# Patient Record
Sex: Female | Born: 1980
Health system: Southern US, Community
[De-identification: ages and names within clinical notes are randomized; demographics above are authoritative.]

## PROBLEM LIST (undated history)

## (undated) ENCOUNTER — Inpatient Hospital Stay (HOSPITAL_COMMUNITY): Payer: Self-pay

## (undated) DIAGNOSIS — G8929 Other chronic pain: Secondary | ICD-10-CM

## (undated) DIAGNOSIS — I499 Cardiac arrhythmia, unspecified: Secondary | ICD-10-CM

## (undated) DIAGNOSIS — M545 Low back pain, unspecified: Secondary | ICD-10-CM

## (undated) DIAGNOSIS — J45909 Unspecified asthma, uncomplicated: Secondary | ICD-10-CM

## (undated) DIAGNOSIS — F32A Depression, unspecified: Secondary | ICD-10-CM

## (undated) DIAGNOSIS — Z9889 Other specified postprocedural states: Secondary | ICD-10-CM

## (undated) DIAGNOSIS — D759 Disease of blood and blood-forming organs, unspecified: Secondary | ICD-10-CM

## (undated) DIAGNOSIS — Z8489 Family history of other specified conditions: Secondary | ICD-10-CM

## (undated) DIAGNOSIS — D6800 Von Willebrand disease, unspecified: Secondary | ICD-10-CM

## (undated) DIAGNOSIS — J189 Pneumonia, unspecified organism: Secondary | ICD-10-CM

## (undated) DIAGNOSIS — M1990 Unspecified osteoarthritis, unspecified site: Secondary | ICD-10-CM

## (undated) DIAGNOSIS — F329 Major depressive disorder, single episode, unspecified: Secondary | ICD-10-CM

## (undated) DIAGNOSIS — K649 Unspecified hemorrhoids: Secondary | ICD-10-CM

## (undated) DIAGNOSIS — F988 Other specified behavioral and emotional disorders with onset usually occurring in childhood and adolescence: Secondary | ICD-10-CM

## (undated) DIAGNOSIS — R351 Nocturia: Secondary | ICD-10-CM

## (undated) DIAGNOSIS — R112 Nausea with vomiting, unspecified: Secondary | ICD-10-CM

## (undated) DIAGNOSIS — R51 Headache: Secondary | ICD-10-CM

## (undated) DIAGNOSIS — K219 Gastro-esophageal reflux disease without esophagitis: Secondary | ICD-10-CM

## (undated) DIAGNOSIS — R519 Headache, unspecified: Secondary | ICD-10-CM

## (undated) DIAGNOSIS — F319 Bipolar disorder, unspecified: Secondary | ICD-10-CM

## (undated) DIAGNOSIS — D649 Anemia, unspecified: Secondary | ICD-10-CM

## (undated) DIAGNOSIS — D689 Coagulation defect, unspecified: Secondary | ICD-10-CM

## (undated) DIAGNOSIS — I10 Essential (primary) hypertension: Secondary | ICD-10-CM

## (undated) DIAGNOSIS — G35 Multiple sclerosis: Secondary | ICD-10-CM

## (undated) DIAGNOSIS — F419 Anxiety disorder, unspecified: Secondary | ICD-10-CM

## (undated) HISTORY — PX: LAPAROSCOPIC GASTRIC SLEEVE RESECTION: SHX5895

## (undated) HISTORY — PX: ABDOMINAL HYSTERECTOMY: SHX81

## (undated) HISTORY — PX: COLONOSCOPY: SHX174

## (undated) HISTORY — DX: Coagulation defect, unspecified: D68.9

## (undated) HISTORY — PX: MULTIPLE TOOTH EXTRACTIONS: SHX2053

## (undated) HISTORY — DX: Essential (primary) hypertension: I10

## (undated) HISTORY — PX: DILATION AND CURETTAGE OF UTERUS: SHX78

## (undated) HISTORY — PX: LUMBAR DISC SURGERY: SHX700

## (undated) HISTORY — PX: BACK SURGERY: SHX140

## (undated) HISTORY — DX: Unspecified osteoarthritis, unspecified site: M19.90

## (undated) NOTE — *Deleted (*Deleted)
HEMATOLOGY/ONCOLOGY CLINIC NOTE  Date of Service: 02/20/2020  Patient Care Team: Courtney Paris, NP as PCP - General (Nurse Practitioner)  CHIEF COMPLAINTS/PURPOSE OF CONSULTATION:  Von Willebrand's disease  HISTORY OF PRESENTING ILLNESS:   Ann [name] is a wonderful 75 y.o. female who has been referred to [place] by Dr Courtney Paris for evaluation and management of VonWillebrand's disease type 1A. The pt reports that she is doing well overall.   The pt reports that she was recently diagnosed with Multiple Sclerosis. She was experiencing slurred speech and went to the hospital. They then put her on 5 days of steroids and her symptoms subsided. She still experiences speech issues, body aches and fatigue occasionally. Her current Neurologist is Dr. Epimenio Foot at [facility], who plans on putting her on infusion therapy.   Her mother has bleeding issues and bruises frequently and one of her mother's siblings has similar issues. Neither of her parents have been tested for Von Willebrand's disease. She was diagnosed with Von Willebrand's in 2005 due to C-section related excess bleeding. Since she was younger she had excess nose bleeds before her menstruation, which improved after her cycle was over. She began birth control soon after she began mensturating but it did not help the bleeding. She remained on birth control until she was 4. She has been pregnant 3 times and has two children, one pregnancy ended in a planned abortion. Both of her children were delivered by C-section. Both have also been tested for Von Willebrand's disease. Her daughter tested positive and her son tested negative but only blood tests were used to diagnose. Her son does have sickle cell trait. She did not have any excessive bleeding with her second C-section, gallbladder surgery or back surgery. She has not required Stimate outside of surgery nor Amicar. Pt had a D&C/ablation in 2013 and has had controlled  bleeding since. Her daughter had an extraction without Stimate and did not have any excessive bleeding issues. In 2017 pt received a laceration from a pencil sharpener, bled for 6 hours and needed stiches for her injuries. She has no upcoming surgeries.   Most recent lab results (01/01/2019) of CBC is as follows: all values are WNL except for Lymphs Abs at 3.3K, BUN/Creatinine Ratio at 8, Sodium at 150, Chloride at 109, CO2 at 18. 01/01/2019 Urinalysis shows all values are WNL except for pH at 8.5.  01/01/2019 hCG is negative.  01/01/2019 Hepatitis B Core AB is negative.  01/01/2019 IgG, IgA, IgM shows all results are WNL.   On review of systems, pt reports fatigue, body aches, speech issues and denies abdominal pain and any other symptoms.   On PMHx the pt reports two C-sections, back surgery, gallbladder surgery, D&C ablation On Social Hx the pt reports no smoking, low alcohol use  On Family Hx the pt reports no diagnosed Von Willebrand's in mother or father, daughter diagnosed with Von Willebrand's   INTERVAL HISTORY:  Ann [name] is a wonderful 31 y.o. female who is here for evaluation and management of Von Willebrand's disease. The patient's last visit with [place] was on 02/06/2019. The pt reports that she is doing well overall.  The pt reports ***  Of note since the patient's last visit, pt has had *** completed on *** with results revealing ***.  Lab results today (02/20/20) of CBC w/diff and CMP is as follows: all values are WNL except for ***.  On review of systems, pt reports *** and denies ***  and any other symptoms.   A&P: -Discussed pt labwork today, 02/20/20; *** -***  MEDICAL HISTORY:  Past Medical History:  Diagnosis Date  . ADD (attention deficit disorder)    takes Adderall daily  . Anemia   . Anxiety    takes Xanax daily as needed  . Asthma    mild case per pt  . Chronic lower back pain   . Depression    has meds prescribed but doesn't take them  .  Family history of adverse reaction to anesthesia    "Mom gets PONV too"  . Headache    "monthly" (02/27/2015)  . Hemorrhoids   . History of esophagogastroduodenoscopy (EGD)   . Migraine    "none in the last 6 months" (02/27/2015)  . Nocturia   . Pneumonia "several times"  . PONV (postoperative nausea and vomiting)   . Von Willebrand disease (HCC)    Dr. Cyndie Chime    SURGICAL HISTORY: Past Surgical History:  Procedure Laterality Date  . BACK SURGERY    . BREAST CYST EXCISION Left 02/20/2018   Procedure: EXCISION OF LEFT BREAST SEBACEOUS CYST;  Surgeon: Emelia Loron, MD;  Location: [facility];  Service: General;  Laterality: Left;  . BREAST REDUCTION SURGERY Bilateral 02/27/2015   Procedure: BILATERAL BREAST REDUCTION WITH FREE NIPPLE GRAFT TECHNIQUE ON RIGHT BREAST;  Surgeon: Etter Sjogren, MD;  Location: [facility] OR;  Service: Plastics;  Laterality: Bilateral;  . CESAREAN SECTION  2004; 2013  . CHOLECYSTECTOMY  03/02/2012   Procedure: LAPAROSCOPIC CHOLECYSTECTOMY WITH INTRAOPERATIVE CHOLANGIOGRAM;  Surgeon: Currie Paris, MD;  Location: [facility] OR;  Service: General;  Laterality: N/A;  . DILATION AND CURETTAGE OF UTERUS  ~ 1997; ~ 2005  . DILITATION & CURRETTAGE/HYSTROSCOPY WITH THERMACHOICE ABLATION  04/28/2012   Procedure: DILATATION & CURETTAGE/HYSTEROSCOPY WITH THERMACHOICE ABLATION;  Surgeon: Jeani Hawking, MD;  Location: [facility];  Service: Gynecology;  Laterality: N/A;  . ERCP  03/03/2012   Procedure: ENDOSCOPIC RETROGRADE CHOLANGIOPANCREATOGRAPHY (ERCP);  Surgeon: Theda Belfast, MD;  Location: [facility];  Service: Endoscopy;  Laterality: N/A;  dl/hung  . LAPAROSCOPY  2011  . LUMBAR DISC SURGERY  2003; 2008; 2009  . MULTIPLE TOOTH EXTRACTIONS  "scattered dates"  . POSTERIOR LUMBAR FUSION  2010  . REDUCTION MAMMAPLASTY Bilateral 02/27/2015  . TUBAL LIGATION  2013  . WISDOM TOOTH EXTRACTION  2001    SOCIAL HISTORY: Social History   Socioeconomic  History  . Marital status: Married    Spouse name: Not on file  . Number of children: Not on file  . Years of education: Not on file  . Highest education level: Not on file  Occupational History  . Occupation: regulatory affairs  Tobacco Use  . Smoking status: Former Smoker    Packs/day: 0.25    Years: 2.00    Pack years: 0.50    Types: Cigarettes    Quit date: 2000    Years since quitting: 21.7  . Smokeless tobacco: Never Used  Substance and Sexual Activity  . Alcohol use: Yes    Alcohol/week: 0.0 standard drinks    Comment: social  . Drug use: Yes    Types: Marijuana    Comment: occasional use, last use last week  . Sexual activity: Yes    Birth control/protection: None    Comment: BTL  Other Topics Concern  . Not on file  Social History Narrative   Lives with husband and kids   Caffeine use:    16 oz per  day   Right handed   Social Determinants of Health   Financial Resource Strain:   . Difficulty of Paying Living Expenses: Not on file  Food Insecurity:   . Worried About Programme researcher, broadcasting/film/video in the Last Year: Not on file  . Ran Out of Food in the Last Year: Not on file  Transportation Needs:   . Lack of Transportation (Medical): Not on file  . Lack of Transportation (Non-Medical): Not on file  Physical Activity:   . Days of Exercise per Week: Not on file  . Minutes of Exercise per Session: Not on file  Stress:   . Feeling of Stress : Not on file  Social Connections:   . Frequency of Communication with Friends and Family: Not on file  . Frequency of Social Gatherings with Friends and Family: Not on file  . Attends Religious Services: Not on file  . Active Member of Clubs or Organizations: Not on file  . Attends Banker Meetings: Not on file  . Marital Status: Not on file  Intimate Partner Violence:   . Fear of Current or Ex-Partner: Not on file  . Emotionally Abused: Not on file  . Physically Abused: Not on file  . Sexually Abused: Not on  file    FAMILY HISTORY: Family History  Problem Relation Age of Onset  . Stroke Mother   . Arthritis Mother   . Depression Mother   . COPD Father   . Kidney disease Paternal Uncle   . Stroke Maternal Grandfather   . Heart disease Paternal Grandfather   . Multiple sclerosis Neg Hx     ALLERGIES:  is allergic to compazine [prochlorperazine edisylate], darvocet [propoxyphene n-acetaminophen], codeine, and sulfa antibiotics.  MEDICATIONS:  Current Outpatient Medications  Medication Sig Dispense Refill  . ALPRAZolam (XANAX) 1 MG tablet Take 0.5 mg by mouth 2 (two) times daily as needed for anxiety.     . Armodafinil 200 MG TABS One po qAM 30 tablet 3  . baclofen (LIORESAL) 10 MG tablet 1/2-1 tablet by mouth three times daily 90 tablet 5  . BIOTIN PO Take 1 Dose by mouth daily.    . Cyanocobalamin (B-12 PO) Take 1 Dose by mouth daily.    Marland Kitchen FLUoxetine (PROZAC) 40 MG capsule Take 40 mg by mouth at bedtime.    . hydrOXYzine (ATARAX/VISTARIL) 10 MG tablet Take 1 tablet (10 mg total) by mouth 3 (three) times daily as needed. 90 tablet 5  . meloxicam (MOBIC) 7.5 MG tablet Take 1 tablet (7.5 mg total) by mouth daily. 30 tablet 5  . phentermine (ADIPEX-P) 37.5 MG tablet Take 1 tablet (37.5 mg total) by mouth daily before breakfast. 30 tablet 5  . Vitamin D, Ergocalciferol, (DRISDOL) 1.25 MG (50000 UT) CAPS capsule Take 50,000 Units by mouth every 7 (seven) days.     No current facility-administered medications for this visit.    REVIEW OF SYSTEMS:   A 10+ POINT REVIEW OF SYSTEMS WAS OBTAINED including neurology, dermatology, psychiatry, cardiac, respiratory, lymph, extremities, GI, GU, Musculoskeletal, constitutional, breasts, reproductive, HEENT.  All pertinent positives are noted in the HPI.  All others are negative.   PHYSICAL EXAMINATION: ECOG PERFORMANCE STATUS: 1 - Symptomatic but completely ambulatory  . There were no vitals filed for this visit. There were no vitals filed for  this visit. .There is no height or weight on file to calculate BMI.  *** GENERAL:alert, in no acute distress and comfortable SKIN: no acute rashes, no significant  lesions EYES: conjunctiva are pink and non-injected, sclera anicteric OROPHARYNX: MMM, no exudates, no oropharyngeal erythema or ulceration NECK: supple, no JVD LYMPH:  no palpable lymphadenopathy in the cervical, axillary or inguinal regions LUNGS: clear to auscultation b/l with normal respiratory effort HEART: regular rate & rhythm ABDOMEN:  normoactive bowel sounds , non tender, not distended. No palpable hepatosplenomegaly.  Extremity: no pedal edema PSYCH: alert & oriented x 3 with fluent speech NEURO: no focal motor/sensory deficits  LABORATORY DATA:  I have reviewed the data as listed  . CBC Latest Ref Rng & Units 01/16/2019 01/01/2019 12/12/2018  WBC 4.0 - 10.5 K/uL 7.9 9.0 -  Hemoglobin 12.0 - 15.0 g/dL 16.1 09.6 04.5  Hematocrit 36 - 46 % 38.5 37.0 42.0  Platelets 150 - 400 K/uL 319 250 -    . CMP Latest Ref Rng & Units 01/16/2019 01/01/2019 12/12/2018  Glucose 70 - 99 mg/dL 92 88 90  BUN 6 - 20 mg/dL 8 7 7   Creatinine 0.44 - 1.00 mg/dL 4.09 8.11 9.14  Sodium 135 - 145 mmol/L 140 150(H) 141  Potassium 3.5 - 5.1 mmol/L 4.0 4.5 3.7  Chloride 98 - 111 mmol/L 106 109(H) 106  CO2 22 - 32 mmol/L 26 18(L) -  Calcium 8.9 - 10.3 mg/dL 9.5 78.2 -  Total Protein 6.5 - 8.1 g/dL 7.6 7.6 -  Total Bilirubin 0.3 - 1.2 mg/dL 0.4 <9.5 -  Alkaline Phos 38 - 126 U/L 59 75 -  AST 15 - 41 U/L 15 17 -  ALT 0 - 44 U/L 14 19 -     RADIOGRAPHIC STUDIES: I have personally reviewed the radiological images as listed and agreed with the findings in the report. MR BRAIN W WO CONTRAST  Result Date: 02/06/2020  [facility] [address], [address] [place], [place] [address] [phone] NEUROIMAGING REPORT STUDY DATE: 02/05/2020 PATIENT NAME: Ann [name] DOB: 1980/06/18 MRN: [id number] EXAM: MRI Brain with and without  contrast  ORDERING CLINICIAN: Richard A. Epimenio Foot, MD. PhD CLINICAL HISTORY:84 year old woman with multiple sclerosis in the CHIMES study COMPARISON FILMS: MRI 08/16/2019  TECHNIQUE:MRI of the brain with and without contrast was obtained utilizing the CHIMES MS protocol CONTRAST: 20 mL ProHance IMAGING SITE: [employer], [address] [place].  FINDINGS: The contents of the posterior fossa are of normal size and position. The pituitary gland and optic chiasm appear normal. Visible spinal cord appears normal.Brain volume appears normal. The ventricles are normal in size and without distortion. There are no abnormal extra-axial collections of fluid.   In the hemispheres, there are multiple T2/FLAIR hyperintense foci in the periventricular and deep white matter.  Wallerian degeneration is seen along the left corticospinal tract.  The cerebellum and brainstem appears normal. The deep gray matter appears normal. None of the foci appears to be acute and they do not enhance. There are no new lesions compared to the 08/16/2019 MRI.   The orbits appear normal. The VIIth/VIIIth nerve complex appears normal. The mastoid air cells appear normal. The paranasal sinuses appear normal. Flow voids are identified within the major intracerebral arteries.   After the infusion of contrast material, a normal enhancement pattern is noted.  Additional images cover the spinal cord on sagittal views.  No foci are noted.  No enhancing lesions.  Disc protrusion at T1-T2.  There are no changes compared to the 08/16/2019 MRI of the cervical spine.  IMPRESSION: This MRI of the brain with and without contrast performed with the CHIMES MS  study protocol shows the following: 1.     There are multiple T2/FLAIR hyperintense foci in the hemispheres in a pattern and configuration consistent with chronic demyelinating plaque associated with multiple sclerosis.    Additionally, wallerian degeneration is seen along the left  corticospinal tract.  Compared to the MRI from 08/16/2019, there are no new lesions.  The foci do not enhance.  2.   There is a normal enhancement pattern and no acute findings. INTERPRETING PHYSICIAN: Richard A. Epimenio Foot, MD, PhD, FAAN Certified in  Neuroimaging by [employer]    ASSESSMENT & PLAN:    69 yo with h/o VWD type 1 and recently diagnosed MS  1) Von Willebrand disease  -Most likely Type 2 (M or N) Von Willebrand disease based on labs ***  PLAN: *** -Recommended avoidance of OTC pain medications, NSAIDS, alcohol use due to increased bleeding risks. -Discussed that certain MS medications could decrease platelet counts. -Has been a stimate responder in the past and has needed this with previous C-section and gallbladder surgery. -Amicar with dental procedures   FOLLOW UP: ***   The total time spent in the appt was *** minutes and more than 50% was on counseling and direct patient cares.  All of the patient's questions were answered with apparent satisfaction. The patient knows to call the clinic with any problems, questions or concerns.    Wyvonnia Lora MD MS AAHIVMS [facility] Hematology/Oncology Physician [facility]  (Office):       [phone] (Work cell):  [phone] (Fax):           [phone]  02/20/2020 8:04 AM  I, Carollee Herter, am acting as a scribe for Dr. Wyvonnia Lora.   {Add Production assistant, radio Statement}

---

## 1998-01-16 ENCOUNTER — Emergency Department (HOSPITAL_COMMUNITY): Admission: EM | Admit: 1998-01-16 | Discharge: 1998-01-16 | Payer: Self-pay | Admitting: Emergency Medicine

## 1998-03-29 ENCOUNTER — Emergency Department (HOSPITAL_COMMUNITY): Admission: EM | Admit: 1998-03-29 | Discharge: 1998-03-29 | Payer: Self-pay | Admitting: Emergency Medicine

## 1999-03-02 ENCOUNTER — Emergency Department (HOSPITAL_COMMUNITY): Admission: EM | Admit: 1999-03-02 | Discharge: 1999-03-02 | Payer: Self-pay | Admitting: Family Medicine

## 1999-05-11 HISTORY — PX: WISDOM TOOTH EXTRACTION: SHX21

## 2000-11-06 ENCOUNTER — Emergency Department (HOSPITAL_COMMUNITY): Admission: EM | Admit: 2000-11-06 | Discharge: 2000-11-06 | Payer: Self-pay | Admitting: Emergency Medicine

## 2001-02-15 ENCOUNTER — Encounter: Payer: Self-pay | Admitting: Emergency Medicine

## 2001-02-15 ENCOUNTER — Emergency Department (HOSPITAL_COMMUNITY): Admission: EM | Admit: 2001-02-15 | Discharge: 2001-02-16 | Payer: Self-pay | Admitting: Emergency Medicine

## 2001-06-07 ENCOUNTER — Emergency Department (HOSPITAL_COMMUNITY): Admission: EM | Admit: 2001-06-07 | Discharge: 2001-06-07 | Payer: Self-pay | Admitting: Emergency Medicine

## 2001-06-14 ENCOUNTER — Emergency Department (HOSPITAL_COMMUNITY): Admission: EM | Admit: 2001-06-14 | Discharge: 2001-06-14 | Payer: Self-pay | Admitting: *Deleted

## 2001-06-21 ENCOUNTER — Ambulatory Visit (HOSPITAL_COMMUNITY): Admission: RE | Admit: 2001-06-21 | Discharge: 2001-06-21 | Payer: Self-pay

## 2001-06-21 ENCOUNTER — Encounter: Payer: Self-pay | Admitting: Family Medicine

## 2001-07-18 ENCOUNTER — Encounter: Admission: RE | Admit: 2001-07-18 | Discharge: 2001-07-18 | Payer: Self-pay | Admitting: Neurosurgery

## 2001-07-18 ENCOUNTER — Encounter: Payer: Self-pay | Admitting: Neurosurgery

## 2001-08-01 ENCOUNTER — Encounter: Payer: Self-pay | Admitting: Neurosurgery

## 2001-08-01 ENCOUNTER — Encounter: Admission: RE | Admit: 2001-08-01 | Discharge: 2001-08-01 | Payer: Self-pay | Admitting: Neurosurgery

## 2001-09-12 ENCOUNTER — Inpatient Hospital Stay (HOSPITAL_COMMUNITY): Admission: RE | Admit: 2001-09-12 | Discharge: 2001-09-16 | Payer: Self-pay | Admitting: Neurosurgery

## 2001-09-12 ENCOUNTER — Encounter: Payer: Self-pay | Admitting: Neurosurgery

## 2001-12-15 ENCOUNTER — Encounter: Admission: RE | Admit: 2001-12-15 | Discharge: 2002-01-25 | Payer: Self-pay | Admitting: Neurosurgery

## 2002-05-22 ENCOUNTER — Encounter: Admission: RE | Admit: 2002-05-22 | Discharge: 2002-08-20 | Payer: Self-pay

## 2002-07-06 ENCOUNTER — Other Ambulatory Visit: Admission: RE | Admit: 2002-07-06 | Discharge: 2002-07-06 | Payer: Self-pay | Admitting: Obstetrics and Gynecology

## 2002-11-07 ENCOUNTER — Encounter
Admission: RE | Admit: 2002-11-07 | Discharge: 2003-02-05 | Payer: Self-pay | Admitting: Physical Medicine & Rehabilitation

## 2002-12-25 ENCOUNTER — Inpatient Hospital Stay (HOSPITAL_COMMUNITY): Admission: AD | Admit: 2002-12-25 | Discharge: 2002-12-25 | Payer: Self-pay | Admitting: Obstetrics and Gynecology

## 2002-12-25 ENCOUNTER — Encounter: Payer: Self-pay | Admitting: Obstetrics and Gynecology

## 2003-01-08 ENCOUNTER — Inpatient Hospital Stay (HOSPITAL_COMMUNITY): Admission: AD | Admit: 2003-01-08 | Discharge: 2003-01-08 | Payer: Self-pay | Admitting: Obstetrics and Gynecology

## 2003-01-29 ENCOUNTER — Inpatient Hospital Stay (HOSPITAL_COMMUNITY): Admission: AD | Admit: 2003-01-29 | Discharge: 2003-01-29 | Payer: Self-pay | Admitting: Obstetrics and Gynecology

## 2003-03-11 ENCOUNTER — Inpatient Hospital Stay (HOSPITAL_COMMUNITY): Admission: AD | Admit: 2003-03-11 | Discharge: 2003-03-12 | Payer: Self-pay | Admitting: Obstetrics and Gynecology

## 2003-03-19 ENCOUNTER — Inpatient Hospital Stay (HOSPITAL_COMMUNITY): Admission: AD | Admit: 2003-03-19 | Discharge: 2003-03-19 | Payer: Self-pay | Admitting: Obstetrics and Gynecology

## 2003-03-20 ENCOUNTER — Inpatient Hospital Stay (HOSPITAL_COMMUNITY): Admission: AD | Admit: 2003-03-20 | Discharge: 2003-03-20 | Payer: Self-pay | Admitting: Obstetrics and Gynecology

## 2003-03-21 ENCOUNTER — Inpatient Hospital Stay (HOSPITAL_COMMUNITY): Admission: AD | Admit: 2003-03-21 | Discharge: 2003-03-26 | Payer: Self-pay | Admitting: Obstetrics and Gynecology

## 2003-03-28 ENCOUNTER — Inpatient Hospital Stay (HOSPITAL_COMMUNITY): Admission: AD | Admit: 2003-03-28 | Discharge: 2003-03-28 | Payer: Self-pay | Admitting: Obstetrics and Gynecology

## 2003-04-03 ENCOUNTER — Inpatient Hospital Stay (HOSPITAL_COMMUNITY): Admission: AD | Admit: 2003-04-03 | Discharge: 2003-04-03 | Payer: Self-pay | Admitting: Obstetrics and Gynecology

## 2003-05-11 ENCOUNTER — Inpatient Hospital Stay (HOSPITAL_COMMUNITY): Admission: AD | Admit: 2003-05-11 | Discharge: 2003-05-11 | Payer: Self-pay | Admitting: Obstetrics and Gynecology

## 2003-05-15 ENCOUNTER — Encounter
Admission: RE | Admit: 2003-05-15 | Discharge: 2003-08-13 | Payer: Self-pay | Admitting: Physical Medicine & Rehabilitation

## 2003-06-26 ENCOUNTER — Emergency Department (HOSPITAL_COMMUNITY): Admission: EM | Admit: 2003-06-26 | Discharge: 2003-06-26 | Payer: Self-pay | Admitting: Emergency Medicine

## 2003-07-26 ENCOUNTER — Encounter
Admission: RE | Admit: 2003-07-26 | Discharge: 2003-10-24 | Payer: Self-pay | Admitting: Physical Medicine & Rehabilitation

## 2003-08-22 ENCOUNTER — Encounter
Admission: RE | Admit: 2003-08-22 | Discharge: 2003-11-20 | Payer: Self-pay | Admitting: Physical Medicine & Rehabilitation

## 2003-11-18 ENCOUNTER — Emergency Department (HOSPITAL_COMMUNITY): Admission: EM | Admit: 2003-11-18 | Discharge: 2003-11-18 | Payer: Self-pay | Admitting: Family Medicine

## 2003-11-26 ENCOUNTER — Emergency Department (HOSPITAL_COMMUNITY): Admission: EM | Admit: 2003-11-26 | Discharge: 2003-11-26 | Payer: Self-pay | Admitting: Family Medicine

## 2003-12-12 ENCOUNTER — Encounter
Admission: RE | Admit: 2003-12-12 | Discharge: 2004-02-03 | Payer: Self-pay | Admitting: Physical Medicine & Rehabilitation

## 2004-01-29 ENCOUNTER — Emergency Department (HOSPITAL_COMMUNITY): Admission: EM | Admit: 2004-01-29 | Discharge: 2004-01-29 | Payer: Self-pay | Admitting: Emergency Medicine

## 2004-01-30 ENCOUNTER — Emergency Department (HOSPITAL_COMMUNITY): Admission: EM | Admit: 2004-01-30 | Discharge: 2004-01-30 | Payer: Self-pay | Admitting: *Deleted

## 2004-02-01 ENCOUNTER — Emergency Department (HOSPITAL_COMMUNITY): Admission: EM | Admit: 2004-02-01 | Discharge: 2004-02-02 | Payer: Self-pay | Admitting: Emergency Medicine

## 2004-02-03 ENCOUNTER — Encounter
Admission: RE | Admit: 2004-02-03 | Discharge: 2004-05-03 | Payer: Self-pay | Admitting: Physical Medicine & Rehabilitation

## 2004-02-21 ENCOUNTER — Encounter: Admission: RE | Admit: 2004-02-21 | Discharge: 2004-03-13 | Payer: Self-pay | Admitting: Obstetrics and Gynecology

## 2004-02-25 ENCOUNTER — Ambulatory Visit: Payer: Self-pay | Admitting: Physical Medicine & Rehabilitation

## 2004-05-12 ENCOUNTER — Encounter
Admission: RE | Admit: 2004-05-12 | Discharge: 2004-08-10 | Payer: Self-pay | Admitting: Physical Medicine & Rehabilitation

## 2004-05-14 ENCOUNTER — Ambulatory Visit: Payer: Self-pay | Admitting: Physical Medicine & Rehabilitation

## 2004-05-25 ENCOUNTER — Emergency Department (HOSPITAL_COMMUNITY): Admission: EM | Admit: 2004-05-25 | Discharge: 2004-05-25 | Payer: Self-pay | Admitting: Family Medicine

## 2004-07-06 ENCOUNTER — Inpatient Hospital Stay (HOSPITAL_COMMUNITY): Admission: AD | Admit: 2004-07-06 | Discharge: 2004-07-06 | Payer: Self-pay | Admitting: Obstetrics and Gynecology

## 2004-07-06 ENCOUNTER — Encounter: Admission: RE | Admit: 2004-07-06 | Discharge: 2004-10-04 | Payer: Self-pay | Admitting: Obstetrics and Gynecology

## 2004-07-08 ENCOUNTER — Inpatient Hospital Stay (HOSPITAL_COMMUNITY): Admission: AD | Admit: 2004-07-08 | Discharge: 2004-07-08 | Payer: Self-pay | Admitting: Obstetrics and Gynecology

## 2004-09-29 ENCOUNTER — Other Ambulatory Visit: Admission: RE | Admit: 2004-09-29 | Discharge: 2004-09-29 | Payer: Self-pay | Admitting: Obstetrics and Gynecology

## 2004-10-10 ENCOUNTER — Emergency Department (HOSPITAL_COMMUNITY): Admission: EM | Admit: 2004-10-10 | Discharge: 2004-10-10 | Payer: Self-pay | Admitting: Emergency Medicine

## 2004-10-15 ENCOUNTER — Ambulatory Visit (HOSPITAL_COMMUNITY): Admission: RE | Admit: 2004-10-15 | Discharge: 2004-10-15 | Payer: Self-pay | Admitting: Family Medicine

## 2004-10-15 ENCOUNTER — Encounter (INDEPENDENT_AMBULATORY_CARE_PROVIDER_SITE_OTHER): Payer: Self-pay | Admitting: *Deleted

## 2004-10-15 ENCOUNTER — Ambulatory Visit (HOSPITAL_COMMUNITY): Admission: RE | Admit: 2004-10-15 | Discharge: 2004-10-15 | Payer: Self-pay | Admitting: Vascular Surgery

## 2004-10-18 ENCOUNTER — Inpatient Hospital Stay (HOSPITAL_COMMUNITY): Admission: EM | Admit: 2004-10-18 | Discharge: 2004-10-26 | Payer: Self-pay | Admitting: Emergency Medicine

## 2004-10-23 ENCOUNTER — Encounter: Payer: Self-pay | Admitting: Internal Medicine

## 2004-12-22 ENCOUNTER — Emergency Department (HOSPITAL_COMMUNITY): Admission: EM | Admit: 2004-12-22 | Discharge: 2004-12-22 | Payer: Self-pay | Admitting: Emergency Medicine

## 2004-12-23 ENCOUNTER — Inpatient Hospital Stay (HOSPITAL_COMMUNITY): Admission: AD | Admit: 2004-12-23 | Discharge: 2004-12-23 | Payer: Self-pay | Admitting: Obstetrics and Gynecology

## 2005-01-26 ENCOUNTER — Emergency Department (HOSPITAL_COMMUNITY): Admission: EM | Admit: 2005-01-26 | Discharge: 2005-01-27 | Payer: Self-pay | Admitting: Emergency Medicine

## 2005-01-27 ENCOUNTER — Emergency Department (HOSPITAL_COMMUNITY): Admission: EM | Admit: 2005-01-27 | Discharge: 2005-01-27 | Payer: Self-pay | Admitting: Family Medicine

## 2005-05-19 ENCOUNTER — Emergency Department (HOSPITAL_COMMUNITY): Admission: EM | Admit: 2005-05-19 | Discharge: 2005-05-19 | Payer: Self-pay | Admitting: Family Medicine

## 2005-06-24 ENCOUNTER — Other Ambulatory Visit: Admission: RE | Admit: 2005-06-24 | Discharge: 2005-06-24 | Payer: Self-pay | Admitting: Obstetrics and Gynecology

## 2005-07-03 ENCOUNTER — Emergency Department (HOSPITAL_COMMUNITY): Admission: EM | Admit: 2005-07-03 | Discharge: 2005-07-03 | Payer: Self-pay | Admitting: Family Medicine

## 2005-11-11 ENCOUNTER — Emergency Department (HOSPITAL_COMMUNITY): Admission: EM | Admit: 2005-11-11 | Discharge: 2005-11-11 | Payer: Self-pay | Admitting: Emergency Medicine

## 2005-12-22 ENCOUNTER — Ambulatory Visit (HOSPITAL_COMMUNITY): Admission: RE | Admit: 2005-12-22 | Discharge: 2005-12-22 | Payer: Self-pay | Admitting: Emergency Medicine

## 2006-04-27 ENCOUNTER — Other Ambulatory Visit: Admission: RE | Admit: 2006-04-27 | Discharge: 2006-04-27 | Payer: Self-pay | Admitting: Obstetrics and Gynecology

## 2006-08-18 ENCOUNTER — Ambulatory Visit (HOSPITAL_COMMUNITY): Admission: RE | Admit: 2006-08-18 | Discharge: 2006-08-18 | Payer: Self-pay | Admitting: Gastroenterology

## 2006-09-06 ENCOUNTER — Emergency Department (HOSPITAL_COMMUNITY): Admission: EM | Admit: 2006-09-06 | Discharge: 2006-09-06 | Payer: Self-pay | Admitting: Emergency Medicine

## 2006-11-02 ENCOUNTER — Other Ambulatory Visit: Admission: RE | Admit: 2006-11-02 | Discharge: 2006-11-02 | Payer: Self-pay | Admitting: Obstetrics and Gynecology

## 2006-11-22 ENCOUNTER — Emergency Department (HOSPITAL_COMMUNITY): Admission: EM | Admit: 2006-11-22 | Discharge: 2006-11-22 | Payer: Self-pay | Admitting: Emergency Medicine

## 2006-11-24 ENCOUNTER — Emergency Department (HOSPITAL_COMMUNITY): Admission: EM | Admit: 2006-11-24 | Discharge: 2006-11-25 | Payer: Self-pay | Admitting: Emergency Medicine

## 2006-11-29 ENCOUNTER — Ambulatory Visit (HOSPITAL_COMMUNITY): Admission: RE | Admit: 2006-11-29 | Discharge: 2006-11-29 | Payer: Self-pay | Admitting: Family Medicine

## 2006-12-07 ENCOUNTER — Encounter: Admission: RE | Admit: 2006-12-07 | Discharge: 2006-12-07 | Payer: Self-pay | Admitting: Family Medicine

## 2007-01-17 ENCOUNTER — Emergency Department (HOSPITAL_COMMUNITY): Admission: EM | Admit: 2007-01-17 | Discharge: 2007-01-17 | Payer: Self-pay | Admitting: Emergency Medicine

## 2007-02-17 ENCOUNTER — Ambulatory Visit (HOSPITAL_COMMUNITY): Payer: Self-pay | Admitting: Psychiatry

## 2007-02-27 ENCOUNTER — Emergency Department (HOSPITAL_COMMUNITY): Admission: EM | Admit: 2007-02-27 | Discharge: 2007-02-27 | Payer: Self-pay | Admitting: Emergency Medicine

## 2007-03-08 ENCOUNTER — Ambulatory Visit (HOSPITAL_COMMUNITY): Admission: RE | Admit: 2007-03-08 | Discharge: 2007-03-08 | Payer: Self-pay | Admitting: Family Medicine

## 2007-03-21 ENCOUNTER — Emergency Department (HOSPITAL_COMMUNITY): Admission: EM | Admit: 2007-03-21 | Discharge: 2007-03-21 | Payer: Self-pay | Admitting: Podiatry

## 2007-03-23 ENCOUNTER — Ambulatory Visit (HOSPITAL_COMMUNITY): Payer: Self-pay | Admitting: Psychiatry

## 2007-04-18 ENCOUNTER — Emergency Department (HOSPITAL_COMMUNITY): Admission: EM | Admit: 2007-04-18 | Discharge: 2007-04-18 | Payer: Self-pay | Admitting: Emergency Medicine

## 2007-04-20 ENCOUNTER — Inpatient Hospital Stay (HOSPITAL_COMMUNITY): Admission: RE | Admit: 2007-04-20 | Discharge: 2007-04-29 | Payer: Self-pay | Admitting: Neurosurgery

## 2007-05-07 ENCOUNTER — Inpatient Hospital Stay (HOSPITAL_COMMUNITY): Admission: EM | Admit: 2007-05-07 | Discharge: 2007-05-09 | Payer: Self-pay | Admitting: Emergency Medicine

## 2007-05-13 ENCOUNTER — Ambulatory Visit: Payer: Self-pay | Admitting: Oncology

## 2007-05-13 ENCOUNTER — Inpatient Hospital Stay (HOSPITAL_COMMUNITY): Admission: EM | Admit: 2007-05-13 | Discharge: 2007-05-20 | Payer: Self-pay | Admitting: Emergency Medicine

## 2007-05-28 ENCOUNTER — Emergency Department (HOSPITAL_COMMUNITY): Admission: EM | Admit: 2007-05-28 | Discharge: 2007-05-28 | Payer: Self-pay | Admitting: Emergency Medicine

## 2007-05-29 ENCOUNTER — Inpatient Hospital Stay (HOSPITAL_COMMUNITY): Admission: EM | Admit: 2007-05-29 | Discharge: 2007-06-10 | Payer: Self-pay | Admitting: *Deleted

## 2007-06-11 ENCOUNTER — Emergency Department (HOSPITAL_COMMUNITY): Admission: EM | Admit: 2007-06-11 | Discharge: 2007-06-11 | Payer: Self-pay | Admitting: Emergency Medicine

## 2007-06-12 ENCOUNTER — Emergency Department (HOSPITAL_COMMUNITY): Admission: EM | Admit: 2007-06-12 | Discharge: 2007-06-13 | Payer: Self-pay | Admitting: Emergency Medicine

## 2007-06-26 ENCOUNTER — Other Ambulatory Visit: Admission: RE | Admit: 2007-06-26 | Discharge: 2007-06-26 | Payer: Self-pay | Admitting: Gynecology

## 2007-07-22 ENCOUNTER — Emergency Department (HOSPITAL_COMMUNITY): Admission: EM | Admit: 2007-07-22 | Discharge: 2007-07-22 | Payer: Self-pay | Admitting: Emergency Medicine

## 2007-07-23 ENCOUNTER — Other Ambulatory Visit: Payer: Self-pay | Admitting: Gynecology

## 2007-07-24 ENCOUNTER — Emergency Department (HOSPITAL_COMMUNITY): Admission: EM | Admit: 2007-07-24 | Discharge: 2007-07-24 | Payer: Self-pay | Admitting: Emergency Medicine

## 2007-07-28 ENCOUNTER — Inpatient Hospital Stay (HOSPITAL_COMMUNITY): Admission: EM | Admit: 2007-07-28 | Discharge: 2007-07-31 | Payer: Self-pay | Admitting: Emergency Medicine

## 2007-08-31 ENCOUNTER — Emergency Department (HOSPITAL_COMMUNITY): Admission: EM | Admit: 2007-08-31 | Discharge: 2007-09-01 | Payer: Self-pay | Admitting: Emergency Medicine

## 2007-09-01 ENCOUNTER — Encounter: Payer: Self-pay | Admitting: Emergency Medicine

## 2007-09-01 ENCOUNTER — Inpatient Hospital Stay (HOSPITAL_COMMUNITY): Admission: EM | Admit: 2007-09-01 | Discharge: 2007-09-10 | Payer: Self-pay | Admitting: Neurosurgery

## 2007-09-09 ENCOUNTER — Ambulatory Visit: Payer: Self-pay | Admitting: Internal Medicine

## 2007-09-27 ENCOUNTER — Encounter: Admission: RE | Admit: 2007-09-27 | Discharge: 2007-09-27 | Payer: Self-pay | Admitting: Neurosurgery

## 2007-11-11 ENCOUNTER — Emergency Department (HOSPITAL_COMMUNITY): Admission: EM | Admit: 2007-11-11 | Discharge: 2007-11-11 | Payer: Self-pay | Admitting: Emergency Medicine

## 2007-11-23 ENCOUNTER — Ambulatory Visit (HOSPITAL_COMMUNITY): Admission: RE | Admit: 2007-11-23 | Discharge: 2007-11-23 | Payer: Self-pay | Admitting: Anesthesiology

## 2007-12-25 ENCOUNTER — Other Ambulatory Visit: Admission: RE | Admit: 2007-12-25 | Discharge: 2007-12-25 | Payer: Self-pay | Admitting: Gynecology

## 2008-03-02 ENCOUNTER — Inpatient Hospital Stay (HOSPITAL_COMMUNITY): Admission: AD | Admit: 2008-03-02 | Discharge: 2008-03-02 | Payer: Self-pay | Admitting: Obstetrics & Gynecology

## 2008-03-14 ENCOUNTER — Ambulatory Visit: Payer: Self-pay | Admitting: Gynecology

## 2008-04-16 ENCOUNTER — Ambulatory Visit: Payer: Self-pay | Admitting: Gynecology

## 2008-04-24 ENCOUNTER — Ambulatory Visit: Payer: Self-pay | Admitting: Gynecology

## 2008-05-06 ENCOUNTER — Ambulatory Visit: Payer: Self-pay | Admitting: Gynecology

## 2008-05-08 ENCOUNTER — Ambulatory Visit: Payer: Self-pay | Admitting: Gynecology

## 2008-05-09 ENCOUNTER — Encounter: Payer: Self-pay | Admitting: Gynecology

## 2008-05-09 ENCOUNTER — Ambulatory Visit: Payer: Self-pay | Admitting: Gynecology

## 2008-05-09 ENCOUNTER — Ambulatory Visit (HOSPITAL_COMMUNITY): Admission: RE | Admit: 2008-05-09 | Discharge: 2008-05-09 | Payer: Self-pay | Admitting: Gynecology

## 2008-05-10 HISTORY — PX: POSTERIOR LUMBAR FUSION: SHX6036

## 2008-05-22 ENCOUNTER — Ambulatory Visit: Payer: Self-pay | Admitting: Gynecology

## 2008-05-30 ENCOUNTER — Ambulatory Visit: Payer: Self-pay | Admitting: Oncology

## 2008-06-10 ENCOUNTER — Ambulatory Visit: Payer: Self-pay | Admitting: Obstetrics and Gynecology

## 2008-06-10 ENCOUNTER — Encounter: Payer: Self-pay | Admitting: Obstetrics and Gynecology

## 2008-06-10 ENCOUNTER — Other Ambulatory Visit: Admission: RE | Admit: 2008-06-10 | Discharge: 2008-06-10 | Payer: Self-pay | Admitting: Obstetrics and Gynecology

## 2008-08-13 ENCOUNTER — Ambulatory Visit: Payer: Self-pay | Admitting: Obstetrics and Gynecology

## 2008-09-24 ENCOUNTER — Ambulatory Visit: Payer: Self-pay | Admitting: Obstetrics and Gynecology

## 2008-11-13 ENCOUNTER — Ambulatory Visit: Payer: Self-pay | Admitting: Obstetrics and Gynecology

## 2008-11-15 ENCOUNTER — Ambulatory Visit (HOSPITAL_BASED_OUTPATIENT_CLINIC_OR_DEPARTMENT_OTHER): Admission: RE | Admit: 2008-11-15 | Discharge: 2008-11-15 | Payer: Self-pay | Admitting: Obstetrics and Gynecology

## 2008-11-15 ENCOUNTER — Encounter: Payer: Self-pay | Admitting: Obstetrics and Gynecology

## 2008-11-15 ENCOUNTER — Ambulatory Visit: Payer: Self-pay | Admitting: Obstetrics and Gynecology

## 2008-11-21 ENCOUNTER — Ambulatory Visit: Payer: Self-pay | Admitting: Obstetrics and Gynecology

## 2008-11-28 ENCOUNTER — Ambulatory Visit: Payer: Self-pay | Admitting: Gynecology

## 2008-12-05 ENCOUNTER — Ambulatory Visit: Payer: Self-pay | Admitting: Obstetrics and Gynecology

## 2009-01-09 ENCOUNTER — Emergency Department (HOSPITAL_COMMUNITY): Admission: EM | Admit: 2009-01-09 | Discharge: 2009-01-09 | Payer: Self-pay | Admitting: Emergency Medicine

## 2009-01-23 ENCOUNTER — Encounter: Admission: RE | Admit: 2009-01-23 | Discharge: 2009-01-23 | Payer: Self-pay | Admitting: Neurosurgery

## 2009-02-18 ENCOUNTER — Inpatient Hospital Stay (HOSPITAL_COMMUNITY): Admission: RE | Admit: 2009-02-18 | Discharge: 2009-02-24 | Payer: Self-pay | Admitting: Neurosurgery

## 2009-02-26 ENCOUNTER — Emergency Department (HOSPITAL_COMMUNITY): Admission: EM | Admit: 2009-02-26 | Discharge: 2009-02-26 | Payer: Self-pay | Admitting: Emergency Medicine

## 2009-03-04 ENCOUNTER — Emergency Department (HOSPITAL_COMMUNITY): Admission: EM | Admit: 2009-03-04 | Discharge: 2009-03-04 | Payer: Self-pay | Admitting: Emergency Medicine

## 2009-03-20 ENCOUNTER — Encounter: Admission: RE | Admit: 2009-03-20 | Discharge: 2009-03-20 | Payer: Self-pay | Admitting: Neurosurgery

## 2009-04-09 ENCOUNTER — Ambulatory Visit: Payer: Self-pay | Admitting: Obstetrics and Gynecology

## 2009-04-16 ENCOUNTER — Encounter: Admission: RE | Admit: 2009-04-16 | Discharge: 2009-04-16 | Payer: Self-pay | Admitting: Neurosurgery

## 2009-05-10 HISTORY — PX: LAPAROSCOPY: SHX197

## 2009-06-11 ENCOUNTER — Other Ambulatory Visit: Admission: RE | Admit: 2009-06-11 | Discharge: 2009-06-11 | Payer: Self-pay | Admitting: Obstetrics and Gynecology

## 2009-06-11 ENCOUNTER — Ambulatory Visit: Payer: Self-pay | Admitting: Obstetrics and Gynecology

## 2009-10-08 ENCOUNTER — Ambulatory Visit: Payer: Self-pay | Admitting: Obstetrics and Gynecology

## 2010-03-31 ENCOUNTER — Ambulatory Visit: Payer: Self-pay | Admitting: Obstetrics and Gynecology

## 2010-05-30 ENCOUNTER — Encounter: Payer: Self-pay | Admitting: Obstetrics and Gynecology

## 2010-05-31 ENCOUNTER — Encounter: Payer: Self-pay | Admitting: Family Medicine

## 2010-05-31 ENCOUNTER — Encounter: Payer: Self-pay | Admitting: Neurosurgery

## 2010-08-13 LAB — BASIC METABOLIC PANEL
BUN: 3 mg/dL — ABNORMAL LOW (ref 6–23)
CO2: 29 mEq/L (ref 19–32)
Calcium: 8.5 mg/dL (ref 8.4–10.5)
Chloride: 107 mEq/L (ref 96–112)
GFR calc Af Amer: >60 mL/min (ref >60)
GFR calc non Af Amer: >60 mL/min (ref >60)
Glucose, Bld: 117 mg/dL — ABNORMAL HIGH (ref 70–99)
Potassium: 3.8 mEq/L (ref 3.5–5.1)
Sodium: 135 mEq/L (ref 135–145)
Sodium: 141 mEq/L (ref 135–145)

## 2010-08-13 LAB — TYPE AND SCREEN: Antibody Screen: NEGATIVE

## 2010-08-13 LAB — CBC
HCT: 26 % — ABNORMAL LOW (ref 36.0–46.0)
Hemoglobin: 8.2 g/dL — ABNORMAL LOW (ref 12.0–15.0)
Hemoglobin: 11.1 g/dL — ABNORMAL LOW (ref 12.0–15.0)
Hemoglobin: 8.8 g/dL — ABNORMAL LOW (ref 12.0–15.0)
MCHC: 34.2 g/dL (ref 30.0–36.0)
MCV: 93.7 fL (ref 78.0–100.0)
MCV: 96 fL (ref 78.0–100.0)
Platelets: 240 10*3/uL (ref 150–400)
RBC: 2.53 MIL/uL — ABNORMAL LOW (ref 3.87–5.11)
RBC: 3.47 MIL/uL — ABNORMAL LOW (ref 3.87–5.11)
RDW: 14.6 % (ref 11.5–15.5)
WBC: 11.4 10*3/uL — ABNORMAL HIGH (ref 4.0–10.5)
WBC: 9.7 10*3/uL (ref 4.0–10.5)

## 2010-08-13 LAB — CULTURE, BLOOD (ROUTINE X 2)
Culture: NO GROWTH
Culture: NO GROWTH

## 2010-08-13 LAB — POCT I-STAT 4, (NA,K, GLUC, HGB,HCT)
Hemoglobin: 5.1 g/dL — CL (ref 12.0–15.0)
Potassium: 6.2 mEq/L — ABNORMAL HIGH (ref 3.5–5.1)
Sodium: 135 mEq/L (ref 135–145)

## 2010-08-13 LAB — DIFFERENTIAL
Basophils Relative: 0 % (ref 0–1)
Lymphocytes Relative: 30 % (ref 12–46)
Lymphs Abs: 3.1 10*3/uL (ref 0.7–4.0)
Monocytes Absolute: 0.8 10*3/uL (ref 0.1–1.0)
Monocytes Absolute: 0.6 10*3/uL (ref 0.1–1.0)
Monocytes Relative: 7 % (ref 3–12)
Monocytes Relative: 6 % (ref 3–12)
Neutro Abs: 6.8 10*3/uL (ref 1.7–7.7)
Neutro Abs: 5.9 10*3/uL (ref 1.7–7.7)

## 2010-08-13 LAB — APTT
aPTT: 30 seconds (ref 24–37)
aPTT: 30 seconds (ref 24–37)

## 2010-08-14 LAB — DIFFERENTIAL
Basophils Absolute: 0 10*3/uL (ref 0.0–0.1)
Basophils Relative: 0 % (ref 0–1)
Eosinophils Relative: 1 % (ref 0–5)
Lymphocytes Relative: 34 % (ref 12–46)
Monocytes Absolute: 0.6 10*3/uL (ref 0.1–1.0)
Neutro Abs: 6 10*3/uL (ref 1.7–7.7)

## 2010-08-14 LAB — PROTIME-INR: Prothrombin Time: 12.9 seconds (ref 11.6–15.2)

## 2010-08-14 LAB — CBC
MCHC: 33.4 g/dL (ref 30.0–36.0)
Platelets: 310 10*3/uL (ref 150–400)
RDW: 12.6 % (ref 11.5–15.5)

## 2010-09-07 ENCOUNTER — Other Ambulatory Visit: Payer: Self-pay | Admitting: Obstetrics and Gynecology

## 2010-09-07 ENCOUNTER — Encounter (INDEPENDENT_AMBULATORY_CARE_PROVIDER_SITE_OTHER): Payer: 59 | Admitting: Obstetrics and Gynecology

## 2010-09-07 ENCOUNTER — Other Ambulatory Visit (HOSPITAL_COMMUNITY)
Admission: RE | Admit: 2010-09-07 | Discharge: 2010-09-07 | Disposition: A | Discharge status: Home / Self Care | Payer: 59 | Source: Ambulatory Visit | Attending: Obstetrics and Gynecology | Admitting: Obstetrics and Gynecology

## 2010-09-07 DIAGNOSIS — Z01419 Encounter for gynecological examination (general) (routine) without abnormal findings: Secondary | ICD-10-CM

## 2010-09-07 DIAGNOSIS — R823 Hemoglobinuria: Secondary | ICD-10-CM

## 2010-09-07 DIAGNOSIS — Z124 Encounter for screening for malignant neoplasm of cervix: Secondary | ICD-10-CM | POA: Insufficient documentation

## 2010-09-22 NOTE — H&P (Signed)
NAMEKRYSTYNE, Ann NO.:  [id number]   MEDICAL RECORD NO.:  [id number]           PATIENT TYPE:   LOCATION:                                 FACILITY:   PHYSICIAN:  Coletta Memos, M.D.     DATE OF BIRTH:  12-Oct-1980   DATE OF ADMISSION:  DATE OF DISCHARGE:                              HISTORY & PHYSICAL   ADMISSION DIAGNOSIS:  Right lower extremity pain.   INDICATIONS:  Ann Ann is a 30 year old woman who has had 3 lumbar  procedures, they were performed  on Sep 12, 2001, at which time she had a  left L4-L5 and left L5-S1 diskectomy.  She had another procedure on  April 19, 2007 for left L4-L5 diskectomy and a right L5-S1 diskectomy  on May 16, 2007, where she underwent a right L4-L5 foraminotomy and  right L5-S1 diskectomy.  She had an MRI done today secondary to a visit  to the emergency room earlier this morning.  She went to the emergency  room, she says, due to excruciating pain in the right lower extremity.  After her operation most recently, on May 16, 2007, she also received  epidural steroid injections to the L4-L5 and L5-S1 regions.  She is  being seen at a pain clinic and currently is taking OxyContin 20 mg p.o.  t.i.d. for the pain.  She says that this is an adequately treating her  pain at this time.  When explained that an operation would not be done  tonight nor over the weekend secondary to the fact that doctors are not  on call and she did not have a surgical emergency, she felt that her  pain would not be adequately controlled as an outpatient and after being  offered admission, she accepted.   Ann Ann MRI shows some minor changes at L4-L5 and L5-S1.  There is  certainly no clear cut etiology for the pain.  Dr. Electa Sniff, radiologist,  mentions possibly increase in size and a small disk protrusion at L5-S1.  I would not agree with that assessment.  Sagittal films shows  significant amount of scar there.  The axial films suggest  this, but  that is not confirmed by the sagittal imaging.  There also appears to be  more interference from the thecal sac, and it does not appear to be  obvious compression at the right S1 nerve root.  The L4-L5 region shows  nor suggest any type of neural compression.   Ann Ann states that she has been slightly constipated recently.  This may be due to this pain medication.  She in March, has had a number  of the emergency room visits secondary to nausea, vomiting, and  abdominal pain, and was actually admitted, I believe, on one occasion  during the month of March for that reason.   She does have von Willebrand disease, asthma, and some seasonal  allergies.  She does not use tobacco.  She does use alcohol  occasionally.   FAMILY HISTORY:  Coronary artery disease, diabetes, and hypertension.   PAST MEDICAL HISTORY:  Ann Ann also  has a medical history  significant headaches.   PAST SURGICAL HISTORY:  She underwent a temporal artery biopsy on the  left side secondary to these headaches in 2006.  She has undergone a  cesarean section and is also had a dilatation and curettage in addition  2-3 lumbar procedures.  She also has had a diagnostic lumbar puncture  performed at the time of her severe headaches.   ALLERGIES:  SULFA.   MEDICATIONS:  Darvocet and codeine.   PHYSICAL EXAMINATION:  GENERAL: She is alert.  Oriented x4 and answering  all questions appropriately.  She is in no apparent distress.  NEUROMUSCULAR: She has a normal gait.  Muscle tone, bulk, coordination  are normal.  She has some giveaway weakness in the right hamstring and  quadriceps.  She has some slight weakness in dorsiflexion in the right  foot and possibly an extensor hallucis longus, but the strength is not  less than 5-/5.  She has normal plantar flexion.  HEENT: Pupils are equal, round, and reactive to light.  She has full  extraocular movements.  Full visual fields.  Tongue and uvula in   midline.  Shoulder shrug is normal.  She has symmetric facial sensation  and movements.  Hearing intact to finger rub bilaterally.  ABDOMEN:  She is obese.  Well-healed scar in the lumbar region.  Well-  healed abdominal scar also.   MRI performed today was reviewed and described in the history portion of  this exam.   Ann Ann stated that she was unable to receive adequate pain control  with her current regimen of OxyContin 20 mg p.o. t.i.d.  I will admit  her to the hospital for pain control and place on a Morphine PCA pump.  Dr. Allena Katz will be available on Monday to review the films and at that  time, he and she can derive a satisfactory plan.  Neurologically, Ms.  Ann Ann is in good shape.           ______________________________  Coletta Memos, M.D.     KC/MEDQ  D:  09/01/2007  T:  09/02/2007  Job:  [id number]

## 2010-09-22 NOTE — H&P (Signed)
NAMEBRENDI, Ann [name] NO.:  [id number]   MEDICAL RECORD NO.:  [id number]          PATIENT TYPE:  INP   LOCATION:  5018                         FACILITY:  [facility]   PHYSICIAN:  Della Goo, M.D. DATE OF BIRTH:  1980/12/29   DATE OF ADMISSION:  05/06/2007  DATE OF DISCHARGE:                              HISTORY & PHYSICAL   This is an unassigned patient.   CHIEF COMPLAINT:  Nausea, vomiting, diarrhea.   HISTORY OF PRESENT ILLNESS:  This is a 30 year old female presenting to  the emergency department with complaints of severe nausea, vomiting, and  diarrhea for 3 days.  She reports being unable to hold down any foods,  liquids, or her pain medications.  The patient had a recent back  surgery, an L4-L5 diskectomy, foraminectomy, and lysis of adhesions,  along with a right L5-S1 diskectomy and foraminotomy, performed by Dr.  Hilda Lias, neurosurgery.   PAST MEDICAL HISTORY:  1. Degenerative disk disease.  2. Von Willebrand's disease.  3. Morbid obesity.  4. Bipolar disorder.   PAST SURGICAL HISTORY:  1. As mentioned above.  2. A previous L4-L5 diskectomy in 2003.  3. History of a C-section.  4. Previous history of a D&C.  5. The patient has a history of HSV II.   MEDICATIONS AT THIS TIME:  Dilaudid, Percocet, and Valium p.r.n.   ALLERGIES:  1. DARVOCET.  2. SULFA.  3. CODEINE.   SOCIAL HISTORY:  The patient lives at home with her daughter.  She is a  nonsmoker, nondrinker.   FAMILY HISTORY:  Paternal grandfather with coronary artery disease.  Positive hypertension in her maternal grandparents and maternal aunt.  Both grandfathers with type 2 diabetes mellitus.  No history of cancer  in the family that she knows of.   REVIEW OF SYSTEMS:  Pertinents are mentioned above.  The patient denies  having any fevers, chills, chest pain, shortness of breath.  She does  have back pain.  She has had no problems with bowel or bladder function.   PHYSICAL  EXAMINATION FINDINGS:  GENERAL:  This is an morbidly obese 80-  year-old female, currently in no discomfort or acute distress.  VITAL SIGNS:  Temperature 99.3, blood pressure 128/86, heart rate 104,  respirations 20, O2 saturations 97%.  HEENT: Normocephalic, atraumatic.  Pupils equally round, reactive to  light.  Extraocular muscles are intact.  Funduscopic benign.  Oropharynx  is clear.  NECK:  Supple full range of motion.  No thyromegaly, adenopathy, or  jugular venous distention.  CARDIOVASCULAR:  Regular rate and rhythm, with mild tachycardia.  LUNGS:  Clear to auscultation bilaterally.  ABDOMEN:  Positive bowel sounds.  Soft, nontender, nondistended.  EXTREMITIES:  Without cyanosis, clubbing, or edema.  NEUROLOGIC:  Alert and oriented.  Nonfocal.   LABORATORY STUDIES:  Chemistry with a sodium of 140, potassium 4.1,  chloride 109, bicarbonate 27, BUN 10, creatinine 1, and glucose 93.  Urinalysis negative..   ASSESSMENT:  A 30 year old female being admitted with:  1. Acute gastroenteritis.  2. Back pain.  3. Morbid obesity.   PLAN:  The patient will  be admitted, placed on IV fluids for rehydration  therapy, and antiemetic therapy has been ordered.  Pain control therapy  has also been ordered as needed..  Stool cultures will be sent for  studies, culture and sensitivity, and C.  difficile.  GI and DVT  prophylaxis have also been ordered.      Della Goo, M.D.  Electronically Signed     HJ/MEDQ  D:  05/07/2007  T:  05/08/2007  Job:  [id number]

## 2010-09-22 NOTE — Discharge Summary (Signed)
Ann [name], VIELE NO.:  [id number]   MEDICAL RECORD NO.:  [id number]          PATIENT TYPE:  INP   LOCATION:  3012                         FACILITY:  [facility]   PHYSICIAN:  Hilda Lias, M.D.   DATE OF BIRTH:  09/22/1980   DATE OF ADMISSION:  05/29/2007  DATE OF DISCHARGE:  06/10/2007                               DISCHARGE SUMMARY   ADMISSION DIAGNOSES:  1. Chronic back pain status, post L4-5, L5-S1 diskectomy.  2. Rule out infection.   FINAL DIAGNOSIS:  Right L4-5, L5-S1 neuropathy, foraminal stenosis.   [name] is a 30 year old female who had had three back surgeries.  She is only 30 years old.  She is good quite obese.  The last surgery  was on January 6 where we did a L4-5 and L5-S1 diskectomy and  foraminotomy.  Nevertheless, she came to the emergency room complaining  of some drainage from the lumbar wound.  She was admitted by Dr.  Newell Coral.  A workup for infection was made.   LABORATORY:  Essentially negative.  The only findings in the laboratory  was the sedimentation rate was slightly elevated.  The white cells were  normal.  Cultures of the wound were essentially negative.   The patient had just one episode of small drainage from the wound but  since the admission she has not drained for the past 10 days.  She is  doing much better.  We did several MRI, which showed no evidence of any  recurrence of herniated disk.  No evidence of any infection.  Nevertheless, there was a possibility of her having a CSF leak but the  patient has no symptoms whatsoever or a spinal headache.  Because of  that, surgery was not advised.  The patient has a history of von  Willebrand disease and she had one episode of bleeding after she  vomited.  The upper endoscopy done by [facility] was negative.  This morning  the patient had an L3-4 and L4-5 transforaminal injection and she felt  that the one at the level of L4-5 really helped her with the pain.  She  is ready to  go home early in the morning.   CONDITION ON DISCHARGE:  Improvement.   MEDICATION:  Dilaudid for pain, diazepam, Lyrica,  and Vistaril for  nausea.   DIET:  Again, she was encouraged to lose weight.  [name] is over  150 pounds overweight.  She is going to call the insurance company to  see if they allow her to be seen by one of the surgeons who do gastric  bypass.   ACTIVITY:  Not to drive.   FOLLOW-UP:  She will be calling me to be seen in my office in 2 weeks.          ______________________________  Hilda Lias, M.D.    EB/MEDQ  D:  06/09/2007  T:  06/10/2007  Job:  [id number]

## 2010-09-22 NOTE — Consult Note (Signed)
Ann [name], JUREK NO.:  [id number]   MEDICAL RECORD NO.:  [id number]          PATIENT TYPE:  INP   LOCATION:  3012                         FACILITY:  [facility]   PHYSICIAN:  Jordan Hawks. Elnoria Howard, MD    DATE OF BIRTH:  July 12, 1980   DATE OF CONSULTATION:  06/06/2007  DATE OF DISCHARGE:                                 CONSULTATION   HISTORY OF PRESENT ILLNESS:  This is a 30 year old female who is well-  known to me from her office visits who was admitted to the hospital for  what appears to be an infected wound site, secondary to her back  surgery.  During her hospitalization, she was being treated with  antibiotics for the infections, and acutely she started to complain of  having nausea and vomiting with subsequent hematemesis.  The patient did  complain having hematemesis in the past, and it was felt that it was  secondary to a Mallory-Weiss tear.  Additionally, she also had  complained of hematochezia and underwent a flexible sigmoidoscopy which  was essentially nonrevealing for any etiology.  At the time, the patient  does report some mild epigastric tenderness but no overt symptoms of  gastroesophageal reflux disease.  Her hemoglobin is stable at this time,  and the patient reports only one episode of hematemesis.   PAST MEDICAL AND SURGICAL HISTORY:  Significant for von Willebrand's  disease, asthma, and three lumbar surgeries.   ALLERGIES:  To codeine, sulfa, and Darvocet.   FAMILY HISTORY:  Noncontributory.   SOCIAL HISTORY:  Negative for alcohol, tobacco, or illicit drug use.   REVIEW OF SYSTEMS:  Negative for the 12-point review of systems.   MEDICATIONS:  1. Keflex.  2. Dilaudid.  3. Lyrica.  4. Valium.  5. Benadryl.  6. Zofran.   ALLERGIES:  Sulfa, codeine and propoxyphene.   PHYSICAL EXAMINATION:  VITAL SIGNS:  Blood pressure is 115/68, heart  rate is 96, respirations 18, temperature is 98.4.  GENERAL:  The patient is in no acute distress,  alert and oriented.  HEENT:  Normocephalic, atraumatic.  Extraocular muscles are intact.  NECK:  Supple.  No lymphadenopathy.  LUNGS:  Clear to auscultation bilaterally.  CARDIOVASCULAR:  Regular rate and rhythm.  ABDOMEN:  Flat, soft, nontender, nondistended.  EXTREMITIES:  No clubbing, cyanosis or edema.   LABORATORY VALUES:  White blood cell count 12.6, hemoglobin 10.7,  platelets at 465, ESR 70.  PT is 14.1, INR 1.1, PTT 38, sodium is 137,  potassium is 3.3, chloride 105, CO2 26, glucose 111, BUN 9, creatinine  0.6.   IMPRESSION:  1. Hematemesis.  2. Wound infection.  The patient has a clinical history that is      consistent with a Mallory-Weiss tear, previously.  Apparently, she      had the same type of event in the past.  However, upon      presentation, it was felt be too late to have any significant      findings with an EGD, given her current acute hematemesis, and      evaluation is warranted.  Plan  at this time is perform an EGD, and      further recommendation is pending the findings of dictation.      Jordan Hawks Elnoria Howard, MD  Electronically Signed     PDH/MEDQ  D:  06/09/2007  T:  06/10/2007  Job:  [id number]   cc:   Hilda Lias, M.D.

## 2010-09-22 NOTE — Op Note (Signed)
NAMESUDIE, BANDEL NO.:  [id number]   MEDICAL RECORD NO.:  [id number]          PATIENT TYPE:  OIB   LOCATION:  3012                         FACILITY:  [facility]   PHYSICIAN:  Hilda Lias, M.D.   DATE OF BIRTH:  [birth year]   DATE OF PROCEDURE:  04/19/2007  DATE OF DISCHARGE:                               OPERATIVE REPORT   PREOPERATIVE DIAGNOSES:  1. Left L4-5 herniated disk, recurrent.  2. Right L5-S1 herniated disk.  3. Morbid obesity.   POSTOPERATIVE DIAGNOSES:  1. Left L4-5 herniated disk, recurrent.  2. Right L5-S1 herniated disk.  3. Morbid obesity.   PROCEDURE:  1. Left L4-5 diskectomy, foraminotomy and lysis of adhesions.  2. Right L5-S1 diskectomy and foraminotomy.  3. Microscopy.   SURGEON:  Hilda Lias, M.D.   ASSISTANT:  Payton Doughty, M.D.   CLINICAL HISTORY:  [name] is a 30 year old female who many years  ago underwent surgery at L4-5; this happened around 6 years ago.  Now,  for the past few days, she had been complaining of pain in the right leg  and suddenly the pain got worse in the left leg.  She has failed with  conservative treatment.  Since the last admission, she was diagnosed  with von Willebrand's disease disease; the patient told us right before  surgery.  Because of that, we went ahead and gave the DDAVP and we gave  her IV cryoprecipitate.  The surgery was fully explained to her.  The  problem here is that this lady has since the surgery had become quite  obese and she had developed quite a bit of degenerative disk disease at  the level of 4-5 and 5-1.  She complained of minimal back pain.  Additionally, she is fully aware that she need to do something to lose  weight because otherwise, she might end up having more lumbar disease  and as a consequence, she might end up having to a lumbar fusion.  I  fully explained this to her and her mother, who was present.   PROCEDURE:  Ms Ann [name] was taken to the OR and she was  positioned in a  prone manner.  The skin was cleaned with DuraPrep.  A midline incision  following the previous one was made, extending distally.  The incision  was carried down through a thick adipose tissue.  We found the fascia  and we opened the left side and then the right side.  X-rays showed that  indeed we were at the level of 4-5 and 5-1.  We brought the microscope  into the area and we started working at the level of 4-5 first.  The  patient has quite a bit of adhesion and it took [place] about 30 minutes to  do lysis of adhesions to decompress and retract the L5 nerve root.  There was a large piece of disk adherent to the L4 nerve root.  Dissection was done, although we were able to decompress the L4 and L5  nerve root.  Nevertheless, we had a small opening in arachnoid.  There  was some  CSF leak.  Then a piece of fat was left in the area, which  stopped the leakage.  Retraction on the thecal sac was made and we  entered the disk space with total gross diskectomy achieved.  Then our  attention was at the level of 5-1 on the right side.  The patient had  quite a bit of lordosis and it was difficult to get into the L5-S1  space.  Nevertheless, at the end, we had a laminotomy of L5-S1 with  removal of a calcified yellow ligament.  Retraction was made and there  was a large herniated disk mostly centrally and laterally.  Incision was  made and total diskectomy was achieved with good decompression of the L5  and S1 nerve roots.  Then we went back to the L4-5 space.  Valsalva  maneuver was negative.  Nevertheless, we used Tisseel to stop all  leakage.  Then the area was irrigated and the wound was closed with  Vicryl and staples.           ______________________________  Hilda Lias, M.D.     EB/MEDQ  D:  04/19/2007  T:  04/20/2007  Job:  [id number]

## 2010-09-22 NOTE — Discharge Summary (Signed)
NAMEKELCE, BOUTON NO.:  [id number]   MEDICAL RECORD NO.:  [id number]          PATIENT TYPE:  INP   LOCATION:  5156                         FACILITY:  [facility]   PHYSICIAN:  Danae Orleans. Venetia Maxon, M.D.  DATE OF BIRTH:  [birth year]   DATE OF ADMISSION:  09/01/2007  DATE OF DISCHARGE:  09/10/2007                               DISCHARGE SUMMARY   ADMISSION DIAGNOSIS:  Right leg pain.   FINAL DIAGNOSIS:  Right leg pain.   HISTORY OF PRESENT ILLNESS:  This is a 30 year old woman who has had 3  prior lumbar injections performed on Sep 12, 2001 with essentially the  left L4-L5 and left L5-S1 diskectomy.  She had another procedure on  April 19, 2007, for left L4-L5 diskectomy and right L5-S1 diskectomy  in May 16, 2007.  At that point, she underwent right L4-L5 and L5-S1  foraminotomy and diskectomy.  She has had excruciating right leg pain.  She had epidural steroid injections.  She was on OxyContin 20 mg t.i.d.  She was admitted back to the hospital with persistent right leg pain.  She underwent disk space aspiration to make sure there is no evidence of  infection.  This was evaluated and was negative.  Dr. Orvan Falconer from  infectious disease saw her and felt that she did not have an infectious  etiology for her pain.  She was stable as of Sep 10, 2007, and was  discharged home with instructions to follow up with Dr. Jeral Fruit in the  office next week.   DISCHARGE MEDICATIONS:  Include her home medications of;  1. Neurontin 800 mg every 8 hours.  2. OxyContin 20 mg 3 times daily.  3. Robaxin daily.   She has all of these medications, so no new prescriptions were written.  The patient at this point is discharged to home in stable and  satisfactory condition with chronic right leg pain syndrome with no  evidence of intraspinal infection.      Danae Orleans. Venetia Maxon, M.D.  Electronically Signed     JDS/MEDQ  D:  09/10/2007  T:  09/11/2007  Job:  [id number]

## 2010-09-22 NOTE — Discharge Summary (Signed)
Ann [name], KAFFENBERGER NO.:  [id number]   MEDICAL RECORD NO.:  [id number]          PATIENT TYPE:  INP   LOCATION:  3009                         FACILITY:  [facility]   PHYSICIAN:  Hewitt Shorts, M.D.DATE OF BIRTH:  11/18/1980   DATE OF ADMISSION:  05/12/2007  DATE OF DISCHARGE:  05/20/2007                               DISCHARGE SUMMARY   ADMISSION HISTORY AND PHYSICAL EXAMINATION:  The patient is a 30-year-  old woman patient of Dr. Jeral Fruit, who has had number of back surgeries.  She was admitted because of increasing pain to the right lower extremity  and apparently MRI showed recurrent disc herniation.  The patient has a  history of von Willebrand's disease and was seen in consultation by Dr.  Cyndie Chime who has followed her throughout the hospitalization.  The  patient was admitted by Dr. Venetia Maxon, but the care was transferred to her  treating physician Dr. Jeral Fruit, who continue to follow her and treat her  through the hospitalization.  Details of her admission history and  physical included in Dr. Fredrich Birks note as well as in the consulting  notes.   HOSPITAL COURSE:  The patient was seen in preoperative evaluation by Dr.  Cyndie Chime.  The patient was taken to surgery by Dr. Jeral Fruit for a right  L4-L5 foraminotomy and right L5-S1 discectomy and removal of free  fragment disc and lysis of adhesions.  She has made a good progress  postoperatively.  Her wound has healed nicely.  She is afebrile.  She  explained that she wanted to go home yesterday, but Dr. Jeral Fruit said it  is best for her to wait till today and at this time, she is up and  ambulating actively asking again to be discharged.  She has been given a  prescription for Percocet 1 or 2 tablets p.o. q.4-6 h. p.r.n. pain, 40  tablets, no refills.  She is to follow up with Dr. Jeral Fruit in 2 weeks.  She has been given instructions regarding wound care and activities.  Dr. Cyndie Chime does not feel that followup with  him is needed except on  an as needed basis.   DISCHARGE DIAGNOSES:  Recurrent lumbar disc herniation and von  Willebrand's disease.      Hewitt Shorts, M.D.  Electronically Signed     RWN/MEDQ  D:  05/20/2007  T:  05/20/2007  Job:  [id number]

## 2010-09-22 NOTE — Op Note (Signed)
Ann [name], Ann NO.:  [id number]   MEDICAL RECORD NO.:  [id number]          PATIENT TYPE:  INP   LOCATION:  3009                         FACILITY:  [facility]   PHYSICIAN:  Hilda Lias, M.D.   DATE OF BIRTH:  08/09/1980   DATE OF PROCEDURE:  05/16/2007  DATE OF DISCHARGE:                               OPERATIVE REPORT   PREOPERATIVE DIAGNOSIS:  Right L4-5 herniated disk, right L5-S1  herniated disk with a prominent fragment status post 4-5 diskectomy.  Von Willebrand disease.   POSTOPERATIVE DIAGNOSIS:  Right L4-5 herniated disk, right L5-S1  herniated disk with a prominent fragment status post 4-5 diskectomy.  Von Willebrand disease.   PROCEDURE:  Right L4-L5 foraminotomy, right L5-S1 diskectomy, removal of  free fragment for decompression.  Lysis of adhesions.  Microscope.   SURGEON:  Hilda Lias, M.D.   ASSISTANT:  Dr. Phoebe Perch.   CLINICAL HISTORY:  [name] is a 30 year old female who in the past  underwent surgery at the level of 4-5 and 5-1.  After she went home, she  did really well but now she was admitted by my partner Dr. Venetia Maxon because  of worsening pain down to the right leg.  X-rays showed that she has a  herniated disk at the L4-5, 5-1.  Surgery was advised.  The risks were  explained to her and her mother including the possibility of bleeding  but because her blood disease also this lady is only 26 years, she is  over 100 pounds overweight and all the risks associated with __________  surgery including fusion.  She was advised to seek a surgical consult of  the possibility of gastric bypass.   PROCEDURE:  The patient was taken to the OR and she was positioned in  prone manner.  Then the skin was prepped with DuraPrep.  Midline  incision resecting the previously one was made and incision was carried  out all the way down to the right side.  We found some subcutaneous  seroma.  Dissection was carried out to the right side and we were  able  to visualize the area of 4-5, 5-1.  With the microscope which was the  best way see the area because of the __________ .  With a drill we  drilled the lower lamina of L4, the upper of L5.  A thick yellow  ligament also was excised.  We identified the L4 and L5 nerve root.  Although there was a bulging disk, nevertheless, there was no frank  compression or displacement of either L4 or L5 nerve roots.  Foraminotomy to decompress the nerve root was achieved.  Then we went  down to the 5-1 area.  We did a lysis of adhesion.  Part of the yellow  ligament also was excised.  We found that the S1 nerve root was almost  completely anchored to the floor.  Lysis was accomplished we entered the  disk space with the several piece of fragments were removed.  Then at  the end, there was a piece of fragment right at the takeoff of the S1  nerve root which also was removed.  Lysis of adhesion was achieved.  X-  rays  showed that indeed we were in both areas.  After that we went back and  looked at the level of 4-5 and indeed there was no need do any  diskectomy at this level.  Area was irrigated.  Fentanyl was left in the  epidural space and the wound was closed with Vicryl and Steri-Strips.           ______________________________  Hilda Lias, M.D.     EB/MEDQ  D:  05/16/2007  T:  05/17/2007  Job:  [id number]

## 2010-09-22 NOTE — Discharge Summary (Signed)
Ann [name], Ann [name]               ACCOUNT NO.:  [id number]   MEDICAL RECORD NO.:  [id number]          PATIENT TYPE:  INP   LOCATION:  5118                         FACILITY:  [facility]   PHYSICIAN:  Wilson Singer, M.D.DATE OF BIRTH:  March 27, 1981   DATE OF ADMISSION:  07/28/2007  DATE OF DISCHARGE:  07/31/2007                               DISCHARGE SUMMARY   FINAL DISCHARGE DIAGNOSIS:  Gastroenteritis.   CONDITION ON DISCHARGE:  Stable.   MEDICATIONS ON DISCHARGE:  1. Neurontin 600 mg t.i.d.  2. Lorazepam 10  mg p.r.n. daily.   HISTORY:  This 30 year old lady was admitted with nausea and vomiting  intractably.  Please see initial history and physical examination by Dr.  Ebony Cargo.   HOSPITAL PROGRESS:  The patient was treated conservatively with  antiemetics and intravenous fluids.  She did well overall in the next 24-  48 hours and lipase was 11.  On the day of discharge, she was tolerating  a regular diet and there were no complaints of abdominal pain, nausea,  or vomiting.   PHYSICAL EXAMINATION:  VITAL SIGNS:  Temperature was 98.7, blood  pressure 119/72, pulse 86, and saturation 99% room air.  ABDOMEN:  Soft and nontender.   DISPOSITION:  The patient will follow up with her primary care  physician.      Wilson Singer, M.D.  Electronically Signed     NCG/MEDQ  D:  09/18/2007  T:  09/19/2007  Job:  [id number]

## 2010-09-22 NOTE — H&P (Signed)
NAMEREASE, SWINSON NO.:  [id number]   MEDICAL RECORD NO.:  [id number]         PATIENT TYPE:  WAMB   LOCATION:                                FACILITY:  [facility]   PHYSICIAN:  Juan H. Lily Peer, M.D.DATE OF BIRTH:  1981/01/11   DATE OF ADMISSION:  05/09/2008  DATE OF DISCHARGE:                              HISTORY & PHYSICAL   The patient is scheduled for surgery on Thursday, December 31, at 1  p.m., please have history and physical available.   CHIEF COMPLAINT:  1. Dysfunctional bleeding.  2. Von Willebrand disease.  3. Endometrial polyp.   HISTORY:  The patient is a 30 year old gravida 2, para 1, AB 1, who had  been in the past with a Marine IUD for 5 years and had done well.  Her  Marine IUD was then changed.  She had it a for short time and wanted to  have removed because she was spotting.  She was placed on the oral  contraceptive pill and that continued to bleed.  She had an evaluation  consisting with sonohysterogram, which had demonstrated that she has an  endometrial polyps measuring 33 x 90 x 16 mm and her ovaries appeared to  be normal.  The patient had been followed in the past by Dr.  Cyndie Chime.  The patient takes DDAVP each nostril for 5 days during her  menses to help with the bleeding.  The patient's most recent CBC had  demonstrated a hemoglobin at 12.4, hematocrit 38.3, and platelet count  306,000.  She had been taking iron supplementation as well help with her  bleeding.  She had been placed on Megace 40 mg b.i.d.  Her lab work also  have included a serum pregnancy test on May 06, 2008, which was  negative and her urinalysis was negative as well.  The patient is  scheduled to undergo resectoscopic polypectomy.   PAST MEDICAL HISTORY:  The patient is allergic to CODEINE, SULFA and  DARVOCET.   She has had a history of CIN 1 in 2006.  Her most recent Pap smear in  August 2009, was benign, reactive changes.  Otherwise, negative Pap.  She is currently only on the Megace 40 mg b.i.d.   MEDICAL CONDITION:  She has had von Willebrand disease had been followed  by Dr. Cyndie Chime.  She has had various diskectomy in her back in 2008  and 2009 by Dr. Jeral Fruit.  She has had D and C in the past and she has  also had diskectomy in 2003 as well as a cesarean section.   FAMILY HISTORY:  Grandfather with diabetes, grandparents and mother with  hypertension.   PHYSICAL EXAMINATION:  VITAL SIGNS:  The patient weighs 258 pounds, she  is 5 feet 2-1/4 inch tall, and blood pressure 136/88.  HEENT:  Unremarkable.  NECK:  Supple.  Trachea in the midline.  No carotid bruits.  No  thyromegaly.  LUNGS:  Clear to auscultation without rhonchi or wheezing.  HEART:  Regular rate and rhythm.  No murmurs or gallop.  BREASTS:  Exam not done.  ABDOMEN:  Soft and nontender.  No rebound or guarding.  PELVIC:  Bartholin, urethra, Skene within normal limits.  Vagina and  cervix, no gross lesions on inspection.  Uterus slightly retroverted.  No palpable adnexal mass or tenderness.   ASSESSMENT:  A 30 year old gravida 2, para 1, AB 1, with dysfunctional  bleeding despite having had a Marine IUD and on the oral contraceptive  pill, workup consist of sonohysterogram demonstrated an endometrial  polyp.  The patient scheduled to undergo resectoscopic polypectomy at  [facility] on Thursday, December 31, at 1 p.m.  The risks,  benefits, and pros and cons of the operation were discussed.  She had a  laminaria placed intracervically today.  The patient with history of  mild von Willebrand disease which she has been taking DDAVP intra-  nostril for 5 days during her period to cut down on her bleeding.  The  patient is hemodynamically stable.  The risks, benefits, and pros and  cons the operation were discussed.  All questions were answered and will  follow accordingly.   PLAN:  The patient scheduled for resectoscopic polypectomy on Thursday,  December  31, 1 p.m. at [facility].  Please have the history of  physical available.      Juan H. Lily Peer, M.D.  Electronically Signed     JHF/MEDQ  D:  05/08/2008  T:  05/09/2008  Job:  [id number]

## 2010-09-22 NOTE — H&P (Signed)
NAMEAMNAH, BREUER NO.:  [id number]   MEDICAL RECORD NO.:  [id number]          PATIENT TYPE:  OIB   LOCATION:  3012                         FACILITY:  [facility]   PHYSICIAN:  Hilda Lias, M.D.   DATE OF BIRTH:  1980-06-25   DATE OF ADMISSION:  04/19/2007  DATE OF DISCHARGE:                              HISTORY & PHYSICAL   [name] is a lady who underwent diskectomy in the L4-5 about 4 years  ago.  The patient did well, I have not seen her in a long while and then  she showed up in my office several weeks ago with complaints mostly of  pain going mostly to the right leg.  At that time we did an MRI which  shows a small herniated disk at L5-1 but a larger one at L4-5 to the  left. Nevertheless she has no complaint of the left leg despite the MRI.  She had epidural injection.  She did not get any better and yesterday  she showed up. She was supposed to call  me right after Thanksgiving but  she never called me. Nevertheless yesterday she showed up in the  emergency room with worsening pain.  X-ray showed the same finding. This  time the pain was not only going to the right leg but it was worse in  the left leg.  She was seen in my office and being admitted today for  surgery.   PAST MEDICAL HISTORY:  L4-5 diskectomy 5 years ago. She is allergic to  SULFA and DARVOCET. The patient has a diagnosis since the lumbar surgery  5 years ago.  We were not aware until right now when told us that she  has Von-Willebrand disease.   FAMILY HISTORY:  Negative.   REVIEW OF SYSTEMS:  Positive for back pain and leg pain.   PHYSICAL EXAMINATION:  The patient came to my office limping from the  left leg.  She was quite miserable.  HEENT:  Normal.  NECK:  She has a good flexibility.  LUNGS:  Clear.  HEART:  Normal.  EXTREMITIES:  Normal pulses.  NEURO:  She has  weak dorsiflexion of the left foot.  She has a weak  plantar flexion.  She complained of numbness which  involved mostly the  L5 nerve root on the left side.  With straight leg raising, SLR is  positive bilateral at 30 degrees.  The MRI is quite remarkable.  She has  a large recurrent herniated disk at the L4-5 to the left and a small one  L5-S1 to the right.   CLINICAL IMPRESSION:  Left L4-5 herniated disk.  Right L5-S1.  Beside  that, the patient has a severe case of degenerative disk disease at both  levels.   RECOMMENDATIONS:  The patient is being admitted for surgery.  The  procedure will be L4-5 diskectomy in the left and L5-S1 diskectomy in  the right.  She knows about the risks of infection, CSF leak, bleeding  because her  blood disease as well as the need in the future she might require  fusion.  The patient is quite sure and she is morbidly obese.  She knows  that she has to start a regimen to lose weight. I am going to set up an  appointment to be seen by one of the surgeon's who does gastric bypass.           ______________________________  Hilda Lias, M.D.     EB/MEDQ  D:  04/19/2007  T:  04/20/2007  Job:  [id number]

## 2010-09-22 NOTE — Consult Note (Signed)
Ann Ann [name], Ann NO.:  [id number]   MEDICAL RECORD NO.:  [id number]          PATIENT TYPE:  INP   LOCATION:  3009                         FACILITY:  [facility]   PHYSICIAN:  Ann Ann [name], Ann MannDATE OF BIRTH:  December 16, 1980   DATE OF CONSULTATION:  05/16/2007  DATE OF DISCHARGE:                                 CONSULTATION   This Ann [name] a hematology consultation for perioperative advice on management  of this lady who needs repeat spine surgery.  She was diagnosed with von  Willebrand's disease back in [name] 2005.  Please see my office  consultation for full details.  She has a history of menorrhagia.  She  had initial back surgery in May 2003 after a work-related injury and  required an L4-L5 diskectomy.  Of note, she had no excessive bleeding  after that procedure.  Due to ongoing problems with menorrhagia and  postpartum bleeding after her first child in November 2004, additional  special hematology studies were done, and diagnosis of von Willebrand's  disease was established.  [name] was low at 44% (46-146) with a  normal factor VIII level of 71% (57-124).  A repeat VW profile done in  [name] 2005 showed [name] low-normal at 51%, ristocetin cofactor  activity decreased at 28% (43-156) with a low-normal factor VIII level  of 57% (56-191).  Hemoglobin was 12, platelet count 339,000 at that  time.   [name] had no bleeding problems ,however, her sister was  diagnosed with aplastic anemia.   I advised the use of DDAVP to cover minor to moderate surgical  procedures including abdominal surgery.  I advised using  concentrated  DDAVP nasal spray for excessive menstrual bleeding.  The patient has not  returned for a followup visit since her initial consultation with me in  [name] 2005.   She was recently readmitted here on April 18, 2007, with recurrent  back pain.  Findings were a left 4-5 herniated disk and a right L5-S1  herniated  disk.  [name], foraminotomy and  lysis of adhesions and a right L5-S1 disk at diskectomy and foraminotomy  by Dr. Jeral Fruit on April 19, 2007.  She was pretreated with DDAVP and  cryoprecipitate.   She initially had improvement of her pain but then presented with  persistent primarily right-sided back pain.  MRI done on May 13, 2007, showed stable recurrent disk protrusion at L4-5 with some mass  effect on the left L5 nerve root and recurrent disk protrusion at L5-S1  central and to the right with mass effect on the right S1 nerve root,  increased compared with a April 25, 2007, study.  A small amount of  enhancing fluid was seen but not felt to be significant.  Repeat surgery  was planned.  I was contacted by the anesthesiologist for further  recommendations on perioperative clotting factor management.   PHYSICAL EXAMINATION:  GENERAL:  Examination reveals a morbidly obese  African-American woman.  VITAL SIGNS:  Blood pressure 124/78, pulse 95 and regular, respirations  18, temperature 99.3.  Weight  118 kg.  SKIN:  There Ann [name] a large tattoo on the right thigh.  LUNGS:  Clear.  HEART:  Regular cardiac rhythm.  No murmur.  ABDOMEN:  Soft, obese, nontender.  EXTREMITIES:  No edema.  No calf tenderness.  NEUROLOGIC:  There Ann [name] 4/5 weakness in flexion of her right foot.  Reflexes are 2+ and symmetric.   LABORATORY DATA:  Hemoglobin 12, hematocrit 35, white count 13,000, 71%  neutrophils, platelets 412,000. Sodium 139, potassium 5.3, BUN 9,  creatinine 0.6. Liver chemistries normal except for mild elevation of  bilirubin at 1.6 and SGOT 39. I suspect there was some low-grade  hemolysis in the test tube to explain these findings.   IMPRESSION:  Mild von Willebrand's disorder.   RECOMMENDATIONS:  In general, most surgical procedures can be adequately  covered with a single agent DDAVP.  I would administer 0.3 mcg/kg in 100  mL normal saline IV over 20  minutes beginning 1 hour preop.  Repeat the  same dose 12 hours postop and then q.24 h for 5 days.  Once the patient  Ann [name] ambulatory and if she gets discharged from the hospital, she can use  the concentrated nasal spray instead of repetitive IV dosing.  This  comes as a brand name, Stimate, 1.5 mg per mL.  One uses 1 spray in each  nostril daily to complete 5-7 days postop.   If surgery Ann [name] expected to be more extensive or close to the spinal cord,  I would take the precaution of pretreating and post treating with a  factor VIII  von Willebrand clotting factor concentrate.  The best  product available in this country Ann [name] Humate-P.  Dosing Ann [name] 50 units/kg IV  over 20 minutes to begin q.8 h and then titrate based on factor VIII  blood levels.  I discussed my recommendations with the anesthesiologist and with the  neurosurgeon.  Since surgery Ann [name] expected to be involved, I would  recommend using this product for the first 24 hours and then use the  DDAVP as maintenance.   Thank you for this consultation.      Ann Churn. Cyndie Chime, M.D.  Electronically Signed     Ann Ann [name]  D:  05/17/2007  T:  05/17/2007  Job:  [id number]   cc:   Ann Ann [name], M.D.  Ann Limbo. Ann Ann [name], M.D.

## 2010-09-22 NOTE — Op Note (Signed)
Ann [name], Ann [name]               ACCOUNT NO.:  [id number]   MEDICAL RECORD NO.:  [id number]          PATIENT TYPE:  AMB   LOCATION:  NESC                         FACILITY:  [facility]   PHYSICIAN:  Daniel L. Gottsegen, M.D.DATE OF BIRTH:  07-Jun-1980   DATE OF PROCEDURE:  11/15/2008  DATE OF DISCHARGE:                               OPERATIVE REPORT   PREOPERATIVE DIAGNOSIS:  Menometrorrhagia with endometriosis suspected.   POSTOPERATIVE DIAGNOSIS:  Menometrorrhagia, pelvic adhesive disease,  possible endometriosis, possible small endometrial polyp.   OPERATIONS:  Diagnostic laparoscopy with lysis of pelvic adhesions and  excision followed by hysteroscopy with endometrial curettage and  excision of what might be a small endometrial polyp.   SURGEON:  Daniel L. Eda Paschal, M.D.   ANESTHESIA:  General.   INDICATIONS:  The patient is a 30 year old gravida 2, para 1, AB 1 who  has had a long history of menometrorrhagia.  She has been treated with a  Mirena IUD.  She has been treated with different dosages of oral  contraceptives including a 50 mcg estrogen.  She has used Megace and in  spite of all the above she continues to have menometrorrhagia.  She does  have von Willebrand's disease.  She has been using her DDAVP during her  period but the problem process.  She now enters the hospital for  laparoscopy with endometriosis suspected and hysteroscopy to be sure she  does not have a recurrent endometrial polyp as she had had one in 2009  that had been excised.   FINDINGS:  At the time of laparoscopy, the patient's uterus was slightly  boggy consistent with adenomyosis.  We could not see any endometriosis  on the vesicouterine fold of peritoneum, the uterus or either adnexa.  There were adhesions involving the right ovary and tube.  They certainly  could have active endometriosis in them.  The ileocecal junction was  identified.  Her appendix looked normal although she did have some  periappendiceal adhesions.  At the time of hysteroscopy there was a  small area on the anterior fundal wall that was either clot with  endometrial tissue or possibly small endometrial polyp.  Other than this  hysteroscopy was normal.   PROCEDURE IN DETAIL:  The patient was given IV DDAVP before the  procedure.  She was then brought to the operating room.  After general  endotracheal anesthesia she was placed in the dorsal supine position,  prepped and draped in the usual sterile manner.  A Foley catheter was  inserted into her bladder and a Hulka catheter was inserted into the  uterus.  A small incision was made subumbilically and through that the  operating laparoscope was placed using the OptiView for direct puncture  without any trauma.  A 5 mm port was placed suprapubically in the  midline.  The pelvis was visualized and was as noted above.  The pelvic  adhesive disease was lysed and excised.  It will be sent to pathology to  be sure she does not have endometriosis.  At the termination of the  procedure there was no bleeding.  The  trocars were removed.  The  pneumoperitoneum was evacuated.  The subumbilical fascial incision was  closed with zero Vicryl and the skin incisions were closed with 3-0  Monocryl.  Attention was next turned to the hysteroscopy.  The patient  was dilated to a #31 Pratt dilator and then a hysteroscopic resectoscope  was placed, 3% sorbitol was used to expand intrauterine cavity and the  camera was used for magnification.  There was one area on the anterior  wall of the fundus that could be a small polyp and it was excised with  the resectoscope.  Endometrial curettings were also obtained and  everything was sent to pathology for tissue diagnosis.  The procedure  was  terminated.  Blood loss for the entire procedure was minimal.  Fluid  deficit was 30 mL.  The patient tolerated the procedure well and left  the operating room in satisfactory condition.  She was  draining clear  urine from her Foley catheter which was then removed.      Daniel L. Eda Paschal, M.D.  Electronically Signed     DLG/MEDQ  D:  11/15/2008  T:  11/15/2008  Job:  [id number]

## 2010-09-22 NOTE — H&P (Signed)
Ann [name], Ann [name]               ACCOUNT NO.:  [id number]   MEDICAL RECORD NO.:  [id number]          PATIENT TYPE:  INP   LOCATION:  1826                         FACILITY:  [facility]   PHYSICIAN:  Hind I Elsaid, MD      DATE OF BIRTH:  Yaritzi 16, 1982   DATE OF ADMISSION:  07/28/2007  DATE OF DISCHARGE:                              HISTORY & PHYSICAL   CHIEF COMPLAINT:  Intractable nausea and vomiting.   This is a 30 year old African-American female with a history of obesity,  admitted today with a chief complaint of intractable nausea and  vomiting.  Symptoms started since last Wednesday with nausea, vomiting,  epigastric abdominal pain, and diarrhea, for which she came to the  hospital and evaluated by IV fluids and received CT abdomen which showed  the possibility of colitis.  The patient was discharged with Macrobid  and Flagyl.  Patient was asked to follow with her primary care if  symptoms were not improving.  Today, the patient went to her primary  care, as symptoms of vomiting continued.  The patient admitted she  vomited about 7-10 times per day, mainly yellow in color.  Denies any  vomiting.  Condition associated with nausea but according to the  patient, she received under-the-counter antidiarrheal, and the diarrhea  completely stopped.  The condition associated with periumbilical  abdominal pain, which radiated to both sides.  The patient was advised  by her primary care to come to the emergency room for possible  evaluation, as the patient seemed dehydrated and complained of  generalized body weakness.   PAST MEDICAL/SURGICAL HISTORY:  1. Significant for von Willebrand's disease.  2. Asthma.  3. Chronic back pain, status post back surgery.   ALLERGIES:  CODEINE, SULFA, DARVOCET.   SOCIAL HISTORY:  Negative for alcohol or tobacco abuse or illicit drug  use.  She works at [employer].   REVIEW OF SYSTEMS:  Denies any dizziness or blurring or vision.   Denies  any chest pain or shortness of breath.  Denies any burning micturition.   MEDICATIONS:  1. Neurontin 600 mg 3 times a day.  2. Lomotil 20 mg p.o. b.i.d. over the counter.  3. Macrobid.  4. Flagyl.   FAMILY HISTORY:  Mother has hypertension.  Father is healthy.   PHYSICAL EXAMINATION:  Temperature 99.1, blood pressure 134/80, pulse  rate 71, respiratory rate 16, saturating 96% on room air.  GENERAL:  An obese black female, not in acute distress or shortness of  breath.  LUNGS:  Clear to auscultation.  Has symmetrical respiration.  HEART:  S1 and S2.  No added sound.  No murmur.  ABDOMEN:  Soft, nondistended.  Mild tenderness at umbilical area and the  left lower quadrant.  Bowel sounds are present.  EXTREMITIES:  No clubbing, cyanosis or edema.   BLOOD WORKUP:  White blood cells 7.5, hemoglobin 12, hematocrit 35.5,  platelets 372.  Sodium 141, potassium 3.6, chloride 105, BUN 3, glucose  85, bicarb 29.1.  Hemoglobin 41, hematocrit 13.9.  Creatinine 0.8.   ASSESSMENT/PLAN:  1. Intractable nausea and vomiting:  Will admit the patient for      symptom management, mainly with IV fluids and antiemetics.  Mid      periumbilical abdominal pain.  The patient has CT of the abdomen on      March 16th, which showed the possibility of early colitis or      inflammatory bowel disease would be a consideration.  As the      symptoms continue to progress without resolution, I will go ahead      and repeat the CT abdomen and pelvis for evaluation of possible      complicated colitis, although this is less likely on my      differential, as the patient does not seem septic, and abdominal      examination without any evidence of acute event.  Will place the      patient on Cipro and Flagyl and a clear-liquid diet to be upgraded      as the patient's clinical symptoms improve.  2. Deep venous thrombosis and gastrointestinal prophylaxis:  Further      recommendations to be addressed as  symptoms per patient during      hospitalization.      Hind Bosie Helper, MD  Electronically Signed     HIE/MEDQ  D:  07/28/2007  T:  07/28/2007  Job:  [id number]

## 2010-09-22 NOTE — H&P (Signed)
NAMEINETA, SINNING NO.:  [id number]   MEDICAL RECORD NO.:  [id number]          PATIENT TYPE:  INP   LOCATION:  3009                         FACILITY:  [facility]   PHYSICIAN:  Danae Orleans. Venetia Maxon, M.D.  DATE OF BIRTH:  1981/02/11   DATE OF ADMISSION:  05/13/2007  DATE OF DISCHARGE:                              HISTORY & PHYSICAL   REASON FOR ADMISSION:  Right leg pain.   HISTORY OF ILLNESS:  Ann [name] is a 30 year old woman with a history  of Von Willebrand's disease who had surgery by Dr. Jeral Fruit on April 19, 2007 which consisted of a lumbar diskectomy from my review of her  studies at the L4-L5 and L5-S1 levels.  The patient had improvement in  her pain after surgery.  Says her pain was better on the left, but still  painful on the right.  She had an epidural steroid injection about 3  days after surgery which gave her some relief of her pain.  She was  admitted on May 08, 2007 per Dr. Allena Katz for pain control after the  pain gradually returned.  This became much more severe on May 11, 2007.  She discussed this with Dr. Lovell Sheehan and went to the emergency  room on May 12, 2007.  Efforts to relieve the pain in the emergency  room were without great relief.  She was admitted for pain control and  MRI to rule out infection.  She has had a prior laminectomy by Dr.  Jeral Fruit in 2003.   LABORATORY DATA:  Erythrocyte sedimentation rate 57, K 5.3, white blood  count is elevated at 13.1.   PAST MEDICAL HISTORY:  1. Significant for Von Willebrand's disease.  2. Asthma.  3. Seasonal allergies.   ALLERGIES:  1. CODEINE.  2. SULFA.  3. DARVOCET.   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  She has  a 2-year-old child.  She is a nonsmoker.  Occasional drinker of  alcoholic beverages.   PHYSICAL EXAMINATION:  VITAL SIGNS:  Temperature 98.3, pulse 96,  respiratory rate 18, blood pressure 130/86.  GENERAL:  She is awake, alert,  conversant, obese female, uncomfortable  standing or sitting.  EXTREMITIES:  She complains of pain radiating to her right buttock, down  into her right leg and into her foot.  She has full strength on  confrontational testing with mild give away weakness in right EHL with  symmetric reflexes.  No complaint of numbness.  Positive straight leg  raise on the right at 30 degrees.   IMPRESSION:  Ann [name] is a 30 year old female with recent back  surgery and right leg pain.  She is to be admitted for pain control to  rule out infection and for repeat MRI.  Subsequently, her MRI was  obtained which demonstrates prior laminectomy bilaterally at L5-S1 with  left L4-L5 diskectomy.  There is some disk material on the right lateral  recess at L4-L5 with questionable right L5 nerve root compression.  There is a small amount of fluid  posteriorly and with fluid in  the facet joints bilaterally at L4-L5, and  to a lesser degree at L5-S1.  There does not appear to be overt signs of  infection based on gadolinium enhanced study.  Dr. Jeral Fruit is to review  these studies.  I will observe her for fever and keep her comfortable  with pain management.      Danae Orleans. Venetia Maxon, M.D.  Electronically Signed     JDS/MEDQ  D:  05/13/2007  T:  05/13/2007  Job:  [id number]

## 2010-09-22 NOTE — Op Note (Signed)
NAMEGAYLIN, OSORIA               ACCOUNT NO.:  [id number]   MEDICAL RECORD NO.:  [id number]          PATIENT TYPE:  AMB   LOCATION:  SDC                           FACILITY:  [facility]   PHYSICIAN:  Juan H. Lily Peer, M.D.DATE OF BIRTH:  01/02/1981   DATE OF PROCEDURE:  DATE OF DISCHARGE:                               OPERATIVE REPORT   SURGEON:  Juan H. Lily Peer, MD   INDICATIONS FOR OPERATION:  A 30 year old gravida 2, para 1 AB1 with  history of dysfunctional uterine bleeding.  The patient with history of  von Willebrand disease.  Preoperative evaluation consisted of a  sonohysterogram which had demonstrated an endometrial polypoid-like  structure measuring 33 x 90 x 16 mm and ovaries appeared to be normal.  Her hemoglobin and hematocrit had been reported 12.4 and 38.3  respectively.   PREOPERATIVE DIAGNOSES:  1. Dysfunctional uterine bleeding.  2. Zenaida Niece Willebrand disease.  3. Endometrial polyp.   POSTOPERATIVE DIAGNOSES:  1. Dysfunctional uterine bleeding.  2. Zenaida Niece Willebrand disease.  3. Endometrial polyp.   ANESTHESIA:  General endotracheal anesthesia.   PROCEDURES PERFORMED:  1. Diagnostic hysteroscopy.  2. Resectoscopic polypectomy.  3. Dilation and curettage.   DESCRIPTION OF OPERATION:  After the patient was adequately counseled,  she was taken to the operating room where she underwent successful  general endotracheal anesthesia.  She had received a gram of cefoxitin  for prophylaxis.  The vagina and perineum were prepped and draped in  usual sterile fashion.  The patient was in low lithotomy position.  Bimanual examination demonstrated upper limits of normal uterus,  anteverted.  No palpable masses or tenderness.  The anterior cervical  lip was grasped with a single-toothed tenaculum.  The previously placed  laminaria that had been placed the day before intracervically was  removed requiring minimal cervical dilatation.  The Karl/Storz operative  resectoscope with  a 90 degree wire loop was introduced into the  intrauterine cavity, 3% sorbitol with a distending media.  In a  systematic fashion, both tubal ostia were identified.  A polypoid/polyp  like structure was found anteriorly located in lower uterine segment as  well was seen in the ultrasound.  This was resected as well as some  additional small appearing polypoid-like lesions.  This was submitted  for histological evaluation and a vigorous curettage was then performed  and this was submitted separately.  Reinspection of endometrial cavity  now demonstrated any other abnormality, and no bleeding.  Cervical canal  was smooth.  The hysteroscope was removed.  The patient tolerated the  procedure well.  She had a single-tooth tenaculum removed.  She was transferred to the recovery room with stable vital signs.  Of  note, the Valleylab electrosurgical generator had been set at 120 watts  on the cutting and 70 on the coagulation mode.  A 3% sorbitol with 65 mL  and IV fluids consisted of 800 mL of lactated Ringer's.      Juan H. Lily Peer, M.D.  Electronically Signed     JHF/MEDQ  D:  05/09/2008  T:  05/10/2008  Job:  [id number]

## 2010-09-22 NOTE — Discharge Summary (Signed)
NAMEMIEKO, KNEEBONE NO.:  [id number]   MEDICAL RECORD NO.:  [id number]          PATIENT TYPE:  INP   LOCATION:  3012                         FACILITY:  [facility]   PHYSICIAN:  Hilda Lias, M.D.   DATE OF BIRTH:  Dec 31, 1980   DATE OF ADMISSION:  04/19/2007  DATE OF DISCHARGE:  04/29/2007                               DISCHARGE SUMMARY   ADMISSION DIAGNOSIS:  Herniated disk 4-5 left, 5-1 right.   FINAL DIAGNOSES:  1. Herniated disk 4-5 left, 5-1 right.  2. Avascular necrosis of both hips.   CLINICAL HISTORY:  The patient was admitted because of back pain with  radiation to both legs, left worse than right one.  X-rays showed  herniated disk at the level of 4-5 to the left and 5-1 to the right.  The patient previously had surgery on the right 4-5.  Laboratory normal.   COURSE IN THE HOSPITAL:  The patient was taken to surgery, and left 4-5  diskectomy and 5-1 foraminotomy were accomplished.  The patient did  well.  The pain in the left leg, which was excruciating, went away, but  she continued to have pain in the right leg.  MRI showed possibility of  AVN of both hips.  She was seen by orthopedic surgeon who feels that  there was no need for surgery.  Nevertheless, we repeated the MRI of the  lumbar spine which was really quite unremarkable with no findings to the  right side whatsoever.  The patient had epidural injection. Today she is  feeling a bit better.  She has no headache.  She has been ambulating.  She is going to be discharged today to be followed by me in the office.   CONDITION ON DISCHARGE:  Stable.   DISCHARGE MEDICATIONS:  Percocet, diazepam, Lyrica.   She was encouraged to lose weight.  She has an appointment to be seen by  a gastric bypass surgeon.   ACTIVITY:  Not to drive.   FOLLOWUP:  To be seen by me in 4 weeks.           ______________________________  Hilda Lias, M.D.     EB/MEDQ  D:  04/29/2007  T:  04/30/2007  Job:   [id number]

## 2010-09-22 NOTE — H&P (Signed)
NAMESHERRICE, CREEKMORE NO.:  [id number]   MEDICAL RECORD NO.:  [id number]          PATIENT TYPE:  INP   LOCATION:  1823                         FACILITY:  [facility]   PHYSICIAN:  Hewitt Shorts, M.D.DATE OF BIRTH:  25-Feb-1981   DATE OF ADMISSION:  05/29/2007  DATE OF DISCHARGE:                              HISTORY & PHYSICAL   HISTORY OF PRESENT ILLNESS:  The patient is a 30 year old right-handed  black female.  The patient is under the care of Dr. Jeral Fruit, she is  status post 3 lumbar surgeries by Dr. Jeral Fruit including 2003, April 19, 2007, and May 16, 2007.  His operative report from April 19, 2007, describes a left L4-5 discectomy, foraminotomy, and lysis of  adhesion; a right L5-S1 discectomy and foraminotomy.  His operative note  from May 16, 2007, describes right L4-5 foraminotomy and a right L5-  S1 discectomy with removal of free fragment for decompression and lysis  of adhesions.   The patient explains that she had a small amount of wound drainage at  the time of discharge, it stopped, but restarted about 4 or 5 days ago.  She presented to the emergency room last night and was evaluated by the  emergency room staff who consulted with Dr. Franky Macho, who was on-call.  A  wound culture was performed.  Gram stain showed no organisms.  The  patient was started on Keflex 500 mg b.i.d. and instructed to contact  Dr. Jeral Fruit today at the office.  Unfortunately after numerous calls and  messages, she did not receive a call back and therefore, she contacted  me this evening because of increasing pain and discomfort and concerns  about the wound drainage, and we had her come to the emergency room for  evaluation.   She complains of pain from the right side of her lower back and down to  the right buttock into the right thigh into the proximal right leg.  She  does not describe any fevers.  She does describe some mild nausea, as  well as some slight  vomiting, and she is concerned about her p.o.  intake.  She does not describe specific weakness, but does say that it  is difficult to exert full effort with the right lower extremity due to  the pain.   The patient has been using a number of medications at home including  Percocet p.r.n., Valium p.r.n., and Dilaudid p.r.n.  She was started on  Keflex 500 mg q.i.d. last night in the emergency room and has been  continuing with that.   PAST MEDICAL HISTORY:  Notable for von Willebrand's disease, asthma, and  seasonal allergies.   PREVIOUS SURGERIES:  Includes 3 previous lumbar surgeries as described  above.   ALLERGIES:  She reports allergies to CODEINE, SULFA, and DARVOCET.   CURRENT MEDICATIONS:  Includes Percocet, Valium, Dilaudid, and Keflex.   FAMILY HISTORY:  Noncontributory.   SOCIAL HISTORY:  The patient works in [employer].  She has a 81-  year-old child.  She is a nonsmoker.  She is an occasional drinker of  alcoholic beverages.   REVIEW OF SYSTEMS:  Notable for that as described in the history of  present illness and past medical history, but is otherwise unremarkable.   PHYSICAL EXAMINATION:  GENERAL:  The patient is an obese black female in  no acute distress.  VITAL SIGNS:  Temperature is 98.8, pulse 93, and blood pressure 112/66.  LUNGS:  Clear to auscultation.  She has symmetrical respiratory  excursion.  HEART:  Regular rate and rhythm.  Normal S1 and S2.  There is no murmur.  ABDOMEN:  Soft, nondistended, and nontender.  Bowel sounds are present.  EXTREMITIES:  There is no clubbing, cyanosis, or edema.  Wound examination shows serosanguineous drainage beneath a Tegaderm  dressing.  There is no erythema or purulence.  NEUROLOGIC:  Motor examination shows 5/5 strength to the lower  extremities including the iliopsoas, quadriceps, dorsiflexors, extensor  hallucis longus, and plantar flexor bilaterally, although she does say  they give way on  testing for  the right lower extremity due to the pain.  Sensation is intact and symmetric in the distal lower extremities.  Reflexes showed the quadriceps are 2 bilaterally; gastrocnemius absent  bilaterally; toes are downgoing bilaterally.  Gait and stance does favor  the right lower extremity.   IMPRESSION:  Right lumbar radicular pain status post 2 recent surgeries  by Dr. Jeral Fruit with wound drainage from the upper aspect of the incision.  The patient is afebrile, and the drainage appears serosanguineous and  without purulence.   PLAN:  The patient will be admitted to the neurosurgical unit to Dr.  Cassandria Santee office.  She will be started on intravenous fluids due to the  nausea and vomiting.  Admission laboratories will be obtained including  a CBC with differential, PT, PTT, BMET, sed rate, and blood cultures.  I  will request an MRI of the lumbar spine to be done without and with  gadolinium, and she will be continued on analgesics and muscle  relaxants.   Dr. Jeral Fruit will follow up with the patient in the morning.  I have  mentioned to the patient that one consideration if the MRI does not show  nerve compression would be that of medication for neuropathic pain such  as Lyrica and certainly, she can discuss that with Dr. Jeral Fruit once they  have the results of her MRI.      Hewitt Shorts, M.D.  Electronically Signed     RWN/MEDQ  D:  05/29/2007  T:  05/29/2007  Job:  [id number]

## 2010-09-22 NOTE — Discharge Summary (Signed)
NAMECHARLEIGH, Ann [name] NO.:  [id number]   MEDICAL RECORD NO.:  [id number]          PATIENT TYPE:  INP   LOCATION:  5018                         FACILITY:  [facility]   PHYSICIAN:  Isidor Holts, M.D.  DATE OF BIRTH:  03/30/1981   DATE OF ADMISSION:  05/06/2007  DATE OF DISCHARGE:  05/10/2007                               DISCHARGE SUMMARY   PRIMARY MEDICAL DOCTOR:  Melony Overly, PA, [facility],  [place], [place].   PRIMARY NEUROSURGEON:  Dr. Hilda Lias   DISCHARGE DIAGNOSES:  1. Acute gastroenteritis.  2. Exacerbation of back pain.  3. Morbid obesity.   DISCHARGE MEDICATIONS:  1. Dilaudid 4 mg p.o. p.r.n. q.4h.  2. Percocet (5/325) one p.o. p.r.n. q.4h.  3. Valium 10 mg p.o. p.r.n. q.8h.  4. Lamictal 100 mg p.o. daily.   PROCEDURES:  None.   CONSULTATIONS:  None.   ADMISSION HISTORY:  As per H&P notes of May 06, 2007, dictated by  Dr. Della Goo.  However, in brief, this is a 30 year old female,  with known history of degenerative disk disease status post left L4-5  diskectomy, foraminotomy, and lysis of adhesions, as well as L5-S1  diskectomy and foraminotomy on April 19, 2007; morbid obesity;  bipolar disorder; and von Willebrand's disease who presents with a 3-day  history of severe nausea, vomiting and diarrhea.  She was admitted for  further evaluation, investigation and management.   CLINICAL COURSE:  #1 - ACUTE GASTROENTERITIS.  For details of  presentation, refer to admission history above.  The patient was managed  with bowel rest, intravenous fluid hydration, correction of  electrolytes, antiemetics, proton pump inhibitors, with satisfactory  clinical response.  As of May 07, 2007, a.m. diarrhea had resolved,  although the patient continued to feel nauseated.  However, by May 08, 2007, she was able to tolerate diet, had no further symptomatology.  Stool sample sent for C. difficile toxin were negative.   Conclusion is  that the patient likely had an acute viral gastroenteritis which was  self limited in nature.  The patient's hydration status remained  adequate during the course of her hospitalization.  By May 09, 2007, the patient was completely asymptomatic and keen to be discharged.   #2 - CHRONIC BACK PAIN.  The patient has a history of chronic back pain  secondary to lumbosacral DJD and is status post surgery on April 19, 2007, by Dr. Hilda Lias.  She had subsequently been on p.r.n.  analgesics which she was unable to utilize because of #1 above.  She was  managed with parenteral analgesics with good clinical response and by  May 08, 2007, we were able to transition her to her pre-admission  oral analgesics with good clinical effect.  Back pain has remained under  control, thereafter.   DISPOSITION:  The patient was on May 09, 2007, considered  sufficiently clinically recovered and stable to be discharged.  She has  therefore been discharged accordingly.   DIET:  No restrictions.   ACTIVITY:  As tolerated.   FOLLOWUP INSTRUCTIONS:  The patient is to follow  up with her primary  care Kamika Goodloe, Melony Overly, [facility], [place], [place], per  prior scheduled appointment, and also to follow up with her  neurosurgeon, Dr. Hilda Lias, per prior scheduled appointment.      Isidor Holts, M.D.  Electronically Signed     CO/MEDQ  D:  05/09/2007  T:  05/09/2007  Job:  [id number]   cc:   Melony Overly, PA  Hilda Lias, M.D.

## 2010-09-25 NOTE — Op Note (Signed)
Ann [name], Ann [name] NO.:  [id number]   MEDICAL RECORD NO.:  [id number]          PATIENT TYPE:  OIB   LOCATION:  2899                         FACILITY:  [facility]   PHYSICIAN:  Di Kindle. Edilia Bo, M.D.DATE OF BIRTH:  [birth year]   DATE OF PROCEDURE:  10/15/2004  DATE OF DISCHARGE:                                 OPERATIVE REPORT   PREOPERATIVE DIAGNOSIS:  Headaches, rule out temporal arteritis.   POSTOPERATIVE DIAGNOSIS:  Headaches, rule out temporal arteritis.   PROCEDURE:  Left temporal artery biopsy.   SURGEON:  Di Kindle. Edilia Bo, M.D.   ASSISTANT:  Rowe Clack, P.A.-C.   ANESTHESIA:  Local with sedation.   TECHNIQUE:  The patient was taken to the operating room and sedated by  Anesthesia.  Using a Doppler, the temporal artery was identified and a small  area of hair was shaved; this area was then prepped and draped in usual  sterile fashion.  The skin was infiltrated with 1% lidocaine plain and then  an incision was made over the artery and the artery was carefully dissected  out and ligated proximally and distally.  I took about a 2.5- to 3-cm  segment.  Hemostasis was obtained.  The wound was irrigated and then the  would was closed with deep layer of 3-0 Vicryl and the skin closed 4-0  Vicryl.  Specimen was sent to Pathology.  The patient tolerated the  procedure well and was transferred to the recovery room in satisfactory  condition.  All needle and sponge counts were correct.       CSD/MEDQ  D:  10/15/2004  T:  10/15/2004  Job:  [id number]

## 2010-09-25 NOTE — Op Note (Signed)
[place]  Patient:    Ann [name], Ann [name] Visit Number: [id number] MRN: [id number]          Service Type: SUR Location: 3000 [id number] Attending Physician:  Danella Penton Dictated by:   Tanya Nones. Jeral Fruit, M.D. Proc. Date: 09/12/01 Admit Date:  09/12/2001 Discharge Date: 09/16/2001                             Operative Report  PREOPERATIVE DIAGNOSES:  Left L4-5 and left L5-S1 anterior disk with chronic L5-S1 radiculopathy.  POSTOPERATIVE DIAGNOSES:  Left L4-5 and left L5-S1 anterior disk with chronic L5-S1 radiculopathy.  OPERATION/PROCEDURE:  Left L5 hemilaminectomy, diskectomy of L4-5 and L5-S1 and foraminotomy - microscopic.  SURGEON:  Tanya Nones. Jeral Fruit, M.D.  ASSISTANT:  Danae Orleans. Venetia Maxon, M.D.  INDICATIONS:  The patient is a 30 year old female who had an accident at work back in February. The patient has failed with conservative treatment including epidural selective nerve root injections. The patient wants to go ahead with surgery during the summertime, but she is getting worse and she wants to proceed. The risks were explained in the history and physical.  DESCRIPTION OF PROCEDURE:  The patient was taken to the OR and she was positioned in a prone manner. This lady is 5 feet, 2 inches and 180 pounds. We did the x-ray which showed the needle was close to L5-S1. From thereon, we made a midline incision and we went through the skin and through a thick fat tissue layer. We identified the L5 spinous process and we retracted the fascia as well as the muscle laterally. We identified L5-S1 and L4-5. Then we brought the microscope into the area. We started our dissection, but we did the hemilaminectomy of L5. The patients spine was vertical and we did have to remove a little bit more lamina medially, and then we found the yellow ligament and this was also excised. We identified L5-S1 and indeed, right at the level of the disk, there was  a large herniated disk with some scar tissue. Lysis was achieved and we entered the disk space. A large amount of degenerative disk was removed from this area. Foraminotomy was accomplished. Then we investigated the L4-5 which also we found a large herniated disk. Retraction was made. Upon retraction of the L5 nerve root, we found that part of the epineurium was a little bit opened, probably secondary to the nerve root injection. There was no CSF leak. Retraction was achieved and again we opened the disk space with a total gross diskectomy. At the end, we did a Valsalva maneuver. Foraminotomy was accomplished and there was a plain split of the L4, L5 and S1 nerve roots. From there the area was irrigated. A piece of fat was left in the epidural space and the wound was closed with Vicryl and Steri-Strips. The patient did well. Dictated by:   Tanya Nones. Jeral Fruit, M.D. Attending Physician:  Danella Penton DD:  09/12/01 TD:  09/13/01 Job: [id number]

## 2010-09-25 NOTE — Consult Note (Signed)
Ann [name], Ann [name] NO.:  [id number]   MEDICAL RECORD NO.:  [id number]                   PATIENT TYPE:  REC   LOCATION:                                       FACILITY:  [facility]   PHYSICIAN:  Zachary George, DO                      DATE OF BIRTH:  [birth year]   DATE OF CONSULTATION:  08/07/2002  DATE OF DISCHARGE:                                   CONSULTATION   PAIN AND REHABILITATIVE MEDICINE CLINIC NOTE:  The patient returns to clinic  today for reevaluation.  She was initially seen May 23, 2002.  She  continues to have some lower back pain but states that she is six weeks  pregnant and has stopped all of her medications.  She does note with her  pregnancy that the pain does not come on as frequently.  Her pain today is a  4/10 with subjective scale.  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  She states there is no light duty available  in her current job and she states she cannot lift the required 15-45 pounds.  She does not have any paperwork or anything like that for me to fill out  today and does not feel like she needs a note from me at this point.  She  denies any new neurologic complaints and I reviewed health and history form  and 14 point review of systems.   PHYSICAL EXAMINATION:  GENERAL:  Obese female in no acute distress.  VITAL SIGNS:  Blood pressure 124/56, pulse 88, respirations 16.  NEUROMUSCULAR:  No new neurologic findings in the lower extremities  including motor, sensory and reflexes.   IMPRESSION:  1. Chronic low back pain with mild left lower extremity radicular symptoms     intermittently, improved.  2. Degenerative disk disease of the lumbar spine, status post left L5     hemilaminectomy with L4-5 and L5-S1 diskectomies for disk herniations.  3. Pregnancy.   PLAN:  1. Will start physical therapy for proper body mechanics and stabilization     exercises leading to a home exercise program 2-3 times a week x4 weeks.     Caution pregnancy.  2. The patient to return to clinic in three months for reevaluation.   The patient was educated on the above findings and recommendations and  understands.  There were no barriers to communication.  Zachary George, DO    JW/MEDQ  D:  08/07/2002  T:  08/07/2002  Job:  [id number]   cc:   Hilda Lias, M.D.  [address]  [place], [place] [address]  Fax: [phone]

## 2010-09-25 NOTE — Consult Note (Signed)
NAMEKEYANNI, WHITTINGHILL NO.:  [id number]   MEDICAL RECORD NO.:  [id number]                   PATIENT TYPE:  REC   LOCATION:  TPC                                  FACILITY:  [facility]   PHYSICIAN:  Ann George, DO                      DATE OF BIRTH:  Mar 03, 1981   DATE OF CONSULTATION:  05/23/2002  DATE OF DISCHARGE:                                   CONSULTATION   NEW PATIENT EVALUATION:  Dear Dr. Jeral Fruit,   Thank you very much for kindly referring [name] to [facility].  [name] was seen in our clinic today.  Please refer to the following for details regarding the history, physical  examination and treatment plan.   Once again, thank you for allowing [place] to participate in the care of Ms.  Ann [name].   CHIEF COMPLAINT:  Low back pain.   HISTORY OF PRESENT ILLNESS:  The patient is a pleasant 30 year old right-  hand-dominant female who was evaluated in a chaperoned environment.  She is  a kind referral from Dr. Hilda Lias to evaluate for further pain  management.  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  She had low back pain with pain  radiating into her left lower extremity, down the lateral thigh and leg.  She was referred to Dr. Jeral Fruit.  MRI of the lumbar spine revealed a large  disk herniation at L4-5 with an L5-S1 disk protrusion.  He continued her on  Percocet, continued her out of work and sent her to have epidural injections  at [facility], which, according to the patient, did not really give her any relief.  She had tried multiple medications including nonsteroidal anti-inflammatory  medications including Bextra, Vioxx, Celebrex, Ultracet before her surgery  without any relief.  Dr. Jeral Fruit performed left L5 hemilaminectomy with L4-5  and L5-S1 diskectomies with  foraminotomies in May of 2003.  She states that  overall her pain is better now than it was before surgery but she continues  to have pain in her low back greater than her lower extremity.  The pain is  intermittent and worse with standing, lying down, prolonged walking and  bending.  There are no particular alleviating factors, although the patient  takes Demerol very sparingly for her pain.  She states that she is a full-  time student and the Demerol makes her very sleepy so she tends not to take  it.  She also takes Diazepam very sparingly, typically twice a week as  needed.  Her pain today is a 7/10 on a subjective scale and described as  achy, throbbing, sharp with associated numbness in the left lateral leg that  has not resolved  at this point.  Her function and quality-of-life indices  have declined to some degree.  Her sleep is fair.  She continues to be out  of work and states that she has not been releases back to work by Dr.  Jeral Fruit.  I asked her about light duty position at her previous employment  and she states that she was on light duty when she injured her lower back.  She denies bowel and bladder dysfunction, denies fevers, chills, night  sweats or weight loss.  I reviewed health and history form and 14-point  review of systems.   PAST MEDICAL HISTORY:  Denies.   PAST SURGICAL HISTORY:  Lumbar surgery as noted.   FAMILY HISTORY:  Family history of heart disease, cancer, diabetes,  disability, hypertension.   SOCIAL HISTORY:  The patient denies smoking or illicit drug use.  She admits  to occasional alcohol use.  She is single and is not currently working.  She  is a full-time Archivist at [employer], studying psychology.   ALLERGIES:  CODEINE and SULFA.   MEDICATIONS:  Diazepam and Demerol as needed.   PHYSICAL EXAMINATION:  GENERAL:  Physical examination reveals an obese  female in no acute distress.  VITAL SIGNS:  Blood pressure is 135/51, pulse  82, respirations 20, O2  saturations 98% on room air.  BACK:  Examination of the back reveals a level pelvis without scoliosis.  There is a vertical midline incisional scar which is well-healed.  There is  increased lumbar lordosis.  Palpatory examination reveals tenderness to  palpation in the left lumbar paraspinous region over the L4-5 and L5-S1  facet joint region.  Range of motion of the lumbar spine is full in all  planes with pain on left facet loading.  EXTREMITIES:  Manual muscle testing is 5/5, bilateral lower extremities.  Sensory examination reveals decrease to light touch in the left lateral leg,  which has been chronic.  Muscle stretch reflexes are 2+/4, bilateral  patellar, medial hamstrings and Achilles.  Straight leg raise is negative  bilaterally.  FABER is negative bilaterally.  Patient does have mildly tight  hamstrings and hip flexors.  No ankle clonus noted bilaterally.  No abnormal  tone in the lower extremities bilaterally.  No heat, erythema or edema in  the lower extremities.   IMPRESSION:  1. Chronic low back pain with mild left lower extremity radicular symptoms     intermittently.  2. Degenerative disk disease of lumbar spine, status post left L5     hemilaminectomy with L4-5 and L5-S1 diskectomies for disk herniations.   PLAN:  1. I discussed treatment options with the patient.  The patient has had     physical therapy in the past and has been using a TENS unit     intermittently.  She is not currently performing any exercise routine for     lumbar stabilization and we discussed this.  I would like her to     reinitiate her lumbar stabilization exercise program and discussed the     important of maintaining a program indefinitely to try to improve her     trunk support and endurance and decrease her pain level.  2. In terms of medications, I do not think that long-term opiate medications    are warranted in her case.  I would like to resume a nonsteroidal  anti-     inflammatory and will begin Vioxx 25 mg daily as needed.  3. I will give  her Vioxx 25 mg one p.o. daily as needed, #30 with 1 refill.  4. Will give her Ultracet samples to take one to two up to four times daily     as needed for pain not controlled with Vioxx, #30 samples given.  5. Consider Lidoderm patches.  6. Consider Neurontin.  7. Consider repeat selective nerve root blocks versus a trial of facet joint     injections diagnostically and therapeutically.  8. Consider a functional capacity evaluation for return-to-work strategy.  9.     Maintain contact with Dr. Jeral Fruit.  10.      Patient to return to clinic in two months for reevaluation.   Patient was educated in the above findings and recommendations and  understands.  No barriers to communication.                                               Ann George, DO    JW/MEDQ  D:  05/23/2002  T:  05/23/2002  Job:  [id number]   cc:   Hilda Lias, M.D.  [address]  [place], [place] [address]  Fax: [phone]

## 2010-09-25 NOTE — Assessment & Plan Note (Signed)
[name] returns today.  A 30 year old female 5-1/2 months postpartum,  last seen by me July 15, 2003.  She has had left buttock pain responsive to  sacroiliac joint injection but unable to confirm initial positive because of  subsequent diagnosis of von Willebrand's disease.  She is starting with  desmopressin treatment both nasal spray as well as injection.   She does have some chronic left lower extremity numbness that dates back to  lumbar laminectomy, foraminotomy, L4-5 and L5-S1.  This is not the main  cause of her discomfort.   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  She has dropped out of school because of  this.   ALLERGIES:  1. PEACHES.  2. SULFA.  3. CODEINE.   Pain levels 7 to 9/10.  She has stopped taking the Skelaxin and hydrocodone  because of drowsiness. She is caring for her 69-1/2 month-old child who  weighs 13 pounds.  She has been started by primary care on Alprazolam 0.25  q.h.s.  She does not like the way it makes her feel.  She started with the  therapist, Carollee Massed.  She no longer can take the ibuprofen per Dr.  Cyndie Chime, Mobic has not been particularly helpful.   Other medications include:  1. Flonase.  2. Desmopressin nasal spray.   REVIEW OF SYMPTOMS:  Positive for depression.  Sees a counselor now.  Poor  sleep.  No suicidal thoughts.  Some agitation.  Numbness in the left calf  laterally.  This is chronic.  Spasms in the back mostly at night.   Pain is made worse with walking, bending, sitting, therapy, lifting; tried  going through some PT which was not particularly helpful for.   PHYSICAL EXAMINATION:  VITAL SIGNS:  Blood pressure 130/81, pulse 90,  respiratory rate 14, O2 saturation 98% on room air.   Gait is without toe drag or knee instability.  Affect is alert.  Appearance  is normal.   She is slow to get up but otherwise as above.  She is obese with  valgus deformity bilateral knees.  She has no tenderness to palpation lumbar  spine.  She has full lower extremity strength.  Reduced sensation left L5  dermatome.  Internal and external rotation at the hips are normal and knee  and ankle range of motion is full.   IMPRESSION:  1. Chronic left buttock pain, probable left sacroiliac joint arthropathy.     Has had initial sacroiliac joint injection positive, however, there is a     20% false positive response to a one-time injection.  Therefore,     confirmatory injection needed and this has been delayed by her diagnosis     of von Willebrand's disease.  2. Muscle spasms mainly at night.  Will ask her to just take Flexeril at     night for this.  3. Chronic left lower extremity radiculopathy with decreased sensation L5     dermatome.  This, otherwise is not causing her any disabling symptoms.  4. Discussed alternative treatments including chiropractic.  Have asked her     to use the lidocaine patches on a consistent basis and use them at night.  5. Recommending repeat sacroiliac joint injection and if this confirms     diagnosis, then she may benefit from sacroiliac denervation with  radiofrequency.   In terms of return to work, I think this is really symptomatic in nature.  There is no contraindication to sedentary position and it is just a matter  of her getting through the day with this in terms of pain.  She does need  the flexibility of being able to get up and down on ad lib basis as this is  helpful for her.   I will see her back in one month.  Will assess her response to Ultram 50 mg  t.i.d. as well as the Flexeril q.h.s. and Lidoderm patch on q.h.s. and off  q.a.m.      Erick Colace, M.D.   AEK/MedQ  D:  08/26/2003 11:23:38  T:  08/26/2003 14:35:41  Job #:  [id number]   cc:   Genene Churn. Cyndie Chime, M.D.  [address] [facility]  [place]  [place]  [address]  Fax: [phone]   Carollee Massed, PT   Dr. Jinny Sanders

## 2010-09-25 NOTE — H&P (Signed)
NAMEMARASIA, NEWHALL               ACCOUNT NO.:  [id number]   MEDICAL RECORD NO.:  [id number]          PATIENT TYPE:  INP   LOCATION:  A326                          FACILITY:  [facility]   PHYSICIAN:  Madelin Rear. Sherwood Gambler, MD  DATE OF BIRTH:  1981-04-03   DATE OF ADMISSION:  10/18/2004  DATE OF DISCHARGE:  LH                                HISTORY & PHYSICAL   CHIEF COMPLAINT:  Headache.   HISTORY OF PRESENT ILLNESS:  The patient has had intractable headache x2  weeks.  She has had emergency room as well as office visits for same.  She  describes the pain located in the left frontal area and retro-orbital area.  She also has some radiation of the pain to the left temple.  On outpatient  assessment, she had an elevated sed rate in the 50's, raising the clinical  specter of possible temporal  arteritis.  She underwent a temporal artery  biopsy Thursday last week in Dr. Darletta Moll office last in [place].  Pathology is unavailable at present, will be reviewed as soon as it is  available, hopefully today.  What prompted another emergency room visitation  and subsequent admission was severe unrelenting pain unresponsive to Tylox  and multiple other analgesics orally.   PAST MEDICAL HISTORY:  Sacral ileitis status post steroid injections under  fluoroscopy 2005.  This also was repeated at Dr. Wynn Banker' in January 2006.   SOCIAL HISTORY:  Nonsmoker, nondrinker.  No alcohol or other drug use.   FAMILY HISTORY:  Noncontributory.   REVIEW OF SYSTEMS:  As under HPI, otherwise negative.   PHYSICAL EXAMINATION:  VITAL SIGNS:  She is afebrile with normal vital  signs.  HEENT:  No JVD or adenopathy.  NECK:  Supple.  CHEST:  Clear.  CARDIAC:  Regular rhythm.  No murmurs, gallops, rubs.  ABDOMEN:  Benign, soft.  No organomegaly or mass.  EXTREMITIES:  Without cyanosis, clubbing or edema.  NEUROLOGICAL:  Nonfocal.   LABORATORY DATA:  As and outpatient, MRI of the brain and CT of the brain  which was  unrevealing.  Temporal artery biopsy as annotated above.   IMPRESSION:  Intractable headache.  This could be cluster, could be severe  migraine.  Most infectious etiologies have been excluded based on the time  course and lack of fever or adenopathy, skin rash or tick bite.  She is  admitted mostly for analgesia.  Will consult Neurology as well, and I will  hunt down the pathology report on the temporal artery biopsy.       LJF/MEDQ  D:  10/19/2004  T:  10/19/2004  Job:  [id number]

## 2010-09-25 NOTE — Procedures (Signed)
NAME:  Ann [name], Ann [name]                         ACCOUNT NO.:  [id number]   MEDICAL RECORD NO.:  [id number]                   PATIENT TYPE:  REC   LOCATION:  TPC                                  FACILITY:  [facility]   PHYSICIAN:  Erick Colace, M.D.           DATE OF BIRTH:  1980-12-09   DATE OF PROCEDURE:  01/02/2004  DATE OF DISCHARGE:                                 OPERATIVE REPORT   MEDICAL RECORD NUMBER:  [id number].   [name] is a 30 year old female last seen by me December 16, 2003. She is  here for repeat left sacroiliac joint injection. Had a positive response to  the last one which was done in February.   Known von Willebrand disease. Administrated herself DDAVP approximately one  hour prior to injection.   Informed consent was obtained after explaining risks and benefits of the  procedure to the patient. These included bleeding, bruising, infection,  nerve damage to the left sciatic nerve causing temporary or permanent  paresis, or increased pain. She elects to proceed and has given written  consent.   The patient was placed prone on the fluoroscopy table. Betadine prep,  sterile drape. An 1-1/4-inch 25-gauge needle was used in the SI skin and  subcu tissues with 4 cc of 1% lidocaine. Then a solution containing 0.75 cc  of Kenalog 40 mg/cc and 0.75 cc methylparaben-free lidocaine 2% lidocaine  was injected after proper needle placement under multiple fluoroscopic  views. The placement was confirmed by contrast injection of Omnipaque 180  x0.5 cc. The patient tolerated the procedure well. Post injection  instructions given. She is to return to see me in one month. Will reassess  pain levels and decided whether sacroiliac RF is indicated.                                                Erick Colace, M.D.    AEK/MEDQ  D:  01/02/2004 15:28:04  T:  01/03/2004 15:45:54  Job:  [id number]

## 2010-09-25 NOTE — Assessment & Plan Note (Signed)
[name] follows up after I last saw her May 17, 2003.  She is here  for a left sacroiliac joint injection for chronic left buttock pain.  This  has persisted despite medications including oxycodone at night.  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   Informed consent was obtained after describing risks and benefits of the  procedure to the patient.  Made clear that this was a diagnostic injection.  Risks included bleeding, bruising, infection, paralysis, loss of sensation  in the lower extremity.  The patient wished to proceed.   The patient placed prone on the fluoroscopy table.  Sterile prep and drape.  Skin and subcutaneous tissues anesthetized with 25-gauge needle, one quarter  inch with 2.5 mL of 1% lidocaine.  The 22-gauge spinal needle was  manipulated under fluoroscopic guidance into the left SI joint.  There was  no intravascular uptake with live injection of Omnipaque 180 into the joint.  Then a 2% solution of methylparaben-free lidocaine were injected into the  joint.  The patient tolerated the procedure well.  Post injection  instructions given.  No complications.  Will see her back in two weeks.  Possible reinjection, may add corticosteroid if this was a positive test  block.      Erick Colace, M.D.   AEK/MedQ  D:  06/04/2003 10:19:24  T:  06/04/2003 10:54:38  Job #:  [id number]

## 2010-09-25 NOTE — Group Therapy Note (Signed)
[name], Ann [name]               ACCOUNT NO.:  [id number]   MEDICAL RECORD NO.:  [id number]          PATIENT TYPE:  INP   LOCATION:  A326                          FACILITY:  [facility]   PHYSICIAN:  Kofi A. Gerilyn Pilgrim, M.D. DATE OF BIRTH:  [birth year]   DATE OF PROCEDURE:  DATE OF DISCHARGE:                                   PROGRESS NOTE   She continues to report headache although she appears to have had some  improvement today with the severely being 7-8; however, she continues to  have left-sided headaches that seem to return after medications were off.  The patient did not get the dramatic response after the spinal tap, which is  very typical of headache due to pseudotumor cerebri.  A magnetic resonance  angiography was obtained, which showed some possible beading that may be  seen in arteritis.  This is concerning, given the elevated sed rate of 60.  Her spinal fluid analysis was also not completely normal, showing a WBC of  11 and an RBC of 35.   ASSESSMENT/PLAN:  Recalcitrant unilateral headache with elevated spinal  fluid pressure.  The elevated spinal fluid pressure can be seen with any  intracranial abnormality.  Given the lack of response to spinal fluid  removal, which is typical of pseudotumor cerebri and the suspicious MRA  findings, believe she ought to undergo further definitive testing, including  angiography and possibly meninges and brain biopsy.  We will probably start  her off first with the angiography.  The benefits and procedure were  discussed at length with the patient and mother, including risks of serious  complications.  The dose of Solu-Medrol will be increased 1 gm per day, and  we will also check for ANA, RPR, and rheumatoid factor.       KAD/MEDQ  D:  10/22/2004  T:  10/22/2004  Job:  [id number]

## 2010-09-25 NOTE — H&P (Signed)
NAME:  Ann [name], Ann [name]                         ACCOUNT NO.:  [id number]   MEDICAL RECORD NO.:  [id number]                   PATIENT TYPE:  OBV   LOCATION:  9156                                 FACILITY:  [facility]   PHYSICIAN:  Hal Morales, M.D.             DATE OF BIRTH:  02/07/1981   DATE OF ADMISSION:  03/21/2003  DATE OF DISCHARGE:                                HISTORY & PHYSICAL   [name] is a 30 year old single black female, gravida 2, para 0-0-1-0,  at 40-2/7 weeks by ultrasound, who presented initially around 6 p.m. today  from the office after being evaluated there for complaints of decreased  fetal movement.  She has had reports of decreased fetal movement over the  last several days and is quite concerned about the status of her baby.  She  has had NSTs in the past that were reactive, but today her NST at the office  was nonreactive and she was sent over to maternity admissions for further  monitoring and a biophysical profile.  She denies any nausea, vomiting,  headaches, or visual disturbances.  She also denies any leaking or vaginal  bleeding.  Her pregnancy has been followed at [facility] by the  M.D. service and has been at risk for:   1. A history of obesity with this pregnancy.  2. History of asthma though never officially diagnosed.  3. History of abnormal LMP.  4. 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  5. Her group B strep is negative.    PAST OBSTETRICAL/GYNECOLOGIC HISTORY:  She is a gravida 2, para 0-0-1-0, had  one elective AB in 1997 with no complications.  Other GYN history is a  history of heavy bleeding on Depo-Provera.   GENERAL MEDICAL HISTORY:  She is allergic to CODEINE, it gives her nausea  and a rash.  SULFA also gives her nausea and a rash, and DARVOCET gives her  a rash.   She reports having had the usual childhood diseases.  She reports a history  of anemia in the past and  a possible diagnosis of asthma though has never  been officially diagnosed.  She also reports occasional urinary tract  infections and she reports her only hospitalization was for back surgery in  2003.  Her only other surgery was wisdom teeth removed in 2001.   FAMILY HISTORY:  Significant for paternal grandfather with MI and heart  disease in mother.  Maternal grandmother, aunt, and maternal grandfather  with chronic hypertension.  Maternal grandfather and paternal grandfather  with insulin-dependent diabetes.  Mother with thyroid disease.  Maternal  grandmother, maternal aunt, and mother with migraine headache.   GENETIC HISTORY:  Negative.   SOCIAL HISTORY:  She is single.  The father of the baby is Ann [name].  He is involved and supportive.  He is employed full-time.  She is currently  unemployed.  Her family is very supportive.  She is of the [employer].  She denies any illicit drug use, alcohol, or smoking with this pregnancy.   PRENATAL LABORATORY DATA:  Her blood type is O positive, her antibody screen  is negative.  Sickle cell trait is negative.  Syphilis is nonreactive.  Rubella is immune.  Hepatitis B surface antigen is negative.  GC and  Chlamydia are both negative.  Pap had benign reactive changes in [place] 2004.  Her varicella was nonimmune, and she declined maternal serum alpha-  fetoprotein, and her 36-weeks beta strep was negative.   PHYSICAL EXAMINATION:  VITAL SIGNS:  Stable.  She is afebrile.  HEENT:  Grossly within normal limits.  CARDIAC:  Her heart is regular rhythm and rate.  CHEST:  Clear.  BREASTS:  Soft and nontender.  ABDOMEN:  Gravid with uterine contractions every four to six minutes that  are mild.  Her fetal heart rate has required several hours of monitoring  prior to showing reactivity, though around 10:30 p.m. she did have  reactivity and no decelerations have been noted throughout her stay.  Her  uterine contractions are every four to  six minutes and mild.  PELVIC:  Her speculum exam shows no lesions.  Her cervix is closed, 50%, and  vertex -3 and posterior.  EXTREMITIES:  Within normal limits.   Biophysical profile done today showed a score of 6 out of 8 with no fetal  breathing movements noted that lasted 20 seconds, and her AFI was 17.6.   ASSESSMENT:  1. Intrauterine pregnancy at 40-2/7 weeks.  2. Unfavorable cervix.  3. Prolonged nonstress test prior to eliciting reactivity.  4. Maternal anxiety regarding her fetal status.  5. 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  6. Negative group B Streptococcus.   PLAN:  Her plan per consultation with Dr. Dierdre Forth is to admit for  observation overnight.  The risks and benefits of induction were reviewed  again with the patient, particularly in regard secondary to her unfavorable  cervix, causing increased risk of cesarean section.  The patient states that  she would rather proceed with a cesarean section than risk anything  happening to her baby by going home at this time.  The decision at this  point, then, was made to just admit her for further observation and to re-  evaluate things in the morning.     Concha Pyo. Duplantis, C.N.M.              Hal Morales, M.D.    SJD/MEDQ  D:  03/21/2003  T:  03/22/2003  Job:  [id number]

## 2010-09-25 NOTE — Op Note (Signed)
NAME:  Ann [name], Ann [name]                         ACCOUNT NO.:  [id number]   MEDICAL RECORD NO.:  [id number]                   PATIENT TYPE:  INP   LOCATION:  9145                                 FACILITY:  [facility]   PHYSICIAN:  Janine Limbo, M.D.            DATE OF BIRTH:  1980/08/03   DATE OF PROCEDURE:  03/23/2003  DATE OF DISCHARGE:                                 OPERATIVE REPORT   PREOPERATIVE DIAGNOSES:  1. Term intrauterine pregnancy.  2. Gestational hypertension.  3. Obesity (weight 252 pounds).  4. Failure to progress in labor.   POSTOPERATIVE DIAGNOSES:  1. Term intrauterine pregnancy.  2. Gestational hypertension.  3. Obesity (weight 252 pounds).  4. Failure to progress in labor.   PROCEDURES:  Primary low transverse cesarean section.   SURGEON:  Janine Limbo, M.D.   FIRST ASSISTANT:  Cam Hai, C.N.M.   ANESTHETIC:  Spinal.   DISPOSITION:  [name] is a 30 year old female, gravida 2, para 0-0-1-0,  who presents at [redacted] weeks gestation.  She received Cervidil for induction for  two days and did not progress.  On March 23, 2003, the patient declined  further attempts at induction and requested primary cesarean delivery.  She  understood the indications for her procedure and she accepted the risks of,  but not limited to anesthetic complications, bleeding, infections, and  possible damage to the surrounding organs.   FINDINGS:  A 7 pound 3 ounce female infant (Autumn) was delivered from the  cephalic presentation.  The Apgars were 8 at one minute and 9 at five  minutes.  The uterus, fallopian tubes, and the ovaries were normal for the  gravid state.   DESCRIPTION OF PROCEDURE:  The patient was taken to the operating room where  a spinal anesthetic was given.  The patient's abdomen and perineum were  prepped with multiple layers of Betadine.  A Foley catheter was placed in  the bladder.  The patient was sterilely draped.  The lower abdomen was  injected with 20 mL of 0.25% Marcaine with epinephrine.  A low transverse  incision was made in the abdomen and carried sharply through the  subcutaneous tissue, the fascia, and the anterior peritoneum.  An incision  was made on the lower uterine segment and extended transversely.  The fetal  head was delivered with the assistance of a Mityvac vacuum extractor.  The  mouth and nose were suctioned.  A DeLee trap was also used to suction the  infant.  The infant was delivered and handed to the awaiting pediatric team.  Routine cord blood studies were obtained.  The placenta was removed.  The  uterine cavity was thoroughly cleaned.  The uterine incision was closed  using a running locking suture of 2-0 Vicryl followed by an imbricating  suture of 2-0 Vicryl.  Hemostasis was noted to be adequate.  The peritoneal  cavity was vigorously irrigated.  Hemostasis  was adequate.  The anterior  peritoneum and the abdominal musculature were reapproximated in the midline  using 2-0 Vicryl.  The fascia was closed using a running suture of 0 Vicryl  followed by three interrupted sutures of 0 Vicryl.  The subcutaneous area  was irrigated.  Hemostasis was confirmed.  A Jackson-Pratt drain was placed  in the subcutaneous layer and brought out through the left lower quadrant.  It was sutured into place using 3-0 silk.  The subcutaneous layer was closed  using a running suture of 0 Vicryl.  The skin was reapproximated using skin  staples.  Sponge, needle, and instrument counts were correct on two  occasions.  The estimated blood loss was 800 mL.  The patient tolerated her  procedure well.  The patient was transported to the recovery room in stable  condition.  The infant was taken to the full-term nursery in stable  condition.                                               Janine Limbo, M.D.    AVS/MEDQ  D:  03/23/2003  T:  03/23/2003  Job:  [id number]

## 2010-09-25 NOTE — Procedures (Signed)
NAMEMAJESTA, LEICHTER NO.:  [id number]   MEDICAL RECORD NO.:  [id number]          PATIENT TYPE:  REC   LOCATION:  TPC                          FACILITY:  [facility]   PHYSICIAN:  Erick Colace, M.D.DATE OF BIRTH:  1980/09/20   DATE OF PROCEDURE:  05/14/2004  DATE OF DISCHARGE:                                 OPERATIVE REPORT   PROCEDURE:  Left sacroiliac joint injection under fluoroscopic guidance.   INDICATION:  Previous relief, temporary, with March 03, 2004 injection,  and at least a two-month relief with the December 16, 2003 injection.  Informed  consent was obtained.  Written consent was provided.   DESCRIPTION OF PROCEDURE:  Patient placed prone on fluoroscopy table.  Betadine drape, sterile drape.  A 25 gauge 1-1/4-inch  needle was used to  anesthetize the skin and subcutaneous tissue, and a 22 gauge 3-1/2 inch  spinal needle was inserted under fluoroscopic guidance in the left SI joint.  Both AP and lateral imaging was obtained, then live fluoroscopic injection  of Omnipaque 180.  Then a solution containing 0.5 mL of 40 mg/mL Kenalog  plus 0.5 mL of 2% lidocaine was injected.  The patient tolerated the  procedure well.  Post injection instructions given.  Patient to return in  one month for followup.      Andr   AEK/MEDQ  D:  05/14/2004 12:58:42  T:  05/14/2004 13:13:47  Job:  [id number]

## 2010-09-25 NOTE — Discharge Summary (Signed)
[place]. [facility]  Patient:    Ann [name], BOLD Visit Number: [id number] MRN: [id number]          Service Type: SUR Location: [id number] Attending Physician:  Danella Penton Dictated by:   Danae Orleans. Venetia Maxon, M.D. Admit Date:  09/12/2001 Discharge Date: 09/16/2001                             Discharge Summary  HISTORY OF PRESENT ILLNESS: This is a patient of Dr. Marlowe Sax.  She is a 30 year old with back and left leg pain.  She has injured herself while at work and developed the sudden onset of back pain and pain down the left hip and posterolateral leg.  She is 52" and 180 pounds.  She is ALLERGIC to SULFA and CODEINE.  She had an otherwise normal examination with the exception of a positive straight leg raise on the left greater than right and weakness in dorsiflexion and plantar flexion on the left foot.  HOSPITAL COURSE: The patient was admitted on a same days procedure basis and underwent L4-5 and L5-S1 diskectomy by Dr. Jeral Fruit.  This was performed on the left side.  She did well with this procedure. She was slow to mobilize and was still complaining of back pain.  She was doing better on May 10 and discharged home at that point.  FOLLOW-UP:  She was given instructions to follow-up with Dr. Jeral Fruit in three weeks. Dictated by:   Danae Orleans Venetia Maxon, M.D. Attending Physician:  Danella Penton DD:  11/07/01 TD:  11/08/01 Job: [id number]

## 2010-09-25 NOTE — Op Note (Signed)
NAMEJERSEY, ESPINOZA               ACCOUNT NO.:  [id number]   MEDICAL RECORD NO.:  [id number]          PATIENT TYPE:  INP   LOCATION:  A326                          FACILITY:  [facility]   PHYSICIAN:  Kofi A. Gerilyn Pilgrim, M.D. DATE OF BIRTH:  Dec 24, 1980   DATE OF PROCEDURE:  10/21/2004  DATE OF DISCHARGE:                                  PROCEDURE NOTE   This is a 30 year old lady who has persistent throbbing headache on the left  that is severe.  She still complains of a headache today.  It continues to  remain unrelenting.   PROCEDURE:  Informed consent was obtained in the usual fashion for a lumbar  spinal tap.  The patient was placed in lying position and the area prepped  in the usual fashion.  The L4-5 interspace was entered on the third attempt  using a 20-guage needle, 3.5 cm in length.  The fluid was clear but came up  rapidly.  Opening pressure was obtained and measured 32.5 cm water.  Approximately 33 mL of fluid was removed.  The closing pressure was 6.5 cm  water.  The patient tolerated the procedure well.  No immediate  complications.  She still complained of a headache, probably slightly  better, 7/10 on the left side.       KAD/MEDQ  D:  10/21/2004  T:  10/21/2004  Job:  [id number]

## 2010-09-25 NOTE — Assessment & Plan Note (Signed)
HISTORY:  [name] returns after I last saw her December 24, 2002, in the  intervening time she gave birth to her daughter in November.  She had a C-  section but had no postoperative complications.  She has noted increased  back pain since that time especially with lifting her 10-pound baby, her  mother has to assist her frequently.  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  She complains that parking is  difficult in that area and that she at times needs to park up to 3 miles  away.  She has been using a temporary handicapped parking sticker.  She was  restarted on narcotic analgesics by her OB/GYN but states that they just  make her tired and do not help that much more than the ibuprofen for her  pain, she takes ibuprofen 800 t.i.d. and Percocet she takes one to two a day  and the hydrocodone she takes about one a day.   She has not seen Dr. Leonides Cave for neuropsychology in a return visit yet but is  interested in doing so.   REVIEW OF SYSTEMS:  She is not breast-feeding.  She has back pain made worse  with prolonged standing but also with prolonged sitting.   SOCIAL:  Relates that this is a contested work-related injury and that she  has a form for me to complete today.   EXAMINATION:  GENERAL:  No acute distress.  Mood and affect appropriate.  Blood pressure 139/61, pulse 79, O2 saturation 98% room air.   BACK:  Mild tenderness to palpation PSIS bilaterally.  She has pain over the  left gluteus medius area.  She has normal spine range of motion.  Normal  lower extremity strength.  Negative straight leg raise.  Normal deep tendon  reflexes bilateral lower extremities.   IMPRESSION:  History of lumbar disk status post left L5 hemilaminectomy, L4-  5 and L5-S1 diskectomies for lumbar disk herniation, original injury January  2003.  Her main pain is over the posterior superior iliac spine area right  now and I question whether she may have some  sacroiliac involvement.  As I  discussed with patient the only way to accurately diagnose would be to  anesthetize the left sacroiliac joint given that she has primary left-sided  symptoms, if this is positive and is reproducible may benefit from  sacroiliac radiofrequency.  She is complaining of pain that is severe in the  range of 7-8/10.   In terms of medications, start oxycodone 5 mg two tablets at night.  Do not  take it during the day given her other responsibilities.   States that she has had good results from Vioxx in the past.  Given this is  off the market, we will start her on Mobic 7.5 once daily and substitute for  ibuprofen.   I will see her back for the sacroiliac injection.   In terms of work, I think she can do sedentary type work without aggravating  her condition.   I will see her back for the injection.      Erick Colace, M.D.   AEK/MedQ  D:  05/17/2003 17:02:50  T:  05/17/2003 38:25:05  Job #:  [id number]

## 2010-09-25 NOTE — Discharge Summary (Signed)
NAMEDURU, REIGER               ACCOUNT NO.:  [id number]   MEDICAL RECORD NO.:  [id number]          PATIENT TYPE:  INP   LOCATION:  A326                          FACILITY:  [facility]   PHYSICIAN:  Madelin Rear. Sherwood Gambler, MD  DATE OF BIRTH:  [birth year]   DATE OF ADMISSION:  10/18/2004  DATE OF DISCHARGE:  06/19/2006LH                                 DISCHARGE SUMMARY   DISCHARGE DIAGNOSES:  1.  Pseudotumor cerebri with intractable headache.  2.  Cerebral arteritis.   DISCHARGE MEDICATIONS:  1.  Topamax.  2.  Dilaudid 2-4 mg q.4h. p.r.n. pain.   HOSPITAL COURSE:  The patient was admitted with intractable headache.  She  had multiple emergency room and office visitations for same.  It failed to  respond to opiates or steroids.  She was subsequently admitted for  intractable pain and analgesia.  She was seen in consultation by neurology.  She underwent magnetic resonance of the brain to evaluate for a cerebral  venous thrombosis which was negative.  Subsequently, she underwent a lumbar  puncture which revealed a markedly elevated opening pressure consistent with  pseudotumor cerebri.  There was no cellular pleocytosis.  She was  subsequently discharged for followup in office and neurology with improved  headache.       LJF/MEDQ  D:  11/13/2004  T:  11/13/2004  Job:  [id number]

## 2010-09-25 NOTE — Consult Note (Signed)
NAMEKARSTEN, VAUGHN               ACCOUNT NO.:  [id number]   MEDICAL RECORD NO.:  [id number]          PATIENT TYPE:  INP   LOCATION:  A326                          FACILITY:  [facility]   PHYSICIAN:  Kofi A. Gerilyn Pilgrim, M.D. DATE OF BIRTH:  [birth year]   DATE OF CONSULTATION:  DATE OF DISCHARGE:                                   CONSULTATION   IMPRESSION:  The patient's clinical picture is concerning for cerebral  venous thrombosis, given the unilateral features, gender, and history of  birth control use.  Additionally, her depographics also point to the  possibility of pseudotumor cerebri.  Her age makes temporal arteritis very  unlikely.  In any case, it has been ruled out by negative temporal artery  biopsy.   RECOMMENDATIONS:  1.  Magnetic resonance venogram of the brain to evaluate for cerebral venous      thrombosis.  If this is unremarkable, we will suggest doing a spinal tap      to measure the intracranial pressure.  2.  I wanted to go ahead and treat her empirically with Lovenox 6 mg subcu      q.12h.  3.  She responded well to Dilaudid before.  We will restart this.  She did      have some itching, and we will give her Benadryl to be taken for the      itching.   HISTORY:  A 30 year old African-American female who does not have a history  of episodic headaches over the past two weeks; however, she has had severe  unilateral throbbing headache involving the temporal and periorbital regions  radiating into the occipital region.  She had a workup, which included a sed  rate of 16 and was sent for temporal artery biopsy.  The patient does not  know the results of it, although we went back on the computer, apparently  came back negative.  The patient reports having monocular oscillopsia on the  left with her symptoms.  She also reports having lightheadedness and had a  bout of epithesis on the left side.  The patient does not report smoking,  but she does use birth control.  The  patient has been given multiple  medications; however, her headaches have been recalcitrant to therapy.  She  indicates that nothing has helped so far except Dilaudid, which has helped  modestly.   PAST MEDICAL HISTORY:  Significant for sacroiliitis, status post steroid  injection.   SOCIAL HISTORY:  Essentially unremarkable.  She does not smoke, drink, or  use illicit drugs.   FAMILY HISTORY:  Unremarkable.   PHYSICAL EXAMINATION:  VITAL SIGNS:  Temperature shows a T max of 99.2,  pulse 97, respiratory rate 20, blood pressure 133/76.  GENERAL:  A morbid obesity, pleasant lady in no acute distress.  HEENT:  Head is normocephalic and atraumatic.  She does have a large tongue  base and carotid posterior air space.  NECK:  Supple.  MENTATION:  She is awake and alert.  Speech, language, cognition are intact.  Cranial nerve evaluation shows that pupils are 4 mm and briskly reactive.  Extraocular movements are intact.  Visual fields are full.  Funduscopic  examination shows clear disk margins; however, I do not see evidence of  spontaneous venous pulsations.  Facial muscle strength is symmetric and  normal.  Tongue is midline.  Uvula is midline.  Shoulder shrug is normal.  Motor examination shows normal tone, bulk, and strength.  There is no  pronator drift.  Coordination is intact.  Reflexes are 2+ and symmetric.  Plantar reflexes are both downgoing.  Coordination is intact.   Head CT scan of the brain is negative and showed no acute process.   Temporal artery biopsy done a few days ago is negative.   Sed rate 43.  WBC 15, hemoglobin 11, platelet count 438.  Normal  differential.  INR 1.  Sodium 136, potassium 4, chloride 108, BUN 10,  creatinine 0.6.  Calcium 8.  Urinalysis negative.   Thank you for this consultation.       KAD/MEDQ  D:  10/19/2004  T:  10/19/2004  Job:  [id number]

## 2010-09-25 NOTE — Discharge Summary (Signed)
NAME:  MERARY, GARGUILO                         ACCOUNT NO.:  [id number]   MEDICAL RECORD NO.:  [id number]                   PATIENT TYPE:  INP   LOCATION:  9145                                 FACILITY:  [facility]   PHYSICIAN:  Hal Morales, M.D.             DATE OF BIRTH:  [birth year]   DATE OF ADMISSION:  03/21/2003  DATE OF DISCHARGE:                                 DISCHARGE SUMMARY   ADMISSION DIAGNOSES:  1. Intrauterine pregnancy at 40-2/7 weeks.  2. Unfavorable cervix.  3. Prolonged NST before eliciting reactivity.  4. Maternal anxiety regarding fetal status.  5. 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  6. Group B Strep negative.   DISCHARGE DIAGNOSES:  1. Intrauterine pregnancy at 40-2/7 weeks.  2. Unfavorable cervix.  3. Prolonged NST before eliciting reactivity.  4. Maternal anxiety regarding fetal status.  5. 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  6. Group B Strep negative.  7. Gestational hypertension without evidence of preeclampsia.  8. Failure to progress.  9. Obesity.   PROCEDURE:  1. Spinal anesthesia.  2. Primary low transverse cesarean section.  3. Removal of JP drain.   HOSPITAL COURSE:  The patient was admitted for fetal evaluation secondary to  a nonreactive NST at the office and decreased fetal movement.  She had a  prolonged NST which became reactive and did not show any decelerations.  Uterine contractions were every four minutes and mild.  Cervix was closed,  long, -3 station, and posterior.  Discussion was had with the patient  regarding her options and due to patient's anxiety about fetal status and  fear of labor, the patient decided to proceed with a primary low transverse  cesarean section which was performed by Janine Limbo, M.D. under  spinal anesthesia with no complications for a viable female infant named  Autumn Rain weighing 7 pounds 3 ounces, Apgars 8 and 9.  The patient did  well over the  next three days in recovery.  Hemoglobin went from 11 to 10.1.  The patient was without any hemodynamic compromise.  Heart rate was regular  rate and rhythm.  Lungs were clear.  Abdomen remained soft and appropriately  tender.  Incision was clean, dry, and intact.  JP drain drained only small  amounts of serosanguineous liquid and was removed on postoperative day #3.  On postoperative day #1 and 2 patient's blood pressures remained generally  within normal limits and on postoperative day #3 they were elevated slightly  at 134-146/83-98.  There was trace to 1+ edema.  PIH laboratories done on  March 22, 2003 were within normal limits and diuresis was progressing.  The patient was deemed to have received the full benefit of her hospital  stay and was discharged home.   DISCHARGE MEDICATIONS:  1. Motrin 600 mg p.o. q.6h. p.r.n.  2. Tylox one to two p.o. q.4h. p.r.n.  3. Micronor one p.o. daily.   FOLLOWUP:  Discharge follow-up to be on November 23 at 2:30 p.m. for a blood  pressure check in the office.  Discharge follow-up in one week and then in  six weeks at [facility] or p.r.n.   DISCHARGE INSTRUCTIONS:  Per [facility] handout with the additional instructions  for [facility] precautions.     Marie L. Williams, C.N.M.                 Hal Morales, M.D.    MLW/MEDQ  D:  03/26/2003  T:  03/26/2003  Job:  [id number]

## 2010-09-25 NOTE — Assessment & Plan Note (Signed)
HISTORY:  Ann [name] is a 30 year old female 4 months postpartum who was  last seen by me June 04, 2003 for a sacroiliac injection on the left side  for her chronic left buttock pain.  She had good results, at least for the  several hours after the injection in terms of her pain, and felt like the  medication worked.  She is here for reinjection to confirm the initial  positive.  However, in the intervening time she has been diagnosed with Von  Willebrandt's disease.  She has not followed up yet with her hematologist.  She has had some abnormal postpartum bleeding that prompted this  investigation.  She did not have any bleeding problems with the last  injection.   MEDICATIONS:  She was prescribed hydrocodone 5/500 by her OB.  She takes one  tablet three times a day.  She was told not to take her ibuprofen and Mobic  together.  She had some Percocet left over from her postpartum period but  has not used these.  She continues to use Lidoderm patch over her left SI  area.   She has no pain going down into the foot.   Pain level going from 6-10/10 and averaging an 8.   ALLERGIES:  1. PEACHES.  2. SULFA.  3. CODEINE.   REVIEW OF SYSTEMS:  Positive for depression, poor sleep, agitation.  Denies  any suicidal ideation.  She has numbness and some back spasms and some  dizziness.   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  She lives with her boyfriend.   EXAMINATION:  Blood pressure 104/54, pulse 94, respirations 14, O2  saturation 97% on room air.  Her back has mild tenderness to palpation L4-  L5, paraspinals, and left PSIS area.  She has pain with forward flexion at  around half range.  She also has half range in terms of extension and  lateral bending.  She has normal deep tendon reflexes, normal strength  bilateral lower extremities, and no pain to palpable bilateral lower  extremities.  She has normal range of motion bilateral lower  extremities.   IMPRESSION:  1. Chronic left buttock pain/low back pain, probable left sacroiliac joint     arthropathy.  Given her new diagnosis of Von Willebrandt's disease would     hold off on confirmatory injection until okayed by Dr. Cyndie Chime.  2. Given muscle spasm component of her pain, will add Flexeril 5 p.o. t.i.d.  3. Have asked that she only gets pain medicine through our clinic and for     the previously-prescribed Vicodin will put her on one p.o. t.i.d.  4. Will send her to physical therapy twice a week x2 weeks now that we have     a more solid diagnosis in terms of etiology of her low back pain.   Discussed alterative treatments including chiropractic.  Continue Lidoderm  patches.  I will see her back in 1 month.  Will see the results of  hematology evaluation and treatment and decide whether reinjection will be  necessary at that time or whether we have to hold off indefinitely.   As discussed with the patient I feel that the patient will have some long-  term problems related to this buttock pain; however, I do not think is  should prohibit her from doing any sedentary or light-duty-type work.      Erick Colace, M.D.   AEK/MedQ  D:  07/15/2003  11:01:53  T:  07/15/2003 11:56:25  Job #:  [id number]   cc:   Genene Churn. Cyndie Chime, M.D.  [address] [facility]  [place]  [place] [address]  Fax: [phone]

## 2010-09-25 NOTE — H&P (Signed)
[place]. [facility]  Patient:    Ann [name], Ann [name] Visit Number: [id number] MRN: [id number]          Service Type: SUR Location: [id number] Attending Physician:  Danella Penton Dictated by:   Tanya Nones. Jeral Fruit, M.D. Admit Date:  09/12/2001                           History and Physical  HISTORY OF PRESENT ILLNESS: [name] is a 30 year old lady, who had been seen since February 2003 because of back and left leg pain.  According to the patient, in January 2003 she was at work and she was lifting and suddenly developed sudden onset of back pain that went down to the hip and then posterolaterally to the left leg.  She had been unable to work.  She has had conservative treatment and she is not any better.  The patient had an MRI and because of no improvement she is being admitted for surgery.  PAST MEDICAL HISTORY: Negative except for injury to the shoulder back in October 2002.  ALLERGIES:  1. SULFA.  2. CODEINE.  SOCIAL HISTORY: Drinks socially.  Does not smoke.  FAMILY HISTORY: Negative.  REVIEW OF SYSTEMS: Back and left leg pain.  PHYSICAL EXAMINATION:  GENERAL: The patient came to my office and limped from the left leg.  VITAL SIGNS: Height 5 feet 2 inches.  Weight 180 pounds.  HEENT: Normal.  NECK: Normal.  LUNGS: Clear.  CARDIAC: Heart sounds normal.  ABDOMEN: Normal.  EXTREMITIES: Normal pulses.  NEUROLOGIC: Mental status normal.  Cranial nerves normal.  Strength 5/5 except she has weakness of dorsiflexion and plantar flexion of the left foot. Reflexes normal.  Sensation normal.  Straight-leg-raise on the right side is positive at 80 degrees, left side positive at 45 degrees.  LABORATORY DATA: MRI showed she has a large herniated disk at the level of 4-5 with displacement of the thecal sac and also at the level of 4-1 she has a left paracentral disk which compromises the S1 nerve root.  CLINICAL IMPRESSION: Left  4-5, L5-S1 herniated disk.  PLAN: With this lady we have to be conservative because of her age. Nevertheless, she is not any better.  She is going to be admitted for a left 4-5 and 5-1 diskectomy.  She knows about the risks such as infection, CSF leak, worsening of pain, paralysis, damage to the vessels of the abdomen and need for further surgery. Dictated by:   Tanya Nones. Jeral Fruit, M.D. Attending Physician:  Danella Penton DD:  09/12/01 TD:  09/12/01 Job: [id number]

## 2010-09-25 NOTE — Assessment & Plan Note (Signed)
A 30 year old female approximately nine months postpartum.  Last seen by me  on Ann [name] Ann [name], Ann [name].  She has had a history of left buttock pain responsive to  sacroiliac joint injection, but was unable to confirm initially positive  because of subsequent diagnosis of von Willebrand's disease.  I received a  letter from Dr. Cyndie [name] indicating that she can have surgery after usage  of DDAVP/desmopressin.  She has had oral surgery since that time and has  teeth pulled without incident after using this medication prior to  procedure.   Her main complaint at this time continues to be the left low back/buttock  pain.  She states that this pain comes and goes, but gets severe at times.  A couple of weeks ago, she had problems going up steps because of this.  In  fact, she states that she did not go up her steps because of pain.  She  states her pain averages 7-9/10.  This was made worse with walking, bending,  sitting, and therapy.  She denies any pain going down the legs.  She has no  bowel or bladder complaints.   She last worked in 2003.  She had onset of back pain and had a history of L4-  5 laminotomy performed by Dr. Jeral Fruit for a large disk herniation at L4-5 and  L5-S1 and foraminotomies at L4-5 and L5-S1 and diskectomies at those levels  in May of 2003.   CURRENT ACTIVITY LEVEL:  Taking care of her 70 month old.  Her mother states  that she sometimes needs assistance due to her pain level.  She is driving.  She shops.  She is planning to resume her last semester of college.  She is  pursuing a degree in psychology.   The patient's mother indicates that the patient could have had a job with  her husband's company, [employer], but they did not think she could pass  the company physical.   INTERVAL HISTORY:  Nothing new.  She has been off narcotics for the last  month or two.  The last medication I called in was Skelaxin.  She states  Skelaxin is not very helpful.  The Lidoderm is  marginally helpful.  She has  taken Ultram in the past, which has been somewhat helpful.   REVIEW OF SYSTEMS:  Positive for shortness of breath, reflux, heartburn,  excessive sweating, weakness, spasms, anxiety, depression, poor sleep,  agitation, and headaches.  The patient does note that she uses a counselor  for anxiety and depression.   PHYSICAL EXAMINATION:  Blood pressure 142/65, pulse 71, respirations 20, O2  saturation 99% on room air.   Mood and affect appropriate.  Her back has not tenderness to palpation along  the cervical, thoracic, or lumbar spine.  No pain along the PSIS.  No pain  around the hip or gluteus medius area.  She is able to bend forward, bend  backward, and to the side without much difficulty.  She has full range of  motion in bilateral hips, knees, and ankles.  She has normal sensation in  bilateral lower extremities and normal deep tendon reflexes in bilateral  lower extremities.  She is able to toe walk and heel walk.   Her Faber's test is positive in the left SI joint area when done on the  right or on the left side.   IMPRESSION:  Left sacroiliac joint arthropathy.  I think this is the cause  of her back pain.  In  fact, she does not have any radicular symptoms or any  pain referable to her problem with lumbar disk disease.   RECOMMENDATIONS:  1. She needs confirmatory SI joint block and in fact will try adding some     corticosteroid to see if we can get longer effect.  She will need to use     her DDAVP prior to.  If this is positive for long term, this can be     repeated every three to four months.  If it is only helpful short term,     would then recommend SI radiofrequency.  2. I have reiterated my recommendations for her to finish college and did     not feel that the disability application was going to serve her best in     the long run.   MEDICATIONS PRESCRIBED TODAY:  1. Ultram 50 mg, #90, one p.o. t.i.d.  2. Flexeril 5 mg one p.o.  t.i.d., #90.      Ann [name], M.D.   AEK/MedQ  D:  08/08/Ann [name] 16:15:16  T:  08/08/Ann [name] 21:53:51  Job #:  [id number]   cc:   Genene Churn. Ann [name], M.D.  [address] [facility]  [place]  [place] [address]  Fax: [phone]   Ann [name]

## 2010-09-25 NOTE — Procedures (Signed)
NAMEKIRSTEN, [name] NO.:  [id number]   MEDICAL RECORD NO.:  [id number]          PATIENT TYPE:  REC   LOCATION:  [facility]                          FACILITY:  [facility]   PHYSICIAN:  Erick Colace, M.D.DATE OF BIRTH:  [birth year]   DATE OF PROCEDURE:  03/02/2004  DATE OF DISCHARGE:                                 OPERATIVE REPORT   DATE OF BIRTH:  1980-06-30.   MEDICAL RECORD NUMBER:  [id number].   A 30 year old female approximately one year post partum. Returns for  sacroiliac joint injection. Has good response to prior injection, i.e.  almost two months' relief. Current pain level 8/10.   Informed consent was obtained after describing risks and benefits of the  procedure with the patient. The patient took her DDAVP at 9:30 this morning.   The patient placed prone on the fluoroscopy table, Betadine prep, sterile  drape. A 25-gauge 1-1/4-inch needle was used to anesthetize skin and subcu  tissues with 4 cc of 1% lidocaine. Then a 22-gauge 3-1/2-inch spinal needle  was used under fluoroscopic guidance to advance into the sacroiliac joint  with an ipsilateral oblique 10 degree angle. Then, lateral view demonstrated  intra-articular placement. Contrast injection showed no intravascular  uptake. Then a solution containing 0.75 cc of Kenalog 40 mg/cc plus 0.75 cc  of 2% methylparaben-free lidocaine were injected. The patient tolerated the  procedure well. Preinjection pain level 8/10; postinjection pain level 0/10.  She should return in 2 months with possible reinjection at that time.       AEK/MEDQ  D:  03/02/2004 13:11:01  T:  03/02/2004 15:32:17  Job:  [id number]

## 2010-10-02 ENCOUNTER — Other Ambulatory Visit (INDEPENDENT_AMBULATORY_CARE_PROVIDER_SITE_OTHER): Payer: 59

## 2010-10-02 DIAGNOSIS — N970 Female infertility associated with anovulation: Secondary | ICD-10-CM

## 2010-10-02 DIAGNOSIS — N979 Female infertility, unspecified: Secondary | ICD-10-CM

## 2010-10-08 ENCOUNTER — Other Ambulatory Visit: Payer: Self-pay | Admitting: Obstetrics and Gynecology

## 2010-10-08 DIAGNOSIS — N979 Female infertility, unspecified: Secondary | ICD-10-CM

## 2010-10-12 ENCOUNTER — Ambulatory Visit (HOSPITAL_COMMUNITY)
Admission: RE | Admit: 2010-10-12 | Discharge: 2010-10-12 | Disposition: A | Discharge status: Home / Self Care | Payer: 59 | Source: Ambulatory Visit | Attending: Obstetrics and Gynecology | Admitting: Obstetrics and Gynecology

## 2010-10-12 DIAGNOSIS — N979 Female infertility, unspecified: Secondary | ICD-10-CM

## 2010-10-26 ENCOUNTER — Other Ambulatory Visit (INDEPENDENT_AMBULATORY_CARE_PROVIDER_SITE_OTHER): Payer: 59 | Admitting: Obstetrics and Gynecology

## 2010-10-26 DIAGNOSIS — N979 Female infertility, unspecified: Secondary | ICD-10-CM

## 2010-11-23 ENCOUNTER — Other Ambulatory Visit (INDEPENDENT_AMBULATORY_CARE_PROVIDER_SITE_OTHER): Payer: 59

## 2010-11-23 DIAGNOSIS — N979 Female infertility, unspecified: Secondary | ICD-10-CM

## 2010-12-07 ENCOUNTER — Telehealth: Payer: Self-pay | Admitting: *Deleted

## 2010-12-07 DIAGNOSIS — N979 Female infertility, unspecified: Secondary | ICD-10-CM

## 2010-12-07 MED ORDER — LETROZOLE 2.5 MG PO TABS: 2.5000 mg | ORAL_TABLET | Freq: Every day | ORAL | Status: AC

## 2010-12-07 NOTE — Telephone Encounter (Signed)
Patient called started her period today after taking Femara 5mg  D5-9. Prog showed that she ovulated so therefore she will repeat Femara 5mg  D5-9 and follow up with or without a period. Rx sent to pharmacy.

## 2011-01-12 LAB — OB RESULTS CONSOLE HEPATITIS B SURFACE ANTIGEN: Hepatitis B Surface Ag: NEGATIVE

## 2011-01-13 ENCOUNTER — Telehealth: Payer: Self-pay | Admitting: *Deleted

## 2011-01-13 DIAGNOSIS — N912 Amenorrhea, unspecified: Secondary | ICD-10-CM

## 2011-01-13 NOTE — Telephone Encounter (Signed)
PT CALLED NO PERIOD STARTED DID A HOME PREG TEST, POSITIVE. ADVISED TO COME HERE FOR A QUANT. KW

## 2011-01-14 ENCOUNTER — Other Ambulatory Visit (INDEPENDENT_AMBULATORY_CARE_PROVIDER_SITE_OTHER): Payer: 59 | Admitting: *Deleted

## 2011-01-14 DIAGNOSIS — N912 Amenorrhea, unspecified: Secondary | ICD-10-CM

## 2011-01-15 ENCOUNTER — Ambulatory Visit: Payer: 59 | Admitting: Obstetrics and Gynecology

## 2011-01-21 ENCOUNTER — Other Ambulatory Visit: Payer: Self-pay | Admitting: Obstetrics and Gynecology

## 2011-01-21 ENCOUNTER — Ambulatory Visit
Admission: RE | Admit: 2011-01-21 | Discharge: 2011-01-21 | Disposition: A | Discharge status: Home / Self Care | Payer: 59 | Source: Ambulatory Visit | Attending: Obstetrics and Gynecology | Admitting: Obstetrics and Gynecology

## 2011-01-21 ENCOUNTER — Inpatient Hospital Stay: Admission: RE | Admit: 2011-01-21 | Payer: 59 | Source: Ambulatory Visit

## 2011-01-21 ENCOUNTER — Ambulatory Visit (INDEPENDENT_AMBULATORY_CARE_PROVIDER_SITE_OTHER): Payer: 59 | Admitting: Obstetrics and Gynecology

## 2011-01-21 ENCOUNTER — Ambulatory Visit: Payer: 59 | Admitting: Obstetrics and Gynecology

## 2011-01-21 DIAGNOSIS — N912 Amenorrhea, unspecified: Secondary | ICD-10-CM

## 2011-01-21 DIAGNOSIS — O9989 Other specified diseases and conditions complicating pregnancy, childbirth and the puerperium: Secondary | ICD-10-CM

## 2011-01-21 LAB — US OB TRANSVAGINAL

## 2011-01-21 NOTE — Progress Notes (Signed)
The patient came to see me today. She conceived  on the second cycle of Femara. On ultrasound today she has a 6 week 4 day pregnancy which is consistent with her dates. Fetal heart rate activity was normal. It is a singleton pregnancy with a gestational sac seen with fetal pole. A normal yolk sac was seen. The patient has a corpus luteal cyst on her right ovary. Her left her ovary is normal. Her cul-de-sac is free of fluid. She was reassured. She will continue her prenatal vitamins. We discussed physcians she could see  for obstetrical care. She was given a copy of her ultrasound report. We discussed noninvasive genetic testing.

## 2011-01-25 ENCOUNTER — Inpatient Hospital Stay (HOSPITAL_COMMUNITY)
Admission: AD | Admit: 2011-01-25 | Discharge: 2011-01-25 | Disposition: A | Discharge status: Home / Self Care | Payer: 59 | Source: Ambulatory Visit | Attending: Obstetrics & Gynecology | Admitting: Obstetrics & Gynecology

## 2011-01-25 ENCOUNTER — Encounter (HOSPITAL_COMMUNITY): Payer: Self-pay | Admitting: *Deleted

## 2011-01-25 DIAGNOSIS — R109 Unspecified abdominal pain: Secondary | ICD-10-CM | POA: Insufficient documentation

## 2011-01-25 DIAGNOSIS — O98819 Other maternal infectious and parasitic diseases complicating pregnancy, unspecified trimester: Secondary | ICD-10-CM | POA: Insufficient documentation

## 2011-01-25 DIAGNOSIS — A599 Trichomoniasis, unspecified: Secondary | ICD-10-CM

## 2011-01-25 DIAGNOSIS — A5901 Trichomonal vulvovaginitis: Secondary | ICD-10-CM

## 2011-01-25 LAB — URINALYSIS, ROUTINE W REFLEX MICROSCOPIC
Bilirubin Urine: NEGATIVE
Glucose, UA: NEGATIVE mg/dL
Ketones, ur: NEGATIVE mg/dL
Protein, ur: NEGATIVE mg/dL

## 2011-01-25 LAB — URINE MICROSCOPIC-ADD ON

## 2011-01-25 MED ORDER — METRONIDAZOLE 500 MG PO TABS: 2000.0000 mg | ORAL_TABLET | Freq: Once | ORAL | Status: AC

## 2011-01-25 NOTE — Progress Notes (Signed)
Pt reports she is 7 weeks preg and has had spotting for 3 days, lower abd pain, and burning with urination. Also reports a yellow discharge today. LMP 12/07/2010

## 2011-01-25 NOTE — ED Provider Notes (Signed)
Chief Complaint:  Abdominal Pain spotting  Ann [name] is  30 y.o. G1P0 at [redacted]w[redacted]d presents complaining of Abdominal Pain and spotting .  Abd pain is mild and crampy in nature  Obstetrical/Gynecological History: Menstrual History: OB History    Grav Para Term Preterm Abortions TAB SAB Ect Mult Living   1                Past Medical History: No past medical history on file.  Past Surgical History: No past surgical history on file.  Family History: No family history on file.  Social History: History  Substance Use Topics  . Smoking status: Not on file  . Smokeless tobacco: Not on file  . Alcohol Use: Not on file    Allergies:  Allergies  Allergen Reactions  . Codeine   . Darvocet (Propoxyphene N-Acetaminophen)   . Sulfa Antibiotics     Meds:  Prescriptions prior to admission  Medication Sig Dispense Refill  . PRENATAL VITAMINS PO Take by mouth.          Review of Systems - Please refer to the aforementioned patients' reports.   Negative except as stated above  Physical Exam  Blood pressure 138/84, pulse 78, temperature 99.2 F (37.3 C), temperature source Oral, resp. rate 18, height 5\' 3"  (1.6 m), weight 264 lb (119.75 kg), last menstrual period 12/07/2010. GENERAL: Well-developed, well-nourished female in no acute distress.  LUNGS: Clear to auscultation bilaterally.  HEART: Regular rate and rhythm. ABDOMEN: Soft, nontender, nondistended, gravid.  EXTREMITIES: Nontender, no edema, 2+ distal pulses. Cervical Exam: Dilatation 0cm   Effacement 0%   Station n/a   Yellow frothy discharge noted. With a friable cx   Bedside u/s shows ~ 7 week IUP with + FCA  Labs: Recent Results (from the past 24 hour(s))  URINALYSIS, ROUTINE W REFLEX MICROSCOPIC   Collection Time   01/25/11  7:20 PM      Component Value Range   Color, Urine YELLOW  YELLOW    Appearance CLEAR  CLEAR    Specific Gravity, Urine 1.010  1.005 - 1.030    pH 6.0  5.0 - 8.0    Glucose, UA  NEGATIVE  NEGATIVE (mg/dL)   Hgb urine dipstick TRACE (*) NEGATIVE    Bilirubin Urine NEGATIVE  NEGATIVE    Ketones, ur NEGATIVE  NEGATIVE (mg/dL)   Protein, ur NEGATIVE  NEGATIVE (mg/dL)   Urobilinogen, UA 0.2  0.0 - 1.0 (mg/dL)   Nitrite NEGATIVE  NEGATIVE    Leukocytes, UA MODERATE (*) NEGATIVE   URINE MICROSCOPIC-ADD ON   Collection Time   01/25/11  7:20 PM      Component Value Range   Squamous Epithelial / LPF RARE  RARE    WBC, UA 3-6  <3 (WBC/hpf)   RBC / HPF 0-2  <3 (RBC/hpf)   Urine-Other MUCOUS PRESENT    wet prep shows + WBC's, + trich   Imaging Studies:  No results found.  Assessment: Ann [name] is  30 y.o. G1P0 @ 7.1 weeks, spotting 2/2 trichamonas.  Plan: Treat pt, instruct partner to get treated  CRESENZO-DISHMAN,Adrian Specht 9/17/20127:55 PM

## 2011-01-26 LAB — GC/CHLAMYDIA PROBE AMP, GENITAL
Chlamydia, DNA Probe: NEGATIVE
GC Probe Amp, Genital: NEGATIVE

## 2011-01-27 LAB — CBC
HCT: 26.2 — ABNORMAL LOW
HCT: 34.6 — ABNORMAL LOW
Hemoglobin: 9 — ABNORMAL LOW
MCHC: 34.3
MCV: 91.5
MCV: 91.8
MCV: 91.9
MCV: 93.6
Platelets: 255
Platelets: 263
Platelets: 412 — ABNORMAL HIGH
RBC: 2.85 — ABNORMAL LOW
RBC: 2.99 — ABNORMAL LOW
RDW: 14.1
WBC: 11.4 — ABNORMAL HIGH
WBC: 12.1 — ABNORMAL HIGH
WBC: 13.1 — ABNORMAL HIGH
WBC: 15.7 — ABNORMAL HIGH

## 2011-01-27 LAB — DIFFERENTIAL
Basophils Absolute: 0.1
Lymphocytes Relative: 26
Monocytes Absolute: 0.3
Neutro Abs: 9.2 — ABNORMAL HIGH
Neutrophils Relative %: 71

## 2011-01-27 LAB — COMPREHENSIVE METABOLIC PANEL
Albumin: 4.1
BUN: 9
Chloride: 106
Creatinine, Ser: 0.65
GFR calc non Af Amer: >60
Glucose, Bld: 84
Total Bilirubin: 1.6 — ABNORMAL HIGH

## 2011-01-27 LAB — ELECTROLYTE PANEL
CO2: 27
Chloride: 105
Potassium: 4.2

## 2011-01-27 LAB — TYPE AND SCREEN
ABO/RH(D): O POS
Antibody Screen: NEGATIVE

## 2011-01-27 LAB — BASIC METABOLIC PANEL
BUN: 4 — ABNORMAL LOW
BUN: 6
CO2: 27
Chloride: 101
Chloride: 97
Creatinine, Ser: 0.62
Creatinine, Ser: 0.64
Creatinine, Ser: 0.68
GFR calc Af Amer: >60
GFR calc Af Amer: >60
GFR calc non Af Amer: >60
GFR calc non Af Amer: >60
Glucose, Bld: 135 — ABNORMAL HIGH
Potassium: 3.7
Potassium: 4

## 2011-01-27 LAB — FACTOR 8 ASSAY: Coagulation Factor VIII: 164 — ABNORMAL HIGH

## 2011-01-27 LAB — SEDIMENTATION RATE: Sed Rate: 57 — ABNORMAL HIGH

## 2011-01-28 LAB — PROTIME-INR
INR: 1.1
Prothrombin Time: 14

## 2011-01-28 LAB — CBC
HCT: 32.4 — ABNORMAL LOW
Hemoglobin: 10.7 — ABNORMAL LOW
MCHC: 33
MCV: 94.6
Platelets: 465 — ABNORMAL HIGH
RBC: 3.43 — ABNORMAL LOW
RDW: 14.6
WBC: 12.6 — ABNORMAL HIGH

## 2011-01-28 LAB — CULTURE, BLOOD (ROUTINE X 2)
Culture: NO GROWTH
Culture: NO GROWTH

## 2011-01-28 LAB — BASIC METABOLIC PANEL
BUN: 9
CO2: 26
Calcium: 9.4
Chloride: 105
Creatinine, Ser: 0.66
GFR calc Af Amer: >60
GFR calc non Af Amer: >60
Glucose, Bld: 111 — ABNORMAL HIGH
Potassium: 3.3 — ABNORMAL LOW
Sodium: 137

## 2011-01-28 LAB — DIFFERENTIAL
Basophils Absolute: 0.1
Basophils Relative: 1
Eosinophils Absolute: 0
Eosinophils Relative: 0
Lymphocytes Relative: 35
Lymphs Abs: 4.4 — ABNORMAL HIGH
Monocytes Absolute: 0.4
Monocytes Relative: 4
Neutro Abs: 7.7
Neutrophils Relative %: 61

## 2011-01-28 LAB — TYPE AND SCREEN
ABO/RH(D): O POS
Antibody Screen: NEGATIVE

## 2011-01-28 LAB — ANAEROBIC CULTURE

## 2011-01-28 LAB — WOUND CULTURE: Culture: NO GROWTH

## 2011-01-28 LAB — SEDIMENTATION RATE: Sed Rate: 70 — ABNORMAL HIGH

## 2011-01-28 LAB — APTT: aPTT: 38 — ABNORMAL HIGH

## 2011-02-01 LAB — CBC
HCT: 38
HCT: 35.5 — ABNORMAL LOW
HCT: 37
Hemoglobin: 10.4 — ABNORMAL LOW
Hemoglobin: 12
MCHC: 32.7
MCHC: 33.5
MCV: 91.1
MCV: 91.1
MCV: 91.6
Platelets: 423 — ABNORMAL HIGH
Platelets: 293
RBC: 3.4 — ABNORMAL LOW
RBC: 4.04
RDW: 13.9
WBC: 11.4 — ABNORMAL HIGH
WBC: 10.2
WBC: 7.3

## 2011-02-01 LAB — DIFFERENTIAL
Basophils Absolute: 0
Basophils Relative: 0
Basophils Relative: 1
Eosinophils Absolute: 0
Eosinophils Absolute: 0.1
Eosinophils Relative: 0
Eosinophils Relative: 1
Eosinophils Relative: 1
Lymphocytes Relative: 36
Lymphs Abs: 1.9
Monocytes Absolute: 0.5
Monocytes Absolute: 0.5
Monocytes Relative: 6
Neutrophils Relative %: 79 — ABNORMAL HIGH

## 2011-02-01 LAB — COMPREHENSIVE METABOLIC PANEL
AST: 18
AST: 18
Albumin: 3.2 — ABNORMAL LOW
Alkaline Phosphatase: 64
Alkaline Phosphatase: 69
BUN: 5 — ABNORMAL LOW
CO2: 28
Chloride: 105
Chloride: 107
Creatinine, Ser: 0.78
GFR calc Af Amer: >60
GFR calc Af Amer: >60
GFR calc non Af Amer: >60
Potassium: 3.5
Potassium: 3.9
Sodium: 140
Total Bilirubin: 0.5
Total Bilirubin: 0.8

## 2011-02-01 LAB — URINALYSIS, ROUTINE W REFLEX MICROSCOPIC
Glucose, UA: NEGATIVE
Ketones, ur: >80 — AB
Ketones, ur: NEGATIVE
Nitrite: NEGATIVE
Nitrite: NEGATIVE
Protein, ur: 100 — AB
Specific Gravity, Urine: 1.019
Specific Gravity, Urine: >1.03 — ABNORMAL HIGH
Urobilinogen, UA: 0.2
pH: 6

## 2011-02-01 LAB — I-STAT 8, (EC8 V) (CONVERTED LAB)
Acid-Base Excess: 3 — ABNORMAL HIGH
BUN: 6
BUN: <3 — ABNORMAL LOW
Bicarbonate: 26.7 — ABNORMAL HIGH
Bicarbonate: 26.3 — ABNORMAL HIGH
Chloride: 105
Glucose, Bld: 102 — ABNORMAL HIGH
Glucose, Bld: 82
HCT: 43
Operator id: 151321
TCO2: 28
TCO2: 28
pCO2, Ven: 44 — ABNORMAL LOW
pCO2, Ven: 51.4 — ABNORMAL HIGH
pCO2, Ven: 56.3 — ABNORMAL HIGH
pH, Ven: 7.277
pH, Ven: 7.362 — ABNORMAL HIGH

## 2011-02-01 LAB — URINE MICROSCOPIC-ADD ON

## 2011-02-01 LAB — POCT I-STAT CREATININE
Creatinine, Ser: 0.8
Creatinine, Ser: 0.8
Operator id: 294511

## 2011-02-01 LAB — HEPATIC FUNCTION PANEL
ALT: 18
AST: 18
Alkaline Phosphatase: 70
Total Protein: 8.2

## 2011-02-01 LAB — LIPASE, BLOOD: Lipase: 11

## 2011-02-02 LAB — DIFFERENTIAL
Basophils Absolute: 0
Basophils Relative: 0
Eosinophils Absolute: 0.2
Eosinophils Relative: 2
Lymphocytes Relative: 37

## 2011-02-02 LAB — SEDIMENTATION RATE: Sed Rate: 25 — ABNORMAL HIGH

## 2011-02-02 LAB — CBC
HCT: 35.3 — ABNORMAL LOW
MCV: 90.8
Platelets: 275
RDW: 14.5

## 2011-02-02 LAB — ANAEROBIC CULTURE

## 2011-02-02 LAB — WOUND CULTURE: Gram Stain: NONE SEEN

## 2011-02-04 NOTE — ED Provider Notes (Signed)
IUP noted on [place] 01/21/11 at Dr. Chase Caller office. Agree with above note.  Ann Parlin H. 02/04/2011 10:14 AM

## 2011-02-08 LAB — CBC
MCV: 95.8
Platelets: 321
RBC: 4.13
WBC: 8.2

## 2011-02-08 LAB — WET PREP, GENITAL
Trich, Wet Prep: NONE SEEN
WBC, Wet Prep HPF POC: NONE SEEN

## 2011-02-08 LAB — GC/CHLAMYDIA PROBE AMP, GENITAL
Chlamydia, DNA Probe: NEGATIVE
GC Probe Amp, Genital: NEGATIVE

## 2011-02-12 LAB — CBC
HCT: 31 — ABNORMAL LOW
MCHC: 33.5
MCV: 92.7
RBC: 3.35 — ABNORMAL LOW
WBC: 10.2

## 2011-02-12 LAB — CLOSTRIDIUM DIFFICILE EIA

## 2011-02-12 LAB — POCT I-STAT CREATININE: Operator id: 261381

## 2011-02-12 LAB — URINALYSIS, ROUTINE W REFLEX MICROSCOPIC
Glucose, UA: NEGATIVE
Nitrite: NEGATIVE
Specific Gravity, Urine: 1.029
pH: 6

## 2011-02-12 LAB — STOOL CULTURE

## 2011-02-12 LAB — BASIC METABOLIC PANEL
CO2: 28
Chloride: 106
Creatinine, Ser: 0.9
GFR calc Af Amer: >60
Potassium: 4.1

## 2011-02-12 LAB — FECAL LACTOFERRIN, QUANT

## 2011-02-12 LAB — I-STAT 8, (EC8 V) (CONVERTED LAB)
Acid-Base Excess: 3 — ABNORMAL HIGH
Chloride: 109
HCT: 42
Operator id: 261381
Potassium: 4.1
TCO2: 28

## 2011-02-15 LAB — PREPARE CRYOPRECIPITATE

## 2011-02-15 LAB — CBC
HCT: 35 — ABNORMAL LOW
MCHC: 34.5
MCV: 91.9
Platelets: 359
RDW: 13.9

## 2011-02-15 LAB — BASIC METABOLIC PANEL
BUN: 9
CO2: 26
Chloride: 107
Creatinine, Ser: 0.8
Glucose, Bld: 95
Potassium: 4.3

## 2011-02-15 LAB — CROSSMATCH: ABO/RH(D): O POS

## 2011-02-15 LAB — APTT: aPTT: 29

## 2011-02-15 LAB — ABO/RH: ABO/RH(D): O POS

## 2011-02-19 LAB — CBC
HCT: 33.5 — ABNORMAL LOW
Hemoglobin: 11.4 — ABNORMAL LOW
MCHC: 34
MCV: 91.5
Platelets: 353
RBC: 3.67 — ABNORMAL LOW
RDW: 13.9
WBC: 10.9 — ABNORMAL HIGH

## 2011-02-19 LAB — DIFFERENTIAL
Basophils Absolute: 0
Basophils Relative: 0
Eosinophils Absolute: 0.2
Eosinophils Relative: 2
Lymphocytes Relative: 31
Lymphs Abs: 3.4 — ABNORMAL HIGH
Monocytes Absolute: 0.5
Monocytes Relative: 5
Neutro Abs: 6.7
Neutrophils Relative %: 62

## 2011-02-22 LAB — URINALYSIS, ROUTINE W REFLEX MICROSCOPIC
Bilirubin Urine: NEGATIVE
Glucose, UA: NEGATIVE
Hgb urine dipstick: NEGATIVE
Protein, ur: NEGATIVE

## 2011-02-22 LAB — POCT PREGNANCY, URINE: Operator id: 189501

## 2011-04-14 ENCOUNTER — Other Ambulatory Visit: Payer: Self-pay | Admitting: Obstetrics and Gynecology

## 2011-04-14 ENCOUNTER — Encounter (HOSPITAL_COMMUNITY): Payer: Self-pay | Admitting: *Deleted

## 2011-04-14 ENCOUNTER — Inpatient Hospital Stay (HOSPITAL_COMMUNITY)
Admission: AD | Admit: 2011-04-14 | Discharge: 2011-04-14 | Disposition: A | Discharge status: Home / Self Care | Payer: 59 | Source: Ambulatory Visit | Attending: Obstetrics and Gynecology | Admitting: Obstetrics and Gynecology

## 2011-04-14 DIAGNOSIS — Z87448 Personal history of other diseases of urinary system: Secondary | ICD-10-CM

## 2011-04-14 DIAGNOSIS — N979 Female infertility, unspecified: Secondary | ICD-10-CM | POA: Diagnosis not present

## 2011-04-14 DIAGNOSIS — A499 Bacterial infection, unspecified: Secondary | ICD-10-CM | POA: Insufficient documentation

## 2011-04-14 DIAGNOSIS — D649 Anemia, unspecified: Secondary | ICD-10-CM

## 2011-04-14 DIAGNOSIS — D680 Von Willebrand's disease: Secondary | ICD-10-CM | POA: Diagnosis present

## 2011-04-14 DIAGNOSIS — R109 Unspecified abdominal pain: Secondary | ICD-10-CM | POA: Insufficient documentation

## 2011-04-14 DIAGNOSIS — Z8659 Personal history of other mental and behavioral disorders: Secondary | ICD-10-CM

## 2011-04-14 DIAGNOSIS — B9689 Other specified bacterial agents as the cause of diseases classified elsewhere: Secondary | ICD-10-CM | POA: Insufficient documentation

## 2011-04-14 DIAGNOSIS — G43909 Migraine, unspecified, not intractable, without status migrainosus: Secondary | ICD-10-CM

## 2011-04-14 DIAGNOSIS — N760 Acute vaginitis: Secondary | ICD-10-CM | POA: Insufficient documentation

## 2011-04-14 DIAGNOSIS — O2390 Unspecified genitourinary tract infection in pregnancy, unspecified trimester: Secondary | ICD-10-CM | POA: Insufficient documentation

## 2011-04-14 DIAGNOSIS — Z87898 Personal history of other specified conditions: Secondary | ICD-10-CM

## 2011-04-14 DIAGNOSIS — Z98891 History of uterine scar from previous surgery: Secondary | ICD-10-CM

## 2011-04-14 DIAGNOSIS — B009 Herpesviral infection, unspecified: Secondary | ICD-10-CM | POA: Diagnosis not present

## 2011-04-14 DIAGNOSIS — Z3490 Encounter for supervision of normal pregnancy, unspecified, unspecified trimester: Secondary | ICD-10-CM

## 2011-04-14 DIAGNOSIS — Z8619 Personal history of other infectious and parasitic diseases: Secondary | ICD-10-CM

## 2011-04-14 DIAGNOSIS — J45909 Unspecified asthma, uncomplicated: Secondary | ICD-10-CM

## 2011-04-14 DIAGNOSIS — Z9889 Other specified postprocedural states: Secondary | ICD-10-CM

## 2011-04-14 DIAGNOSIS — Z87891 Personal history of nicotine dependence: Secondary | ICD-10-CM

## 2011-04-14 HISTORY — DX: Anemia, unspecified: D64.9

## 2011-04-14 HISTORY — DX: Depression, unspecified: F32.A

## 2011-04-14 HISTORY — DX: Von Willebrand disease, unspecified: D68.00

## 2011-04-14 HISTORY — DX: Von Willebrand's disease: D68.0

## 2011-04-14 HISTORY — DX: Major depressive disorder, single episode, unspecified: F32.9

## 2011-04-14 LAB — URINALYSIS, ROUTINE W REFLEX MICROSCOPIC
Glucose, UA: NEGATIVE mg/dL
Hgb urine dipstick: NEGATIVE
Leukocytes, UA: NEGATIVE
pH: 6.5 (ref 5.0–8.0)

## 2011-04-14 LAB — WET PREP, GENITAL: Yeast Wet Prep HPF POC: NONE SEEN

## 2011-04-14 MED ORDER — IBUPROFEN 800 MG PO TABS
800.0000 mg | ORAL_TABLET | Freq: Once | ORAL | Status: AC
  Administered 2011-04-14: 800 mg via ORAL
  Filled 2011-04-14: qty 1

## 2011-04-14 MED ORDER — METRONIDAZOLE 500 MG PO TABS: 500.0000 mg | ORAL_TABLET | Freq: Two times a day (BID) | ORAL | Status: AC

## 2011-04-14 NOTE — ED Provider Notes (Signed)
History     Chief Complaint  Patient presents with  . Abdominal Pain   HPI Comments: Pt is a G3P1011 at [redacted]w[redacted]d that arrives with CC increased discharge leaking several times today. Also c/o intermittent cramping. Reports drinking at least 10 glasses of water. Denies any VB, occ feels FM. Was dx with URI last week, just finished tx today. Denies dysuria.  Pregnancy significant for: 1. Hx c/s desire repeat 2. Von willebrand 3. Hx trich in 1st trimester   Abdominal Pain Pertinent negatives include no dysuria or frequency.      Past Medical History  Diagnosis Date  . Trichomonas     Past Surgical History  Procedure Date  . No past surgeries     No family history on file.  History  Substance Use Topics  . Smoking status: Never Smoker   . Smokeless tobacco: Not on file  . Alcohol Use: No    Allergies:  Allergies  Allergen Reactions  . Codeine Nausea And Vomiting  . Darvocet (Propoxyphene N-Acetaminophen) Nausea And Vomiting  . Sulfa Antibiotics Rash    Prescriptions prior to admission  Medication Sig Dispense Refill  . acetaminophen (TYLENOL) 325 MG tablet Take 650 mg by mouth every 6 (six) hours as needed. For headache.       . albuterol-ipratropium (COMBIVENT) 18-103 MCG/ACT inhaler Inhale 2 puffs into the lungs every 6 (six) hours as needed. Shortness of breathe       . cephALEXin (KEFLEX) 125 MG/5ML suspension Take by mouth 4 (four) times daily. infection       . PRENATAL VITAMINS PO Take by mouth.        Marland Kitchen amoxicillin (AMOXIL) 500 MG tablet Take 500 mg by mouth 4 (four) times daily.          Review of Systems  Gastrointestinal: Positive for abdominal pain.       Cramping since sunday  Genitourinary: Negative for dysuria, urgency and frequency.       Increased clear discharge without odor  All other systems reviewed and are negative.   Physical Exam   Blood pressure 136/82, pulse 82, temperature 99.4 F (37.4 C), resp. rate 18, height 5\' 3"  (1.6 m),  weight 121.11 kg (267 lb), last menstrual period 12/07/2010, SpO2 99.00%.  Physical Exam  Nursing note and vitals reviewed. Constitutional: She is oriented to person, place, and time. She appears well-developed and well-nourished.  Neck: Normal range of motion.  Cardiovascular: Normal rate.   Respiratory: Effort normal.  GI: Soft. She exhibits no distension. There is no tenderness. There is no rebound.  Genitourinary: Vagina normal.       Vault mostly clear, some white discharge, no pooling Wet prep GC/CT cx cx - cl/long/firm, neg cmt  Musculoskeletal: Normal range of motion.  Neurological: She is alert and oriented to person, place, and time.  Skin: Skin is warm and dry.  Psychiatric: She has a normal mood and affect. Her behavior is normal. Judgment and thought content normal.    +FHT's Results for orders placed during the hospital encounter of 04/14/11 (from the past 24 hour(s))  URINALYSIS, ROUTINE W REFLEX MICROSCOPIC     Status: Normal   Collection Time   04/14/11  8:40 PM      Component Value Range   Color, Urine YELLOW  YELLOW    APPearance CLEAR  CLEAR    Specific Gravity, Urine 1.015  1.005 - 1.030    pH 6.5  5.0 - 8.0    Glucose, UA  NEGATIVE  NEGATIVE (mg/dL)   Hgb urine dipstick NEGATIVE  NEGATIVE    Bilirubin Urine NEGATIVE  NEGATIVE    Ketones, ur NEGATIVE  NEGATIVE (mg/dL)   Protein, ur NEGATIVE  NEGATIVE (mg/dL)   Urobilinogen, UA 0.2  0.0 - 1.0 (mg/dL)   Nitrite NEGATIVE  NEGATIVE    Leukocytes, UA NEGATIVE  NEGATIVE   WET PREP, GENITAL     Status: Abnormal   Collection Time   04/14/11  9:30 PM      Component Value Range   Yeast, Wet Prep NONE SEEN  NONE SEEN    Trich, Wet Prep NONE SEEN  NONE SEEN    Clue Cells, Wet Prep MODERATE (*) NONE SEEN    WBC, Wet Prep HPF POC RARE (*) NONE SEEN      MAU Course  Procedures    Assessment and Plan  18wk IUP +clue cells BV  D/c home with flagyl 500mg  bid x7days called to [facility] on [address] Motrin 600mg   PO q6h prn F/u as scheduled in office, has appoint nxt week, Tuesday   Declan Adamson M 04/14/2011, 9:57 PM

## 2011-04-14 NOTE — Progress Notes (Signed)
Pt reports when she goes to the restroom her underwear is wet, is not wearing a pad. G3 P1. abd pain off/on x 3 days. Denies dysuria. Denies bleeding.

## 2011-04-15 LAB — GC/CHLAMYDIA PROBE AMP, GENITAL: GC Probe Amp, Genital: NEGATIVE

## 2011-04-19 ENCOUNTER — Inpatient Hospital Stay (HOSPITAL_COMMUNITY)
Admission: AD | Admit: 2011-04-19 | Discharge: 2011-04-20 | Disposition: A | Discharge status: Home / Self Care | Payer: 59 | Source: Ambulatory Visit | Attending: Obstetrics and Gynecology | Admitting: Obstetrics and Gynecology

## 2011-04-19 DIAGNOSIS — N760 Acute vaginitis: Secondary | ICD-10-CM | POA: Insufficient documentation

## 2011-04-19 DIAGNOSIS — R509 Fever, unspecified: Secondary | ICD-10-CM

## 2011-04-19 DIAGNOSIS — O2390 Unspecified genitourinary tract infection in pregnancy, unspecified trimester: Secondary | ICD-10-CM | POA: Insufficient documentation

## 2011-04-19 DIAGNOSIS — B9689 Other specified bacterial agents as the cause of diseases classified elsewhere: Secondary | ICD-10-CM | POA: Insufficient documentation

## 2011-04-19 DIAGNOSIS — O99891 Other specified diseases and conditions complicating pregnancy: Secondary | ICD-10-CM | POA: Insufficient documentation

## 2011-04-19 DIAGNOSIS — J069 Acute upper respiratory infection, unspecified: Secondary | ICD-10-CM | POA: Insufficient documentation

## 2011-04-19 DIAGNOSIS — B349 Viral infection, unspecified: Secondary | ICD-10-CM

## 2011-04-19 DIAGNOSIS — A499 Bacterial infection, unspecified: Secondary | ICD-10-CM | POA: Insufficient documentation

## 2011-04-19 LAB — CBC
MCH: 30.1 pg (ref 26.0–34.0)
MCHC: 32.8 g/dL (ref 30.0–36.0)
MCV: 91.8 fL (ref 78.0–100.0)
Platelets: 247 10*3/uL (ref 150–400)
RDW: 13.3 % (ref 11.5–15.5)

## 2011-04-19 LAB — DIFFERENTIAL
Basophils Absolute: 0 10*3/uL (ref 0.0–0.1)
Basophils Relative: 0 % (ref 0–1)
Eosinophils Absolute: 0 10*3/uL (ref 0.0–0.7)
Eosinophils Relative: 0 % (ref 0–5)

## 2011-04-19 MED ORDER — LACTATED RINGERS IV BOLUS (SEPSIS)
1000.0000 mL | Freq: Once | INTRAVENOUS | Status: AC
  Administered 2011-04-20: 1000 mL via INTRAVENOUS

## 2011-04-20 ENCOUNTER — Other Ambulatory Visit: Payer: Self-pay | Admitting: Obstetrics and Gynecology

## 2011-04-20 ENCOUNTER — Encounter (HOSPITAL_COMMUNITY): Payer: Self-pay | Admitting: *Deleted

## 2011-04-20 LAB — URINALYSIS, ROUTINE W REFLEX MICROSCOPIC
Glucose, UA: NEGATIVE mg/dL
Ketones, ur: 40 mg/dL — AB
Leukocytes, UA: NEGATIVE
Nitrite: NEGATIVE
Protein, ur: NEGATIVE mg/dL
Urobilinogen, UA: 0.2 mg/dL (ref 0.0–1.0)

## 2011-04-20 MED ORDER — OSELTAMIVIR PHOSPHATE 75 MG PO CAPS: 75.0000 mg | ORAL_CAPSULE | Freq: Two times a day (BID) | ORAL | Status: AC

## 2011-04-20 NOTE — Progress Notes (Signed)
H. Steelman, CNM at bedside.  Assessment done and poc discussed with pt.  

## 2011-04-20 NOTE — Progress Notes (Signed)
Pt presents to mau with c/o fever, body aches, congestion. Took temp at home and 101.5 after motrin.

## 2011-04-20 NOTE — ED Provider Notes (Signed)
History   Ann [name] is an obese black female who presents at 19.1 weeks for eval of fever.  Called to report fever of 102.6 around 2000, and had taken 2 regular strength Tylenol around 1930.  Pt also reported body aches, cough, and congestion for about 24 hrs, and daughter sick with same symptoms as well (32y.o.).  Pt did not receive flu shot this year.  Reporting feeling fine on Saturday (2 days ago).  Seen in MAU 04/14/11 for d/c, and diagnosed w/ BV and on Flagyl.  When pt called originally, instructed her to take Ibuprofen 800mg  at that time, and call back around 2200 to report temperature.  Also called in Tamiflu 75mg  po bid x5 d to [facility] on [address] and pt did have that picked up and took 1st dose before coming to MAU.  Pt called back at 2200, and temp still 101.2, and instructed her to come in for further eval.  Denies any UTI or PIH s/s.  Does report n/v and decreased appetite, but declined antinausea Rx. No diarrhea.  No VB, LOF or abdominal pain/ ctxs.  Pt has anatomy scan at office tomorrow.  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  Pt is planning repeat c/s for delivery.  Chief Complaint  Patient presents with  . Fever   HPI  OB History    Grav Para Term Preterm Abortions TAB SAB Ect Mult Living   3 1 1  1 1    1       Past Medical History  Diagnosis Date  . Trichomonas   . Von Willebrand disease   . HSV-2 infection     NO RECENT OUTBREAKS  . Asthma   . Anemia   . Migraine   . Depression   . Pyelonephritis     Past Surgical History  Procedure Date  . Cesarean section   . Dilation and curettage of uterus     History reviewed. No pertinent family history.  History  Substance Use Topics  . Smoking status: Never Smoker   . Smokeless tobacco: Not on file  . Alcohol Use: No    Allergies:  Allergies  Allergen Reactions  . Codeine Nausea And Vomiting  . Darvocet (Propoxyphene N-Acetaminophen) Nausea And Vomiting  .  Sulfa Antibiotics Rash    Prescriptions prior to admission  Medication Sig Dispense Refill  . acetaminophen (TYLENOL) 325 MG tablet Take 650 mg by mouth every 6 (six) hours as needed. For headache.       . albuterol-ipratropium (COMBIVENT) 18-103 MCG/ACT inhaler Inhale 2 puffs into the lungs every 6 (six) hours as needed. Shortness of breathe       . ibuprofen (ADVIL,MOTRIN) 800 MG tablet Take 800 mg by mouth every 8 (eight) hours as needed. forpain       . metroNIDAZOLE (FLAGYL) 500 MG tablet Take 1 tablet (500 mg total) by mouth 2 (two) times daily.  14 tablet  0  . PRENATAL VITAMINS PO Take by mouth.          ROS-see HPI Physical Exam   Blood pressure 124/77, pulse 109, temperature 99 F (37.2 C), temperature source Oral, resp. rate 22, last menstrual period 12/07/2010.  Results for orders placed during the hospital encounter of 04/19/11 (from the past 24 hour(s))  CBC     Status: Abnormal   Collection Time   04/19/11 10:59 PM      Component Value Range  WBC 8.2  4.0 - 10.5 (K/uL)   RBC 3.79 (*) 3.87 - 5.11 (MIL/uL)   Hemoglobin 11.4 (*) 12.0 - 15.0 (g/dL)   HCT 45.4 (*) 09.8 - 46.0 (%)   MCV 91.8  78.0 - 100.0 (fL)   MCH 30.1  26.0 - 34.0 (pg)   MCHC 32.8  30.0 - 36.0 (g/dL)   RDW 11.9  14.7 - 82.9 (%)   Platelets 247  150 - 400 (K/uL)  DIFFERENTIAL     Status: Abnormal   Collection Time   04/19/11 10:59 PM      Component Value Range   Neutrophils Relative 85 (*) 43 - 77 (%)   Neutro Abs 6.9  1.7 - 7.7 (K/uL)   Lymphocytes Relative 7 (*) 12 - 46 (%)   Lymphs Abs 0.6 (*) 0.7 - 4.0 (K/uL)   Monocytes Relative 8  3 - 12 (%)   Monocytes Absolute 0.7  0.1 - 1.0 (K/uL)   Eosinophils Relative 0  0 - 5 (%)   Eosinophils Absolute 0.0  0.0 - 0.7 (K/uL)   Basophils Relative 0  0 - 1 (%)   Basophils Absolute 0.0  0.0 - 0.1 (K/uL)  URINALYSIS, ROUTINE W REFLEX MICROSCOPIC     Status: Abnormal   Collection Time   04/20/11 12:40 AM      Component Value Range   Color, Urine  YELLOW  YELLOW    APPearance CLEAR  CLEAR    Specific Gravity, Urine 1.010  1.005 - 1.030    pH 6.0  5.0 - 8.0    Glucose, UA NEGATIVE  NEGATIVE (mg/dL)   Hgb urine dipstick NEGATIVE  NEGATIVE    Bilirubin Urine NEGATIVE  NEGATIVE    Ketones, ur 40 (*) NEGATIVE (mg/dL)   Protein, ur NEGATIVE  NEGATIVE (mg/dL)   Urobilinogen, UA 0.2  0.0 - 1.0 (mg/dL)   Nitrite NEGATIVE  NEGATIVE    Leukocytes, UA NEGATIVE  NEGATIVE    Physical Exam Gen:  NAD, A&Ox3, malaise, but pleasant Lungs:  CTA bilaterally CV:   RRR Uterus:  FH u/-1, FHT=136 suprapubic area Pelvic deferred Skin:  Warm/dry No CVAT or cervical lympaedema  MAU Course  Procedures 1.  CBC w/ diff 2.  U/a 3.  One liter LR 4.  Physical exam  Assessment and Plan  1.  IUP at 19.1 weeks 2.  Fever--resolved by arrival to MAU after Tylenol and Ibuprofen at home 3.  URI vs influenza 4.  Current treatment for BV 5.  Nml CBC 6.  Previous c/s  1.  Per c/w dr. Estanislado Pandy, d/c home w/ infection precautions 2.  Note OOW this week given and given masks to wear to office for appt at 1330 for anatomy scan 3.  Hand hygiene disc'd and symptom treatment w/ other OTC measures such as Vicks, Mucinex, antihistamine disc'd 4.  F/u 04/20/11 at [facility] as schedule or prn  Eraina Winnie H 04/20/2011, 2:24 AM

## 2011-04-20 NOTE — Progress Notes (Signed)
Ann [name], CNM notified of pt.  Presenting with flu like symptoms.  Orders have been placed for pt. CNM is pushing with pt and will be down to see pt. Since after.

## 2011-05-11 HISTORY — PX: TUBAL LIGATION: SHX77

## 2011-06-18 ENCOUNTER — Other Ambulatory Visit: Payer: Self-pay | Admitting: Obstetrics and Gynecology

## 2011-07-06 ENCOUNTER — Encounter: Payer: Self-pay | Admitting: Obstetrics and Gynecology

## 2011-07-07 ENCOUNTER — Encounter (INDEPENDENT_AMBULATORY_CARE_PROVIDER_SITE_OTHER): Payer: 59 | Admitting: Obstetrics and Gynecology

## 2011-07-07 DIAGNOSIS — Z331 Pregnant state, incidental: Secondary | ICD-10-CM

## 2011-07-12 ENCOUNTER — Encounter (INDEPENDENT_AMBULATORY_CARE_PROVIDER_SITE_OTHER): Payer: 59 | Admitting: Registered Nurse

## 2011-07-12 DIAGNOSIS — O368390 Maternal care for abnormalities of the fetal heart rate or rhythm, unspecified trimester, not applicable or unspecified: Secondary | ICD-10-CM

## 2011-07-12 DIAGNOSIS — Z331 Pregnant state, incidental: Secondary | ICD-10-CM

## 2011-07-22 ENCOUNTER — Encounter (INDEPENDENT_AMBULATORY_CARE_PROVIDER_SITE_OTHER): Payer: 59 | Admitting: Obstetrics and Gynecology

## 2011-07-22 DIAGNOSIS — Z331 Pregnant state, incidental: Secondary | ICD-10-CM

## 2011-07-23 ENCOUNTER — Encounter (INDEPENDENT_AMBULATORY_CARE_PROVIDER_SITE_OTHER): Payer: 59 | Admitting: Obstetrics and Gynecology

## 2011-07-23 ENCOUNTER — Other Ambulatory Visit: Payer: 59

## 2011-07-23 DIAGNOSIS — O26849 Uterine size-date discrepancy, unspecified trimester: Secondary | ICD-10-CM

## 2011-07-23 DIAGNOSIS — E669 Obesity, unspecified: Secondary | ICD-10-CM

## 2011-07-29 ENCOUNTER — Other Ambulatory Visit: Payer: 59

## 2011-07-29 ENCOUNTER — Encounter (INDEPENDENT_AMBULATORY_CARE_PROVIDER_SITE_OTHER): Payer: 59 | Admitting: Obstetrics and Gynecology

## 2011-07-29 DIAGNOSIS — E669 Obesity, unspecified: Secondary | ICD-10-CM

## 2011-07-29 DIAGNOSIS — O34219 Maternal care for unspecified type scar from previous cesarean delivery: Secondary | ICD-10-CM

## 2011-07-30 ENCOUNTER — Other Ambulatory Visit: Payer: Self-pay | Admitting: Obstetrics and Gynecology

## 2011-08-05 ENCOUNTER — Inpatient Hospital Stay (HOSPITAL_COMMUNITY)
Admission: AD | Admit: 2011-08-05 | Discharge: 2011-08-05 | Disposition: A | Discharge status: Home / Self Care | Payer: 59 | Source: Ambulatory Visit | Attending: Obstetrics and Gynecology | Admitting: Obstetrics and Gynecology

## 2011-08-05 ENCOUNTER — Encounter (HOSPITAL_COMMUNITY): Payer: Self-pay | Admitting: *Deleted

## 2011-08-05 ENCOUNTER — Inpatient Hospital Stay (HOSPITAL_COMMUNITY): Payer: 59

## 2011-08-05 ENCOUNTER — Encounter (INDEPENDENT_AMBULATORY_CARE_PROVIDER_SITE_OTHER): Payer: 59 | Admitting: Registered Nurse

## 2011-08-05 DIAGNOSIS — Z3480 Encounter for supervision of other normal pregnancy, unspecified trimester: Secondary | ICD-10-CM

## 2011-08-05 DIAGNOSIS — O368390 Maternal care for abnormalities of the fetal heart rate or rhythm, unspecified trimester, not applicable or unspecified: Secondary | ICD-10-CM

## 2011-08-05 DIAGNOSIS — Z87898 Personal history of other specified conditions: Secondary | ICD-10-CM

## 2011-08-05 DIAGNOSIS — O409XX0 Polyhydramnios, unspecified trimester, not applicable or unspecified: Secondary | ICD-10-CM

## 2011-08-05 DIAGNOSIS — O99210 Obesity complicating pregnancy, unspecified trimester: Secondary | ICD-10-CM

## 2011-08-05 DIAGNOSIS — E669 Obesity, unspecified: Secondary | ICD-10-CM

## 2011-08-05 DIAGNOSIS — O368190 Decreased fetal movements, unspecified trimester, not applicable or unspecified: Secondary | ICD-10-CM

## 2011-08-05 NOTE — MAU Note (Signed)
Non-reactive tracing in office, sent over for further eval  Denies elevated BP or diabetes.

## 2011-08-05 NOTE — Discharge Instructions (Signed)
Fetal Movement Counts Patient Name: __________________________________________________ Patient Due Date: ____________________ Kick counts is highly recommended in high risk pregnancies, but it is a good idea for every pregnant woman to do. Start counting fetal movements at 28 weeks of the pregnancy. Fetal movements increase after eating a full meal or eating or drinking something sweet (the blood sugar is higher). It is also important to drink plenty of fluids (well hydrated) before doing the count. Lie on your left side because it helps with the circulation or you can sit in a comfortable chair with your arms over your belly (abdomen) with no distractions around you. DOING THE COUNT  Try to do the count the same time of day each time you do it.   Mark the day and time, then see how long it takes for you to feel 10 movements (kicks, flutters, swishes, rolls). You should have at least 10 movements within 2 hours. You will most likely feel 10 movements in much less than 2 hours. If you do not, wait an hour and count again. After a couple of days you will see a pattern.   What you are looking for is a change in the pattern or not enough counts in 2 hours. Is it taking longer in time to reach 10 movements?  SEEK MEDICAL CARE IF:  You feel less than 10 counts in 2 hours. Tried twice.   No movement in one hour.   The pattern is changing or taking longer each day to reach 10 counts in 2 hours.   You feel the baby is not moving as it usually does.  Date: ____________ Movements: ____________ Start time: ____________ Finish time: ____________  Date: ____________ Movements: ____________ Start time: ____________ Finish time: ____________ Date: ____________ Movements: ____________ Start time: ____________ Finish time: ____________ Date: ____________ Movements: ____________ Start time: ____________ Finish time: ____________ Date: ____________ Movements: ____________ Start time: ____________ Finish time:  ____________ Date: ____________ Movements: ____________ Start time: ____________ Finish time: ____________ Date: ____________ Movements: ____________ Start time: ____________ Finish time: ____________ Date: ____________ Movements: ____________ Start time: ____________ Finish time: ____________  Date: ____________ Movements: ____________ Start time: ____________ Finish time: ____________ Date: ____________ Movements: ____________ Start time: ____________ Finish time: ____________ Date: ____________ Movements: ____________ Start time: ____________ Finish time: ____________ Date: ____________ Movements: ____________ Start time: ____________ Finish time: ____________ Date: ____________ Movements: ____________ Start time: ____________ Finish time: ____________ Date: ____________ Movements: ____________ Start time: ____________ Finish time: ____________ Date: ____________ Movements: ____________ Start time: ____________ Finish time: ____________  Date: ____________ Movements: ____________ Start time: ____________ Finish time: ____________ Date: ____________ Movements: ____________ Start time: ____________ Finish time: ____________ Date: ____________ Movements: ____________ Start time: ____________ Finish time: ____________ Date: ____________ Movements: ____________ Start time: ____________ Finish time: ____________ Date: ____________ Movements: ____________ Start time: ____________ Finish time: ____________ Date: ____________ Movements: ____________ Start time: ____________ Finish time: ____________ Date: ____________ Movements: ____________ Start time: ____________ Finish time: ____________  Date: ____________ Movements: ____________ Start time: ____________ Finish time: ____________ Date: ____________ Movements: ____________ Start time: ____________ Finish time: ____________ Date: ____________ Movements: ____________ Start time: ____________ Finish time: ____________ Date: ____________ Movements:  ____________ Start time: ____________ Finish time: ____________ Date: ____________ Movements: ____________ Start time: ____________ Finish time: ____________ Date: ____________ Movements: ____________ Start time: ____________ Finish time: ____________ Date: ____________ Movements: ____________ Start time: ____________ Finish time: ____________  Date: ____________ Movements: ____________ Start time: ____________ Finish time: ____________ Date: ____________ Movements: ____________ Start time: ____________ Finish time: ____________ Date: ____________ Movements: ____________ Start time:   ____________ Finish time: ____________ Date: ____________ Movements: ____________ Start time: ____________ Finish time: ____________ Date: ____________ Movements: ____________ Start time: ____________ Finish time: ____________ Date: ____________ Movements: ____________ Start time: ____________ Finish time: ____________ Date: ____________ Movements: ____________ Start time: ____________ Finish time: ____________  Date: ____________ Movements: ____________ Start time: ____________ Finish time: ____________ Date: ____________ Movements: ____________ Start time: ____________ Finish time: ____________ Date: ____________ Movements: ____________ Start time: ____________ Finish time: ____________ Date: ____________ Movements: ____________ Start time: ____________ Finish time: ____________ Date: ____________ Movements: ____________ Start time: ____________ Finish time: ____________ Date: ____________ Movements: ____________ Start time: ____________ Finish time: ____________ Date: ____________ Movements: ____________ Start time: ____________ Finish time: ____________  Date: ____________ Movements: ____________ Start time: ____________ Finish time: ____________ Date: ____________ Movements: ____________ Start time: ____________ Finish time: ____________ Date: ____________ Movements: ____________ Start time: ____________ Finish  time: ____________ Date: ____________ Movements: ____________ Start time: ____________ Finish time: ____________ Date: ____________ Movements: ____________ Start time: ____________ Finish time: ____________ Date: ____________ Movements: ____________ Start time: ____________ Finish time: ____________ Date: ____________ Movements: ____________ Start time: ____________ Finish time: ____________  Date: ____________ Movements: ____________ Start time: ____________ Finish time: ____________ Date: ____________ Movements: ____________ Start time: ____________ Finish time: ____________ Date: ____________ Movements: ____________ Start time: ____________ Finish time: ____________ Date: ____________ Movements: ____________ Start time: ____________ Finish time: ____________ Date: ____________ Movements: ____________ Start time: ____________ Finish time: ____________ Date: ____________ Movements: ____________ Start time: ____________ Finish time: ____________ Document Released: 05/26/2006 Document Revised: 04/15/2011 Document Reviewed: 11/26/2008 [employer] Patient Information 2012 [employer]. 

## 2011-08-05 NOTE — MAU Provider Note (Signed)
History   Ann [name] is a 31y.o. Obese BF who presents at 34.3 weeks from office for further external monitoring and BPP.  Reports decreased fetal movement yesterday and only recalls feeling fetus move "one" time on Tuesday (today is Thursday).  Nonreactive NST at office.  Denies VB, LOF, ctxs, PIH or UTI s/s.  Reports u/s about 2 weeks ago for EFW (69%) and Polyhydramnios noted.  Pt has been taken OOW recently secondary to increasing back pain and has been referred to PT:  Pt has history of several back surgeries for Discectomies and fusion.  Dr. Estanislado [name] has been in consultation w/ Dr. Cyndie [name] r/e pt's upcoming repeat c/s and it's impact r/t pt's Von Willebrand. Pregnancy r/f:   1.  Previous c/s w/ desire for repeat  (also planning BTL and 30-day papers on chart) 2.  Von Willebrand dz 3.  [name]/o PP HTN after delivery in 2009 4. Obese 5.  Infertility and this is [facility] pregnancy 6.  [name]/o HSV II 7.  [name]/o Trich (last incident in 1st trimester this pregnancy) 8.  Asthma 9.  Polyhydramnios on most recent u/s at office 10.  Elevated 1hr gtt but normal 3hr gtt 11.  Codeine/Darvocet allergies 12.  Significant back pain this pregnancy w/ [name]/o multiple back surgeries  CSN: [id number]  Arrival date and time: 08/05/11 1245   None     Chief Complaint  Patient presents with  . Decreased Fetal Movement   HPI  OB History    Grav Para Term Preterm Abortions TAB SAB Ect Mult Living   3 1 1  1 1    1       Past Medical History  Diagnosis Date  . Trichomonas   . Von Willebrand disease   . HSV-2 infection     NO RECENT OUTBREAKS  . Asthma   . Anemia   . Migraine   . Depression   . Pyelonephritis     Past Surgical History  Procedure Date  . Cesarean section   . Dilation and curettage of uterus   . Back surgery     4 back surgeries  . Laparoscopy     History reviewed. No pertinent family history.  History  Substance Use Topics  . Smoking status: Never Smoker   . Smokeless  tobacco: Not on file  . Alcohol Use: No    Allergies:  Allergies  Allergen Reactions  . Codeine Nausea And Vomiting  . Darvocet (Propoxacet-N) Nausea And Vomiting  . Sulfa Antibiotics Rash    Prescriptions prior to admission  Medication Sig Dispense Refill  . acetaminophen (TYLENOL) 325 MG tablet Take 650 mg by mouth every 6 (six) hours as needed. For headache.       . cyclobenzaprine (FLEXERIL) 10 MG tablet Take 10 mg by mouth 3 (three) times daily as needed. For back pain      . ondansetron (ZOFRAN-ODT) 8 MG disintegrating tablet Take 8 mg by mouth every 8 (eight) hours as needed. nausea      . PRENATAL VITAMINS PO Take by mouth.          ROS--see history above Physical Exam  EFM:  140, reactive, no decels, moderate variability TOCO; no discernable ctxs or UI  Blood pressure 121/62, pulse 93, temperature 98.2 F (36.8 C), temperature source Oral, resp. rate 20, height 5' 2.25" (1.581 m), weight 122.471 kg (270 lb), last menstrual period 12/07/2010.  Physical Exam  Constitutional: She is oriented to person, place, and time. She appears well-developed and  well-nourished. No distress.  HENT:  Head: Atraumatic.  Cardiovascular: Normal rate.   Respiratory: Effort normal.  GI: Soft.       gravid  Genitourinary:       Pelvic exam deferred  Neurological: She is alert and oriented to person, place, and time.  Skin: Skin is warm and dry.  Psychiatric: She has a normal mood and affect. Her behavior is normal. Thought content normal.    MAU Course  Procedures 1.  External monitoring 2.  Limited OB u/s:  BPP 8/8; AFI subjectively WNL  Assessment and Plan  1.  IUP at 34.3 2.  [name]/o decreased fetal movement this week 3.  Reactive nst here and 8/8 BPP 4.  Obese 5.  Previous c/s w/ repeat scheduled and BTL planned 6.  No s/s of PTL 7. Von Willebrand dz 8.  [name]/o Polyhydramnios on last u/s at office  1. Per c/w Dr. Pennie [name], pt d/c'd home w/ [facility] and PTL precautions 2.  F/u as  scheduled at [facility] or prn any questions or concerns  Ann [name] 08/05/2011, 3:00 PM

## 2011-08-10 ENCOUNTER — Encounter (INDEPENDENT_AMBULATORY_CARE_PROVIDER_SITE_OTHER): Payer: 59 | Admitting: Obstetrics and Gynecology

## 2011-08-10 DIAGNOSIS — Z331 Pregnant state, incidental: Secondary | ICD-10-CM

## 2011-08-17 ENCOUNTER — Encounter (HOSPITAL_COMMUNITY): Payer: Self-pay | Admitting: Pharmacist

## 2011-08-17 ENCOUNTER — Encounter: Payer: Self-pay | Admitting: Obstetrics and Gynecology

## 2011-08-17 ENCOUNTER — Ambulatory Visit (INDEPENDENT_AMBULATORY_CARE_PROVIDER_SITE_OTHER): Payer: 59 | Admitting: Obstetrics and Gynecology

## 2011-08-17 VITALS — BP 112/70 | Ht 63.0 in | Wt 274.0 lb

## 2011-08-17 DIAGNOSIS — Z331 Pregnant state, incidental: Secondary | ICD-10-CM

## 2011-08-17 MED ORDER — VALACYCLOVIR HCL 500 MG PO TABS: 500.0000 mg | ORAL_TABLET | Freq: Every day | ORAL | Status: DC

## 2011-08-17 NOTE — Progress Notes (Signed)
GBS Negative PG CMA

## 2011-08-17 NOTE — Progress Notes (Addendum)
No complaints. Rx to start Valtrex now Reviewed GBS neg FKCs C/S scheduled for 09/10/11

## 2011-08-24 ENCOUNTER — Encounter: Payer: 59 | Admitting: Obstetrics and Gynecology

## 2011-08-25 ENCOUNTER — Ambulatory Visit (INDEPENDENT_AMBULATORY_CARE_PROVIDER_SITE_OTHER): Payer: 59 | Admitting: Obstetrics and Gynecology

## 2011-08-25 ENCOUNTER — Encounter: Payer: Self-pay | Admitting: Obstetrics and Gynecology

## 2011-08-25 VITALS — BP 120/68 | Wt 272.0 lb

## 2011-08-25 DIAGNOSIS — O368190 Decreased fetal movements, unspecified trimester, not applicable or unspecified: Secondary | ICD-10-CM

## 2011-08-25 NOTE — Progress Notes (Signed)
NST TODAY

## 2011-08-25 NOTE — Progress Notes (Signed)
C/o decreased fetal movemenmt even with kick counts.NST REACTIVE Plan NST 2X/ WK now till delivery on 09/10/11

## 2011-08-27 ENCOUNTER — Ambulatory Visit: Payer: 59 | Admitting: Obstetrics and Gynecology

## 2011-08-27 VITALS — BP 118/70

## 2011-08-27 DIAGNOSIS — O368190 Decreased fetal movements, unspecified trimester, not applicable or unspecified: Secondary | ICD-10-CM

## 2011-08-27 NOTE — Progress Notes (Signed)
NST Only today/ read by VL/ Reactive. Alvino Chapel

## 2011-08-31 ENCOUNTER — Ambulatory Visit (INDEPENDENT_AMBULATORY_CARE_PROVIDER_SITE_OTHER): Payer: 59 | Admitting: Obstetrics and Gynecology

## 2011-08-31 ENCOUNTER — Encounter: Payer: Self-pay | Admitting: Obstetrics and Gynecology

## 2011-08-31 VITALS — BP 132/82 | Wt 274.0 lb

## 2011-08-31 DIAGNOSIS — O368190 Decreased fetal movements, unspecified trimester, not applicable or unspecified: Secondary | ICD-10-CM

## 2011-08-31 NOTE — Progress Notes (Signed)
NST reactive.  Repeat 09/03/11, 09/07/11.  For repeat cs on 09/10/11.

## 2011-09-03 ENCOUNTER — Ambulatory Visit (INDEPENDENT_AMBULATORY_CARE_PROVIDER_SITE_OTHER): Payer: 59

## 2011-09-03 ENCOUNTER — Other Ambulatory Visit: Payer: Self-pay | Admitting: Obstetrics and Gynecology

## 2011-09-03 ENCOUNTER — Ambulatory Visit (INDEPENDENT_AMBULATORY_CARE_PROVIDER_SITE_OTHER): Payer: 59 | Admitting: Obstetrics and Gynecology

## 2011-09-03 VITALS — BP 112/64

## 2011-09-03 DIAGNOSIS — O289 Unspecified abnormal findings on antenatal screening of mother: Secondary | ICD-10-CM

## 2011-09-03 DIAGNOSIS — O288 Other abnormal findings on antenatal screening of mother: Secondary | ICD-10-CM

## 2011-09-03 DIAGNOSIS — O09899 Supervision of other high risk pregnancies, unspecified trimester: Secondary | ICD-10-CM

## 2011-09-03 NOTE — H&P (Signed)
Ann [name] is a 31 y.o. female g3 p1 EDC 5/6 per LMP of 7/30 10 week US agrees with EDC, presenting for repeat cesarean section and bilateral tubal ligation. Denies contractions, SROM, vag bleeding,  with +Fetal movement, No visual spots, epigastric pain, with swelling to ankles only. No HSV outbreak symptoms.  Problem list: HX cesarean section Von Willebrand disease Obesity hemorrhage Hx depression 1st trimester trichamonas Polyhydramnios Asthma HSV 2 Migraines infertity Hx pyelonephritis  Medications: PNV, valtrex 500 mg po daily, iron daily                      .................  @IPILAPH@ OB History    Grav Para Term Preterm Abortions TAB SAB Ect Mult Living   3 1 1  1 1    1    1997 EAB 03/2003 39 weeks 3 days labor C/S   Past Medical History  Diagnosis Date  . Trichomonas   . Von Willebrand disease   . HSV-2 infection     NO RECENT OUTBREAKS  . Asthma   . Anemia   . Migraine   . Depression   . Pyelonephritis   . Spinal headache   . Abnormal Pap smear 2010    colpo  . H/O varicella   . H/O cystitis   . H/O pyelonephritis     frequently during childhood  . Alcoholic     daily   Past Surgical History  Procedure Date  . Dilation and curettage of uterus   . Back surgery     4 back surgeries  . Laparoscopy 2011  . Cesarean section 2004  . Wisdom tooth extraction 2001  D&C 1998 Hysteroscopy 2011 Family History: family history includes Arthritis in her mother; COPD in her father; Depression in her mother; Heart disease in her paternal grandfather; Kidney disease in her paternal uncle; and Stroke in her maternal grandfather and mother. Social History:  reports that she has never smoked. She has never used smokeless tobacco. She reports that she does not drink alcohol or use illicit drugs. 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 Physical exam: Calm, no distress, lungs clear bilaterally, AP RRR, abd soft, gravid, nt, bowel sounds active trace edema to lower extremities, fhts per US bedside 150  Prenatal labs: ABO, Rh:  O postive       Antibody:  neg Rubella:  Immune RPR: Nonreactive (09/04 0000)  HBsAg: Negative (09/04 0000)  HIV: Non-reactive (09/04 0000)  GBS:   neg 1 gtt 135, 3 gtt WNL Assessment/Plan: 39 4/7 week IUP Repeat cesarean section and BTL, preop DDAVP 0.3 mg/kg in NS 100 ml over 30 minutes pre op, post op x 5d ays Stimate 1.5 mg/ml  Nasal spray each nostril pt has brought to hospital  Collaboration with Dr. Rivard Ann [name] 09/03/2011, 9:47 PM Ann [name], CNM  

## 2011-09-03 NOTE — Progress Notes (Signed)
NST only

## 2011-09-03 NOTE — Progress Notes (Signed)
NST non-reactive     BPP 8/8

## 2011-09-05 ENCOUNTER — Telehealth: Payer: Self-pay | Admitting: Advanced Practice Midwife

## 2011-09-05 NOTE — Telephone Encounter (Signed)
38.6 weeks. Hx C/S and Von Willebrand's. Called about seeing small amount of red blood and mucus w/ wiping. BH UC's last night. Pos FM. Denies LOF or abd pain. Likely bloody show. Bleeding and labor precautions. FKC's.

## 2011-09-07 ENCOUNTER — Ambulatory Visit (INDEPENDENT_AMBULATORY_CARE_PROVIDER_SITE_OTHER): Payer: 59 | Admitting: Obstetrics and Gynecology

## 2011-09-07 ENCOUNTER — Encounter (HOSPITAL_COMMUNITY): Payer: Self-pay

## 2011-09-07 ENCOUNTER — Encounter (HOSPITAL_COMMUNITY)
Admission: RE | Admit: 2011-09-07 | Discharge: 2011-09-07 | Disposition: A | Discharge status: Home / Self Care | Payer: 59 | Source: Ambulatory Visit | Attending: Obstetrics and Gynecology | Admitting: Obstetrics and Gynecology

## 2011-09-07 VITALS — BP 130/74 | Wt 277.0 lb

## 2011-09-07 DIAGNOSIS — O139 Gestational [pregnancy-induced] hypertension without significant proteinuria, unspecified trimester: Secondary | ICD-10-CM

## 2011-09-07 DIAGNOSIS — IMO0002 Reserved for concepts with insufficient information to code with codable children: Secondary | ICD-10-CM

## 2011-09-07 DIAGNOSIS — O0990 Supervision of high risk pregnancy, unspecified, unspecified trimester: Secondary | ICD-10-CM

## 2011-09-07 HISTORY — DX: Disease of blood and blood-forming organs, unspecified: D75.9

## 2011-09-07 LAB — SURGICAL PCR SCREEN
MRSA, PCR: NEGATIVE
Staphylococcus aureus: NEGATIVE

## 2011-09-07 LAB — CBC
HCT: 32.6 % — ABNORMAL LOW (ref 36.0–46.0)
HCT: 32.4 % — ABNORMAL LOW (ref 36.0–46.0)
Hemoglobin: 10.4 g/dL — ABNORMAL LOW (ref 12.0–15.0)
MCH: 30.1 pg (ref 26.0–34.0)
MCHC: 32.7 g/dL (ref 30.0–36.0)
MCV: 92 fL (ref 78.0–100.0)
RDW: 14.8 % (ref 11.5–15.5)
RDW: 14.7 % (ref 11.5–15.5)
WBC: 11.4 10*3/uL — ABNORMAL HIGH (ref 4.0–10.5)

## 2011-09-07 LAB — COMPREHENSIVE METABOLIC PANEL
ALT: 10 U/L (ref 0–35)
AST: 14 U/L (ref 0–37)
Calcium: 9.6 mg/dL (ref 8.4–10.5)
Chloride: 104 mEq/L (ref 96–112)
Creat: 0.59 mg/dL (ref 0.50–1.10)

## 2011-09-07 NOTE — Patient Instructions (Addendum)
[name]  09/07/2011   Your procedure is scheduled on:  09/10/11  Enter through the Main Entrance of [facility] at 8 AM.  Pick up the phone at the desk and dial [phone].   Call this number if you have problems the morning of surgery: [phone]   Remember:   Do not eat food:After Midnight.  Do not drink clear liquids: After Midnight.  Take these medicines the morning of surgery with A SIP OF WATER: NA   Do not wear jewelry, make-up or nail polish.  Do not wear lotions, powders, or perfumes. You may wear deodorant.  Do not shave 48 hours prior to surgery.  Do not bring valuables to the hospital.  Contacts, dentures or bridgework may not be worn into surgery.  Leave suitcase in the car. After surgery it may be brought to your room.  For patients admitted to the hospital, checkout time is 11:00 AM the day of discharge.   Patients discharged the day of surgery will not be allowed to drive home.  Name and phone number of your driver: NA  Special Instructions: CHG Shower Use Special Wash: 1/2 bottle night before surgery and 1/2 bottle morning of surgery.   Please read over the following fact sheets that you were given: MRSA Information

## 2011-09-07 NOTE — Progress Notes (Signed)
The patient has had a mild headache.  She denies blurry vision and right upper quadrant tenderness.  Her abdomen is nontender.  Her reflexes are normal.  She has 1+ edema.  PIH labs were sent today and are normal.  Her hemoglobin is 10.6.  Preeclampsia discussed.  The patient will call if symptoms worsen.  Preparation for surgery was discussed.  Dr. Stefano Gaul

## 2011-09-07 NOTE — Progress Notes (Signed)
Pt states her BP was high today when she went to [facility] for her Pre Op BP read 154/75 the first time, and 175/89 the second time.

## 2011-09-09 MED ORDER — DEXTROSE 5 % IV SOLN
2.0000 g | INTRAVENOUS | Status: AC
  Administered 2011-09-10: 2 g via INTRAVENOUS
  Filled 2011-09-09: qty 2

## 2011-09-10 ENCOUNTER — Encounter (HOSPITAL_COMMUNITY): Payer: Self-pay | Admitting: Anesthesiology

## 2011-09-10 ENCOUNTER — Inpatient Hospital Stay (HOSPITAL_COMMUNITY)
Admission: RE | Admit: 2011-09-10 | Discharge: 2011-09-13 | DRG: 765 | Disposition: A | Discharge status: Home / Self Care | Payer: 59 | Source: Ambulatory Visit | Attending: Obstetrics and Gynecology | Admitting: Obstetrics and Gynecology

## 2011-09-10 ENCOUNTER — Inpatient Hospital Stay (HOSPITAL_COMMUNITY): Payer: 59 | Admitting: Anesthesiology

## 2011-09-10 ENCOUNTER — Encounter (HOSPITAL_COMMUNITY): Payer: Self-pay | Admitting: *Deleted

## 2011-09-10 ENCOUNTER — Encounter (HOSPITAL_COMMUNITY)
Admission: RE | Disposition: A | Discharge status: Home / Self Care | Payer: Self-pay | Source: Ambulatory Visit | Attending: Obstetrics and Gynecology

## 2011-09-10 DIAGNOSIS — Z01812 Encounter for preprocedural laboratory examination: Secondary | ICD-10-CM

## 2011-09-10 DIAGNOSIS — Z302 Encounter for sterilization: Secondary | ICD-10-CM

## 2011-09-10 DIAGNOSIS — Z01818 Encounter for other preprocedural examination: Secondary | ICD-10-CM

## 2011-09-10 DIAGNOSIS — O9912 Other diseases of the blood and blood-forming organs and certain disorders involving the immune mechanism complicating childbirth: Secondary | ICD-10-CM | POA: Diagnosis present

## 2011-09-10 DIAGNOSIS — D649 Anemia, unspecified: Secondary | ICD-10-CM | POA: Diagnosis not present

## 2011-09-10 DIAGNOSIS — D6800 Von Willebrand disease, unspecified: Secondary | ICD-10-CM | POA: Diagnosis present

## 2011-09-10 DIAGNOSIS — O34219 Maternal care for unspecified type scar from previous cesarean delivery: Secondary | ICD-10-CM

## 2011-09-10 DIAGNOSIS — O409XX0 Polyhydramnios, unspecified trimester, not applicable or unspecified: Secondary | ICD-10-CM | POA: Diagnosis present

## 2011-09-10 DIAGNOSIS — O9903 Anemia complicating the puerperium: Secondary | ICD-10-CM | POA: Diagnosis not present

## 2011-09-10 DIAGNOSIS — E669 Obesity, unspecified: Secondary | ICD-10-CM | POA: Diagnosis present

## 2011-09-10 DIAGNOSIS — D689 Coagulation defect, unspecified: Secondary | ICD-10-CM | POA: Diagnosis present

## 2011-09-10 SURGERY — Surgical Case
Anesthesia: Spinal | Site: Abdomen | Laterality: Bilateral | Wound class: Clean Contaminated

## 2011-09-10 MED ORDER — LACTATED RINGERS IV SOLN
INTRAVENOUS | Status: DC
  Administered 2011-09-10 (×4): via INTRAVENOUS

## 2011-09-10 MED ORDER — DIPHENHYDRAMINE HCL 50 MG/ML IJ SOLN: 12.5000 mg | Freq: Four times a day (QID) | INTRAMUSCULAR | Status: DC | PRN

## 2011-09-10 MED ORDER — OXYTOCIN 20 UNITS IN LACTATED RINGERS INFUSION - SIMPLE: 125.0000 mL/h | INTRAVENOUS | Status: AC

## 2011-09-10 MED ORDER — BUPIVACAINE HCL (PF) 0.25 % IJ SOLN
INTRAMUSCULAR | Status: AC
  Filled 2011-09-10: qty 30

## 2011-09-10 MED ORDER — PRENATAL MULTIVITAMIN CH
1.0000 | ORAL_TABLET | Freq: Every day | ORAL | Status: DC
  Administered 2011-09-11 – 2011-09-13 (×3): 1 via ORAL
  Filled 2011-09-10 (×3): qty 1

## 2011-09-10 MED ORDER — DIPHENHYDRAMINE HCL 50 MG/ML IJ SOLN: 25.0000 mg | INTRAMUSCULAR | Status: DC | PRN

## 2011-09-10 MED ORDER — NALBUPHINE HCL 10 MG/ML IJ SOLN
5.0000 mg | INTRAMUSCULAR | Status: DC | PRN
  Administered 2011-09-10: 5 mg via SUBCUTANEOUS
  Administered 2011-09-10 – 2011-09-11 (×2): 10 mg via SUBCUTANEOUS
  Filled 2011-09-10 (×2): qty 1

## 2011-09-10 MED ORDER — METHYLERGONOVINE MALEATE 0.2 MG/ML IJ SOLN: 0.2000 mg | INTRAMUSCULAR | Status: DC | PRN

## 2011-09-10 MED ORDER — SCOPOLAMINE 1 MG/3DAYS TD PT72
1.0000 | MEDICATED_PATCH | Freq: Once | TRANSDERMAL | Status: DC
  Administered 2011-09-10: 1.5 mg via TRANSDERMAL

## 2011-09-10 MED ORDER — IBUPROFEN 600 MG PO TABS: 600.0000 mg | ORAL_TABLET | Freq: Four times a day (QID) | ORAL | Status: DC | PRN

## 2011-09-10 MED ORDER — ONDANSETRON HCL 4 MG PO TABS: 4.0000 mg | ORAL_TABLET | ORAL | Status: DC | PRN

## 2011-09-10 MED ORDER — OXYTOCIN 10 UNIT/ML IJ SOLN
INTRAMUSCULAR | Status: DC | PRN
  Administered 2011-09-10 (×2): 20 [IU] via INTRAMUSCULAR

## 2011-09-10 MED ORDER — SODIUM CHLORIDE 0.9 % IV SOLN
0.3000 ug/kg | INTRAVENOUS | Status: AC
  Administered 2011-09-10: 36.4 ug via INTRAVENOUS
  Filled 2011-09-10: qty 9.1

## 2011-09-10 MED ORDER — TETANUS-DIPHTH-ACELL PERTUSSIS 5-2.5-18.5 LF-MCG/0.5 IM SUSP: 0.5000 mL | Freq: Once | INTRAMUSCULAR | Status: DC

## 2011-09-10 MED ORDER — SODIUM CHLORIDE 0.9 % IJ SOLN: 9.0000 mL | INTRAMUSCULAR | Status: DC | PRN

## 2011-09-10 MED ORDER — DIBUCAINE 1 % RE OINT: 1.0000 "application " | TOPICAL_OINTMENT | RECTAL | Status: DC | PRN

## 2011-09-10 MED ORDER — METOCLOPRAMIDE HCL 5 MG/ML IJ SOLN: 10.0000 mg | Freq: Three times a day (TID) | INTRAMUSCULAR | Status: DC | PRN

## 2011-09-10 MED ORDER — HYDROMORPHONE HCL PF 1 MG/ML IJ SOLN
0.5000 mg | Freq: Once | INTRAMUSCULAR | Status: AC
  Administered 2011-09-10: 0.5 mg via INTRAVENOUS
  Filled 2011-09-10: qty 1

## 2011-09-10 MED ORDER — MENTHOL 3 MG MT LOZG
1.0000 | LOZENGE | OROMUCOSAL | Status: DC | PRN
  Administered 2011-09-11: 3 mg via ORAL
  Filled 2011-09-10: qty 9

## 2011-09-10 MED ORDER — KETOROLAC TROMETHAMINE 30 MG/ML IJ SOLN: 30.0000 mg | Freq: Four times a day (QID) | INTRAMUSCULAR | Status: AC | PRN

## 2011-09-10 MED ORDER — NALBUPHINE SYRINGE 5 MG/0.5 ML
INJECTION | INTRAMUSCULAR | Status: AC
  Administered 2011-09-10: 5 mg via SUBCUTANEOUS
  Filled 2011-09-10: qty 0.5

## 2011-09-10 MED ORDER — BUPIVACAINE HCL (PF) 0.25 % IJ SOLN
INTRAMUSCULAR | Status: DC | PRN
  Administered 2011-09-10: 10 mL

## 2011-09-10 MED ORDER — HYDROMORPHONE 0.3 MG/ML IV SOLN
INTRAVENOUS | Status: DC
  Administered 2011-09-10: 17:00:00 via INTRAVENOUS
  Administered 2011-09-10: 4.59 mg via INTRAVENOUS
  Administered 2011-09-11: 5.19 mg via INTRAVENOUS
  Administered 2011-09-11: 0.79 mg via INTRAVENOUS
  Administered 2011-09-11: 10:00:00 via INTRAVENOUS
  Administered 2011-09-11: 0.3 mL via INTRAVENOUS
  Administered 2011-09-11: 2.79 mg via INTRAVENOUS
  Filled 2011-09-10 (×3): qty 25

## 2011-09-10 MED ORDER — SODIUM CHLORIDE 0.9 % IV SOLN
1.0000 ug/kg/h | INTRAVENOUS | Status: DC | PRN
  Filled 2011-09-10: qty 2.5

## 2011-09-10 MED ORDER — DIPHENHYDRAMINE HCL 12.5 MG/5ML PO ELIX
12.5000 mg | ORAL_SOLUTION | Freq: Four times a day (QID) | ORAL | Status: DC | PRN
  Filled 2011-09-10: qty 5

## 2011-09-10 MED ORDER — ONDANSETRON HCL 4 MG/2ML IJ SOLN: 4.0000 mg | Freq: Three times a day (TID) | INTRAMUSCULAR | Status: DC | PRN

## 2011-09-10 MED ORDER — FENTANYL CITRATE 0.05 MG/ML IJ SOLN
25.0000 ug | INTRAMUSCULAR | Status: DC | PRN
  Administered 2011-09-10: 50 ug via INTRAVENOUS

## 2011-09-10 MED ORDER — ONDANSETRON HCL 4 MG/2ML IJ SOLN: 4.0000 mg | Freq: Four times a day (QID) | INTRAMUSCULAR | Status: DC | PRN

## 2011-09-10 MED ORDER — KETOROLAC TROMETHAMINE 30 MG/ML IJ SOLN: 30.0000 mg | Freq: Four times a day (QID) | INTRAMUSCULAR | Status: DC | PRN

## 2011-09-10 MED ORDER — PHENYLEPHRINE HCL 10 MG/ML IJ SOLN
INTRAMUSCULAR | Status: DC | PRN
  Administered 2011-09-10: 40 ug via INTRAVENOUS
  Administered 2011-09-10 (×4): 80 ug via INTRAVENOUS
  Administered 2011-09-10: 40 ug via INTRAVENOUS

## 2011-09-10 MED ORDER — EPHEDRINE SULFATE 50 MG/ML IJ SOLN
INTRAMUSCULAR | Status: DC | PRN
  Administered 2011-09-10 (×5): 10 mg via INTRAVENOUS

## 2011-09-10 MED ORDER — SCOPOLAMINE 1 MG/3DAYS TD PT72: 1.0000 | MEDICATED_PATCH | Freq: Once | TRANSDERMAL | Status: DC

## 2011-09-10 MED ORDER — DIPHENHYDRAMINE HCL 25 MG PO CAPS: 25.0000 mg | ORAL_CAPSULE | Freq: Four times a day (QID) | ORAL | Status: DC | PRN

## 2011-09-10 MED ORDER — ONDANSETRON HCL 4 MG/2ML IJ SOLN
INTRAMUSCULAR | Status: DC | PRN
  Administered 2011-09-10: 4 mg via INTRAVENOUS

## 2011-09-10 MED ORDER — NALOXONE HCL 0.4 MG/ML IJ SOLN: 0.4000 mg | INTRAMUSCULAR | Status: DC | PRN

## 2011-09-10 MED ORDER — SIMETHICONE 80 MG PO CHEW
80.0000 mg | CHEWABLE_TABLET | Freq: Three times a day (TID) | ORAL | Status: DC
  Administered 2011-09-10 – 2011-09-12 (×8): 80 mg via ORAL

## 2011-09-10 MED ORDER — DIPHENHYDRAMINE HCL 25 MG PO CAPS
25.0000 mg | ORAL_CAPSULE | ORAL | Status: DC | PRN
  Administered 2011-09-11 – 2011-09-12 (×4): 25 mg via ORAL
  Filled 2011-09-10 (×5): qty 1

## 2011-09-10 MED ORDER — LANOLIN HYDROUS EX OINT: 1.0000 "application " | TOPICAL_OINTMENT | CUTANEOUS | Status: DC | PRN

## 2011-09-10 MED ORDER — WITCH HAZEL-GLYCERIN EX PADS: 1.0000 "application " | MEDICATED_PAD | CUTANEOUS | Status: DC | PRN

## 2011-09-10 MED ORDER — MEASLES, MUMPS & RUBELLA VAC ~~LOC~~ INJ
0.5000 mL | INJECTION | Freq: Once | SUBCUTANEOUS | Status: DC
  Filled 2011-09-10: qty 0.5

## 2011-09-10 MED ORDER — SENNOSIDES-DOCUSATE SODIUM 8.6-50 MG PO TABS
2.0000 | ORAL_TABLET | Freq: Every day | ORAL | Status: DC
  Administered 2011-09-10 – 2011-09-12 (×3): 2 via ORAL

## 2011-09-10 MED ORDER — FERROUS SULFATE 325 (65 FE) MG PO TABS
325.0000 mg | ORAL_TABLET | Freq: Two times a day (BID) | ORAL | Status: DC
  Administered 2011-09-11 – 2011-09-13 (×4): 325 mg via ORAL
  Filled 2011-09-10 (×5): qty 1

## 2011-09-10 MED ORDER — SODIUM CHLORIDE 0.9 % IJ SOLN: 3.0000 mL | INTRAMUSCULAR | Status: DC | PRN

## 2011-09-10 MED ORDER — IBUPROFEN 600 MG PO TABS
600.0000 mg | ORAL_TABLET | Freq: Four times a day (QID) | ORAL | Status: DC
  Administered 2011-09-11: 600 mg via ORAL
  Filled 2011-09-10: qty 1

## 2011-09-10 MED ORDER — OXYCODONE-ACETAMINOPHEN 5-325 MG PO TABS
1.0000 | ORAL_TABLET | ORAL | Status: DC | PRN
  Administered 2011-09-12: 2 via ORAL
  Filled 2011-09-10: qty 2

## 2011-09-10 MED ORDER — ZOLPIDEM TARTRATE 5 MG PO TABS: 5.0000 mg | ORAL_TABLET | Freq: Every evening | ORAL | Status: DC | PRN

## 2011-09-10 MED ORDER — FENTANYL CITRATE 0.05 MG/ML IJ SOLN
INTRAMUSCULAR | Status: DC | PRN
  Administered 2011-09-10: 25 ug via INTRATHECAL

## 2011-09-10 MED ORDER — NALBUPHINE HCL 10 MG/ML IJ SOLN
5.0000 mg | INTRAMUSCULAR | Status: DC | PRN
  Filled 2011-09-10 (×2): qty 1

## 2011-09-10 MED ORDER — SIMETHICONE 80 MG PO CHEW: 80.0000 mg | CHEWABLE_TABLET | ORAL | Status: DC | PRN

## 2011-09-10 MED ORDER — DIPHENHYDRAMINE HCL 50 MG/ML IJ SOLN: 12.5000 mg | INTRAMUSCULAR | Status: DC | PRN

## 2011-09-10 MED ORDER — MORPHINE SULFATE (PF) 0.5 MG/ML IJ SOLN
INTRAMUSCULAR | Status: DC | PRN
  Administered 2011-09-10: .15 mg via EPIDURAL

## 2011-09-10 MED ORDER — METHYLERGONOVINE MALEATE 0.2 MG PO TABS: 0.2000 mg | ORAL_TABLET | ORAL | Status: DC | PRN

## 2011-09-10 MED ORDER — ONDANSETRON HCL 4 MG/2ML IJ SOLN
4.0000 mg | INTRAMUSCULAR | Status: DC | PRN
  Administered 2011-09-10: 4 mg via INTRAVENOUS
  Filled 2011-09-10: qty 2

## 2011-09-10 MED ORDER — MEPERIDINE HCL 25 MG/ML IJ SOLN: 6.2500 mg | INTRAMUSCULAR | Status: DC | PRN

## 2011-09-10 MED ORDER — BUPIVACAINE-EPINEPHRINE PF 0.25-1:200000 % IJ SOLN
INTRAMUSCULAR | Status: AC
  Filled 2011-09-10: qty 30

## 2011-09-10 MED ORDER — FENTANYL CITRATE 0.05 MG/ML IJ SOLN
INTRAMUSCULAR | Status: AC
  Administered 2011-09-10: 50 ug via INTRAVENOUS
  Filled 2011-09-10: qty 2

## 2011-09-10 MED ORDER — DESMOPRESSIN ACETATE 1.5 MG/ML NA SOLN
1.0000 | Freq: Every day | NASAL | Status: DC
  Administered 2011-09-11 – 2011-09-13 (×3): 0.15 mg via NASAL

## 2011-09-10 SURGICAL SUPPLY — 39 items
APL SKNCLS STERI-STRIP NONHPOA (GAUZE/BANDAGES/DRESSINGS) ×2
BENZOIN TINCTURE PRP APPL 2/3 (GAUZE/BANDAGES/DRESSINGS) ×3 IMPLANT
BOOTIES KNEE HIGH SLOAN (MISCELLANEOUS) ×6 IMPLANT
CHLORAPREP W/TINT 26ML (MISCELLANEOUS) ×3 IMPLANT
CLOTH BEACON ORANGE TIMEOUT ST (SAFETY) ×3 IMPLANT
DRAIN JACKSON PRT FLT 10 (DRAIN) IMPLANT
DRESSING TELFA 8X3 (GAUZE/BANDAGES/DRESSINGS) ×3 IMPLANT
ELECT REM PT RETURN 9FT ADLT (ELECTROSURGICAL) ×3
ELECTRODE REM PT RTRN 9FT ADLT (ELECTROSURGICAL) ×2 IMPLANT
EVACUATOR SILICONE 100CC (DRAIN) IMPLANT
EXTRACTOR VACUUM M CUP 4 TUBE (SUCTIONS) IMPLANT
GAUZE SPONGE 4X4 12PLY STRL LF (GAUZE/BANDAGES/DRESSINGS) ×6 IMPLANT
GLOVE BIOGEL PI IND STRL 7.0 (GLOVE) ×4 IMPLANT
GLOVE BIOGEL PI INDICATOR 7.0 (GLOVE) ×2
GLOVE ECLIPSE 6.5 STRL STRAW (GLOVE) ×3 IMPLANT
GOWN PREVENTION PLUS LG XLONG (DISPOSABLE) ×9 IMPLANT
KIT ABG SYR 3ML LUER SLIP (SYRINGE) IMPLANT
NDL HYPO 25X5/8 SAFETYGLIDE (NEEDLE) IMPLANT
NEEDLE HYPO 22GX1.5 SAFETY (NEEDLE) ×3 IMPLANT
NEEDLE HYPO 25X5/8 SAFETYGLIDE (NEEDLE) IMPLANT
NS IRRIG 1000ML POUR BTL (IV SOLUTION) ×6 IMPLANT
PACK C SECTION WH (CUSTOM PROCEDURE TRAY) ×3 IMPLANT
PAD ABD 7.5X8 STRL (GAUZE/BANDAGES/DRESSINGS) ×3 IMPLANT
RTRCTR C-SECT PINK 25CM LRG (MISCELLANEOUS) ×4 IMPLANT
RTRCTR C-SECT PINK 34CM XLRG (MISCELLANEOUS) ×2 IMPLANT
SLEEVE SCD COMPRESS KNEE MED (MISCELLANEOUS) IMPLANT
STRIP CLOSURE SKIN 1/2X4 (GAUZE/BANDAGES/DRESSINGS) ×3 IMPLANT
SUT CHROMIC GUT AB #0 18 (SUTURE) IMPLANT
SUT CTD VICRYL 3-0 UND BR FS-1 (SUTURE) ×2 IMPLANT
SUT MNCRL AB 3-0 PS2 27 (SUTURE) ×3 IMPLANT
SUT SILK 2 0 FSL 18 (SUTURE) IMPLANT
SUT VIC AB 0 CTX 36 (SUTURE) ×6
SUT VIC AB 0 CTX36XBRD ANBCTRL (SUTURE) ×4 IMPLANT
SUT VIC AB 1 CT1 36 (SUTURE) ×6 IMPLANT
SUT VICRYL 3 0 CT 3 (SUTURE) ×2 IMPLANT
SYR 20CC LL (SYRINGE) ×3 IMPLANT
TOWEL OR 17X24 6PK STRL BLUE (TOWEL DISPOSABLE) ×6 IMPLANT
TRAY FOLEY CATH 14FR (SET/KITS/TRAYS/PACK) ×3 IMPLANT
WATER STERILE IRR 1000ML POUR (IV SOLUTION) ×3 IMPLANT

## 2011-09-10 NOTE — Interval H&P Note (Signed)
History and Physical Interval Note:  09/10/2011 10:26 AM  Ann [name]  has presented today for surgery, with the diagnosis of Previous Cesarean Section  The various methods of treatment have been discussed with the patient and family. After consideration of risks, benefits and other options for treatment, the patient has consented to  Procedure(s) (LRB): CESAREAN SECTION WITH BILATERAL TUBAL LIGATION (Bilateral) as a surgical intervention .  The patients' history has been reviewed, patient examined, no change in status, stable for surgery.  I have reviewed the patients' chart and labs.  Questions were answered to the patient's satisfaction.     [name] A

## 2011-09-10 NOTE — Anesthesia Postprocedure Evaluation (Addendum)
  Anesthesia Post-op Note  Patient: Ann [name]  Procedure(s) Performed: Procedure(s) (LRB): CESAREAN SECTION WITH BILATERAL TUBAL LIGATION (Bilateral)  Patient Location: Mother/Baby  Anesthesia Type: spinal  Level of Consciousness: awake, alert  and oriented  Airway and Oxygen Therapy: Patient Spontanous Breathing  Post-op Pain: none  Post-op Assessment: Post-op Vital signs reviewed, Patient's Cardiovascular Status Stable, No headache, No backache, No residual numbness and No residual motor weakness  Post-op Vital Signs: Reviewed and stable  Complications: No apparent anesthesia complications

## 2011-09-10 NOTE — Transfer of Care (Signed)
Immediate Anesthesia Transfer of Care Note  Patient: Ann [name]  Procedure(s) Performed: Procedure(s) (LRB): CESAREAN SECTION WITH BILATERAL TUBAL LIGATION (Bilateral)  Patient Location: PACU  Anesthesia Type: Spinal  Level of Consciousness: awake, alert  and oriented  Airway & Oxygen Therapy: Patient Spontanous Breathing  Post-op Assessment: Report given to PACU RN and Post -op Vital signs reviewed and stable  Post vital signs: Reviewed and stable  Complications: No apparent anesthesia complications

## 2011-09-10 NOTE — Op Note (Signed)
Preoperative diagnosis: Intrauterine pregnancy at 39 weeks and 4 days, previous cesarean, desire for sterilization  Post operative diagnosis: Same  Anesthesia: Spinal  Anesthesiologist: Dr. Cristela Blue  Procedure: Repeat low transverse cesarean section with bilateral tubal ligation  Surgeon: Dr. Dois Davenport Dacota Devall  Assistant: Lavera Guise CNM  Estimated blood loss: 600 cc  Procedure:  After being informed of the planned procedure and possible complications including bleeding, infection, injury to other organs, informed consent is obtained. The patient is taken to OR #1 and given spinal anesthesia without complication. She is placed in the dorsal decubitus position with the pelvis tilted to the left. She is then prepped and draped in a sterile fashion. A Foley catheter is inserted in her bladder.  After assessing adequate level of anesthesia, we infiltrate the suprapubic area with 20 cc of Marcaine 0.25 and perform a Pfannenstiel incision which is brought down sharply to the fascia. The fascia is entered in a low transverse fashion. Linea alba is dissected.Bleeding on the left Rectus Abdominus is controlled with a 3-0 Vicryl figure-of-eight stitch. Peritoneum is entered in a midline fashion. An Alexis retractor is easily positioned. Visceral peritoneum is entered in a low transverse fashion allowing [place] to safely retract bladder by developing a bladder flap.  The myometrium is then entered in a low transverse fashion; first with knife and then extended bluntly. Amniotic fluid is clear. We assist the birth of a female  infant in vertex presentation. Mouth and nose are suctioned. The baby is delivered. The cord is clamped and sectioned. The baby is given to the neonatologist present in the room.  10 cc of blood is drawn from the umbilical vein.The placenta is allowed to deliver spontaneously. It is complete and the cord has 3 vessels. Uterine revision is negative.  We proceed with closure of the  myometrium in 2 layers: First with a running locked suture of 0 Vicryl, then with a Lembert suture of 0 Vicryl imbricating the first one. Hemostasis is completed with cauterization on peritoneal edges.  Both paracolic gutters are cleaned. Both tubes and ovaries are assessed and normal. The pelvis is profusely irrigated with warm saline to confirm a satisfactory hemostasis.  Retractors and sponges are removed. Under fascia hemostasis is completed with cauterization. The fascia is then closed with 2 running sutures of 0 Vicryl meeting midline. The wound is irrigated with warm saline and hemostasis is completed with cauterization. The skin is closed with a subcuticular suture of 3-0 Monocryl and Steri-Strips.  Instrument and sponge count is complete x2. Estimated blood loss is 600 cc.  The procedure is well tolerated by the patient who is taken to recovery room in a well and stable condition.  female baby named Fraser Din was born at 11:18 and received an Apgar of 8  at 1 minute and 9 at 5 minutes.    Specimen: Placenta sent to [facility] & [facility]   Trinten Boudoin A MD 5/3/201312:23 PM

## 2011-09-10 NOTE — H&P (View-Only) (Signed)
Ann [name] is a 31 y.o. female g3 p1 EDC 5/6 per LMP of 7/30 10 week [place] agrees with [name], presenting for repeat cesarean section and bilateral tubal ligation. Denies contractions, SROM, vag bleeding,  with +Fetal movement, No visual spots, epigastric pain, with swelling to ankles only. No HSV outbreak symptoms.  Problem list: HX cesarean section Von Willebrand disease Obesity hemorrhage Hx depression 1st trimester trichamonas Polyhydramnios Asthma HSV 2 Migraines infertity Hx pyelonephritis  Medications: PNV, valtrex 500 mg po daily, iron daily                      .Marland Kitchen...............  @IPILAPH @ OB History    Grav Para Term Preterm Abortions TAB SAB Ect Mult Living   3 1 1  1 1    1     1997 EAB 03/2003 39 weeks 3 days labor C/S   Past Medical History  Diagnosis Date  . Trichomonas   . Von Willebrand disease   . HSV-2 infection     NO RECENT OUTBREAKS  . Asthma   . Anemia   . Migraine   . Depression   . Pyelonephritis   . Spinal headache   . Abnormal Pap smear 2010    colpo  . H/O varicella   . H/O cystitis   . H/O pyelonephritis     frequently during childhood  . Alcoholic     daily   Past Surgical History  Procedure Date  . Dilation and curettage of uterus   . Back surgery     4 back surgeries  . Laparoscopy 2011  . Cesarean section 2004  . Wisdom tooth extraction 2001  D&C 1998 Hysteroscopy 2011 Family History: family history includes Arthritis in her mother; COPD in her father; Depression in her mother; Heart disease in her paternal grandfather; Kidney disease in her paternal uncle; and Stroke in her maternal grandfather and mother. Social History:  reports that she has never smoked. She has never used smokeless tobacco. She reports that she does not drink alcohol or use illicit drugs. 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 Physical exam: Calm, no distress, lungs clear bilaterally, AP RRR, abd soft, gravid, nt, bowel sounds active trace edema to lower extremities, fhts per [place] bedside 150  Prenatal labs: ABO, Rh:  O postive       Antibody:  neg Rubella:  Immune RPR: Nonreactive (09/04 0000)  HBsAg: Negative (09/04 0000)  HIV: Non-reactive (09/04 0000)  GBS:   neg 1 gtt 135, 3 gtt WNL Assessment/Plan: 39 4/7 week IUP Repeat cesarean section and BTL, preop DDAVP 0.3 mg/kg in NS 100 ml over 30 minutes pre op, post op x 5d ays Stimate 1.5 mg/ml  Nasal spray each nostril pt has brought to hospital  Collaboration with Dr. Brantley [name], [facility] 09/03/2011, 9:47 PM Ann [name], CNM

## 2011-09-10 NOTE — Anesthesia Preprocedure Evaluation (Addendum)
Anesthesia Evaluation  Patient identified by MRN, date of birth, ID band Patient awake    Reviewed: Allergy & Precautions, H&P , NPO status , Patient's Chart, lab work & pertinent test results  Airway Mallampati: III TM Distance: >3 FB     Dental  (+) Chipped,    Pulmonary asthma ,  Snores, probable OSA breath sounds clear to auscultation  Pulmonary exam normal       Cardiovascular negative cardio ROS  Rhythm:Regular Rate:Normal     Neuro/Psych  Headaches, Depression    GI/Hepatic negative GI ROS, Neg liver ROS,   Endo/Other  Morbid obesity  Renal/GU negative Renal ROS  negative genitourinary   Musculoskeletal   Abdominal (+) + obese,  Abdomen: soft.    Peds  Hematology Von Willebrand Disease Mild- No problems during pregnancy.   Anesthesia Other Findings   Reproductive/Obstetrics (+) Pregnancy                          Anesthesia Physical Anesthesia Plan  ASA: III  Anesthesia Plan: Spinal   Post-op Pain Management:    Induction:   Airway Management Planned: Natural Airway  Additional Equipment:   Intra-op Plan:   Post-operative Plan:   Informed Consent: I have reviewed the patients History and Physical, chart, labs and discussed the procedure including the risks, benefits and alternatives for the proposed anesthesia with the patient or authorized representative who has indicated his/her understanding and acceptance.   Dental advisory given  Plan Discussed with: CRNA, Anesthesiologist and Surgeon  Anesthesia Plan Comments:         Anesthesia Quick Evaluation

## 2011-09-10 NOTE — Brief Op Note (Signed)
09/10/2011  12:22 PM  PATIENT:  Ann [name]  31 y.o. female  PRE-OPERATIVE DIAGNOSIS:  Previous Cesarean Section and desire for sterilization  POST-OPERATIVE DIAGNOSIS:  same    Procedure(s): CESAREAN SECTION WITH BILATERAL TUBAL LIGATION    Surgeon(s): Esmeralda Arthur, MD   ASSISTANTS: Lavera Guise CNM   ANESTHESIA:   spinal  LOCAL MEDICATIONS USED:  MARCAINE     SPECIMEN:  Placenta  DISPOSITION OF SPECIMEN:  L & D  COUNTS:  YES  ESTIMATED BLOOD LOSS: 600 cc  PATIENT DISPOSITION:  PACU - hemodynamically stable.      [id number] A MD 09/10/2011 12:22 PM

## 2011-09-10 NOTE — Anesthesia Procedure Notes (Signed)
Spinal  Patient location during procedure: OR Preanesthetic Checklist Completed: patient identified, site marked, surgical consent, pre-op evaluation, timeout performed, IV checked, risks and benefits discussed and monitors and equipment checked Spinal Block Patient position: sitting Prep: DuraPrep Patient monitoring: cardiac monitor, continuous pulse ox, blood pressure and heart rate Approach: midline Location: L3-4 Injection technique: catheter Needle Needle type: Tuohy and Sprotte  Needle gauge: 24 G Needle length: 12.7 cm Needle insertion depth: 7 cm Catheter type: closed end flexible Catheter size: 19 g Additional Notes Attempted 90mm sprotte No Catheter placed  Spinal Dosage in OR  Bupivicaine ml       1.4 PFMS04   mcg        150 Fentanyl mcg            25

## 2011-09-11 LAB — CBC
Platelets: 193 10*3/uL (ref 150–400)
RDW: 15 % (ref 11.5–15.5)
WBC: 8.6 10*3/uL (ref 4.0–10.5)

## 2011-09-11 MED ORDER — HYDROMORPHONE HCL 2 MG PO TABS
2.0000 mg | ORAL_TABLET | ORAL | Status: DC | PRN
  Administered 2011-09-11 – 2011-09-12 (×5): 2 mg via ORAL
  Filled 2011-09-11 (×5): qty 1

## 2011-09-11 MED ORDER — ACETAMINOPHEN 325 MG PO TABS
650.0000 mg | ORAL_TABLET | Freq: Four times a day (QID) | ORAL | Status: DC
  Administered 2011-09-11 – 2011-09-12 (×2): 650 mg via ORAL
  Filled 2011-09-11 (×3): qty 2

## 2011-09-11 NOTE — Progress Notes (Addendum)
Subjective: Postpartum Day 1: Cesarean Delivery repeat with BTL. Patient reports no dizziness or syncope--currently standing in bathroom.  PCA currently in place. Bottlefeeding.  Objective: Vital signs in last 24 hours: Temp:  [97.8 F (36.6 C)-98.8 F (37.1 C)] 98.8 F (37.1 C) (05/04 1400) Pulse Rate:  [74-102] 85  (05/04 1400) Resp:  [16-22] 18  (05/04 1400) BP: (111-149)/(73-93) 111/73 mmHg (05/04 1400) SpO2:  [95 %-98 %] 97 % (05/04 1400)  Physical Exam:  General: alert Lochia: appropriate Uterine Fundus: firm Incision: Dressing CDI DVT Evaluation: No evidence of DVT seen on physical exam. Negative Homan's sign.   Basename 09/11/11 0549  HGB 8.4*  HCT 26.1*    Assessment/Plan: Status post Cesarean section. Doing well postoperatively.   Von Willibrand's disease. Anemia, stable hemodynamically.  Plan: Continue current care. Will D/C PCA--patient to start her Ibuprophen/Toradol until 11 am per pharmacy due to Von Willibrand's status. Check orthostatic BP and pulses On Fe. Recheck CBC in am  Nigel Bridgeman 09/11/2011, 5:23 PM   Reviewed pain management with patient in regards to Von Willebrand's disease. Will modify orders to D/C Toradol and Ibuprofen. Will use Tylenol and PO Dilaudid

## 2011-09-12 LAB — CBC
Hemoglobin: 8.5 g/dL — ABNORMAL LOW (ref 12.0–15.0)
RBC: 2.81 MIL/uL — ABNORMAL LOW (ref 3.87–5.11)
WBC: 8.7 10*3/uL (ref 4.0–10.5)

## 2011-09-12 MED ORDER — POLYETHYLENE GLYCOL 3350 17 G PO PACK
17.0000 g | PACK | Freq: Two times a day (BID) | ORAL | Status: DC | PRN
  Administered 2011-09-12: 17 g via ORAL
  Filled 2011-09-12: qty 1

## 2011-09-12 MED ORDER — MAGNESIUM HYDROXIDE 400 MG/5ML PO SUSP
30.0000 mL | Freq: Every day | ORAL | Status: DC | PRN
  Administered 2011-09-12: 30 mL via ORAL
  Filled 2011-09-12: qty 30

## 2011-09-12 MED ORDER — HYDROCODONE-ACETAMINOPHEN 5-325 MG PO TABS
1.0000 | ORAL_TABLET | ORAL | Status: DC | PRN
  Administered 2011-09-12 (×3): 2 via ORAL
  Administered 2011-09-13: 1 via ORAL
  Filled 2011-09-12: qty 2
  Filled 2011-09-12: qty 1
  Filled 2011-09-12 (×2): qty 2

## 2011-09-12 MED ORDER — BISACODYL 10 MG RE SUPP
10.0000 mg | Freq: Every day | RECTAL | Status: DC | PRN
  Administered 2011-09-12: 10 mg via RECTAL
  Filled 2011-09-12: qty 1

## 2011-09-12 MED ORDER — ACETAMINOPHEN 500 MG PO TABS
1000.0000 mg | ORAL_TABLET | Freq: Four times a day (QID) | ORAL | Status: DC | PRN
  Administered 2011-09-13: 1000 mg via ORAL
  Administered 2011-09-13: 500 mg via ORAL
  Filled 2011-09-12 (×3): qty 2

## 2011-09-12 NOTE — Progress Notes (Signed)
Subjective: Postpartum Day 2: Cesarean Delivery, repeat with BTL Patient reports no problems voiding.  Up ad lib, no syncope or dizziness. No flatus yet, feels if gas was relieved, her pain would be better controlled.  Reports Dilaudid minimally effective.  Unable to take Motrin/Toradol due to Von Willibrand's disease.  Has taken Vicodin in past with benefit.     Objective: Vital signs in last 24 hours: Temp:  [98.4 F (36.9 C)-98.9 F (37.2 C)] 98.9 F (37.2 C) (05/05 0625) Pulse Rate:  [85-106] 98  (05/05 0625) Resp:  [16-20] 18  (05/05 0625) BP: (111-139)/(66-84) 124/82 mmHg (05/05 0625) SpO2:  [97 %-99 %] 98 % (05/04 1732)  Orthostatics stable.  Physical Exam:  General: alert Lochia: appropriate Uterine Fundus: firm Incision: healing well DVT Evaluation: No evidence of DVT seen on physical exam. Negative Homan's sign. Calf/Ankle edema is present. Abdomen:  Gaseous distension present, bowel sounds noted.   Basename 09/12/11 0531 09/11/11 0549  HGB 8.5* 8.4*  HCT 26.1* 26.1*    Assessment/Plan: Status post Cesarean section.  Anemia without hemodynamic instability. Gaseous distension Pain issues  Plan: Will try Vicodin for pain Dulcolax suppository for gas Heating pad to abdomen Anticipate d/c tomorrow.  Dorissa Stinnette 09/12/2011, 9:22 AM

## 2011-09-13 MED ORDER — DESMOPRESSIN ACETATE 1.5 MG/ML NA SOLN: 1.0000 | Freq: Every day | NASAL | Status: AC

## 2011-09-13 MED ORDER — OXYCODONE-ACETAMINOPHEN 5-325 MG PO TABS: 1.0000 | ORAL_TABLET | ORAL | Status: AC | PRN

## 2011-09-13 NOTE — Discharge Instructions (Signed)

## 2011-09-17 ENCOUNTER — Telehealth: Payer: Self-pay

## 2011-09-17 NOTE — Telephone Encounter (Signed)
Tc from [facility]). BP readings from yesterday for pt 148-146/88-90. C/o ha's x 2days. Edema +1. No dizziness or visual changes. Pt c/o,"possibility of post partum depression starting". H/o pp depression with prev preg. No SI or HI. Consulted with vl, pt to come to office on 09-20-11 for bp check and eval of ? pp depression. Told pt to cont inc water intake, elevate legs, and cont Percocet/Tylenol prn. Appt sched 09-20-11 @2 :45p with avs. Pt to call over weekend if sx's worsen. Pt agrees.

## 2011-09-17 NOTE — Discharge Summary (Signed)
   Obstetric Discharge Summary Reason for Admission: cesarean section and bilateral tubal ligation Prenatal Procedures: NST and ultrasound Intrapartum Procedures: cesarean: low cervical, transverse, tubal ligation and spinal Postpartum Procedures: none Complications-Operative and Postpartum: none    Hemoglobin  Date Value Range Status  09/12/2011 8.5* 12.0-15.0 (g/dL) Final     HCT  Date Value Range Status  09/12/2011 26.1* 36.0-46.0 (%) Final    Hospital Course:  Hospital Course: Admitted for scheduled repeat LTCS with BTL.  Delivery was performed by Dr Estanislado Pandy,  without difficulty. Patient and baby tolerated the procedure without difficulty. Infant to FTN. Mother and infant then had an uncomplicated postpartum course, with breast feeding going well. Mom's physical exam was WNL, and she was discharged home in stable condition. Contraception plan was BTL.  She received adequate benefit from po pain medications.   Discharge Diagnoses: Term Pregnancy-delivered and anemia - stable  Discharge Information: Date: 09/17/2011 Activity: pelvic rest Diet: routine and iron - rich foods Medications:  Medication List  As of 09/17/2011  7:18 PM   START taking these medications         oxyCODONE-acetaminophen 5-325 MG per tablet   Commonly known as: PERCOCET   Take 1-2 tablets by mouth every 3 (three) hours as needed (moderate - severe pain).         CONTINUE taking these medications         acetaminophen 325 MG tablet   Commonly known as: TYLENOL      ferrous sulfate 325 (65 FE) MG tablet      PRENATAL VITAMINS PO         STOP taking these medications         cyclobenzaprine 10 MG tablet      ondansetron 8 MG disintegrating tablet      valACYclovir 500 MG tablet          Where to get your medications    These are the prescriptions that you need to pick up.   You may get these medications from any pharmacy.         oxyCODONE-acetaminophen 5-325 MG per tablet            Pt to continue Stimate 1.5 mg/ml Nasal spray u ntil 09-14-11, for a total of 5 days post op   Condition: stable Instructions: refer to practice specific booklet Discharge to: home Follow-up Information    Follow up with ccob in 6 weeks. (As needed if symptoms worsen)          Newborn Data: Live born  Information for the patient's newborn:  Lorana, Maffeo [[id number]]  female ; APGAR 8, 9  ; weight 7#8oz Home with mother.  Oluwadarasimi Favor M 09/17/2011, 7:18 PM

## 2011-09-20 ENCOUNTER — Encounter: Payer: Self-pay | Admitting: Obstetrics and Gynecology

## 2011-09-20 ENCOUNTER — Ambulatory Visit (INDEPENDENT_AMBULATORY_CARE_PROVIDER_SITE_OTHER): Payer: 59 | Admitting: Obstetrics and Gynecology

## 2011-09-20 VITALS — BP 144/82 | Ht 63.0 in | Wt 256.0 lb

## 2011-09-20 DIAGNOSIS — IMO0002 Reserved for concepts with insufficient information to code with codable children: Secondary | ICD-10-CM

## 2011-09-20 DIAGNOSIS — O139 Gestational [pregnancy-induced] hypertension without significant proteinuria, unspecified trimester: Secondary | ICD-10-CM | POA: Insufficient documentation

## 2011-09-20 LAB — POCT URINALYSIS DIPSTICK
Bilirubin, UA: NEGATIVE
Spec Grav, UA: 1.02

## 2011-09-20 NOTE — Progress Notes (Signed)
Ann [name]  is 10 day postpartum following a primary cesarean section, low transverse incision at 39 gestational weeks Date: 09/10/2011 female baby named Fraser Din delivered by [facility].  Breastfeeding: no Bottlefeeding:  yes  Post-partum blues / depression:  yes  EPDS pt states that she was having trouble the first week but seems to be fine now History of abnormal Pap:  yes  Last Pap: Date  2011 Gestational diabetes:  no  Contraception:  Pt had bilateral tubal ligation  Normal urinary function:  yes Normal GI function:  yes Returning to work: yes   Ms. Ivanell Ann [name] is a 31 y.o. year old female,G3P2012, who presents for a problem visit. She had a cesarean section and tubal ligation on Sep 10, 2011.  She was seen by a nurse and her blood pressure was elevated.  The nurse was concerned about postpartum depression. The patient has a history of migraine headaches.  Subjective:  The patient reports that she feels better today.  She is not depressed.  She denies blurred vision and right upper quadrant tenderness.  She does complain of a mild headache.  Objective:  BP 144/82  Ht 5\' 3"  (1.6 m)  Wt 256 lb (116.121 kg)  BMI 45.35 kg/m2  LMP 12/07/2010  Breastfeeding? No   General: alert, cooperative and no distress Resp: clear to auscultation bilaterally Cardio: regular rate and rhythm, S1, S2 normal, no murmur, click, rub or gallop GI: soft, non-tender; bowel sounds normal; no masses,  no organomegaly and incision is healing.  There is a small opening to the right of the midline but no signs of infection.  Exam deferred.  Reflexes are normal.  No clonus.  No masses.  One plus edema.  UA: Trace protein, otherwise normal  Assessment:  Elevated blood pressure that may be secondary to fluid shifts after delivery.  No sign of preeclampsia. No signs of postpartum depression.  Plan:  Return in 4 weeks for routine visit.  Call for questions or concerns.  Return to office prn if  symptoms worsen or fail to improve.   Leonard Schwartz M.D.  09/20/2011 6:25 PM

## 2011-09-28 ENCOUNTER — Ambulatory Visit (INDEPENDENT_AMBULATORY_CARE_PROVIDER_SITE_OTHER): Payer: 59 | Admitting: Obstetrics and Gynecology

## 2011-09-28 ENCOUNTER — Encounter: Payer: Self-pay | Admitting: Obstetrics and Gynecology

## 2011-09-28 ENCOUNTER — Telehealth: Payer: Self-pay

## 2011-09-28 DIAGNOSIS — D680 Von Willebrand's disease: Secondary | ICD-10-CM

## 2011-09-28 NOTE — Telephone Encounter (Signed)
Pt called and stated that her incision has opened up over the weekend. Pt had C-section 09/10/2011. Incision has opened in 2 areas on the right side. Incision oozing blood and has throbbing pain at site. Pt was given an appt @ 3:10 to see Dr. Estanislado Pandy. Mathis Bud

## 2011-09-28 NOTE — Progress Notes (Signed)
Surgery: C Sect  Date: 09/10/2011  Eating a regular diet without difficulty. Bowel movements are normal but very loose Pain is controlled without any medications.  Bladder function  returned to normal. Vaginal bleeding: none Vaginal discharge: no vaginal discharge    Pt concerned about appearance of incision. Not using pain meds. Pain 2-3/10. Bleeding has increased but still reasonnable with VW  O: incision normal  A/P: reviewed post-op instructions. Pt to take Ibuprofen around the clock. Needs to improve diet. Reassurance.        Follow-up in 4 weeks for PP

## 2011-10-19 ENCOUNTER — Ambulatory Visit (INDEPENDENT_AMBULATORY_CARE_PROVIDER_SITE_OTHER): Payer: 59 | Admitting: Obstetrics and Gynecology

## 2011-10-19 ENCOUNTER — Encounter: Payer: Self-pay | Admitting: Obstetrics and Gynecology

## 2011-10-19 DIAGNOSIS — O99345 Other mental disorders complicating the puerperium: Secondary | ICD-10-CM

## 2011-10-19 DIAGNOSIS — IMO0002 Reserved for concepts with insufficient information to code with codable children: Secondary | ICD-10-CM

## 2011-10-19 MED ORDER — SERTRALINE HCL 50 MG PO TABS: 50.0000 mg | ORAL_TABLET | Freq: Every day | ORAL | Status: DC

## 2011-10-19 NOTE — Progress Notes (Signed)
Ann [name]  is 5 weeks postpartum following a primary cesarean section, low transverse incision at 39 gestational weeks Date: 09/10/2011 female baby named Fraser Din delivered by Dr. Estanislado Pandy   Breastfeeding: no Bottlefeeding:  yes  Post-partum blues / depression:  Unsure   EPDS score: 20 Depression symptoms: anxiety, crying spells, guilt, sadness and overwhelmed Homicidal thoughts: no Suicidal thoughts: no   History of abnormal Pap:  yes  Last Pap: Date  06/2010 normal Gestational diabetes:  no  Contraception: bilateral tubal ligation  Normal urinary function:  yes Normal GI function:  yes Returning to work:  Yes  Subjective:     Ann [name] is a 31 y.o. female who presents for a postpartum visit.  I have fully reviewed the prenatal and intrapartum course.    Patient is sexually active.   The following portions of the patient's history were reviewed and updated as appropriate: allergies, current medications, past family history, past medical history, past social history, past surgical history and problem list.  Review of Systems Pertinent items are noted in HPI.   Objective:    BP 116/74  Wt 256 lb (116.121 kg)  Breastfeeding? No  General:  alert, cooperative and no distress     Lungs: clear to auscultation bilaterally  Heart:  regular rate and rhythm, S1, S2 normal, no murmur  Abdomen: soft, non-tender; bowel sounds normal; no masses,  no organomegaly   Vulva:  normal  Vagina: normal vagina  Cervix:  normal  Corpus: normal size, contour, position, consistency, mobility, non-tender  Adnexa:  normal adnexa             Assessment:     Normal postpartum exam.  Post-partum depression Pap smear done at today's visit.   Plan:   Suggest starting antidepressants. Reviewed mechanism of action, expected benefits, time to benefits, possible side effects and possible need for trial and error. Also reviewed treatment duration of 6-12 months before considering weaning  off medication. Referral to psychiatrist offered but declined at this time. Patient instructed to call office if symptoms worsen or suicidal / homicidal thoughts occur.Patient agreeable. Follow-up 3 weeks     Chantal Worthey A MD 10/19/2011 9:43 AM

## 2011-10-20 LAB — PAP IG W/ RFLX HPV ASCU

## 2011-10-21 ENCOUNTER — Encounter: Payer: Self-pay | Admitting: Obstetrics and Gynecology

## 2011-11-10 ENCOUNTER — Encounter: Payer: 59 | Admitting: Obstetrics and Gynecology

## 2012-01-15 ENCOUNTER — Emergency Department (HOSPITAL_COMMUNITY)
Admission: EM | Admit: 2012-01-15 | Discharge: 2012-01-16 | Disposition: A | Discharge status: Home / Self Care | Payer: 59 | Attending: Emergency Medicine | Admitting: Emergency Medicine

## 2012-01-15 ENCOUNTER — Encounter (HOSPITAL_COMMUNITY): Payer: Self-pay | Admitting: Adult Health

## 2012-01-15 DIAGNOSIS — R51 Headache: Secondary | ICD-10-CM | POA: Insufficient documentation

## 2012-01-15 DIAGNOSIS — H0289 Other specified disorders of eyelid: Secondary | ICD-10-CM

## 2012-01-15 DIAGNOSIS — D6800 Von Willebrand disease, unspecified: Secondary | ICD-10-CM | POA: Insufficient documentation

## 2012-01-15 DIAGNOSIS — H579 Unspecified disorder of eye and adnexa: Secondary | ICD-10-CM | POA: Insufficient documentation

## 2012-01-15 DIAGNOSIS — H5710 Ocular pain, unspecified eye: Secondary | ICD-10-CM | POA: Insufficient documentation

## 2012-01-15 DIAGNOSIS — H43399 Other vitreous opacities, unspecified eye: Secondary | ICD-10-CM

## 2012-01-15 DIAGNOSIS — J45909 Unspecified asthma, uncomplicated: Secondary | ICD-10-CM | POA: Insufficient documentation

## 2012-01-15 MED ORDER — PROPARACAINE HCL 0.5 % OP SOLN
1.0000 [drp] | Freq: Once | OPHTHALMIC | Status: AC
  Administered 2012-01-15: 1 [drp] via OPHTHALMIC
  Filled 2012-01-15: qty 15

## 2012-01-15 NOTE — ED Provider Notes (Signed)
History     CSN: [id number]  Arrival date & time 01/15/12  2239   First MD Initiated Contact with Patient 01/15/12 2334      Chief Complaint  Patient presents with  . Pressure Behind the Eyes    (Consider location/radiation/quality/duration/timing/severity/associated sxs/prior treatment) HPI Comments: He reports, right eye pain, and pressure with decreased visual acuity.  She lacks spots, and shadows, with headache, and nausea.  She states his heart was started happening.  One day ago.  She is 5 months post C-section, where she did have some hypertension towards the end of her pregnancy, but no eclampsia or preeclampsia.  The history is provided by the patient.    Past Medical History  Diagnosis Date  . Trichomonas   . Von Willebrand disease   . HSV-2 infection     NO RECENT OUTBREAKS  . Anemia   . Migraine   . Depression   . Spinal headache   . Abnormal Pap smear 2010    colpo  . H/O varicella   . H/O cystitis   . H/O pyelonephritis     frequently during childhood  . Alcoholic     daily  . Asthma     unable to find inhaler  . Pyelonephritis     last episode "years ago"  . Blood dyscrasia     last seen by Dr Cyndie Chime 9yrs ago    Past Surgical History  Procedure Date  . Back surgery     4 back surgeries  . Laparoscopy 2011  . Wisdom tooth extraction 2001  . Dilation and curettage of uterus   . Cesarean section 2004  2013    2 layer closure    Family History  Problem Relation Age of Onset  . Stroke Mother   . Arthritis Mother   . Depression Mother   . COPD Father   . Kidney disease Paternal Uncle   . Stroke Maternal Grandfather   . Heart disease Paternal Grandfather     History  Substance Use Topics  . Smoking status: Former Games developer  . Smokeless tobacco: Never Used  . Alcohol Use: No    OB History    Grav Para Term Preterm Abortions TAB SAB Ect Mult Living   3 2 2  1 1    2       Review of Systems  Constitutional: Negative for fever.    HENT: Negative for neck pain.   Eyes: Positive for pain and visual disturbance. Negative for photophobia, discharge and redness.  Respiratory: Negative for shortness of breath.   Cardiovascular: Negative for leg swelling.  Gastrointestinal: Positive for nausea.  Skin: Negative for rash and wound.  Neurological: Positive for headaches. Negative for dizziness.    Allergies  Darvocet; Codeine; and Sulfa antibiotics  Home Medications   Current Outpatient Rx  Name Route Sig Dispense Refill  . BC HEADACHE POWDER PO Oral Take 1 packet by mouth daily as needed. For pain    . IBUPROFEN 200 MG PO TABS Oral Take 600-800 mg by mouth every 6 (six) hours as needed. For pain      BP 140/79  Pulse 85  Temp 98.3 F (36.8 C) (Oral)  Resp 16  SpO2 98%  Breastfeeding? No  Physical Exam  Constitutional: She appears well-developed and well-nourished.       Morbidly obese  HENT:  Head: Normocephalic and atraumatic.  Right Ear: External ear normal.  Left Ear: External ear normal.  Eyes: Right eye exhibits no chemosis  and no discharge. Left eye exhibits no chemosis, no discharge, no exudate and no hordeolum. Right conjunctiva is not injected. Left conjunctiva is not injected. Left conjunctiva has no hemorrhage. No scleral icterus. Right eye exhibits normal extraocular motion and no nystagmus. Left eye exhibits normal extraocular motion. Right pupil is round and reactive. Left pupil is round and reactive. Pupils are unequal.       Left eye.  Pupil 6 mm, does react to light pressure 51,51,91 Right eye pupil 3 mm slower to react to light pressure 10,13,12    ED Course  Procedures (including critical care time)  Labs Reviewed - No data to display No results found.   No diagnosis found.    MDM   Unequal pupils right eye pain with visual disturbances.  Will CT head.  Patient also has quite elevated eye pressure in the left eye with an average of 84 pressure in the right eye.  An average of  12  Had a long discussion with Dr. Allyne Gee, ophthalmology.  He recommends an eyedrop for the increased pressure in her left eye and office followup at 1 PM tomorrow.        Arman Filter, NP 01/16/12 0145

## 2012-01-15 NOTE — ED Notes (Signed)
C/o right eye pressure and inability to see well with right eye. She states, "i can see black spots and shadows out of my right eye and its causing me to have a headache and feel nauseated"  Neurologically intact and answering questions appropriately.

## 2012-01-16 ENCOUNTER — Emergency Department (HOSPITAL_COMMUNITY): Payer: 59

## 2012-01-16 MED ORDER — DORZOLAMIDE HCL-TIMOLOL MAL 2-0.5 % OP SOLN
2.0000 [drp] | Freq: Two times a day (BID) | OPHTHALMIC | Status: DC
  Administered 2012-01-16: 2 [drp] via OPHTHALMIC
  Filled 2012-01-16: qty 10

## 2012-01-16 NOTE — ED Provider Notes (Signed)
Medical screening examination/treatment/procedure(s) were performed by non-physician practitioner and as supervising physician I was immediately available for consultation/collaboration.  Declyn Offield L Jaycen Vercher, MD 01/16/12 1830 

## 2012-02-21 ENCOUNTER — Telehealth: Payer: Self-pay | Admitting: *Deleted

## 2012-02-21 NOTE — Telephone Encounter (Signed)
Received message from [facility] from Dr. Lynnell Dike office requesting Dr. Patsy Lager recommendation for DDAVP for pt that will be having surgery 03/07/12.  Call back # [phone], Fax # [phone].  Note to Dr. Cyndie Chime.

## 2012-02-25 ENCOUNTER — Encounter (HOSPITAL_COMMUNITY): Payer: Self-pay | Admitting: Emergency Medicine

## 2012-02-25 ENCOUNTER — Emergency Department (HOSPITAL_COMMUNITY)
Admission: EM | Admit: 2012-02-25 | Discharge: 2012-02-26 | Disposition: A | Discharge status: Home / Self Care | Payer: 59 | Attending: Emergency Medicine | Admitting: Emergency Medicine

## 2012-02-25 ENCOUNTER — Emergency Department (HOSPITAL_COMMUNITY): Payer: 59

## 2012-02-25 ENCOUNTER — Encounter (HOSPITAL_COMMUNITY): Payer: Self-pay

## 2012-02-25 ENCOUNTER — Other Ambulatory Visit: Payer: Self-pay

## 2012-02-25 DIAGNOSIS — R1110 Vomiting, unspecified: Secondary | ICD-10-CM

## 2012-02-25 DIAGNOSIS — R0602 Shortness of breath: Secondary | ICD-10-CM | POA: Insufficient documentation

## 2012-02-25 DIAGNOSIS — R109 Unspecified abdominal pain: Secondary | ICD-10-CM | POA: Insufficient documentation

## 2012-02-25 DIAGNOSIS — J45909 Unspecified asthma, uncomplicated: Secondary | ICD-10-CM | POA: Insufficient documentation

## 2012-02-25 DIAGNOSIS — R0789 Other chest pain: Secondary | ICD-10-CM

## 2012-02-25 DIAGNOSIS — R112 Nausea with vomiting, unspecified: Secondary | ICD-10-CM | POA: Insufficient documentation

## 2012-02-25 DIAGNOSIS — R071 Chest pain on breathing: Secondary | ICD-10-CM | POA: Insufficient documentation

## 2012-02-25 DIAGNOSIS — Z79899 Other long term (current) drug therapy: Secondary | ICD-10-CM | POA: Insufficient documentation

## 2012-02-25 LAB — PRO B NATRIURETIC PEPTIDE: Pro B Natriuretic peptide (BNP): 93.1 pg/mL (ref 0–125)

## 2012-02-25 LAB — CBC
MCV: 88.7 fL (ref 78.0–100.0)
Platelets: 354 10*3/uL (ref 150–400)
RBC: 3.9 MIL/uL (ref 3.87–5.11)
RDW: 13.9 % (ref 11.5–15.5)
WBC: 12 10*3/uL — ABNORMAL HIGH (ref 4.0–10.5)

## 2012-02-25 LAB — BASIC METABOLIC PANEL
CO2: 22 mEq/L (ref 19–32)
Chloride: 103 mEq/L (ref 96–112)
GFR calc Af Amer: >90 mL/min (ref >90)
Potassium: 4.1 mEq/L (ref 3.5–5.1)
Sodium: 137 mEq/L (ref 135–145)

## 2012-02-25 MED ORDER — METOCLOPRAMIDE HCL 5 MG/ML IJ SOLN
10.0000 mg | Freq: Once | INTRAMUSCULAR | Status: AC
  Administered 2012-02-25: 10 mg via INTRAVENOUS
  Filled 2012-02-25: qty 2

## 2012-02-25 MED ORDER — GI COCKTAIL ~~LOC~~
30.0000 mL | Freq: Once | ORAL | Status: AC
  Administered 2012-02-25: 30 mL via ORAL
  Filled 2012-02-25: qty 30

## 2012-02-25 MED ORDER — NITROGLYCERIN 0.4 MG SL SUBL
0.4000 mg | SUBLINGUAL_TABLET | SUBLINGUAL | Status: DC | PRN
  Administered 2012-02-25 (×3): 0.4 mg via SUBLINGUAL

## 2012-02-25 MED ORDER — ONDANSETRON HCL 4 MG/2ML IJ SOLN
INTRAMUSCULAR | Status: AC
  Administered 2012-02-25: 4 mg via INTRAVENOUS
  Filled 2012-02-25: qty 2

## 2012-02-25 MED ORDER — ONDANSETRON HCL 4 MG/2ML IJ SOLN
4.0000 mg | Freq: Once | INTRAMUSCULAR | Status: AC
  Administered 2012-02-25: 4 mg via INTRAVENOUS

## 2012-02-25 NOTE — ED Notes (Signed)
Per EMS, pt had oral surgery this am-had anesthesia- about 30 minutes ago started CP, and under L breast radiating to L shoulder; vomiting, denies SOB; received 1 nitro and 324 of asa; freq pacs on 12 lead; no cardiac hx, but does have clotting disorder

## 2012-02-25 NOTE — ED Notes (Signed)
EKG done by [facility]ock

## 2012-02-25 NOTE — ED Notes (Signed)
Per pt, about 30 min ago started to have cp radiating to shoulder and back; c/o SOB, vomiting, diaphoresis

## 2012-02-25 NOTE — ED Provider Notes (Signed)
History     CSN: [id number]  Arrival date & time 02/25/12  2137   First MD Initiated Contact with Patient 02/25/12 2240      Chief Complaint  Patient presents with  . Chest Pain    (Consider location/radiation/quality/duration/timing/severity/associated sxs/prior treatment) Patient is a 31 y.o. female presenting with chest pain. The history is provided by the patient.  Chest Pain The chest pain began 1 - 2 hours ago. Chest pain occurs constantly. The chest pain is improving. The pain is associated with eating. At its most intense, the pain is at 7/10. The pain is currently at 7/10. The severity of the pain is moderate. The pain radiates to the upper back. Chest pain is worsened by eating. Primary symptoms include shortness of breath, abdominal pain, nausea and vomiting. Pertinent negatives for primary symptoms include no fever, no fatigue, no syncope, no cough, no wheezing, no palpitations, no dizziness and no altered mental status.  Associated symptoms include diaphoresis. She tried nitroglycerin and aspirin (zofran) for the symptoms. Risk factors include lack of exercise. Past medical history comments: Von Willebrand's disease     Past Medical History  Diagnosis Date  . Trichomonas   . Von Willebrand disease   . HSV-2 infection     NO RECENT OUTBREAKS  . Anemia   . Migraine   . Depression   . Spinal headache   . Abnormal Pap smear 2010    colpo  . H/O varicella   . H/O cystitis   . H/O pyelonephritis     frequently during childhood  . Alcoholic     daily  . Asthma     unable to find inhaler  . Pyelonephritis     last episode "years ago"  . Blood dyscrasia     last seen by Dr Cyndie Chime 24yrs ago    Past Surgical History  Procedure Date  . Back surgery     4 back surgeries  . Laparoscopy 2011  . Wisdom tooth extraction 2001  . Dilation and curettage of uterus   . Cesarean section 2004  2013    2 layer closure    Family History  Problem Relation Age of  Onset  . Stroke Mother   . Arthritis Mother   . Depression Mother   . COPD Father   . Kidney disease Paternal Uncle   . Stroke Maternal Grandfather   . Heart disease Paternal Grandfather     History  Substance Use Topics  . Smoking status: Former Games developer  . Smokeless tobacco: Never Used  . Alcohol Use: No    OB History    Grav Para Term Preterm Abortions TAB SAB Ect Mult Living   3 2 2  1 1    2       Review of Systems  Constitutional: Positive for diaphoresis. Negative for fever and fatigue.  Respiratory: Positive for shortness of breath. Negative for cough and wheezing.   Cardiovascular: Positive for chest pain. Negative for palpitations and syncope.  Gastrointestinal: Positive for nausea, vomiting and abdominal pain. Negative for diarrhea.  Neurological: Negative for dizziness and headaches.  Psychiatric/Behavioral: Negative for altered mental status.  All other systems reviewed and are negative.    Allergies  Darvocet; Codeine; and Sulfa antibiotics  Home Medications   Current Outpatient Rx  Name Route Sig Dispense Refill  . BC HEADACHE POWDER PO Oral Take 1 packet by mouth daily as needed. For pain    . ATROPINE SULFATE 1 % OP SOLN Right Eye  Place 1 drop into the right eye daily. Floaters in vision and blurred vision    . DESMOPRESSIN ACETATE 1.5 MG/ML NA SOLN Nasal Place 1 spray into the nose as needed. Before minor surgery    . HYDROCODONE-ACETAMINOPHEN 5-500 MG PO TABS Oral Take 1-2 tablets by mouth every 4 (four) hours as needed. Dental surgery on 10/18    . IBUPROFEN 200 MG PO TABS Oral Take 600-800 mg by mouth every 6 (six) hours as needed. For pain    . PREDNISOLONE ACETATE 1 % OP SUSP Right Eye Place 1 drop into the right eye 4 (four) times daily. Floater and blurred vision in right eye      BP 114/70  Pulse 87  Temp 99.1 F (37.3 C) (Oral)  Resp 17  SpO2 97%  LMP 01/28/2012  Physical Exam  Nursing note and vitals reviewed. Constitutional: She is  oriented to person, place, and time. She appears well-developed and well-nourished. No distress.  HENT:  Head: Normocephalic and atraumatic.  Eyes: EOM are normal. Pupils are equal, round, and reactive to light.  Neck: Normal range of motion.  Cardiovascular: Normal rate and normal heart sounds.   Pulmonary/Chest: Effort normal and breath sounds normal. No respiratory distress.  Abdominal: Soft. She exhibits no distension. There is tenderness in the epigastric area. There is no rigidity, no rebound, no guarding and no CVA tenderness.    Musculoskeletal: Normal range of motion.  Neurological: She is alert and oriented to person, place, and time.  Skin: Skin is warm and dry.    ED Course  Procedures (including critical care time)  Labs Reviewed  CBC - Abnormal; Notable for the following:    WBC 12.0 (*)     Hemoglobin 11.2 (*)     HCT 34.6 (*)     All other components within normal limits  BASIC METABOLIC PANEL - Abnormal; Notable for the following:    Glucose, Bld 129 (*)     All other components within normal limits  PRO B NATRIURETIC PEPTIDE   Dg Chest 2 View  02/25/2012  *RADIOLOGY REPORT*  Clinical Data: Chest pain.  CHEST - 2 VIEW  Comparison: April 19, 2007.  Findings: Cardiomediastinal silhouette appears normal.  No acute pulmonary disease is noted.  Bony thorax is intact.  IMPRESSION: No acute cardiopulmonary abnormality seen.   Original Report Authenticated By: Venita Sheffield., M.D.     Date: 02/25/2012  Rate: 92  Rhythm: normal sinus rhythm  QRS Axis: normal  Intervals: normal  ST/T Wave abnormalities: normal  Conduction Disutrbances:none  Narrative Interpretation: NSR  Old EKG Reviewed: unchanged    No diagnosis found.    MDM  11:09 PM Pt seen and examined. Pt with oral surgery earlier today now with epigastric chest pain and vomiting. Pt likely having post-surgery discomfort as symptoms started shortly after eating. Doubt ACS as NTG not helping pain  and unremarkable EKG. Pt was sedated for procedure, but no evidence of injury on CXR. Will treat symptoms.  12:46 AM Pt feeling improved. Will discharge.      Daleen Bo, MD 02/26/12 [phone]

## 2012-02-26 MED ORDER — LORAZEPAM 2 MG/ML IJ SOLN
1.0000 mg | Freq: Once | INTRAMUSCULAR | Status: AC
  Administered 2012-02-26: 1 mg via INTRAVENOUS
  Filled 2012-02-26: qty 1

## 2012-02-26 MED ORDER — HYDROMORPHONE HCL PF 1 MG/ML IJ SOLN
1.0000 mg | Freq: Once | INTRAMUSCULAR | Status: AC
  Administered 2012-02-26: 1 mg via INTRAVENOUS
  Filled 2012-02-26: qty 1

## 2012-02-26 MED ORDER — ONDANSETRON HCL 4 MG PO TABS: 4.0000 mg | ORAL_TABLET | Freq: Four times a day (QID) | ORAL | Status: DC

## 2012-02-26 NOTE — ED Provider Notes (Signed)
Sudden vomit and bilat trapezius, chest, and epigastric pain after dental procedure with reproducible tenderness to above-mentioned areas on exam.  Hurman Horn, MD 02/26/12 [phone]

## 2012-02-26 NOTE — ED Provider Notes (Signed)
I saw and evaluated the patient, reviewed the resident's note and I agree with the findings and plan.  Hurman Horn, MD 02/26/12 [phone]

## 2012-02-28 LAB — POCT PREGNANCY, URINE: Preg Test, Ur: NEGATIVE

## 2012-02-29 ENCOUNTER — Emergency Department (HOSPITAL_COMMUNITY): Payer: 59

## 2012-02-29 ENCOUNTER — Other Ambulatory Visit: Payer: Self-pay

## 2012-02-29 ENCOUNTER — Inpatient Hospital Stay (HOSPITAL_COMMUNITY)
Admission: EM | Admit: 2012-02-29 | Discharge: 2012-03-07 | DRG: 418 | Disposition: A | Discharge status: Home / Self Care | Payer: 59 | Attending: General Surgery | Admitting: General Surgery

## 2012-02-29 ENCOUNTER — Encounter (HOSPITAL_COMMUNITY): Payer: Self-pay | Admitting: Cardiology

## 2012-02-29 DIAGNOSIS — E871 Hypo-osmolality and hyponatremia: Secondary | ICD-10-CM | POA: Diagnosis not present

## 2012-02-29 DIAGNOSIS — G43909 Migraine, unspecified, not intractable, without status migrainosus: Secondary | ICD-10-CM | POA: Diagnosis present

## 2012-02-29 DIAGNOSIS — K8050 Calculus of bile duct without cholangitis or cholecystitis without obstruction: Secondary | ICD-10-CM

## 2012-02-29 DIAGNOSIS — Z885 Allergy status to narcotic agent status: Secondary | ICD-10-CM

## 2012-02-29 DIAGNOSIS — Z87891 Personal history of nicotine dependence: Secondary | ICD-10-CM

## 2012-02-29 DIAGNOSIS — K8066 Calculus of gallbladder and bile duct with acute and chronic cholecystitis without obstruction: Secondary | ICD-10-CM

## 2012-02-29 DIAGNOSIS — D6800 Von Willebrand disease, unspecified: Secondary | ICD-10-CM | POA: Diagnosis present

## 2012-02-29 DIAGNOSIS — K810 Acute cholecystitis: Secondary | ICD-10-CM | POA: Diagnosis present

## 2012-02-29 DIAGNOSIS — F329 Major depressive disorder, single episode, unspecified: Secondary | ICD-10-CM | POA: Diagnosis present

## 2012-02-29 DIAGNOSIS — F3289 Other specified depressive episodes: Secondary | ICD-10-CM | POA: Diagnosis present

## 2012-02-29 DIAGNOSIS — A6000 Herpesviral infection of urogenital system, unspecified: Secondary | ICD-10-CM | POA: Diagnosis present

## 2012-02-29 DIAGNOSIS — T38895A Adverse effect of other hormones and synthetic substitutes, initial encounter: Secondary | ICD-10-CM | POA: Diagnosis not present

## 2012-02-29 DIAGNOSIS — Z6841 Body Mass Index (BMI) 40.0 and over, adult: Secondary | ICD-10-CM

## 2012-02-29 DIAGNOSIS — Z882 Allergy status to sulfonamides status: Secondary | ICD-10-CM

## 2012-02-29 DIAGNOSIS — K8042 Calculus of bile duct with acute cholecystitis without obstruction: Principal | ICD-10-CM | POA: Diagnosis present

## 2012-02-29 DIAGNOSIS — J45909 Unspecified asthma, uncomplicated: Secondary | ICD-10-CM | POA: Diagnosis present

## 2012-02-29 DIAGNOSIS — Z79899 Other long term (current) drug therapy: Secondary | ICD-10-CM

## 2012-02-29 DIAGNOSIS — F1020 Alcohol dependence, uncomplicated: Secondary | ICD-10-CM | POA: Diagnosis present

## 2012-02-29 LAB — HEPATIC FUNCTION PANEL
ALT: 410 U/L — ABNORMAL HIGH (ref 0–35)
Albumin: 4 g/dL (ref 3.5–5.2)
Alkaline Phosphatase: 181 U/L — ABNORMAL HIGH (ref 39–117)
Total Bilirubin: 2.9 mg/dL — ABNORMAL HIGH (ref 0.3–1.2)
Total Protein: 7.8 g/dL (ref 6.0–8.3)

## 2012-02-29 LAB — CBC
Hemoglobin: 11.6 g/dL — ABNORMAL LOW (ref 12.0–15.0)
MCH: 29.1 pg (ref 26.0–34.0)
MCV: 88.9 fL (ref 78.0–100.0)
Platelets: 358 10*3/uL (ref 150–400)
RBC: 3.98 MIL/uL (ref 3.87–5.11)
WBC: 8.4 10*3/uL (ref 4.0–10.5)

## 2012-02-29 LAB — BASIC METABOLIC PANEL
CO2: 28 mEq/L (ref 19–32)
Calcium: 9.7 mg/dL (ref 8.4–10.5)
Chloride: 98 mEq/L (ref 96–112)
Glucose, Bld: 98 mg/dL (ref 70–99)
Potassium: 3.4 mEq/L — ABNORMAL LOW (ref 3.5–5.1)
Sodium: 137 mEq/L (ref 135–145)

## 2012-02-29 LAB — POCT I-STAT, CHEM 8
BUN: 5 mg/dL — ABNORMAL LOW (ref 6–23)
Calcium, Ion: 1.21 mmol/L (ref 1.12–1.23)
Chloride: 101 mEq/L (ref 96–112)
Creatinine, Ser: 0.9 mg/dL (ref 0.50–1.10)
Sodium: 141 mEq/L (ref 135–145)

## 2012-02-29 MED ORDER — ONDANSETRON HCL 4 MG/2ML IJ SOLN
4.0000 mg | Freq: Once | INTRAMUSCULAR | Status: AC
  Administered 2012-02-29: 4 mg via INTRAVENOUS
  Filled 2012-02-29: qty 2

## 2012-02-29 MED ORDER — SODIUM CHLORIDE 0.9 % IV BOLUS (SEPSIS)
1000.0000 mL | Freq: Once | INTRAVENOUS | Status: AC
  Administered 2012-02-29: 1000 mL via INTRAVENOUS

## 2012-02-29 MED ORDER — MORPHINE SULFATE 4 MG/ML IJ SOLN
4.0000 mg | Freq: Once | INTRAMUSCULAR | Status: AC
  Administered 2012-02-29: 4 mg via INTRAVENOUS
  Filled 2012-02-29: qty 1

## 2012-02-29 NOTE — ED Notes (Signed)
Patient transported to US 

## 2012-02-29 NOTE — H&P (Signed)
Ann [name] is an 31 y.o. female.   Chief Complaint: Epigastric pain nausea and vomiting HPI: 4 days ago the patient developed epigastric pain. This radiated through to her back. She associated nausea and vomiting. It has been persisting and are not fashion since that time. She was seen once emergency department and this was thought to be due to reaction from recent oral surgery. Her symptoms did not resolve. She returns tonight. Workup includes liver function tests which are elevated an ultrasound showing gallstones and dilated common bile duct.  Past Medical History  Diagnosis Date  . Trichomonas   . Von Willebrand disease   . HSV-2 infection     NO RECENT OUTBREAKS  . Anemia   . Migraine   . Depression   . Spinal headache   . Abnormal Pap smear 2010    colpo  . H/O varicella   . H/O cystitis   . H/O pyelonephritis     frequently during childhood  . Alcoholic     daily  . Asthma     unable to find inhaler  . Pyelonephritis     last episode "years ago"  . Blood dyscrasia     last seen by Dr Cyndie Chime 3yrs ago    Past Surgical History  Procedure Date  . Back surgery     4 back surgeries  . Laparoscopy 2011  . Wisdom tooth extraction 2001  . Dilation and curettage of uterus   . Cesarean section 2004  2013    2 layer closure    Family History  Problem Relation Age of Onset  . Stroke Mother   . Arthritis Mother   . Depression Mother   . COPD Father   . Kidney disease Paternal Uncle   . Stroke Maternal Grandfather   . Heart disease Paternal Grandfather    Social History:  reports that she has quit smoking. She has never used smokeless tobacco. She reports that she does not drink alcohol or use illicit drugs.  Allergies:  Allergies  Allergen Reactions  . Darvocet (Propoxyphene-Acetaminophen) Nausea And Vomiting  . Codeine Nausea And Vomiting and Rash  . Sulfa Antibiotics Rash     (Not in a hospital admission)  Results for orders placed during the  hospital encounter of 02/29/12 (from the past 48 hour(s))  BASIC METABOLIC PANEL     Status: Abnormal   Collection Time   02/29/12  3:20 PM      Component Value Range Comment   Sodium 137  135 - 145 mEq/L    Potassium 3.4 (*) 3.5 - 5.1 mEq/L    Chloride 98  96 - 112 mEq/L    CO2 28  19 - 32 mEq/L    Glucose, Bld 98  70 - 99 mg/dL    BUN 7  6 - 23 mg/dL    Creatinine, Ser 7.84  0.50 - 1.10 mg/dL    Calcium 9.7  8.4 - 69.6 mg/dL    GFR calc non Af Amer >90  >90 mL/min    GFR calc Af Amer >90  >90 mL/min   CBC     Status: Abnormal   Collection Time   02/29/12  3:20 PM      Component Value Range Comment   WBC 8.4  4.0 - 10.5 K/uL    RBC 3.98  3.87 - 5.11 MIL/uL    Hemoglobin 11.6 (*) 12.0 - 15.0 g/dL    HCT 29.5 (*) 28.4 - 46.0 %  MCV 88.9  78.0 - 100.0 fL    MCH 29.1  26.0 - 34.0 pg    MCHC 32.8  30.0 - 36.0 g/dL    RDW 16.1  09.6 - 04.5 %    Platelets 358  150 - 400 K/uL   HEPATIC FUNCTION PANEL     Status: Abnormal   Collection Time   02/29/12  3:20 PM      Component Value Range Comment   Total Protein 7.8  6.0 - 8.3 g/dL    Albumin 4.0  3.5 - 5.2 g/dL    AST 409 (*) 0 - 37 U/L    ALT 410 (*) 0 - 35 U/L    Alkaline Phosphatase 181 (*) 39 - 117 U/L    Total Bilirubin 2.9 (*) 0.3 - 1.2 mg/dL    Bilirubin, Direct 1.8 (*) 0.0 - 0.3 mg/dL    Indirect Bilirubin 1.1 (*) 0.3 - 0.9 mg/dL   LIPASE, BLOOD     Status: Normal   Collection Time   02/29/12  3:20 PM      Component Value Range Comment   Lipase 13  11 - 59 U/L   POCT I-STAT, CHEM 8     Status: Abnormal   Collection Time   02/29/12  3:43 PM      Component Value Range Comment   Sodium 141  135 - 145 mEq/L    Potassium 3.5  3.5 - 5.1 mEq/L    Chloride 101  96 - 112 mEq/L    BUN 5 (*) 6 - 23 mg/dL    Creatinine, Ser 8.11  0.50 - 1.10 mg/dL    Glucose, Bld 98  70 - 99 mg/dL    Calcium, Ion 9.14  7.82 - 1.23 mmol/L    TCO2 26  0 - 100 mmol/L    Hemoglobin 12.6  12.0 - 15.0 g/dL    HCT 95.6  21.3 - 08.6 %    US  Abdomen Complete  02/29/2012  *RADIOLOGY REPORT*  Clinical Data:  Abdominal pain.  Elevated liver enzymes.  Emesis.  COMPLETE ABDOMINAL ULTRASOUND  Comparison:  07/29/2007  Findings:  Gallbladder:  A 2.4 cm shadowing mobile gallstone is present the gallbladder.  No overt gallbladder wall thickening or pericholecystic fluid.  Sonographic Murphy's sign absent.  Common bile duct:  Measures 10 mm, considerably dilated.  No directly demonstrated choledocholithiasis.  Liver:  No focal lesion identified.  Within normal limits in parenchymal echogenicity.  IVC:  Appears normal.  Pancreas:  Pancreatic head and body appear normal.  Pancreatic tail obscured by bowel gas.  Spleen:  Measures 6.9 cm craniocaudad and appears normal.  Right Kidney:  Measures 11.5 cm in length and appears normal.  Left Kidney:  Measures 12.0 cm in length and appears normal.  Abdominal aorta:  No aneurysm identified.  IMPRESSION:  1.  Cholelithiasis. 2.  Dilated common bile duct at 10 mm diameter.  The CBD caliber appeared normal back on the CT scan of 07/29/2007.  We do not directly demonstrate choledocholithiasis, and no definite intrahepatic biliary dilatation is currently observed.   Original Report Authenticated By: Dellia Cloud, M.D.     Review of Systems  Constitutional: Negative.   HENT:       Recent tooth extraction  Eyes: Negative.   Respiratory: Negative.   Cardiovascular: Negative.   Gastrointestinal: Positive for nausea, vomiting and abdominal pain.  Genitourinary: Negative.   Musculoskeletal: Negative.   Skin: Negative.   Neurological: Negative.  Blood pressure 140/59, pulse 72, temperature 98.3 F (36.8 C), temperature source Oral, resp. rate 20, last menstrual period 01/28/2012, SpO2 95.00%. Physical Exam  Constitutional: She is oriented to person, place, and time. She appears well-developed and well-nourished. No distress.  HENT:  Head: Normocephalic and atraumatic.  Nose: Nose normal.    Mouth/Throat: Oropharynx is clear and moist. No oropharyngeal exudate.  Eyes: EOM are normal. Pupils are equal, round, and reactive to light. No scleral icterus.  Neck: Normal range of motion. No tracheal deviation present.  Cardiovascular: Normal rate, regular rhythm, normal heart sounds and intact distal pulses.   Respiratory: Effort normal and breath sounds normal. No stridor. No respiratory distress. She has no wheezes. She has no rales.  GI: Soft. Bowel sounds are normal. She exhibits no distension. There is tenderness. There is no rebound and no guarding.       Mild tenderness epigastrium and right upper quadrant  Musculoskeletal: Normal range of motion.  Neurological: She is alert and oriented to person, place, and time.  Skin: Skin is warm and dry.     Assessment/Plan Cholelithiasis and likely choledocholithiasis: We'll admit, keep n.p.o., give IV fluids, start antibiotics, and recheck labs in the morning. She may need gastroenterology consultation for ERCP if liver function tests remain elevated. We will also plan cholecystectomy this admission. Plan was discussed in detail with the patient and her mother.  Jermane Brayboy E 02/29/2012, 10:35 PM

## 2012-02-29 NOTE — ED Notes (Signed)
Reports she was seen here on Friday with same symptoms. States after going home she was still feeling bad. Can't keep anything down. Pain is under the left breast and into her shoulder blade. Reports she was unable to get into she her PCP.

## 2012-02-29 NOTE — ED Provider Notes (Signed)
History     CSN: [id number]  Arrival date & time 02/29/12  1422   None     Chief Complaint  Patient presents with  . Chest Pain  . Emesis    (Consider location/radiation/quality/duration/timing/severity/associated sxs/prior treatment) Patient is a 31 y.o. female presenting with vomiting. The history is provided by the patient.  Emesis  This is a recurrent problem. The current episode started more than 2 days ago. The problem occurs 5 to 10 times per day. The problem has not changed since onset.The emesis has an appearance of stomach contents. There has been no fever. Associated symptoms include abdominal pain. Pertinent negatives include no arthralgias, no chills, no cough, no diarrhea, no fever, no headaches, no myalgias, no sweats and no URI. Risk factors: recent oral surgery.    Past Medical History  Diagnosis Date  . Trichomonas   . Von Willebrand disease   . HSV-2 infection     NO RECENT OUTBREAKS  . Anemia   . Migraine   . Depression   . Spinal headache   . Abnormal Pap smear 2010    colpo  . H/O varicella   . H/O cystitis   . H/O pyelonephritis     frequently during childhood  . Alcoholic     daily  . Asthma     unable to find inhaler  . Pyelonephritis     last episode "years ago"  . Blood dyscrasia     last seen by Dr Cyndie Chime 42yrs ago    Past Surgical History  Procedure Date  . Back surgery     4 back surgeries  . Laparoscopy 2011  . Wisdom tooth extraction 2001  . Dilation and curettage of uterus   . Cesarean section 2004  2013    2 layer closure    Family History  Problem Relation Age of Onset  . Stroke Mother   . Arthritis Mother   . Depression Mother   . COPD Father   . Kidney disease Paternal Uncle   . Stroke Maternal Grandfather   . Heart disease Paternal Grandfather     History  Substance Use Topics  . Smoking status: Former Games developer  . Smokeless tobacco: Never Used  . Alcohol Use: No    OB History    Grav Para Term  Preterm Abortions TAB SAB Ect Mult Living   3 2 2  1 1    2       Review of Systems  Constitutional: Negative for fever and chills.  Respiratory: Negative for cough and shortness of breath.   Cardiovascular: Negative for chest pain.  Gastrointestinal: Positive for nausea, vomiting and abdominal pain. Negative for diarrhea.  Musculoskeletal: Negative for myalgias and arthralgias.  Neurological: Negative for headaches.  All other systems reviewed and are negative.    Allergies  Darvocet; Codeine; and Sulfa antibiotics  Home Medications   Current Outpatient Rx  Name Route Sig Dispense Refill  . ATROPINE SULFATE 1 % OP SOLN Right Eye Place 1 drop into the right eye daily. Floaters in vision and blurred vision    . IBUPROFEN 200 MG PO TABS Oral Take 600 mg by mouth every 6 (six) hours as needed. For pain    . ONDANSETRON HCL 4 MG PO TABS Oral Take 1 tablet (4 mg total) by mouth every 6 (six) hours. 12 tablet 0  . PREDNISOLONE ACETATE 1 % OP SUSP Right Eye Place 1 drop into the right eye 4 (four) times daily. Floater and blurred  vision in right eye    . DESMOPRESSIN ACETATE 1.5 MG/ML NA SOLN Nasal Place 1 spray into the nose as needed. Before minor surgery    . HYDROCODONE-ACETAMINOPHEN 5-500 MG PO TABS Oral Take 1-2 tablets by mouth every 4 (four) hours as needed. Dental surgery on 10/18      BP 130/106  Pulse 70  Temp 98.1 F (36.7 C) (Oral)  Resp 18  SpO2 100%  LMP 01/28/2012  Physical Exam  Nursing note and vitals reviewed. Constitutional: She is oriented to person, place, and time. She appears well-developed and well-nourished. No distress.  HENT:  Head: Normocephalic and atraumatic.  Eyes: EOM are normal. Pupils are equal, round, and reactive to light.  Neck: Normal range of motion.  Cardiovascular: Normal rate and normal heart sounds.   Pulmonary/Chest: Effort normal and breath sounds normal. No respiratory distress.  Abdominal: Soft. She exhibits no distension. There  is no tenderness.    Musculoskeletal: Normal range of motion.  Neurological: She is alert and oriented to person, place, and time.  Skin: Skin is warm and dry.    ED Course  Procedures (including critical care time)  Labs Reviewed  BASIC METABOLIC PANEL - Abnormal; Notable for the following:    Potassium 3.4 (*)     All other components within normal limits  CBC - Abnormal; Notable for the following:    Hemoglobin 11.6 (*)     HCT 35.4 (*)     All other components within normal limits  POCT I-STAT, CHEM 8 - Abnormal; Notable for the following:    BUN 5 (*)     All other components within normal limits  HEPATIC FUNCTION PANEL - Abnormal; Notable for the following:    AST 242 (*)     ALT 410 (*)     Alkaline Phosphatase 181 (*)     Total Bilirubin 2.9 (*)     Bilirubin, Direct 1.8 (*)     Indirect Bilirubin 1.1 (*)     All other components within normal limits  LIPASE, BLOOD   US Abdomen Complete  02/29/2012  *RADIOLOGY REPORT*  Clinical Data:  Abdominal pain.  Elevated liver enzymes.  Emesis.  COMPLETE ABDOMINAL ULTRASOUND  Comparison:  07/29/2007  Findings:  Gallbladder:  A 2.4 cm shadowing mobile gallstone is present the gallbladder.  No overt gallbladder wall thickening or pericholecystic fluid.  Sonographic Murphy's sign absent.  Common bile duct:  Measures 10 mm, considerably dilated.  No directly demonstrated choledocholithiasis.  Liver:  No focal lesion identified.  Within normal limits in parenchymal echogenicity.  IVC:  Appears normal.  Pancreas:  Pancreatic head and body appear normal.  Pancreatic tail obscured by bowel gas.  Spleen:  Measures 6.9 cm craniocaudad and appears normal.  Right Kidney:  Measures 11.5 cm in length and appears normal.  Left Kidney:  Measures 12.0 cm in length and appears normal.  Abdominal aorta:  No aneurysm identified.  IMPRESSION:  1.  Cholelithiasis. 2.  Dilated common bile duct at 10 mm diameter.  The CBD caliber appeared normal back on the  CT scan of 07/29/2007.  We do not directly demonstrate choledocholithiasis, and no definite intrahepatic biliary dilatation is currently observed.   Original Report Authenticated By: Dellia Cloud, M.D.      1. Von Willebrand disease - hx PPH   2. Choledocholithiasis   3. Transaminitis       MDM  6:56 PM Pt seen and examined. Pt with continued nausea, vomiting and abdominal  pain (had previously complained of chest pain) following an oral surgery. Pt unable to tolerate PO with ttp epigastric area. Will get CMP and lipase.   LFTs elevated. Will get abdominal US to look for stones or liver pathology.    11:04 PM Pt with dilated CBD and elevated LFTs. Pt will be admitted to surgery.      Daleen Bo, MD 02/29/12 [phone]

## 2012-03-01 ENCOUNTER — Encounter (HOSPITAL_COMMUNITY): Payer: Self-pay | Admitting: *Deleted

## 2012-03-01 LAB — COMPREHENSIVE METABOLIC PANEL
ALT: 278 U/L — ABNORMAL HIGH (ref 0–35)
AST: 106 U/L — ABNORMAL HIGH (ref 0–37)
CO2: 28 mEq/L (ref 19–32)
Chloride: 103 mEq/L (ref 96–112)
GFR calc non Af Amer: >90 mL/min (ref >90)
Potassium: 3.5 mEq/L (ref 3.5–5.1)
Sodium: 140 mEq/L (ref 135–145)
Total Bilirubin: 1 mg/dL (ref 0.3–1.2)

## 2012-03-01 LAB — CBC
Hemoglobin: 10.7 g/dL — ABNORMAL LOW (ref 12.0–15.0)
MCH: 28.8 pg (ref 26.0–34.0)
MCHC: 31.8 g/dL (ref 30.0–36.0)
Platelets: 326 10*3/uL (ref 150–400)

## 2012-03-01 MED ORDER — HYDROMORPHONE HCL PF 1 MG/ML IJ SOLN
0.5000 mg | INTRAMUSCULAR | Status: DC | PRN
  Administered 2012-03-01 – 2012-03-02 (×4): 2 mg via INTRAVENOUS
  Administered 2012-03-02: 1 mg via INTRAVENOUS
  Administered 2012-03-02 (×2): 2 mg via INTRAVENOUS
  Administered 2012-03-02 (×2): 1 mg via INTRAVENOUS
  Administered 2012-03-02 – 2012-03-03 (×2): 2 mg via INTRAVENOUS
  Administered 2012-03-03: 1 mg via INTRAVENOUS
  Administered 2012-03-03 (×6): 2 mg via INTRAVENOUS
  Administered 2012-03-05 (×4): 1 mg via INTRAVENOUS
  Administered 2012-03-06 (×3): 2 mg via INTRAVENOUS
  Administered 2012-03-06: 1 mg via INTRAVENOUS
  Administered 2012-03-06: 2 mg via INTRAVENOUS
  Filled 2012-03-01 (×2): qty 2
  Filled 2012-03-01: qty 1
  Filled 2012-03-01: qty 2
  Filled 2012-03-01: qty 1
  Filled 2012-03-01 (×4): qty 2
  Filled 2012-03-01: qty 1
  Filled 2012-03-01 (×3): qty 2
  Filled 2012-03-01: qty 1
  Filled 2012-03-01: qty 2
  Filled 2012-03-01: qty 1
  Filled 2012-03-01 (×3): qty 2
  Filled 2012-03-01: qty 1
  Filled 2012-03-01: qty 2
  Filled 2012-03-01: qty 1
  Filled 2012-03-01: qty 2
  Filled 2012-03-01: qty 1
  Filled 2012-03-01 (×2): qty 2
  Filled 2012-03-01: qty 1

## 2012-03-01 MED ORDER — PANTOPRAZOLE SODIUM 40 MG IV SOLR
40.0000 mg | Freq: Every day | INTRAVENOUS | Status: DC
  Administered 2012-03-01 – 2012-03-04 (×5): 40 mg via INTRAVENOUS
  Filled 2012-03-01 (×6): qty 40

## 2012-03-01 MED ORDER — KCL IN DEXTROSE-NACL 20-5-0.45 MEQ/L-%-% IV SOLN
INTRAVENOUS | Status: DC
  Administered 2012-03-01 – 2012-03-04 (×7): via INTRAVENOUS
  Filled 2012-03-01 (×14): qty 1000

## 2012-03-01 MED ORDER — CHLORHEXIDINE GLUCONATE 0.12 % MT SOLN
15.0000 mL | Freq: Two times a day (BID) | OROMUCOSAL | Status: DC
  Administered 2012-03-01 – 2012-03-07 (×10): 15 mL via OROMUCOSAL
  Filled 2012-03-01 (×12): qty 15

## 2012-03-01 MED ORDER — ATROPINE SULFATE 1 % OP SOLN
1.0000 [drp] | Freq: Every day | OPHTHALMIC | Status: DC
  Administered 2012-03-01 – 2012-03-07 (×7): 1 [drp] via OPHTHALMIC
  Filled 2012-03-01 (×2): qty 2

## 2012-03-01 MED ORDER — MORPHINE SULFATE 2 MG/ML IJ SOLN
2.0000 mg | INTRAMUSCULAR | Status: DC | PRN
  Administered 2012-03-01: 2 mg via INTRAVENOUS
  Administered 2012-03-01 (×2): 4 mg via INTRAVENOUS
  Filled 2012-03-01 (×2): qty 2
  Filled 2012-03-01 (×2): qty 1

## 2012-03-01 MED ORDER — CIPROFLOXACIN IN D5W 400 MG/200ML IV SOLN
400.0000 mg | Freq: Two times a day (BID) | INTRAVENOUS | Status: DC
  Administered 2012-03-01 – 2012-03-06 (×11): 400 mg via INTRAVENOUS
  Filled 2012-03-01 (×15): qty 200

## 2012-03-01 MED ORDER — DIPHENHYDRAMINE HCL 50 MG/ML IJ SOLN
12.5000 mg | Freq: Four times a day (QID) | INTRAMUSCULAR | Status: DC | PRN
  Administered 2012-03-01 – 2012-03-06 (×10): 12.5 mg via INTRAVENOUS
  Filled 2012-03-01 (×11): qty 1

## 2012-03-01 MED ORDER — DESMOPRESSIN ACETATE 4 MCG/ML IJ SOLN
0.3000 ug/kg | INTRAMUSCULAR | Status: AC
  Administered 2012-03-02: 35.2 ug via INTRAVENOUS
  Filled 2012-03-01: qty 8.8

## 2012-03-01 MED ORDER — BIOTENE DRY MOUTH MT LIQD
15.0000 mL | Freq: Two times a day (BID) | OROMUCOSAL | Status: DC
  Administered 2012-03-01: 15 mL via OROMUCOSAL

## 2012-03-01 MED ORDER — PREDNISOLONE ACETATE 1 % OP SUSP
1.0000 [drp] | Freq: Four times a day (QID) | OPHTHALMIC | Status: DC
  Administered 2012-03-01 – 2012-03-07 (×22): 1 [drp] via OPHTHALMIC
  Filled 2012-03-01: qty 5
  Filled 2012-03-01: qty 1

## 2012-03-01 MED ORDER — ONDANSETRON HCL 4 MG/2ML IJ SOLN
4.0000 mg | Freq: Four times a day (QID) | INTRAMUSCULAR | Status: DC | PRN
  Administered 2012-03-02 – 2012-03-06 (×5): 4 mg via INTRAVENOUS
  Filled 2012-03-01 (×5): qty 2

## 2012-03-01 NOTE — Progress Notes (Signed)
Agree with A&P of KO,PA.  I have discussed the indications for laparoscopic cholecystectomy with her and provided educational material. We have discussed the risks of surgery, including general risks such as bleeding, infection, lung and heart issues etc. We have also discussed the potential for injuries to other organs, bile duct leaks, and other unexpected events. We have also talked about the fact that this may need to be converted to open under certain circumstances. We discussed the typical post op recovery and the fact that there is a good likelihood of improvement in symptoms and return to normal activity.  She understands this and wishes to proceed to schedule surgery. I believe all of her questions have been answered.   If her lft's in am are OK with proceed to lap chole.

## 2012-03-01 NOTE — ED Provider Notes (Signed)
  I performed a history and physical examination of Ann [name] and discussed her management with Dr. Aubery Lapping.  I agree with the history, physical, assessment, and plan of care, with the following exceptions: None  On my exam this young female was in no distress.  We discussed all findings, including her ultrasound results.  She was admitted for further evaluation and management Arrietty Dercole, Elvis Coil, MD 03/01/12 0005

## 2012-03-01 NOTE — Progress Notes (Signed)
Patient ID: Ann [name], female   DOB: 07/29/1980, 31 y.o.   MRN: [id number]    Subjective: Pt still c/o epigastric abdominal pain.    Objective: Vital signs in last 24 hours: Temp:  [98.1 F (36.7 C)-98.4 F (36.9 C)] 98.3 F (36.8 C) (10/23 0601) Pulse Rate:  [66-80] 77  (10/23 0601) Resp:  [18-20] 18  (10/23 0601) BP: (115-153)/(59-106) 115/68 mmHg (10/23 0601) SpO2:  [95 %-100 %] 97 % (10/23 0601) Weight:  [259 lb (117.482 kg)] 259 lb (117.482 kg) (10/23 0054) Last BM Date: 02/29/12  Intake/Output from previous day: 10/22 0701 - 10/23 0700 In: 737.5 [I.V.:537.5; IV Piggyback:200] Out: -  Intake/Output this shift:    PE: Abd: soft, still tender, now mostly in RUQ, but still mildly tender in epigastrium and LUQ, +BS Heart: reg Lungs: CTAB  Lab Results:   Basename 03/01/12 0650 02/29/12 1543 02/29/12 1520  WBC 8.2 -- 8.4  HGB 10.7* 12.6 --  HCT 33.6* 37.0 --  PLT 326 -- 358   BMET  Basename 03/01/12 0650 02/29/12 1543 02/29/12 1520  NA 140 141 --  K 3.5 3.5 --  CL 103 101 --  CO2 28 -- 28  GLUCOSE 113* 98 --  BUN 6 5* --  CREATININE 0.77 0.90 --  CALCIUM 8.8 -- 9.7   PT/INR No results found for this basename: LABPROT:2,INR:2 in the last 72 hours CMP     Component Value Date/Time   NA 140 03/01/2012 0650   K 3.5 03/01/2012 0650   CL 103 03/01/2012 0650   CO2 28 03/01/2012 0650   GLUCOSE 113* 03/01/2012 0650   BUN 6 03/01/2012 0650   CREATININE 0.77 03/01/2012 0650   CREATININE 0.59 09/07/2011 1801   CALCIUM 8.8 03/01/2012 0650   PROT 7.0 03/01/2012 0650   ALBUMIN 3.5 03/01/2012 0650   AST 106* 03/01/2012 0650   ALT 278* 03/01/2012 0650   ALKPHOS 150* 03/01/2012 0650   BILITOT 1.0 03/01/2012 0650   GFRNONAA >90 03/01/2012 0650   GFRAA >90 03/01/2012 0650   Lipase     Component Value Date/Time   LIPASE 13 02/29/2012 1520       Studies/Results: US Abdomen Complete  02/29/2012  *RADIOLOGY REPORT*  Clinical Data:  Abdominal pain.   Elevated liver enzymes.  Emesis.  COMPLETE ABDOMINAL ULTRASOUND  Comparison:  07/29/2007  Findings:  Gallbladder:  A 2.4 cm shadowing mobile gallstone is present the gallbladder.  No overt gallbladder wall thickening or pericholecystic fluid.  Sonographic Murphy's sign absent.  Common bile duct:  Measures 10 mm, considerably dilated.  No directly demonstrated choledocholithiasis.  Liver:  No focal lesion identified.  Within normal limits in parenchymal echogenicity.  IVC:  Appears normal.  Pancreas:  Pancreatic head and body appear normal.  Pancreatic tail obscured by bowel gas.  Spleen:  Measures 6.9 cm craniocaudad and appears normal.  Right Kidney:  Measures 11.5 cm in length and appears normal.  Left Kidney:  Measures 12.0 cm in length and appears normal.  Abdominal aorta:  No aneurysm identified.  IMPRESSION:  1.  Cholelithiasis. 2.  Dilated common bile duct at 10 mm diameter.  The CBD caliber appeared normal back on the CT scan of 07/29/2007.  We do not directly demonstrate choledocholithiasis, and no definite intrahepatic biliary dilatation is currently observed.   Original Report Authenticated By: Dellia Cloud, M.D.     Anti-infectives: Anti-infectives     Start     Dose/Rate Route Frequency Ordered Stop  03/01/12 0100   ciprofloxacin (CIPRO) IVPB 400 mg        400 mg 200 mL/hr over 60 Minutes Intravenous Every 12 hours 03/01/12 0030             Assessment/Plan  1. Cholelithiasis 2. ? Passed CBD stone  Plan: 1. May have ice chips and sips of clears 2. Will plan on OR tomorrow.  Will do IOC at time of surgery.  Will call GI after if IOC positive.   LOS: 1 day    Adoria Kawamoto E 03/01/2012, 11:36 AM Pager: [phone]

## 2012-03-02 ENCOUNTER — Encounter (HOSPITAL_COMMUNITY): Admission: EM | Disposition: A | Discharge status: Home / Self Care | Payer: Self-pay | Source: Home / Self Care

## 2012-03-02 ENCOUNTER — Inpatient Hospital Stay (HOSPITAL_COMMUNITY): Payer: 59 | Admitting: Anesthesiology

## 2012-03-02 ENCOUNTER — Inpatient Hospital Stay (HOSPITAL_COMMUNITY): Payer: 59

## 2012-03-02 ENCOUNTER — Encounter (HOSPITAL_COMMUNITY): Payer: Self-pay | Admitting: Anesthesiology

## 2012-03-02 DIAGNOSIS — K810 Acute cholecystitis: Secondary | ICD-10-CM | POA: Diagnosis present

## 2012-03-02 HISTORY — PX: CHOLECYSTECTOMY: SHX55

## 2012-03-02 LAB — SURGICAL PCR SCREEN: Staphylococcus aureus: NEGATIVE

## 2012-03-02 LAB — COMPREHENSIVE METABOLIC PANEL
ALT: 200 U/L — ABNORMAL HIGH (ref 0–35)
Calcium: 9.2 mg/dL (ref 8.4–10.5)
Creatinine, Ser: 0.82 mg/dL (ref 0.50–1.10)
GFR calc Af Amer: >90 mL/min (ref >90)
Glucose, Bld: 104 mg/dL — ABNORMAL HIGH (ref 70–99)
Sodium: 140 mEq/L (ref 135–145)
Total Protein: 7.2 g/dL (ref 6.0–8.3)

## 2012-03-02 SURGERY — LAPAROSCOPIC CHOLECYSTECTOMY WITH INTRAOPERATIVE CHOLANGIOGRAM
Anesthesia: General | Site: Abdomen | Wound class: Clean Contaminated

## 2012-03-02 MED ORDER — SODIUM CHLORIDE 0.9 % IV SOLN: INTRAVENOUS | Status: DC

## 2012-03-02 MED ORDER — LIDOCAINE HCL (CARDIAC) 20 MG/ML IV SOLN
INTRAVENOUS | Status: DC | PRN
  Administered 2012-03-02: 100 mg via INTRAVENOUS

## 2012-03-02 MED ORDER — HYDROMORPHONE HCL PF 1 MG/ML IJ SOLN
0.2500 mg | INTRAMUSCULAR | Status: DC | PRN
  Administered 2012-03-02 (×3): 0.5 mg via INTRAVENOUS

## 2012-03-02 MED ORDER — SODIUM CHLORIDE 0.9 % IR SOLN
Status: DC | PRN
  Administered 2012-03-02: 2000 mL

## 2012-03-02 MED ORDER — BUPIVACAINE HCL (PF) 0.25 % IJ SOLN
INTRAMUSCULAR | Status: AC
  Filled 2012-03-02: qty 30

## 2012-03-02 MED ORDER — HYDROMORPHONE HCL PF 1 MG/ML IJ SOLN
INTRAMUSCULAR | Status: AC
  Filled 2012-03-02: qty 1

## 2012-03-02 MED ORDER — SODIUM CHLORIDE 0.9 % IV SOLN
0.3000 ug/kg | Freq: Once | INTRAVENOUS | Status: AC
  Administered 2012-03-02: 35.2 ug via INTRAVENOUS
  Filled 2012-03-02: qty 8.8

## 2012-03-02 MED ORDER — LACTATED RINGERS IV SOLN
INTRAVENOUS | Status: DC | PRN
  Administered 2012-03-02 (×3): via INTRAVENOUS

## 2012-03-02 MED ORDER — PROPOFOL 10 MG/ML IV BOLUS
INTRAVENOUS | Status: DC | PRN
  Administered 2012-03-02: 200 mg via INTRAVENOUS

## 2012-03-02 MED ORDER — ONDANSETRON HCL 4 MG PO TABS
4.0000 mg | ORAL_TABLET | Freq: Four times a day (QID) | ORAL | Status: DC | PRN
  Administered 2012-03-04 – 2012-03-06 (×3): 4 mg via ORAL
  Filled 2012-03-02 (×3): qty 1

## 2012-03-02 MED ORDER — ONDANSETRON HCL 4 MG/2ML IJ SOLN
INTRAMUSCULAR | Status: DC | PRN
  Administered 2012-03-02: 4 mg via INTRAVENOUS

## 2012-03-02 MED ORDER — LACTATED RINGERS IV SOLN
INTRAVENOUS | Status: DC
  Administered 2012-03-02: 12:00:00 via INTRAVENOUS

## 2012-03-02 MED ORDER — DEXAMETHASONE SODIUM PHOSPHATE 4 MG/ML IJ SOLN
INTRAMUSCULAR | Status: DC | PRN
  Administered 2012-03-02: 10 mg via INTRAVENOUS

## 2012-03-02 MED ORDER — BUPIVACAINE HCL (PF) 0.25 % IJ SOLN
INTRAMUSCULAR | Status: DC | PRN
  Administered 2012-03-02: 28 mL

## 2012-03-02 MED ORDER — SODIUM CHLORIDE 0.9 % IV SOLN
INTRAVENOUS | Status: DC | PRN
  Administered 2012-03-02: 16:00:00

## 2012-03-02 MED ORDER — DROPERIDOL 2.5 MG/ML IJ SOLN
INTRAMUSCULAR | Status: DC | PRN
  Administered 2012-03-02: 0.625 mg via INTRAVENOUS

## 2012-03-02 MED ORDER — ROCURONIUM BROMIDE 100 MG/10ML IV SOLN
INTRAVENOUS | Status: DC | PRN
  Administered 2012-03-02: 50 mg via INTRAVENOUS
  Administered 2012-03-02: 20 mg via INTRAVENOUS

## 2012-03-02 MED ORDER — FENTANYL CITRATE 0.05 MG/ML IJ SOLN
INTRAMUSCULAR | Status: DC | PRN
  Administered 2012-03-02: 150 ug via INTRAVENOUS
  Administered 2012-03-02: 100 ug via INTRAVENOUS
  Administered 2012-03-02 (×3): 50 ug via INTRAVENOUS
  Administered 2012-03-02: 100 ug via INTRAVENOUS

## 2012-03-02 MED ORDER — OXYCODONE HCL 5 MG/5ML PO SOLN: 5.0000 mg | Freq: Once | ORAL | Status: DC | PRN

## 2012-03-02 MED ORDER — METOCLOPRAMIDE HCL 5 MG/ML IJ SOLN: 10.0000 mg | Freq: Once | INTRAMUSCULAR | Status: DC | PRN

## 2012-03-02 MED ORDER — ONDANSETRON HCL 4 MG/2ML IJ SOLN: 4.0000 mg | Freq: Four times a day (QID) | INTRAMUSCULAR | Status: DC | PRN

## 2012-03-02 MED ORDER — ARTIFICIAL TEARS OP OINT
TOPICAL_OINTMENT | OPHTHALMIC | Status: DC | PRN
  Administered 2012-03-02: 1 via OPHTHALMIC

## 2012-03-02 MED ORDER — MIDAZOLAM HCL 5 MG/5ML IJ SOLN
INTRAMUSCULAR | Status: DC | PRN
  Administered 2012-03-02 (×2): 1 mg via INTRAVENOUS

## 2012-03-02 MED ORDER — OXYCODONE HCL 5 MG PO TABS: 5.0000 mg | ORAL_TABLET | Freq: Once | ORAL | Status: DC | PRN

## 2012-03-02 MED ORDER — NEOSTIGMINE METHYLSULFATE 1 MG/ML IJ SOLN
INTRAMUSCULAR | Status: DC | PRN
  Administered 2012-03-02: 5 mg via INTRAVENOUS

## 2012-03-02 MED ORDER — SODIUM CHLORIDE 0.9 % IV SOLN
0.3000 ug/kg | INTRAVENOUS | Status: AC
  Administered 2012-03-03: 35.2 ug via INTRAVENOUS
  Filled 2012-03-02: qty 8.8

## 2012-03-02 MED ORDER — GLYCOPYRROLATE 0.2 MG/ML IJ SOLN
INTRAMUSCULAR | Status: DC | PRN
  Administered 2012-03-02: .8 mg via INTRAVENOUS

## 2012-03-02 MED ORDER — LABETALOL HCL 5 MG/ML IV SOLN
INTRAVENOUS | Status: DC | PRN
  Administered 2012-03-02 (×3): 5 mg via INTRAVENOUS

## 2012-03-02 SURGICAL SUPPLY — 40 items
ADH SKN CLS APL DERMABOND .7 (GAUZE/BANDAGES/DRESSINGS) ×1
APPLIER CLIP ROT 10 11.4 M/L (STAPLE) ×2
APR CLP MED LRG 11.4X10 (STAPLE) ×1
BAG SPEC RTRVL LRG 6X4 10 (ENDOMECHANICALS) ×1
BLADE SURG ROTATE 9660 (MISCELLANEOUS) IMPLANT
CANISTER SUCTION 2500CC (MISCELLANEOUS) ×2 IMPLANT
CHLORAPREP W/TINT 26ML (MISCELLANEOUS) ×2 IMPLANT
CLIP APPLIE ROT 10 11.4 M/L (STAPLE) ×1 IMPLANT
CLOTH BEACON ORANGE TIMEOUT ST (SAFETY) ×2 IMPLANT
COVER MAYO STAND STRL (DRAPES) ×2 IMPLANT
COVER SURGICAL LIGHT HANDLE (MISCELLANEOUS) ×2 IMPLANT
DECANTER SPIKE VIAL GLASS SM (MISCELLANEOUS) ×4 IMPLANT
DERMABOND ADVANCED (GAUZE/BANDAGES/DRESSINGS) ×1
DERMABOND ADVANCED .7 DNX12 (GAUZE/BANDAGES/DRESSINGS) ×1 IMPLANT
DRAPE C-ARM 42X72 X-RAY (DRAPES) ×2 IMPLANT
DRAPE UTILITY 15X26 W/TAPE STR (DRAPE) ×4 IMPLANT
ELECT REM PT RETURN 9FT ADLT (ELECTROSURGICAL) ×2
ELECTRODE REM PT RTRN 9FT ADLT (ELECTROSURGICAL) ×1 IMPLANT
FILTER SMOKE EVAC LAPAROSHD (FILTER) IMPLANT
GLOVE EUDERMIC 7 POWDERFREE (GLOVE) ×2 IMPLANT
GOWN PREVENTION PLUS XLARGE (GOWN DISPOSABLE) ×2 IMPLANT
GOWN STRL NON-REIN LRG LVL3 (GOWN DISPOSABLE) ×6 IMPLANT
HEMOSTAT SNOW SURGICEL 2X4 (HEMOSTASIS) ×2 IMPLANT
KIT BASIN OR (CUSTOM PROCEDURE TRAY) ×2 IMPLANT
KIT ROOM TURNOVER OR (KITS) ×2 IMPLANT
NS IRRIG 1000ML POUR BTL (IV SOLUTION) ×2 IMPLANT
PAD ARMBOARD 7.5X6 YLW CONV (MISCELLANEOUS) ×2 IMPLANT
POUCH SPECIMEN RETRIEVAL 10MM (ENDOMECHANICALS) ×2 IMPLANT
SCISSORS LAP 5X35 DISP (ENDOMECHANICALS) ×2 IMPLANT
SET CHOLANGIOGRAPH 5 50 .035 (SET/KITS/TRAYS/PACK) ×2 IMPLANT
SET IRRIG TUBING LAPAROSCOPIC (IRRIGATION / IRRIGATOR) ×2 IMPLANT
SPECIMEN JAR SMALL (MISCELLANEOUS) ×2 IMPLANT
SUT MNCRL AB 4-0 PS2 18 (SUTURE) ×2 IMPLANT
TOWEL OR 17X24 6PK STRL BLUE (TOWEL DISPOSABLE) ×2 IMPLANT
TOWEL OR 17X26 10 PK STRL BLUE (TOWEL DISPOSABLE) ×2 IMPLANT
TRAY LAPAROSCOPIC (CUSTOM PROCEDURE TRAY) ×2 IMPLANT
TROCAR XCEL BLUNT TIP 100MML (ENDOMECHANICALS) ×2 IMPLANT
TROCAR Z-THREAD FIOS 11X100 BL (TROCAR) ×2 IMPLANT
TROCAR Z-THREAD FIOS 5X100MM (TROCAR) ×4 IMPLANT
WATER STERILE IRR 1000ML POUR (IV SOLUTION) IMPLANT

## 2012-03-02 NOTE — Interval H&P Note (Signed)
History and Physical Interval Note:  03/02/2012 9:11 AM  Ann [name]  has presented today for surgery, with the diagnosis of  Cholelithiasis  The various methods of treatment have been discussed with the patient and family. After consideration of risks, benefits and other options for treatment, the patient has consented to  Procedure(s) (LRB) with comments: LAPAROSCOPIC CHOLECYSTECTOMY WITH INTRAOPERATIVE CHOLANGIOGRAM (N/A) as a surgical intervention .  The patient's history has been reviewed, patient examined, no change in status, stable for surgery.  I have reviewed the patient's chart and labs.  Questions were answered to the patient's satisfaction.   Patient has clinically improved since admission. LFT's also improved  [name]

## 2012-03-02 NOTE — Op Note (Signed)
Ann [name] January 09, 1981 [id number] 02/29/2012  Preoperative diagnosis: Early acute cholecystitis, possible choledocholitiasis  Postoperative diagnosis: same  Procedure: laparoscopic cholecystectomy with intraoperative cholangiogram  Surgeon: Currie Paris, MD, FACS  Assistant surgeon: Dr. Gaynelle Adu   Anesthesia: General  Clinical History and Indications: This patient has known gallstones, he was admitted with acute biliary colic and probable early cholecystitis. She had slightly elevated liver functions and there was a question of a common duct stone but her liver functions normalized and her pain improved. She comes to the operative room today for laparoscopic cholecystectomy.  Description of procedure: The patient was seen in the preoperative area. I reviewed the plans for the procedure with her as well as the risks and complications. She had no further questions and wished to proceed.  The patient was taken to the operating room. After satisfactory general endotracheal anesthesia had been obtained the abdomen was prepped and draped. A time out was done.  0.25% plain Marcaine was used at all incisions. I made an umbilical incision, identified the fascia and opened that, and entered the peritoneal cavity under direct vision. A 0 Vicryl pursestring suture was placed and the Hasson cannula was introduced under direct vision and secured with the pursestring. The abdomen was inflated to 15 cm.  The camera was placed and there were no gross abnormalities. The patient was then placed in reverse Trendelenburg and tilted to the left. A 10/11 trocar was placed in the epigastrium and two 5 mm trochars placed laterally all under direct vision.  The gallbladder was tensely distended and had to be decompressed. Retracted over the liver and dissection the area the cystic duct was begun. I was able to open eyes went up and separated at the branch of the cystic artery from the cystic duct and  divided that after clipping it. I then dissected out along segment of cystic duct and clipped that.   An intraoperative cholangiogram was then performed. A Cook catheter was introduced percutaneously and placed in the cystic duct. The cholangiogram showed good filling of the common duct and hepatic radicals but nofree flow into the duodenum. We administered some glucagon repeated the client gram but again no flow into the duodenum and it could be perhaps a tiny stone in the very distal duct. We decided to leave that in followup liver functions and obtain GI consultation. The catheter was removed and 3 clips placed on the stay side of the cystic duct. The duct was then divided.  Additional clips are placed on the cystic artery and it was divided. The gallbladder was then removed from below to above the coagulation current of the cautery. It was then placed in a bag to be retrieved later.  The abdomen was irrigated and a check for hemostasis along the bed of the gallbladder made. Once everything appeared to be dry, I put some snow and because of her preoperative diagnosis of von Willebrand's disease. There we were able to move the camera to the epigastric port and removed the gallbladder through the umbilical port.  The abdomen was reinsufflated and a final check for hemostasis made. There is no evidence of bleeding or bile leakage. The lateral ports were removed under direct vision and there was no bleeding. The umbilical site was closed with a pursestring, watching with the camera in the epigastric port. The abdomen was then deflated through the epigastric port and that was removed. Skin was closed with 4-0 Monocryl subcuticular and Dermabond.  The patient tolerated the procedure  well. There were no operative complications. EBL was minimal. All counts were correct.  Currie Paris, MD, FACS 03/02/2012 3:47 PM

## 2012-03-02 NOTE — Anesthesia Preprocedure Evaluation (Addendum)
Anesthesia Evaluation  Patient identified by MRN, date of birth, ID band Patient awake    Reviewed: Allergy & Precautions, H&P , NPO status , Patient's Chart, lab work & pertinent test results, reviewed documented beta blocker date and time   Airway Mallampati: II TM Distance: >3 FB Neck ROM: full    Dental  (+) Teeth Intact, Missing and Dental Advisory Given   Pulmonary asthma ,  Pt states that she never has to use her inhaler breath sounds clear to auscultation        Cardiovascular negative cardio ROS  Rhythm:regular     Neuro/Psych  Headaches, PSYCHIATRIC DISORDERS    GI/Hepatic negative GI ROS, Neg liver ROS,   Endo/Other  negative endocrine ROSMorbid obesity  Renal/GU Pt denies  negative genitourinary   Musculoskeletal   Abdominal   Peds  Hematology negative hematology ROS (+) Blood dyscrasia, , Von Willibrands Ds.  Got IV Desmopressin this morning.   Anesthesia Other Findings See surgeon's H&P   Reproductive/Obstetrics negative OB ROS                         Anesthesia Physical Anesthesia Plan  ASA: III  Anesthesia Plan: General   Post-op Pain Management:    Induction: Intravenous  Airway Management Planned: Oral ETT  Additional Equipment:   Intra-op Plan:   Post-operative Plan: Extubation in OR  Informed Consent: I have reviewed the patients History and Physical, chart, labs and discussed the procedure including the risks, benefits and alternatives for the proposed anesthesia with the patient or authorized representative who has indicated his/her understanding and acceptance.   Dental Advisory Given  Plan Discussed with: CRNA and Surgeon  Anesthesia Plan Comments:         Anesthesia Quick Evaluation

## 2012-03-02 NOTE — Consult Note (Addendum)
Reason for Consult: Choledocholithiasis Referring Physician: CCS  Taylr [name] HPI: This is a 31 year old female who presented to the hospital with complaints of abdominal pain with radiation to her back.  It was associated with nausea and vomiting.  Her liver enzymes were elevated.  A RUQ U/S revealed  cholelithiasis and a dilated CBD.  She underwent an uneventful lap chole, but she was identified to have a CBD stone in the distal CBD.   Past Medical History  Diagnosis Date  . Trichomonas   . Von Willebrand disease   . HSV-2 infection     NO RECENT OUTBREAKS  . Anemia   . Migraine   . Depression   . Abnormal Pap smear 2010    colpo  . H/O varicella   . H/O cystitis   . H/O pyelonephritis     frequently during childhood  . Alcoholic     daily  . Asthma     unable to find inhaler  . Pyelonephritis     last episode "years ago"  . Blood dyscrasia     last seen by Dr Cyndie Chime 23yrs ago    Past Surgical History  Procedure Date  . Back surgery     4 back surgeries  . Laparoscopy 2011  . Wisdom tooth extraction 2001  . Dilation and curettage of uterus   . Cesarean section 2004  2013    2 layer closure  . Multiple tooth extractions     Family History  Problem Relation Age of Onset  . Stroke Mother   . Arthritis Mother   . Depression Mother   . COPD Father   . Kidney disease Paternal Uncle   . Stroke Maternal Grandfather   . Heart disease Paternal Grandfather     Social History:  reports that she quit smoking about 11 years ago. She has never used smokeless tobacco. She reports that she drinks alcohol. She reports that she does not use illicit drugs.  Allergies:  Allergies  Allergen Reactions  . Darvocet (Propoxyphene-Acetaminophen) Nausea And Vomiting  . Codeine Nausea And Vomiting and Rash  . Sulfa Antibiotics Rash    Medications:  Scheduled:   . antiseptic oral rinse  15 mL Mouth Rinse q12n4p  . atropine  1 drop Right Eye Daily  . chlorhexidine  15  mL Mouth Rinse BID  . ciprofloxacin  400 mg Intravenous Q12H  . desmopressin (DDAVP) IV  0.3 mcg/kg Intravenous On Call to OR  . desmopressin (DDAVP) IV  0.3 mcg/kg Intravenous Once  . HYDROmorphone      . pantoprazole (PROTONIX) IV  40 mg Intravenous QHS  . prednisoLONE acetate  1 drop Right Eye QID   Continuous:   . dextrose 5 % and 0.45 % NaCl with KCl 20 mEq/L 125 mL/hr at 03/01/12 2229  . DISCONTD: lactated ringers 50 mL/hr at 03/02/12 1140    Results for orders placed during the hospital encounter of 02/29/12 (from the past 24 hour(s))  SURGICAL PCR SCREEN     Status: Normal   Collection Time   03/02/12  2:05 AM      Component Value Range   MRSA, PCR NEGATIVE  NEGATIVE   Staphylococcus aureus NEGATIVE  NEGATIVE  COMPREHENSIVE METABOLIC PANEL     Status: Abnormal   Collection Time   03/02/12  5:57 AM      Component Value Range   Sodium 140  135 - 145 mEq/L   Potassium 4.2  3.5 - 5.1 mEq/L  Chloride 103  96 - 112 mEq/L   CO2 28  19 - 32 mEq/L   Glucose, Bld 104 (*) 70 - 99 mg/dL   BUN 4 (*) 6 - 23 mg/dL   Creatinine, Ser 1.61  0.50 - 1.10 mg/dL   Calcium 9.2  8.4 - 09.6 mg/dL   Total Protein 7.2  6.0 - 8.3 g/dL   Albumin 3.6  3.5 - 5.2 g/dL   AST 57 (*) 0 - 37 U/L   ALT 200 (*) 0 - 35 U/L   Alkaline Phosphatase 140 (*) 39 - 117 U/L   Total Bilirubin 0.6  0.3 - 1.2 mg/dL   GFR calc non Af Amer >90  >90 mL/min   GFR calc Af Amer >90  >90 mL/min     Dg Cholangiogram Operative  03/02/2012  *RADIOLOGY REPORT*  Clinical Data:   Cholelithiasis  INTRAOPERATIVE CHOLANGIOGRAM  Technique:  Cholangiographic images from the C-arm fluoroscopic device were submitted for interpretation post-operatively.  Please see the procedural report for the amount of contrast and the fluoroscopy time utilized.  Comparison:  None  Findings: Persistent filling defect in the distal common bile duct near the ampulla with a meniscus.  The proximal common duct is mildly distended. Intrahepatic biliary  ducts are incompletely visualized, grossly unremarkable.  There is no evident passage of contrast material into the duodenum.  IMPRESSION 1.  Obstructing distal CBD stone.   Original Report Authenticated By: Osa Craver, M.D.    US Abdomen Complete  02/29/2012  *RADIOLOGY REPORT*  Clinical Data:  Abdominal pain.  Elevated liver enzymes.  Emesis.  COMPLETE ABDOMINAL ULTRASOUND  Comparison:  07/29/2007  Findings:  Gallbladder:  A 2.4 cm shadowing mobile gallstone is present the gallbladder.  No overt gallbladder wall thickening or pericholecystic fluid.  Sonographic Murphy's sign absent.  Common bile duct:  Measures 10 mm, considerably dilated.  No directly demonstrated choledocholithiasis.  Liver:  No focal lesion identified.  Within normal limits in parenchymal echogenicity.  IVC:  Appears normal.  Pancreas:  Pancreatic head and body appear normal.  Pancreatic tail obscured by bowel gas.  Spleen:  Measures 6.9 cm craniocaudad and appears normal.  Right Kidney:  Measures 11.5 cm in length and appears normal.  Left Kidney:  Measures 12.0 cm in length and appears normal.  Abdominal aorta:  No aneurysm identified.  IMPRESSION:  1.  Cholelithiasis. 2.  Dilated common bile duct at 10 mm diameter.  The CBD caliber appeared normal back on the CT scan of 07/29/2007.  We do not directly demonstrate choledocholithiasis, and no definite intrahepatic biliary dilatation is currently observed.   Original Report Authenticated By: Dellia Cloud, M.D.     ROS:  As stated above in the HPI otherwise negative.  Blood pressure 131/74, pulse 68, temperature 98.1 F (36.7 C), temperature source Oral, resp. rate 13, height 5\' 2"  (1.575 m), weight 117.482 kg (259 lb), last menstrual period 02/27/2012, SpO2 97.00%, not currently breastfeeding.    PE: Gen: NAD, Alert and Oriented HEENT:  [place]/AT, EOMI Neck: Supple, no LAD Lungs: CTA Bilaterally CV: RRR without M/G/R ABM: Soft, tender at the incision sites,  +BS Ext: No C/C/E  Assessment/Plan: 1) Choledocholithiasis. 2) Von Willebrand's Disease.  Plan: 1) ERCP with stone extraction tomorrow.  I discussed the risks of bleeding, perforation, and pancreatitis with the patient.  I will order DDAVP for her before the procedure.  Hoa Deriso D 03/02/2012, 5:17 PM

## 2012-03-02 NOTE — Preoperative (Signed)
Beta Blockers   Reason not to administer Beta Blockers:Not Applicable 

## 2012-03-02 NOTE — Transfer of Care (Signed)
Immediate Anesthesia Transfer of Care Note  Patient: Ann [name]  Procedure(s) Performed: Procedure(s) (LRB) with comments: LAPAROSCOPIC CHOLECYSTECTOMY WITH INTRAOPERATIVE CHOLANGIOGRAM (N/A)  Patient Location: PACU  Anesthesia Type: General  Level of Consciousness: awake, sedated and patient cooperative  Airway & Oxygen Therapy: Patient Spontanous Breathing and Patient connected to nasal cannula oxygen  Post-op Assessment: Report given to PACU RN and Post -op Vital signs reviewed and stable  Post vital signs: Reviewed and stable  Complications: No apparent anesthesia complications

## 2012-03-02 NOTE — Anesthesia Postprocedure Evaluation (Signed)
Anesthesia Post Note  Patient: Ann [name]  Procedure(s) Performed: Procedure(s) (LRB): LAPAROSCOPIC CHOLECYSTECTOMY WITH INTRAOPERATIVE CHOLANGIOGRAM (N/A)  Anesthesia type: general  Patient location: PACU  Post pain: Pain level controlled  Post assessment: Patient's Cardiovascular Status Stable  Last Vitals:  Filed Vitals:   03/02/12 1735  BP:   Pulse: 69  Temp:   Resp: 12    Post vital signs: Reviewed and stable  Level of consciousness: sedated  Complications: No apparent anesthesia complications

## 2012-03-02 NOTE — Progress Notes (Signed)
Instructed to wait 15 min and bring pt to her rm.

## 2012-03-03 ENCOUNTER — Inpatient Hospital Stay (HOSPITAL_COMMUNITY): Payer: 59

## 2012-03-03 ENCOUNTER — Encounter (HOSPITAL_COMMUNITY): Admission: EM | Disposition: A | Discharge status: Home / Self Care | Payer: Self-pay | Source: Home / Self Care

## 2012-03-03 ENCOUNTER — Encounter (HOSPITAL_COMMUNITY): Payer: Self-pay

## 2012-03-03 HISTORY — PX: ERCP: SHX5425

## 2012-03-03 LAB — COMPREHENSIVE METABOLIC PANEL
ALT: 328 U/L — ABNORMAL HIGH (ref 0–35)
Albumin: 3.9 g/dL (ref 3.5–5.2)
Calcium: 9.5 mg/dL (ref 8.4–10.5)
GFR calc Af Amer: >90 mL/min (ref >90)
Glucose, Bld: 129 mg/dL — ABNORMAL HIGH (ref 70–99)
Sodium: 126 mEq/L — ABNORMAL LOW (ref 135–145)
Total Protein: 7.6 g/dL (ref 6.0–8.3)

## 2012-03-03 LAB — CBC
HCT: 32.8 % — ABNORMAL LOW (ref 36.0–46.0)
Hemoglobin: 10.5 g/dL — ABNORMAL LOW (ref 12.0–15.0)
MCHC: 32 g/dL (ref 30.0–36.0)

## 2012-03-03 SURGERY — ERCP, WITH INTERVENTION IF INDICATED
Anesthesia: Moderate Sedation

## 2012-03-03 MED ORDER — SODIUM CHLORIDE 0.9 % IV SOLN
INTRAVENOUS | Status: DC | PRN
  Administered 2012-03-03: 16:00:00

## 2012-03-03 MED ORDER — OXYCODONE-ACETAMINOPHEN 5-325 MG PO TABS
1.0000 | ORAL_TABLET | ORAL | Status: DC | PRN
  Administered 2012-03-04 – 2012-03-07 (×5): 2 via ORAL
  Filled 2012-03-03 (×5): qty 2

## 2012-03-03 MED ORDER — BUTAMBEN-TETRACAINE-BENZOCAINE 2-2-14 % EX AERO
INHALATION_SPRAY | CUTANEOUS | Status: DC | PRN
  Administered 2012-03-03: 2 via TOPICAL

## 2012-03-03 MED ORDER — MIDAZOLAM HCL 10 MG/2ML IJ SOLN
INTRAMUSCULAR | Status: DC | PRN
  Administered 2012-03-03 (×3): 2 mg via INTRAVENOUS

## 2012-03-03 MED ORDER — MIDAZOLAM HCL 5 MG/ML IJ SOLN
INTRAMUSCULAR | Status: AC
  Filled 2012-03-03: qty 4

## 2012-03-03 MED ORDER — DESMOPRESSIN ACETATE 4 MCG/ML IJ SOLN
0.3000 ug/kg | Freq: Two times a day (BID) | INTRAMUSCULAR | Status: DC
  Administered 2012-03-04 (×2): 35.2 ug via INTRAVENOUS
  Filled 2012-03-03 (×5): qty 8.8

## 2012-03-03 MED ORDER — FENTANYL CITRATE 0.05 MG/ML IJ SOLN
INTRAMUSCULAR | Status: AC
  Filled 2012-03-03: qty 4

## 2012-03-03 MED ORDER — FENTANYL CITRATE 0.05 MG/ML IJ SOLN
INTRAMUSCULAR | Status: DC | PRN
  Administered 2012-03-03 (×3): 25 ug via INTRAVENOUS

## 2012-03-03 MED ORDER — DIPHENHYDRAMINE HCL 50 MG/ML IJ SOLN
INTRAMUSCULAR | Status: AC
  Filled 2012-03-03: qty 1

## 2012-03-03 MED ORDER — KETOROLAC TROMETHAMINE 30 MG/ML IJ SOLN
30.0000 mg | Freq: Four times a day (QID) | INTRAMUSCULAR | Status: DC | PRN
  Administered 2012-03-04 – 2012-03-06 (×5): 30 mg via INTRAVENOUS
  Filled 2012-03-03 (×7): qty 1

## 2012-03-03 MED ORDER — DIPHENHYDRAMINE HCL 50 MG/ML IJ SOLN
INTRAMUSCULAR | Status: DC | PRN
  Administered 2012-03-03: 25 mg via INTRAVENOUS

## 2012-03-03 MED ORDER — OXYCODONE-ACETAMINOPHEN 5-325 MG PO TABS: 2.0000 | ORAL_TABLET | ORAL | Status: DC | PRN

## 2012-03-03 MED ORDER — GLUCAGON HCL (RDNA) 1 MG IJ SOLR
INTRAMUSCULAR | Status: AC
  Filled 2012-03-03: qty 2

## 2012-03-03 NOTE — Op Note (Signed)
[facility] [address] [place], [address]   ERCP PROCEDURE REPORT  PATIENT: Ann [name], [name]  MR# :[id number] BIRTHDATE: December 22, 1980  GENDER: Female ENDOSCOPIST: Jeani Hawking, MD REFERRED BY: Cyndia Bent, M.D. PROCEDURE DATE:  03/03/2012 PROCEDURE:   ERCP with removal of calculus/calculi ASA CLASS:   Class III INDICATIONS:established bile duct stone(s). MEDICATIONS: Versed 6 mg IV, Fentanyl 75 mcg IV, and Benadryl 25 mg IV TOPICAL ANESTHETIC: none  DESCRIPTION OF PROCEDURE:   After the risks benefits and alternatives of the procedure were thoroughly explained, informed consent was obtained.  The     endoscope was introduced through the mouth  and advanced to the second portion of the duodenum .The amplla was spontaneously draining bile.  On the second attempt the CBD was able to be cannulated and the guidwire was secured in the right intrahepatic ducts.  Contrast injection revealed a dilated CBD at approximately 1-1.2 cm.  No cystic duct leak identified. There was a small lucency in the distal CBD.  A 1 cm sphincterotomy was created.  No bleeding was noted and the patient did receive preop DDAVP.  With the initial balloon sweep of the CBD the stone was extracted.  The CBD was swepted 3 more times and no further stones were removed.  A final occlusion cholangiogram was performed and there was no evidence of any retained stones. The scope was then completely withdrawn from the patient and the procedure terminated.     COMPLICATIONS: .  There were no complications.  ENDOSCOPIC IMPRESSION: 1) CBD stone s/p successful extraction.Marland Kitchen  RECOMMENDATIONS: 1) Routine post-op care.     _______________________________ eSignedJeani Hawking, MD 03/03/2012 3:45 PM   [id number] Jamey Ripa, MD

## 2012-03-03 NOTE — Progress Notes (Signed)
Patient ID: Ann [name], female   DOB: [birth year], 31 y.o.   MRN: [id number] 1 Day Post-Op  Subjective: Pt still with quite severe epigastric abdominal pain.  Not much soreness from surgery.  No nausea  Objective: Vital signs in last 24 hours: Temp:  [97.7 F (36.5 C)-99.5 F (37.5 C)] 97.7 F (36.5 C) (10/25 1019) Pulse Rate:  [66-93] 67  (10/25 1019) Resp:  [12-20] 20  (10/25 1019) BP: (115-143)/(58-90) 123/90 mmHg (10/25 1019) SpO2:  [94 %-100 %] 97 % (10/25 1019) Last BM Date: 02/29/12  Intake/Output from previous day: 10/24 0701 - 10/25 0700 In: 6131.3 [I.V.:5331.3; IV Piggyback:800] Out: 100 [Blood:100] Intake/Output this shift:    PE: Abd: soft, tender appropriately, +BS, ND, obese, incisions c/d/i  Lab Results:   Basename 03/03/12 0555 03/01/12 0650  WBC 11.6* 8.2  HGB 10.5* 10.7*  HCT 32.8* 33.6*  PLT 332 326   BMET  Basename 03/03/12 0555 03/02/12 0557  NA 126* 140  K 4.8 4.2  CL 92* 103  CO2 25 28  GLUCOSE 129* 104*  BUN 4* 4*  CREATININE 0.60 0.82  CALCIUM 9.5 9.2   PT/INR No results found for this basename: LABPROT:2,INR:2 in the last 72 hours CMP     Component Value Date/Time   NA 126* 03/03/2012 0555   K 4.8 03/03/2012 0555   CL 92* 03/03/2012 0555   CO2 25 03/03/2012 0555   GLUCOSE 129* 03/03/2012 0555   BUN 4* 03/03/2012 0555   CREATININE 0.60 03/03/2012 0555   CREATININE 0.59 09/07/2011 1801   CALCIUM 9.5 03/03/2012 0555   PROT 7.6 03/03/2012 0555   ALBUMIN 3.9 03/03/2012 0555   AST 362* 03/03/2012 0555   ALT 328* 03/03/2012 0555   ALKPHOS 196* 03/03/2012 0555   BILITOT 2.6* 03/03/2012 0555   GFRNONAA >90 03/03/2012 0555   GFRAA >90 03/03/2012 0555   Lipase     Component Value Date/Time   LIPASE 10* 03/03/2012 0555       Studies/Results: Dg Cholangiogram Operative  03/02/2012  *RADIOLOGY REPORT*  Clinical Data:   Cholelithiasis  INTRAOPERATIVE CHOLANGIOGRAM  Technique:  Cholangiographic images from the C-arm  fluoroscopic device were submitted for interpretation post-operatively.  Please see the procedural report for the amount of contrast and the fluoroscopy time utilized.  Comparison:  None  Findings: Persistent filling defect in the distal common bile duct near the ampulla with a meniscus.  The proximal common duct is mildly distended. Intrahepatic biliary ducts are incompletely visualized, grossly unremarkable.  There is no evident passage of contrast material into the duodenum.  IMPRESSION 1.  Obstructing distal CBD stone.   Original Report Authenticated By: Osa Craver, M.D.     Anti-infectives: Anti-infectives     Start     Dose/Rate Route Frequency Ordered Stop   03/01/12 0100   ciprofloxacin (CIPRO) IVPB 400 mg        400 mg 200 mL/hr over 60 Minutes Intravenous Every 12 hours 03/01/12 0030             Assessment/Plan  1. S/p lap chole 2. Choledocholithiasis 3. Von Willebrands  Plan: 1. Patient will get BID DDAVP while here and once at home for 5 days.  The end date is Monday.  I have discussed this with the patient and she understands what she is supposed to do with her Stimate nasal spray at home once discharged. 2. ERCP today for CBD stone 3. Can likely advance diet after procedure today  LOS: 3 days    Madelaine Whipple E 03/03/2012, 10:23 AM Pager: [phone]

## 2012-03-03 NOTE — Progress Notes (Signed)
Agree with A&P of KO,PA. Patient stable post op. Discussed with Dr Elnoria Howard and he plans ercp today

## 2012-03-03 NOTE — Progress Notes (Signed)
Pharmacy made aware to schedule DDAVP 12 hours after her last dose/MAR.

## 2012-03-03 NOTE — Progress Notes (Signed)
Received from endo post ERCP, alert and oriented, VSS, will continue to monitor.

## 2012-03-03 NOTE — Interval H&P Note (Signed)
History and Physical Interval Note:  03/03/2012 2:42 PM  Ann [name]  has presented today for surgery, with the diagnosis of CBD stone  The various methods of treatment have been discussed with the patient and family. After consideration of risks, benefits and other options for treatment, the patient has consented to  Procedure(s) (LRB) with comments: ENDOSCOPIC RETROGRADE CHOLANGIOPANCREATOGRAPHY (ERCP) (N/A) - dl/[name] as a surgical intervention .  The patient's history has been reviewed, patient examined, no change in status, stable for surgery.  I have reviewed the patient's chart and labs.  Questions were answered to the patient's satisfaction.     [name]

## 2012-03-04 DIAGNOSIS — E871 Hypo-osmolality and hyponatremia: Secondary | ICD-10-CM

## 2012-03-04 DIAGNOSIS — D680 Von Willebrand's disease: Secondary | ICD-10-CM

## 2012-03-04 LAB — COMPREHENSIVE METABOLIC PANEL
ALT: 284 U/L — ABNORMAL HIGH (ref 0–35)
Alkaline Phosphatase: 181 U/L — ABNORMAL HIGH (ref 39–117)
CO2: 24 mEq/L (ref 19–32)
GFR calc Af Amer: >90 mL/min (ref >90)
GFR calc non Af Amer: >90 mL/min (ref >90)
Glucose, Bld: 111 mg/dL — ABNORMAL HIGH (ref 70–99)
Potassium: 4.2 mEq/L (ref 3.5–5.1)
Sodium: 121 mEq/L — ABNORMAL LOW (ref 135–145)

## 2012-03-04 LAB — BASIC METABOLIC PANEL
BUN: 6 mg/dL (ref 6–23)
CO2: 23 mEq/L (ref 19–32)
Calcium: 8.5 mg/dL (ref 8.4–10.5)
Calcium: 8.8 mg/dL (ref 8.4–10.5)
Creatinine, Ser: 0.53 mg/dL (ref 0.50–1.10)
GFR calc Af Amer: >90 mL/min (ref >90)
Glucose, Bld: 85 mg/dL (ref 70–99)
Sodium: 118 mEq/L — CL (ref 135–145)

## 2012-03-04 MED ORDER — KCL IN DEXTROSE-NACL 20-5-0.9 MEQ/L-%-% IV SOLN
INTRAVENOUS | Status: DC
  Administered 2012-03-04: 12:00:00 via INTRAVENOUS
  Filled 2012-03-04 (×2): qty 1000

## 2012-03-04 NOTE — Consult Note (Signed)
[facility] Medical Consultation  Ann [name] UUV:[id number] DOB: December 24, 1980 DOA: 02/29/2012 PCP: Pecola Lawless, MD   Requesting physician: Gaynelle Adu   Date of consultation: March 04, 2012 Reason for consultation: Hyponatremia  Impression/Recommendations   1. Hyponatremia in the setting of DDAVP use for cholecystectomy. The patient was admitted on October 22 with a sodium of 137. Initially she remained with an elevated sodium of 141 and 140. By March 03, 2012 she starting developing hyponatremia with a sodium of 126. Today, October 26 she was noted to have a sodium level of 121 which continued to decrease to a sodium level of 117 at 4 PM today. The patient seems to be asymptomatic. Specifically she does not have a headache and she is not confused. She does have a mild abdominal pain which I suspect is related to her surgical interventions. I recommend water restriction, discontinuation of IV fluids and to agree with stopping DDAVP. Most likely her sodium will slowly correct. Currently I would be more worried about problems related to rapid correction so I will not start 3% saline drip. If through the night she starts correcting too fast we will liberalize oral fluids or alternatively we can do a 3% saline drip and continued the DDAVP. She should not correct faster the 10 mEq in 24 hours.  I will followup again tomorrow. Please contact me if I can be of assistance in the meanwhile. Thank you for this consultation.  Chief Complaint: low sodium   HPI:  31 year old patient admitted for cholecystitis. She underwent laparoscopic cholecystectomy on October 24 and she also underwent ERCP on October 25.   Review of Systems:  As per history of present illness, specifically negative for headaches, nausea, vomiting, severe abdominal pain. All other systems reviewed and negative  Past Medical History  Diagnosis Date  . Trichomonas   . Von Willebrand disease   . HSV-2 infection     NO  RECENT OUTBREAKS  . Anemia   . Migraine   . Depression   . Abnormal Pap smear 2010    colpo  . H/O varicella   . H/O cystitis   . H/O pyelonephritis     frequently during childhood  . Alcoholic     daily  . Asthma     unable to find inhaler  . Pyelonephritis     last episode "years ago"  . Blood dyscrasia     last seen by Dr Cyndie Chime 59yrs ago   Past Surgical History  Procedure Date  . Back surgery     4 back surgeries  . Laparoscopy 2011  . Wisdom tooth extraction 2001  . Dilation and curettage of uterus   . Cesarean section 2004  2013    2 layer closure  . Multiple tooth extractions    Social History:  reports that she quit smoking about 11 years ago. She has never used smokeless tobacco. She reports that she drinks alcohol. She reports that she does not use illicit drugs.  Allergies  Allergen Reactions  . Darvocet (Propoxyphene-Acetaminophen) Nausea And Vomiting  . Codeine Nausea And Vomiting and Rash  . Sulfa Antibiotics Rash   Family History  Problem Relation Age of Onset  . Stroke Mother   . Arthritis Mother   . Depression Mother   . COPD Father   . Kidney disease Paternal Uncle   . Stroke Maternal Grandfather   . Heart disease Paternal Grandfather     Prior to Admission medications   Medication Sig Start Date End  Date Taking? Authorizing Provider  atropine 1 % ophthalmic solution Place 1 drop into the right eye daily. Floaters in vision and blurred vision   Yes Historical Provider, MD  ibuprofen (ADVIL,MOTRIN) 200 MG tablet Take 600 mg by mouth every 6 (six) hours as needed. For pain   Yes Historical Provider, MD  ondansetron (ZOFRAN) 4 MG tablet Take 1 tablet (4 mg total) by mouth every 6 (six) hours. 02/26/12  Yes Daleen Bo, MD  prednisoLONE acetate (PRED FORTE) 1 % ophthalmic suspension Place 1 drop into the right eye 4 (four) times daily. Floater and blurred vision in right eye   Yes Historical Provider, MD  desmopressin (STIMATE) 1.5 MG/ML SOLN  Place 1 spray into the nose as needed. Before minor surgery    Historical Provider, MD  HYDROcodone-acetaminophen (VICODIN) 5-500 MG per tablet Take 1-2 tablets by mouth every 4 (four) hours as needed. Dental surgery on 10/18    Historical Provider, MD   Physical Exam: Blood pressure 119/59, pulse 67, temperature 97.9 F (36.6 C), temperature source Oral, resp. rate 20, height 5\' 2"  (1.575 m), weight 117.482 kg (259 lb), last menstrual period 02/27/2012, SpO2 97.00%, not currently breastfeeding. Filed Vitals:   03/03/12 2152 03/04/12 0204 03/04/12 0613 03/04/12 1413  BP: 130/80 99/50 113/64 119/59  Pulse: 75 75 83 67  Temp: 97.8 F (36.6 C) 98.5 F (36.9 C) 98.2 F (36.8 C) 97.9 F (36.6 C)  TempSrc: Oral Oral Oral Oral  Resp: 20 18 18 20   Height:      Weight:      SpO2: 99% 99% 99% 97%     General:  Alert and oriented x3  Eyes: Pupil equal round react to light accommodation, extraocular movements intact  ENT: Clear throat without pharyngeal exudate  Neck: No jugular venous distention  Cardiovascular: Regular rate and rhythm without murmurs rubs or gallops  Respiratory:  Clear to auscultation bilaterally without wheeze rhonchi crackles  Abdomen: Minimal tenderness, bowel sounds present  Skin: Warm dry no rashes  Musculoskeletal: Intact muscle bulk and tone  Psychiatric: Euthymic  Neurologic: Cranial nerves 2-12 intact, strength 5 out of 5 in all 4 extremities, sensation intact  Labs on Admission:  Basic Metabolic Panel:  Lab 03/04/12 1610 03/04/12 0615 03/03/12 0555 03/02/12 0557 03/01/12 0650  NA 117* 121* 126* 140 140  K 3.9 4.2 4.8 4.2 3.5  CL 85* 90* 92* 103 103  CO2 24 24 25 28 28   GLUCOSE 105* 111* 129* 104* 113*  BUN 7 7 4* 4* 6  CREATININE 0.53 0.58 0.60 0.82 0.77  CALCIUM 8.5 8.4 9.5 9.2 8.8  MG -- -- -- -- --  PHOS -- -- -- -- --   Liver Function Tests:  Lab 03/04/12 0615 03/03/12 0555 03/02/12 0557 03/01/12 0650 02/29/12 1520  AST 171* 362*  57* 106* 242*  ALT 284* 328* 200* 278* 410*  ALKPHOS 181* 196* 140* 150* 181*  BILITOT 0.9 2.6* 0.6 1.0 2.9*  PROT 6.8 7.6 7.2 7.0 7.8  ALBUMIN 3.4* 3.9 3.6 3.5 4.0    Lab 03/03/12 0555 02/29/12 1520  LIPASE 10* 13  AMYLASE -- --   No results found for this basename: AMMONIA:5 in the last 168 hours CBC:  Lab 03/03/12 0555 03/01/12 0650 02/29/12 1543 02/29/12 1520  WBC 11.6* 8.2 -- 8.4  NEUTROABS -- -- -- --  HGB 10.5* 10.7* 12.6 11.6*  HCT 32.8* 33.6* 37.0 35.4*  MCV 90.1 90.3 -- 88.9  PLT 332 326 -- 358  Cardiac Enzymes: No results found for this basename: CKTOTAL:5,CKMB:5,CKMBINDEX:5,TROPONINI:5 in the last 168 hours BNP: No components found with this basename: POCBNP:5 CBG: No results found for this basename: GLUCAP:5 in the last 168 hours  Radiological Exams on Admission: Dg Ercp With Sphincterotomy  03/03/2012  *RADIOLOGY REPORT*  Clinical Data: The common bile duct than  ERCP with sphincterotomy.  Comparison:  Intraoperative cholangiogram 10/24 1013.  Technique:  Multiple spot images obtained with the fluoroscopic device and submitted for interpretation post-procedure.  ERCP was performed by Dr. the.  Findings: Three spot fluoroscopic images are provided.  Initial image demonstrates endoscope within the duodenum.  The common bile duct is cannulated following sphincterotomy.  A  balloon sweep extracts a single stone.  No evidence of cystic duct leak.  IMPRESSION: ERCP with sphincterotomy and stone extraction.  These images were submitted for radiologic interpretation only. Please see the procedural report for the amount of contrast and the fluoroscopy time utilized.   Original Report Authenticated By: Genevive Bi, M.D.      Time spent: 62  Brylee Mcgreal [facility] Pager [phone]  If 7PM-7AM, please contact night-coverage [email or web address] Password [facility] 03/04/2012, 7:13 PM

## 2012-03-04 NOTE — Progress Notes (Signed)
Lab called to report critical na lab of 117. Dr. Andrey Campanile on call for [facility], notified.

## 2012-03-04 NOTE — Progress Notes (Signed)
1 Day Post-Op   Assessment: s/p Procedure(s): ENDOSCOPIC RETROGRADE CHOLANGIOPANCREATOGRAPHY (ERCP) Patient Active Problem List  Diagnosis  . Von Willebrand disease - hx PPH  . History of abnormal Pap smear - had colpo  . Infertility, female  . HSV-2 infection - history, no outbreaks  . Anemia  . Asthma - uses inhaler PRN  . History of pyelonephritis - frequent history as a child  . Migraine  . History of depression  . History of smoking  . Morbid obesity  . Acute cholecystitis    Clinically improved s/p Lap chole and ercp/stone retreival Hyponatremia, likely secondary to dapt  Plan: Advance diet Slow IV and change to N/S. Recheck BMET this PM. Might be able to go home if Na nornalizes, but likley here until tomorrow  Subjective: Feels better, still sore, no nausea, wants solid food.  Objective: Vital signs in last 24 hours: Temp:  [97.8 F (36.6 C)-99.1 F (37.3 C)] 98.2 F (36.8 C) (10/26 1610) Pulse Rate:  [75-83] 83  (10/26 0613) Resp:  [12-20] 18  (10/26 0613) BP: (99-215)/(50-150) 113/64 mmHg (10/26 0613) SpO2:  [99 %-100 %] 99 % (10/26 9604)   Intake/Output from previous day: 10/25 0701 - 10/26 0700 In: 3362.5 [I.V.:2912.5; IV Piggyback:450] Out: -  Intake/Output this shift:     General appearance: alert, cooperative and no distress Resp: clear to auscultation bilaterally GI: soft, non-tender; bowel sounds normal; no masses,  no organomegaly  Incision: healing well  Lab Results:   Basename 03/03/12 0555  WBC 11.6*  HGB 10.5*  HCT 32.8*  PLT 332   BMET  Basename 03/04/12 0615 03/03/12 0555  NA 121* 126*  K 4.2 4.8  CL 90* 92*  CO2 24 25  GLUCOSE 111* 129*  BUN 7 4*  CREATININE 0.58 0.60  CALCIUM 8.4 9.5   PT/INR No results found for this basename: LABPROT:2,INR:2 in the last 72 hours ABG No results found for this basename: PHART:2,PCO2:2,PO2:2,HCO3:2 in the last 72 hours  MEDS, Scheduled    . atropine  1 drop Right Eye Daily    . chlorhexidine  15 mL Mouth Rinse BID  . ciprofloxacin  400 mg Intravenous Q12H  . desmopressin (DDAVP) IV  0.3 mcg/kg Intravenous To Endo  . desmopressin (DDAVP) IV  0.3 mcg/kg Intravenous Q12H  . pantoprazole (PROTONIX) IV  40 mg Intravenous QHS  . prednisoLONE acetate  1 drop Right Eye QID    Studies/Results: Dg Cholangiogram Operative  03/02/2012  *RADIOLOGY REPORT*  Clinical Data:   Cholelithiasis  INTRAOPERATIVE CHOLANGIOGRAM  Technique:  Cholangiographic images from the C-arm fluoroscopic device were submitted for interpretation post-operatively.  Please see the procedural report for the amount of contrast and the fluoroscopy time utilized.  Comparison:  None  Findings: Persistent filling defect in the distal common bile duct near the ampulla with a meniscus.  The proximal common duct is mildly distended. Intrahepatic biliary ducts are incompletely visualized, grossly unremarkable.  There is no evident passage of contrast material into the duodenum.  IMPRESSION 1.  Obstructing distal CBD stone.   Original Report Authenticated By: Osa Craver, M.D.    Dg Ercp With Sphincterotomy  03/03/2012  *RADIOLOGY REPORT*  Clinical Data: The common bile duct than  ERCP with sphincterotomy.  Comparison:  Intraoperative cholangiogram 10/24 1013.  Technique:  Multiple spot images obtained with the fluoroscopic device and submitted for interpretation post-procedure.  ERCP was performed by Dr. the.  Findings: Three spot fluoroscopic images are provided.  Initial image demonstrates  endoscope within the duodenum.  The common bile duct is cannulated following sphincterotomy.  A  balloon sweep extracts a single stone.  No evidence of cystic duct leak.  IMPRESSION: ERCP with sphincterotomy and stone extraction.  These images were submitted for radiologic interpretation only. Please see the procedural report for the amount of contrast and the fluoroscopy time utilized.   Original Report Authenticated By:  Genevive Bi, M.D.       LOS: 4 days     Currie Paris, MD, [facility], [place] [phone]   03/04/2012 10:23 AM

## 2012-03-04 NOTE — Progress Notes (Signed)
Patient ID: Ann [name], female   DOB: 02/18/1981, 31 y.o.   MRN: [id number] Pt has serum Na of 117 now. On DDVAP because of h/o vWF disease. Will hold DDAVP for now. Place on neuro checks. Consult hospitalist for hypoNatremia.  Mary Sella. Andrey Campanile, MD, FACS General, Bariatric, & Minimally Invasive Surgery [facility], [place]

## 2012-03-05 DIAGNOSIS — K819 Cholecystitis, unspecified: Secondary | ICD-10-CM

## 2012-03-05 LAB — OSMOLALITY: Osmolality: 250 mOsm/kg — ABNORMAL LOW (ref 275–300)

## 2012-03-05 LAB — BASIC METABOLIC PANEL
BUN: 5 mg/dL — ABNORMAL LOW (ref 6–23)
BUN: 5 mg/dL — ABNORMAL LOW (ref 6–23)
CO2: 22 mEq/L (ref 19–32)
CO2: 23 mEq/L (ref 19–32)
CO2: 24 mEq/L (ref 19–32)
Calcium: 8.6 mg/dL (ref 8.4–10.5)
Calcium: 9 mg/dL (ref 8.4–10.5)
Calcium: 9.6 mg/dL (ref 8.4–10.5)
Chloride: 103 mEq/L (ref 96–112)
Chloride: 82 mEq/L — ABNORMAL LOW (ref 96–112)
Chloride: 83 mEq/L — ABNORMAL LOW (ref 96–112)
Creatinine, Ser: 0.46 mg/dL — ABNORMAL LOW (ref 0.50–1.10)
Creatinine, Ser: 0.57 mg/dL (ref 0.50–1.10)
Creatinine, Ser: 0.49 mg/dL — ABNORMAL LOW (ref 0.50–1.10)
Creatinine, Ser: 0.76 mg/dL (ref 0.50–1.10)
GFR calc non Af Amer: >90 mL/min (ref >90)
Glucose, Bld: 95 mg/dL (ref 70–99)
Glucose, Bld: 103 mg/dL — ABNORMAL HIGH (ref 70–99)
Glucose, Bld: 107 mg/dL — ABNORMAL HIGH (ref 70–99)
Potassium: 3.8 mEq/L (ref 3.5–5.1)

## 2012-03-05 MED ORDER — SODIUM CHLORIDE 3 % IV SOLN
INTRAVENOUS | Status: DC
  Administered 2012-03-05 – 2012-03-06 (×2): via INTRAVENOUS
  Filled 2012-03-05 (×3): qty 500

## 2012-03-05 MED ORDER — SODIUM CHLORIDE 3 % IV SOLN: INTRAVENOUS | Status: DC

## 2012-03-05 MED ORDER — SODIUM CHLORIDE 0.9 % IJ SOLN
10.0000 mL | Freq: Two times a day (BID) | INTRAMUSCULAR | Status: DC
  Administered 2012-03-05: 10 mL
  Administered 2012-03-06: 20 mL
  Administered 2012-03-06: 10 mL

## 2012-03-05 MED ORDER — SODIUM CHLORIDE 0.9 % IJ SOLN: 10.0000 mL | INTRAMUSCULAR | Status: DC | PRN

## 2012-03-05 MED ORDER — PANTOPRAZOLE SODIUM 40 MG PO TBEC
40.0000 mg | DELAYED_RELEASE_TABLET | Freq: Every day | ORAL | Status: DC
  Administered 2012-03-05 – 2012-03-07 (×3): 40 mg via ORAL
  Filled 2012-03-05 (×3): qty 1

## 2012-03-05 NOTE — Progress Notes (Signed)
2 Days Post-Op  Subjective: No complaints  Objective: Vital signs in last 24 hours: Temp:  [97.9 F (36.6 C)-98.4 F (36.9 C)] 98.2 F (36.8 C) (10/27 0846) Pulse Rate:  [62-69] 69  (10/27 0846) Resp:  [15-20] 15  (10/27 0846) BP: (101-132)/(52-76) 132/76 mmHg (10/27 0800) SpO2:  [97 %-100 %] 100 % (10/27 0846) Weight:  [266 lb 15.6 oz (121.1 kg)] 266 lb 15.6 oz (121.1 kg) (10/27 0846) Last BM Date: 02/29/12  Intake/Output from previous day: 10/26 0701 - 10/27 0700 In: 1450.7 [P.O.:1200; I.V.:0.7; IV Piggyback:250] Out: -  Intake/Output this shift:    Incision/Wound:clean dry intact  Lab Results:   Basename 03/03/12 0555  WBC 11.6*  HGB 10.5*  HCT 32.8*  PLT 332   BMET  Basename 03/05/12 0430 03/04/12 2032  NA 115* 118*  K 4.0 4.2  CL 82* 86*  CO2 23 23  GLUCOSE 95 85  BUN 6 6  CREATININE 0.57 0.54  CALCIUM 9.0 8.8   PT/INR No results found for this basename: LABPROT:2,INR:2 in the last 72 hours ABG No results found for this basename: PHART:2,PCO2:2,PO2:2,HCO3:2 in the last 72 hours  Studies/Results: Dg Ercp With Sphincterotomy  03/03/2012  *RADIOLOGY REPORT*  Clinical Data: The common bile duct than  ERCP with sphincterotomy.  Comparison:  Intraoperative cholangiogram 10/24 1013.  Technique:  Multiple spot images obtained with the fluoroscopic device and submitted for interpretation post-procedure.  ERCP was performed by Dr. the.  Findings: Three spot fluoroscopic images are provided.  Initial image demonstrates endoscope within the duodenum.  The common bile duct is cannulated following sphincterotomy.  A  balloon sweep extracts a single stone.  No evidence of cystic duct leak.  IMPRESSION: ERCP with sphincterotomy and stone extraction.  These images were submitted for radiologic interpretation only. Please see the procedural report for the amount of contrast and the fluoroscopy time utilized.   Original Report Authenticated By: Genevive Bi, M.D.      Anti-infectives: Anti-infectives     Start     Dose/Rate Route Frequency Ordered Stop   03/01/12 0100   ciprofloxacin (CIPRO) IVPB 400 mg        400 mg 200 mL/hr over 60 Minutes Intravenous Every 12 hours 03/01/12 0030            Assessment/Plan: s/p Procedure(s) (LRB) with comments: ENDOSCOPIC RETROGRADE CHOLANGIOPANCREATOGRAPHY (ERCP) (N/A) - dl/hung Hyponatremia   Medicine following.  Asymptomatic. Continue to follow.  No surgical issues.    LOS: 5 days    Miroslav Gin A. 03/05/2012

## 2012-03-05 NOTE — Progress Notes (Signed)
[facility] PROGRESS NOTE  Ann [name] WRU:[id number] DOB: 06/21/1980 DOA: 02/29/2012 PCP: Pecola Lawless, MD  Assessment/Plan: Principal Problem:  *Acute cholecystitis Active Problems:  Von Willebrand disease - hx PPH  Hyponatremia     Hyponatremia in the setting of DDAVP use for Von Willebrand disease  Prior to cholecystectomy. The patient was admitted on October 22 with a sodium of 137. Initially she remained with an elevated sodium of 141 and 140. By March 03, 2012 she starting developing hyponatremia with a sodium of 126. Today, October 26 she was noted to have a sodium level of 121 which continued to decrease to a sodium level of 117 at 4 PM today. The patient seems to be asymptomatic. Specifically she does not have a headache and she is not confused. She does have a mild abdominal pain which I suspect is related to her surgical interventions. I recommend water restriction, discontinuation of IV fluids and to agree with stopping DDAVP. Most likely her sodium will slowly correct. DC DDAVP, FLUID RESTRICTION, SODIUM STILL FALLING , SO START 3% SALINE   Code Status: full Family Communication: family updated about patient's clinical progress Disposition Plan:  As above        HPI/Subjective: [place] , no complaints   Objective: Filed Vitals:   03/04/12 2221 03/05/12 0627 03/05/12 0800 03/05/12 0846  BP: 101/57 106/52 132/76   Pulse: 69 62 67 69  Temp: 98.4 F (36.9 C) 97.9 F (36.6 C) 98.3 F (36.8 C) 98.2 F (36.8 C)  TempSrc: Oral Oral Oral Oral  Resp: 18 18 18 15   Height:    5\' 3"  (1.6 m)  Weight:    121.1 kg (266 lb 15.6 oz)  SpO2: 99% 98% 99% 100%    Intake/Output Summary (Last 24 hours) at 03/05/12 1136 Last data filed at 03/04/12 1830  Gross per 24 hour  Intake 1090.67 ml  Output      0 ml  Net 1090.67 ml    Exam:  General: Alert and oriented x3  Eyes: Pupil equal round react to light accommodation, extraocular movements intact  ENT:  Clear throat without pharyngeal exudate  Neck: No jugular venous distention  Cardiovascular: Regular rate and rhythm without murmurs rubs or gallops  Respiratory: Clear to auscultation bilaterally without wheeze rhonchi crackles  Abdomen: Minimal tenderness, bowel sounds present  Skin: Warm dry no rashes  Musculoskeletal: Intact muscle bulk and tone  Psychiatric: Euthymic  Neurologic: Cranial nerves 2-12 intact, strength 5 out of 5 in all 4 extremities, sensation intact   Data Reviewed: Basic Metabolic Panel:  Lab 03/05/12 9147 03/05/12 0430 03/04/12 2032 03/04/12 1611 03/04/12 0615  NA 116* 115* 118* 117* 121*  K 3.8 4.0 4.2 3.9 4.2  CL 83* 82* 86* 85* 90*  CO2 24 23 23 24 24   GLUCOSE 103* 95 85 105* 111*  BUN 5* 6 6 7 7   CREATININE 0.49* 0.57 0.54 0.53 0.58  CALCIUM 9.0 9.0 8.8 8.5 8.4  MG -- -- -- -- --  PHOS -- -- -- -- --    Liver Function Tests:  Lab 03/04/12 0615 03/03/12 0555 03/02/12 0557 03/01/12 0650 02/29/12 1520  AST 171* 362* 57* 106* 242*  ALT 284* 328* 200* 278* 410*  ALKPHOS 181* 196* 140* 150* 181*  BILITOT 0.9 2.6* 0.6 1.0 2.9*  PROT 6.8 7.6 7.2 7.0 7.8  ALBUMIN 3.4* 3.9 3.6 3.5 4.0    Lab 03/03/12 0555 02/29/12 1520  LIPASE 10* 13  AMYLASE -- --   No  results found for this basename: AMMONIA:5 in the last 168 hours  CBC:  Lab 03/03/12 0555 03/01/12 0650 02/29/12 1543 02/29/12 1520  WBC 11.6* 8.2 -- 8.4  NEUTROABS -- -- -- --  HGB 10.5* 10.7* 12.6 11.6*  HCT 32.8* 33.6* 37.0 35.4*  MCV 90.1 90.3 -- 88.9  PLT 332 326 -- 358    Cardiac Enzymes: No results found for this basename: CKTOTAL:5,CKMB:5,CKMBINDEX:5,TROPONINI:5 in the last 168 hours BNP (last 3 results)  Basename 02/25/12 2153  PROBNP 93.1     CBG: No results found for this basename: GLUCAP:5 in the last 168 hours  Recent Results (from the past 240 hour(s))  SURGICAL PCR SCREEN     Status: Normal   Collection Time   03/02/12  2:05 AM      Component Value Range Status  Comment   MRSA, PCR NEGATIVE  NEGATIVE Final    Staphylococcus aureus NEGATIVE  NEGATIVE Final      Studies: Dg Chest 2 View  02/25/2012  *RADIOLOGY REPORT*  Clinical Data: Chest pain.  CHEST - 2 VIEW  Comparison: April 19, 2007.  Findings: Cardiomediastinal silhouette appears normal.  No acute pulmonary disease is noted.  Bony thorax is intact.  IMPRESSION: No acute cardiopulmonary abnormality seen.   Original Report Authenticated By: Venita Sheffield., M.D.    Dg Cholangiogram Operative  03/02/2012  *RADIOLOGY REPORT*  Clinical Data:   Cholelithiasis  INTRAOPERATIVE CHOLANGIOGRAM  Technique:  Cholangiographic images from the C-arm fluoroscopic device were submitted for interpretation post-operatively.  Please see the procedural report for the amount of contrast and the fluoroscopy time utilized.  Comparison:  None  Findings: Persistent filling defect in the distal common bile duct near the ampulla with a meniscus.  The proximal common duct is mildly distended. Intrahepatic biliary ducts are incompletely visualized, grossly unremarkable.  There is no evident passage of contrast material into the duodenum.  IMPRESSION 1.  Obstructing distal CBD stone.   Original Report Authenticated By: Osa Craver, M.D.    US Abdomen Complete  02/29/2012  *RADIOLOGY REPORT*  Clinical Data:  Abdominal pain.  Elevated liver enzymes.  Emesis.  COMPLETE ABDOMINAL ULTRASOUND  Comparison:  07/29/2007  Findings:  Gallbladder:  A 2.4 cm shadowing mobile gallstone is present the gallbladder.  No overt gallbladder wall thickening or pericholecystic fluid.  Sonographic Murphy's sign absent.  Common bile duct:  Measures 10 mm, considerably dilated.  No directly demonstrated choledocholithiasis.  Liver:  No focal lesion identified.  Within normal limits in parenchymal echogenicity.  IVC:  Appears normal.  Pancreas:  Pancreatic head and body appear normal.  Pancreatic tail obscured by bowel gas.  Spleen:  Measures  6.9 cm craniocaudad and appears normal.  Right Kidney:  Measures 11.5 cm in length and appears normal.  Left Kidney:  Measures 12.0 cm in length and appears normal.  Abdominal aorta:  No aneurysm identified.  IMPRESSION:  1.  Cholelithiasis. 2.  Dilated common bile duct at 10 mm diameter.  The CBD caliber appeared normal back on the CT scan of 07/29/2007.  We do not directly demonstrate choledocholithiasis, and no definite intrahepatic biliary dilatation is currently observed.   Original Report Authenticated By: Dellia Cloud, M.D.    Dg Ercp With Sphincterotomy  03/03/2012  *RADIOLOGY REPORT*  Clinical Data: The common bile duct than  ERCP with sphincterotomy.  Comparison:  Intraoperative cholangiogram 10/24 1013.  Technique:  Multiple spot images obtained with the fluoroscopic device and submitted for interpretation post-procedure.  ERCP was performed by Dr. the.  Findings: Three spot fluoroscopic images are provided.  Initial image demonstrates endoscope within the duodenum.  The common bile duct is cannulated following sphincterotomy.  A  balloon sweep extracts a single stone.  No evidence of cystic duct leak.  IMPRESSION: ERCP with sphincterotomy and stone extraction.  These images were submitted for radiologic interpretation only. Please see the procedural report for the amount of contrast and the fluoroscopy time utilized.   Original Report Authenticated By: Genevive Bi, M.D.     Scheduled Meds:   . atropine  1 drop Right Eye Daily  . chlorhexidine  15 mL Mouth Rinse BID  . ciprofloxacin  400 mg Intravenous Q12H  . pantoprazole  40 mg Oral Daily  . prednisoLONE acetate  1 drop Right Eye QID  . sodium chloride  10-40 mL Intracatheter Q12H  . DISCONTD: desmopressin (DDAVP) IV  0.3 mcg/kg Intravenous Q12H  . DISCONTD: pantoprazole (PROTONIX) IV  40 mg Intravenous QHS   Continuous Infusions:   . sodium chloride (hypertonic)    . DISCONTD: dextrose 5 % and 0.9 % NaCl with KCl 20  mEq/L 20 mL/hr at 03/04/12 1220  . DISCONTD: sodium chloride (hypertonic)      Principal Problem:  *Acute cholecystitis Active Problems:  Von Willebrand disease - hx PPH  Hyponatremia    Time spent: 40 minutes   [facility]  [facility] Pager [phone]. If 8PM-8AM, please contact night-coverage at [email or web address], password [facility] 03/05/2012, 11:36 AM  LOS: 5 days

## 2012-03-05 NOTE — Progress Notes (Signed)
Notifed Elray Mcgregor NP on call for [facility] that pt's Na+ this am is 115.

## 2012-03-05 NOTE — Progress Notes (Signed)
Peripherally Inserted Central Catheter/Midline Placement  The IV Nurse has discussed with the patient and/or persons authorized to consent for the patient, the purpose of this procedure and the potential benefits and risks involved with this procedure.  The benefits include less needle sticks, lab draws from the catheter and patient may be discharged home with the catheter.  Risks include, but not limited to, infection, bleeding, blood clot (thrombus formation), and puncture of an artery; nerve damage and irregular heat beat.  Alternatives to this procedure were also discussed.  PICC/Midline Placement Documentation        Ann [name] 03/05/2012, 10:12 AM

## 2012-03-06 ENCOUNTER — Encounter (HOSPITAL_COMMUNITY): Payer: Self-pay

## 2012-03-06 ENCOUNTER — Inpatient Hospital Stay (HOSPITAL_COMMUNITY): Payer: 59

## 2012-03-06 ENCOUNTER — Encounter (HOSPITAL_COMMUNITY): Payer: Self-pay | Admitting: Gastroenterology

## 2012-03-06 LAB — BASIC METABOLIC PANEL
BUN: 6 mg/dL (ref 6–23)
BUN: 7 mg/dL (ref 6–23)
CO2: 23 mEq/L (ref 19–32)
Calcium: 9.4 mg/dL (ref 8.4–10.5)
Calcium: 9.2 mg/dL (ref 8.4–10.5)
Calcium: 9.4 mg/dL (ref 8.4–10.5)
Creatinine, Ser: 0.89 mg/dL (ref 0.50–1.10)
Creatinine, Ser: 0.96 mg/dL (ref 0.50–1.10)
Creatinine, Ser: 0.77 mg/dL (ref 0.50–1.10)
GFR calc Af Amer: 89 mL/min — ABNORMAL LOW (ref >90)
GFR calc non Af Amer: 84 mL/min — ABNORMAL LOW (ref >90)
GFR calc non Af Amer: 77 mL/min — ABNORMAL LOW (ref >90)
Glucose, Bld: 123 mg/dL — ABNORMAL HIGH (ref 70–99)

## 2012-03-06 MED ORDER — SODIUM CHLORIDE 0.9 % IV SOLN: INTRAVENOUS | Status: DC

## 2012-03-06 MED ORDER — HYDROMORPHONE HCL PF 1 MG/ML IJ SOLN
1.0000 mg | Freq: Once | INTRAMUSCULAR | Status: AC
  Administered 2012-03-06: 1 mg via INTRAVENOUS
  Filled 2012-03-06: qty 1

## 2012-03-06 MED ORDER — SODIUM CHLORIDE 0.9 % IV BOLUS (SEPSIS)
500.0000 mL | Freq: Once | INTRAVENOUS | Status: AC
  Administered 2012-03-06: 500 mL via INTRAVENOUS

## 2012-03-06 MED ORDER — POLYETHYLENE GLYCOL 3350 17 G PO PACK
17.0000 g | PACK | Freq: Every day | ORAL | Status: DC
  Filled 2012-03-06 (×2): qty 1

## 2012-03-06 NOTE — Progress Notes (Signed)
Agree with above.  Pt's Na up today.  Con't with PO.  If doing well this PM will d/c home. -D/c  abx

## 2012-03-06 NOTE — Progress Notes (Signed)
Patient ID: Ann [name], female   DOB: [birth year], 31 y.o.   MRN: [id number] 3 Days Post-Op  Subjective: Other than Headache patient states that she feels good. Has been tolerating diet well. Objective: Vital signs in last 24 hours: Temp:  [97.5 F (36.4 C)-99.2 F (37.3 C)] 98.2 F (36.8 C) (10/28 0435) Pulse Rate:  [48-89] 77  (10/28 0435) Resp:  [15-20] 18  (10/28 0435) BP: (79-132)/(16-98) 103/52 mmHg (10/28 0435) SpO2:  [95 %-100 %] 98 % (10/28 0435) Weight:  [257 lb 15 oz (117 kg)-266 lb 15.6 oz (121.1 kg)] 257 lb 15 oz (117 kg) (10/28 0300) Last BM Date: 03/05/12  Intake/Output from previous day: 10/27 0701 - 10/28 0700 In: 640 [I.V.:440; IV Piggyback:200] Out: 1 [Emesis/NG output:1] Intake/Output this shift:   General appearance: A/A/O Neuro: c/o headache Chest: CTA Cardiac: RRR No M/R/G Abdomen: soft, minimally tender, + BS clean dry intact, no drainage, no distention wound appears C/D/I VSS,afebrile;  Hyponatremia resolving (currently 132)  Lab Results:  No results found for this basename: WBC:2,HGB:2,HCT:2,PLT:2 in the last 72 hours BMET  [facility] 03/06/12 0537 03/05/12 2321  NA 132* 127*  K 3.8 4.1  CL 99 96  CO2 25 24  GLUCOSE 121* 123*  BUN 7 6  CREATININE 0.96 0.89  CALCIUM 9.2 9.4   PT/INR No results found for this basename: LABPROT:2,INR:2 in the last 72 hours ABG No results found for this basename: PHART:2,PCO2:2,PO2:2,HCO3:2 in the last 72 hours  Studies/Results: No results found.  Anti-infectives: Anti-infectives     Start     Dose/Rate Route Frequency Ordered Stop   03/01/12 0100   ciprofloxacin (CIPRO) IVPB 400 mg        400 mg 200 mL/hr over 60 Minutes Intravenous Every 12 hours 03/01/12 0030            Assessment/Plan:  Patient Active Problem List  Diagnosis  . Von Willebrand disease - hx PPH  . History of abnormal Pap smear - had colpo  . Infertility, female  . HSV-2 infection - history, no outbreaks  . Anemia  .  Asthma - uses inhaler PRN  . History of pyelonephritis - frequent history as a child  . Migraine  . History of depression  . History of smoking  . Morbid obesity  . Acute cholecystitis  . Hyponatremia  s/p Procedure(s) (LRB) with comments: ENDOSCOPIC RETROGRADE CHOLANGIOPANCREATOGRAPHY (ERCP) (N/A) - dl/hung Hyponatremia resolving: Will stop 3% NS infusion Tolerating diet Probable discharge to home self care later today (will recheck BMP this afternoon)      LOS: 6 days    Airon Sahni 03/06/2012

## 2012-03-06 NOTE — Progress Notes (Signed)
[facility] PROGRESS NOTE  Ann [name] ZOX:[id number] DOB: Nov 08, 1980 DOA: 02/29/2012 PCP: Pecola Lawless, MD  Assessment/Plan: Principal Problem:  *Acute cholecystitis Active Problems:  Von Willebrand disease - hx PPH  Hyponatremia    Hyponatremia in the setting of DDAVP use for Von Willebrand disease  3% hypertonic saline discontinued last night Patient currently asymptomatic Sodium is up to 132 Fluid restriction has been discontinued Repeat BMP this afternoon If stable. Patient may discharge home today    Code Status: full Family Communication: family updated about patient's clinical progress Disposition Plan:  As above    Brief narrative: Complaining of mild headache but no other symptoms  Consultants:  None  Procedures:  None  Antibiotics:  The surgery     Objective: Filed Vitals:   03/06/12 0042 03/06/12 0300 03/06/12 0435 03/06/12 0800  BP: 100/52  103/52 113/74  Pulse: 86  77 89  Temp: 97.5 F (36.4 C)  98.2 F (36.8 C)   TempSrc: Axillary  Oral   Resp: 16  18 18   Height:      Weight:  117 kg (257 lb 15 oz)    SpO2: 95%  98% 97%    Intake/Output Summary (Last 24 hours) at 03/06/12 0827 Last data filed at 03/06/12 0800  Gross per 24 hour  Intake    640 ml  Output      2 ml  Net    638 ml    Exam:  General appearance: A/A/O  Neuro: c/o headache  Chest: CTA  Cardiac: RRR No M/R/G  Abdomen: soft, minimally tender, + BS clean dry intact, no drainage, no distention wound appears C/D/I  VSS,afebrile; Hyponatremia resolving (currently 132     Data Reviewed: Basic Metabolic Panel:  Lab 03/06/12 8119 03/05/12 2321 03/05/12 2041 03/05/12 0800 03/05/12 0430  NA 132* 127* 126* 116* 115*  K 3.8 4.1 4.5 3.8 4.0  CL 99 96 94* 83* 82*  CO2 25 24 24 24 23   GLUCOSE 121* 123* 107* 103* 95  BUN 7 6 5* 5* 6  CREATININE 0.96 0.89 0.76 0.49* 0.57  CALCIUM 9.2 9.4 9.6 9.0 9.0  MG -- -- -- -- --  PHOS -- -- -- -- --    Liver  Function Tests:  Lab 03/04/12 0615 03/03/12 0555 03/02/12 0557 03/01/12 0650 02/29/12 1520  AST 171* 362* 57* 106* 242*  ALT 284* 328* 200* 278* 410*  ALKPHOS 181* 196* 140* 150* 181*  BILITOT 0.9 2.6* 0.6 1.0 2.9*  PROT 6.8 7.6 7.2 7.0 7.8  ALBUMIN 3.4* 3.9 3.6 3.5 4.0    Lab 03/03/12 0555 02/29/12 1520  LIPASE 10* 13  AMYLASE -- --   No results found for this basename: AMMONIA:5 in the last 168 hours  CBC:  Lab 03/03/12 0555 03/01/12 0650 02/29/12 1543 02/29/12 1520  WBC 11.6* 8.2 -- 8.4  NEUTROABS -- -- -- --  HGB 10.5* 10.7* 12.6 11.6*  HCT 32.8* 33.6* 37.0 35.4*  MCV 90.1 90.3 -- 88.9  PLT 332 326 -- 358    Cardiac Enzymes: No results found for this basename: CKTOTAL:5,CKMB:5,CKMBINDEX:5,TROPONINI:5 in the last 168 hours BNP (last 3 results)  Basename 02/25/12 2153  PROBNP 93.1     CBG: No results found for this basename: GLUCAP:5 in the last 168 hours  Recent Results (from the past 240 hour(s))  SURGICAL PCR SCREEN     Status: Normal   Collection Time   03/02/12  2:05 AM      Component Value Range Status  Comment   MRSA, PCR NEGATIVE  NEGATIVE Final    Staphylococcus aureus NEGATIVE  NEGATIVE Final      Studies: Dg Chest 2 View  02/25/2012  *RADIOLOGY REPORT*  Clinical Data: Chest pain.  CHEST - 2 VIEW  Comparison: April 19, 2007.  Findings: Cardiomediastinal silhouette appears normal.  No acute pulmonary disease is noted.  Bony thorax is intact.  IMPRESSION: No acute cardiopulmonary abnormality seen.   Original Report Authenticated By: Venita Sheffield., M.D.    Dg Cholangiogram Operative  03/02/2012  *RADIOLOGY REPORT*  Clinical Data:   Cholelithiasis  INTRAOPERATIVE CHOLANGIOGRAM  Technique:  Cholangiographic images from the C-arm fluoroscopic device were submitted for interpretation post-operatively.  Please see the procedural report for the amount of contrast and the fluoroscopy time utilized.  Comparison:  None  Findings: Persistent filling  defect in the distal common bile duct near the ampulla with a meniscus.  The proximal common duct is mildly distended. Intrahepatic biliary ducts are incompletely visualized, grossly unremarkable.  There is no evident passage of contrast material into the duodenum.  IMPRESSION 1.  Obstructing distal CBD stone.   Original Report Authenticated By: Osa Craver, M.D.    US Abdomen Complete  02/29/2012  *RADIOLOGY REPORT*  Clinical Data:  Abdominal pain.  Elevated liver enzymes.  Emesis.  COMPLETE ABDOMINAL ULTRASOUND  Comparison:  07/29/2007  Findings:  Gallbladder:  A 2.4 cm shadowing mobile gallstone is present the gallbladder.  No overt gallbladder wall thickening or pericholecystic fluid.  Sonographic Murphy's sign absent.  Common bile duct:  Measures 10 mm, considerably dilated.  No directly demonstrated choledocholithiasis.  Liver:  No focal lesion identified.  Within normal limits in parenchymal echogenicity.  IVC:  Appears normal.  Pancreas:  Pancreatic head and body appear normal.  Pancreatic tail obscured by bowel gas.  Spleen:  Measures 6.9 cm craniocaudad and appears normal.  Right Kidney:  Measures 11.5 cm in length and appears normal.  Left Kidney:  Measures 12.0 cm in length and appears normal.  Abdominal aorta:  No aneurysm identified.  IMPRESSION:  1.  Cholelithiasis. 2.  Dilated common bile duct at 10 mm diameter.  The CBD caliber appeared normal back on the CT scan of 07/29/2007.  We do not directly demonstrate choledocholithiasis, and no definite intrahepatic biliary dilatation is currently observed.   Original Report Authenticated By: Dellia Cloud, M.D.    Dg Ercp With Sphincterotomy  03/03/2012  *RADIOLOGY REPORT*  Clinical Data: The common bile duct than  ERCP with sphincterotomy.  Comparison:  Intraoperative cholangiogram 10/24 1013.  Technique:  Multiple spot images obtained with the fluoroscopic device and submitted for interpretation post-procedure.  ERCP was  performed by Dr. the.  Findings: Three spot fluoroscopic images are provided.  Initial image demonstrates endoscope within the duodenum.  The common bile duct is cannulated following sphincterotomy.  A  balloon sweep extracts a single stone.  No evidence of cystic duct leak.  IMPRESSION: ERCP with sphincterotomy and stone extraction.  These images were submitted for radiologic interpretation only. Please see the procedural report for the amount of contrast and the fluoroscopy time utilized.   Original Report Authenticated By: Genevive Bi, M.D.     Scheduled Meds:   . atropine  1 drop Right Eye Daily  . chlorhexidine  15 mL Mouth Rinse BID  . pantoprazole  40 mg Oral Daily  . polyethylene glycol  17 g Oral Daily  . prednisoLONE acetate  1 drop Right Eye QID  .  sodium chloride  10-40 mL Intracatheter Q12H  . DISCONTD: ciprofloxacin  400 mg Intravenous Q12H  . DISCONTD: pantoprazole (PROTONIX) IV  40 mg Intravenous QHS   Continuous Infusions:   . DISCONTD: sodium chloride    . DISCONTD: sodium chloride (hypertonic) 40 mL/hr at 03/06/12 0119    Principal Problem:  *Acute cholecystitis Active Problems:  Von Willebrand disease - hx PPH  Hyponatremia    Time spent: 40 minutes   [facility]  [facility] Pager [phone]. If 8PM-8AM, please contact night-coverage at [email or web address], password [facility] 03/06/2012, 8:27 AM  LOS: 6 days

## 2012-03-06 NOTE — Progress Notes (Signed)
Nurse contacted MD to inform of pt continued complaint of back of the head headaches since start of shift, nurse communicated interventions administered during shift.  MD instructed nurse to administer Toradol and continue to monitor.  MD ordered CT scan of head

## 2012-03-07 LAB — BASIC METABOLIC PANEL
BUN: 8 mg/dL (ref 6–23)
CO2: 26 mEq/L (ref 19–32)
Calcium: 9.4 mg/dL (ref 8.4–10.5)
Chloride: 102 mEq/L (ref 96–112)
Creatinine, Ser: 0.81 mg/dL (ref 0.50–1.10)
GFR calc Af Amer: >90 mL/min (ref >90)

## 2012-03-07 MED ORDER — OXYCODONE-ACETAMINOPHEN 5-325 MG PO TABS: 1.0000 | ORAL_TABLET | Freq: Four times a day (QID) | ORAL | Status: DC | PRN

## 2012-03-07 NOTE — Discharge Instructions (Signed)
[facility] ______[facility] °LAPAROSCOPIC SURGERY: POST OP INSTRUCTIONS °Always review your discharge instruction sheet given to you by the facility where your surgery was performed. °IF YOU HAVE DISABILITY OR FAMILY LEAVE FORMS, YOU MUST BRING THEM TO THE OFFICE FOR PROCESSING.   °DO NOT GIVE THEM TO YOUR DOCTOR. ° °1. A prescription for pain medication may be given to you upon discharge.  Take your pain medication as prescribed, if needed.  If narcotic pain medicine is not needed, then you may take acetaminophen (Tylenol) or ibuprofen (Advil) as needed. °2. Take your usually prescribed medications unless otherwise directed. °3. If you need a refill on your pain medication, please contact your pharmacy.  They will contact our office to request authorization. Prescriptions will not be filled after 5pm or on week-ends. °4. You should follow a light diet the first few days after arrival home, such as soup and crackers, etc.  Be sure to include lots of fluids daily. °5. Most patients will experience some swelling and bruising in the area of the incisions.  Ice packs will help.  Swelling and bruising can take several days to resolve.  °6. It is common to experience some constipation if taking pain medication after surgery.  Increasing fluid intake and taking a stool softener (such as Colace) will usually help or prevent this problem from occurring.  A mild laxative (Milk of Magnesia or Miralax) should be taken according to package instructions if there are no bowel movements after 48 hours. °7. Unless discharge instructions indicate otherwise, you may remove your bandages 24-48 hours after surgery, and you may shower at that time.  You may have steri-strips (small skin tapes) in place directly over the incision.  These strips should be left on the skin for 7-10 days.  If your surgeon used skin glue on the incision, you may shower in 24 hours.  The glue will flake off over the next 2-3 weeks.  Any sutures or  staples will be removed at the office during your follow-up visit. °8. ACTIVITIES:  You may resume regular (light) daily activities beginning the next day--such as daily self-care, walking, climbing stairs--gradually increasing activities as tolerated.  You may have sexual intercourse when it is comfortable.  Refrain from any heavy lifting or straining until approved by your doctor. °a. You may drive when you are no longer taking prescription pain medication, you can comfortably wear a seatbelt, and you can safely maneuver your car and apply brakes. °b. RETURN TO WORK:  __________________________________________________________ °9. You should see your doctor in the office for a follow-up appointment approximately 2-3 weeks after your surgery.  Make sure that you call for this appointment within a day or two after you arrive home to insure a convenient appointment time. °10. OTHER INSTRUCTIONS: __________________________________________________________________________________________________________________________ __________________________________________________________________________________________________________________________ °WHEN TO CALL YOUR DOCTOR: °1. Fever over 101.0 °2. Inability to urinate °3. Continued bleeding from incision. °4. Increased pain, redness, or drainage from the incision. °5. Increasing abdominal pain ° °The clinic staff is available to answer your questions during regular business hours.  Please don’t hesitate to call and ask to speak to one of the nurses for clinical concerns.  If you have a medical emergency, go to the nearest emergency room or call 911.  A surgeon from [facility] is always on call at the hospital. °[address], [address], [place], [place]  [address] ? [address] [address], [place], [place]   [address] °[phone] ? [phone] ? FAX [phone] °Web site:   [email or web address] °

## 2012-03-07 NOTE — Progress Notes (Signed)
Pt d/c home alert and oriented.  Vitals signs prior to d/c are documented in doc flowsheets.  There were no questions nor concerns prior to d/c.  Pt belongings have been returned to patient and mom is at bedside.

## 2012-03-07 NOTE — Discharge Summary (Signed)
  Physician Discharge Summary  Patient ID: Ann [name] MRN: [id number] DOB/AGE: Oct 03, 1980 31 y.o.  Admit date: 02/29/2012 Discharge date: 03/07/2012  Admitting Diagnosis: Early acute cholecystitis, possible choledocholitiasis Emesis   Discharge Diagnosis Early acute cholecystitis, possible choledocholitiasis Von Willebrand's Disease Hyponatremia    Consultants GI Internal medicine  Procedures Laparoscopic Cholecystectomy with [facility] ERCP  Hospital Course 31 yr old female who presented to [facility] with emesis and abdominal pain.  Workup showed early cholecystitis with possible choledocholithiasis.  Patient was admitted and underwent 1st procedure listed above.  Tolerated procedure well and was transferred to the floor.  GI was consulted due to CBD.  She underwent ERCP as listed above and tolerated this well.  She did develop hyponatremia and IM was consulted.  This was corrected slowly.  She remained asymptomatic from this.  OVer the next several days her diet was advanced as tolerated.  On POD#5, the patient was voiding well, tolerating diet, ambulating well, pain well controlled, vital signs stable, incisions c/d/i and felt stable for discharge home.  Patient will follow up in our office in 10-14 days and knows to call with questions or concerns.    Medication List     As of 03/07/2012  7:43 AM    STOP taking these medications         HYDROcodone-acetaminophen 5-500 MG per tablet   Commonly known as: VICODIN      ibuprofen 200 MG tablet   Commonly known as: ADVIL,MOTRIN      TAKE these medications         atropine 1 % ophthalmic solution   Place 1 drop into the right eye daily. Floaters in vision and blurred vision      ondansetron 4 MG tablet   Commonly known as: ZOFRAN   Take 1 tablet (4 mg total) by mouth every 6 (six) hours.      oxyCODONE-acetaminophen 5-325 MG per tablet   Commonly known as: PERCOCET/ROXICET   Take 1-2 tablets by mouth every 6 (six) hours as  needed.      prednisoLONE acetate 1 % ophthalmic suspension   Commonly known as: PRED FORTE   Place 1 drop into the right eye 4 (four) times daily. Floater and blurred vision in right eye      STIMATE 1.5 MG/ML Soln   Generic drug: desmopressin   Place 1 spray into the nose as needed. Before minor surgery             Follow-up Information    Follow up with Currie Paris, MD.   Contact information:   [address] [place] [address] [phone]          Signed: Denny Levy [facility] [phone]  03/07/2012, 7:43 AM

## 2012-03-10 ENCOUNTER — Other Ambulatory Visit: Payer: Self-pay | Admitting: Rheumatology

## 2012-03-10 ENCOUNTER — Ambulatory Visit
Admission: RE | Admit: 2012-03-10 | Discharge: 2012-03-10 | Disposition: A | Discharge status: Home / Self Care | Payer: 59 | Source: Ambulatory Visit | Attending: Rheumatology | Admitting: Rheumatology

## 2012-03-10 ENCOUNTER — Telehealth (INDEPENDENT_AMBULATORY_CARE_PROVIDER_SITE_OTHER): Payer: Self-pay | Admitting: General Surgery

## 2012-03-10 DIAGNOSIS — D869 Sarcoidosis, unspecified: Secondary | ICD-10-CM

## 2012-03-10 NOTE — Telephone Encounter (Signed)
Message copied by Liliana Cline on Fri Mar 10, 2012  9:08 AM ------      Message from: Cathi Roan      Created: Fri Mar 10, 2012  9:02 AM      Regarding: Work Note      Contact: [phone]       Patient here for [facility] needs when she is supposed to return to work Still hurting needs to know when she should return to work. Please CC Emmie Niemann so she can let Loletta Parish know when her return to work date is.

## 2012-03-10 NOTE — Telephone Encounter (Signed)
Note written for return to work two weeks from surgery 03/16/2012. Taken to [place] to contact patient.

## 2012-03-24 ENCOUNTER — Encounter (INDEPENDENT_AMBULATORY_CARE_PROVIDER_SITE_OTHER): Payer: 59 | Admitting: Surgery

## 2012-04-27 ENCOUNTER — Encounter (HOSPITAL_COMMUNITY): Payer: Self-pay

## 2012-04-27 ENCOUNTER — Encounter (HOSPITAL_COMMUNITY)
Admission: RE | Admit: 2012-04-27 | Discharge: 2012-04-27 | Disposition: A | Discharge status: Home / Self Care | Payer: 59 | Source: Ambulatory Visit | Attending: Obstetrics and Gynecology | Admitting: Obstetrics and Gynecology

## 2012-04-27 LAB — CBC
HCT: 36.9 % (ref 36.0–46.0)
MCHC: 32 g/dL (ref 30.0–36.0)
MCV: 91.1 fL (ref 78.0–100.0)
Platelets: 353 10*3/uL (ref 150–400)
RDW: 14.5 % (ref 11.5–15.5)

## 2012-04-27 NOTE — Patient Instructions (Addendum)
   Your procedure is scheduled [id number] December 20th  Enter through the Main Entrance of [facility] at:6am Pick up the phone at the desk and dial [phone] and inform us of your arrival.  Please call this number if you have any problems the morning of surgery: [phone]  Remember: Do not eat or drink anything after midnight tonight   Do not wear jewelry, make-up, or FINGER nail polish No metal in your hair or on your body. Do not wear lotions, powders, perfumes. You may wear deodorant.  Please use your CHG wash as directed prior to surgery.  Do not shave anywhere for at least 12 hours prior to first CHG shower.  Do not bring valuables to the hospital.    Patients discharged on the day of surgery will not be allowed to drive home.

## 2012-04-28 ENCOUNTER — Ambulatory Visit (HOSPITAL_COMMUNITY)
Admission: RE | Admit: 2012-04-28 | Discharge: 2012-04-28 | Disposition: A | Discharge status: Home / Self Care | Payer: 59 | Source: Ambulatory Visit | Attending: Obstetrics and Gynecology | Admitting: Obstetrics and Gynecology

## 2012-04-28 ENCOUNTER — Ambulatory Visit (HOSPITAL_COMMUNITY): Payer: 59

## 2012-04-28 ENCOUNTER — Encounter (INDEPENDENT_AMBULATORY_CARE_PROVIDER_SITE_OTHER): Payer: Self-pay | Admitting: Surgery

## 2012-04-28 ENCOUNTER — Encounter (HOSPITAL_COMMUNITY): Payer: Self-pay

## 2012-04-28 ENCOUNTER — Encounter (HOSPITAL_COMMUNITY)
Admission: RE | Disposition: A | Discharge status: Home / Self Care | Payer: Self-pay | Source: Ambulatory Visit | Attending: Obstetrics and Gynecology

## 2012-04-28 ENCOUNTER — Encounter (HOSPITAL_COMMUNITY): Payer: Self-pay | Admitting: *Deleted

## 2012-04-28 DIAGNOSIS — Z8659 Personal history of other mental and behavioral disorders: Secondary | ICD-10-CM

## 2012-04-28 DIAGNOSIS — D6800 Von Willebrand disease, unspecified: Secondary | ICD-10-CM | POA: Insufficient documentation

## 2012-04-28 DIAGNOSIS — E871 Hypo-osmolality and hyponatremia: Secondary | ICD-10-CM

## 2012-04-28 DIAGNOSIS — Z01812 Encounter for preprocedural laboratory examination: Secondary | ICD-10-CM | POA: Insufficient documentation

## 2012-04-28 DIAGNOSIS — Z87891 Personal history of nicotine dependence: Secondary | ICD-10-CM

## 2012-04-28 DIAGNOSIS — Z01818 Encounter for other preprocedural examination: Secondary | ICD-10-CM | POA: Insufficient documentation

## 2012-04-28 DIAGNOSIS — K810 Acute cholecystitis: Secondary | ICD-10-CM

## 2012-04-28 DIAGNOSIS — N920 Excessive and frequent menstruation with regular cycle: Secondary | ICD-10-CM | POA: Insufficient documentation

## 2012-04-28 DIAGNOSIS — N979 Female infertility, unspecified: Secondary | ICD-10-CM

## 2012-04-28 DIAGNOSIS — Z87898 Personal history of other specified conditions: Secondary | ICD-10-CM

## 2012-04-28 DIAGNOSIS — D649 Anemia, unspecified: Secondary | ICD-10-CM

## 2012-04-28 DIAGNOSIS — Z87448 Personal history of other diseases of urinary system: Secondary | ICD-10-CM

## 2012-04-28 DIAGNOSIS — B009 Herpesviral infection, unspecified: Secondary | ICD-10-CM

## 2012-04-28 HISTORY — PX: DILITATION & CURRETTAGE/HYSTROSCOPY WITH THERMACHOICE ABLATION: SHX5569

## 2012-04-28 SURGERY — DILATATION & CURETTAGE/HYSTEROSCOPY WITH THERMACHOICE ABLATION
Anesthesia: General | Site: Uterus | Wound class: Clean Contaminated

## 2012-04-28 MED ORDER — ONDANSETRON HCL 4 MG/2ML IJ SOLN
INTRAMUSCULAR | Status: DC | PRN
  Administered 2012-04-28: 4 mg via INTRAVENOUS

## 2012-04-28 MED ORDER — LACTATED RINGERS IV SOLN
INTRAVENOUS | Status: DC
  Administered 2012-04-28 (×2): via INTRAVENOUS

## 2012-04-28 MED ORDER — HYDROMORPHONE HCL 2 MG PO TABS
ORAL_TABLET | ORAL | Status: AC
  Filled 2012-04-28: qty 1

## 2012-04-28 MED ORDER — CEFAZOLIN SODIUM-DEXTROSE 2-3 GM-% IV SOLR
2.0000 g | INTRAVENOUS | Status: AC
  Administered 2012-04-28: 2 g via INTRAVENOUS

## 2012-04-28 MED ORDER — PROPOFOL 10 MG/ML IV EMUL
INTRAVENOUS | Status: DC | PRN
  Administered 2012-04-28: 250 mg via INTRAVENOUS

## 2012-04-28 MED ORDER — HYDROMORPHONE HCL PF 1 MG/ML IJ SOLN
INTRAMUSCULAR | Status: DC | PRN
  Administered 2012-04-28: 1 mg via INTRAVENOUS

## 2012-04-28 MED ORDER — FENTANYL CITRATE 0.05 MG/ML IJ SOLN
INTRAMUSCULAR | Status: AC
  Filled 2012-04-28: qty 5

## 2012-04-28 MED ORDER — IBUPROFEN 800 MG PO TABS
800.0000 mg | ORAL_TABLET | Freq: Once | ORAL | Status: AC
  Administered 2012-04-28: 800 mg via ORAL

## 2012-04-28 MED ORDER — HYDROMORPHONE HCL PF 1 MG/ML IJ SOLN
INTRAMUSCULAR | Status: AC
  Filled 2012-04-28: qty 1

## 2012-04-28 MED ORDER — FENTANYL CITRATE 0.05 MG/ML IJ SOLN
INTRAMUSCULAR | Status: AC
  Administered 2012-04-28: 50 ug via INTRAVENOUS
  Filled 2012-04-28: qty 2

## 2012-04-28 MED ORDER — DEXTROSE 5 % IV SOLN
INTRAVENOUS | Status: DC | PRN
  Administered 2012-04-28: 1

## 2012-04-28 MED ORDER — MIDAZOLAM HCL 2 MG/2ML IJ SOLN
INTRAMUSCULAR | Status: AC
  Filled 2012-04-28: qty 2

## 2012-04-28 MED ORDER — FENTANYL CITRATE 0.05 MG/ML IJ SOLN
INTRAMUSCULAR | Status: DC | PRN
  Administered 2012-04-28: 50 ug via INTRAVENOUS
  Administered 2012-04-28: 100 ug via INTRAVENOUS

## 2012-04-28 MED ORDER — DESMOPRESSIN ACE RHINAL TUBE 0.01 % NA SOLN: 10.0000 ug | Freq: Every day | NASAL | Status: DC

## 2012-04-28 MED ORDER — HYDROMORPHONE HCL 2 MG PO TABS: 2.0000 mg | ORAL_TABLET | ORAL | Status: DC | PRN

## 2012-04-28 MED ORDER — CEFAZOLIN SODIUM-DEXTROSE 2-3 GM-% IV SOLR
INTRAVENOUS | Status: AC
  Filled 2012-04-28: qty 50

## 2012-04-28 MED ORDER — LIDOCAINE HCL (CARDIAC) 20 MG/ML IV SOLN
INTRAVENOUS | Status: DC | PRN
  Administered 2012-04-28: 100 mg via INTRAVENOUS

## 2012-04-28 MED ORDER — FENTANYL CITRATE 0.05 MG/ML IJ SOLN
25.0000 ug | INTRAMUSCULAR | Status: DC | PRN
  Administered 2012-04-28 (×4): 50 ug via INTRAVENOUS

## 2012-04-28 MED ORDER — IBUPROFEN 800 MG PO TABS
ORAL_TABLET | ORAL | Status: AC
  Administered 2012-04-28: 800 mg via ORAL
  Filled 2012-04-28: qty 1

## 2012-04-28 MED ORDER — ONDANSETRON HCL 4 MG/2ML IJ SOLN
INTRAMUSCULAR | Status: AC
  Filled 2012-04-28: qty 2

## 2012-04-28 MED ORDER — HYDROMORPHONE HCL 2 MG PO TABS
2.0000 mg | ORAL_TABLET | Freq: Once | ORAL | Status: AC
  Administered 2012-04-28: 2 mg via ORAL

## 2012-04-28 MED ORDER — LIDOCAINE HCL 1 % IJ SOLN
INTRAMUSCULAR | Status: DC | PRN
  Administered 2012-04-28: 10 mL

## 2012-04-28 MED ORDER — DESMOPRESSIN ACETATE 4 MCG/ML IJ SOLN
0.3000 ug/kg | Freq: Once | INTRAMUSCULAR | Status: DC
  Filled 2012-04-28: qty 8.3

## 2012-04-28 SURGICAL SUPPLY — 12 items
CANISTER SUCTION 2500CC (MISCELLANEOUS) ×2 IMPLANT
CATH ROBINSON RED A/P 16FR (CATHETERS) ×2 IMPLANT
CATH THERMACHOICE III (CATHETERS) ×3 IMPLANT
CLOTH BEACON ORANGE TIMEOUT ST (SAFETY) ×2 IMPLANT
CONTAINER PREFILL 10% NBF 60ML (FORM) ×3 IMPLANT
DRESSING TELFA 8X3 (GAUZE/BANDAGES/DRESSINGS) ×2 IMPLANT
GLOVE BIO SURGEON STRL SZ 6.5 (GLOVE) ×4 IMPLANT
GOWN STRL REIN XL XLG (GOWN DISPOSABLE) ×4 IMPLANT
PACK HYSTEROSCOPY LF (CUSTOM PROCEDURE TRAY) ×2 IMPLANT
PAD OB MATERNITY 4.3X12.25 (PERSONAL CARE ITEMS) ×2 IMPLANT
TOWEL OR 17X24 6PK STRL BLUE (TOWEL DISPOSABLE) ×4 IMPLANT
WATER STERILE IRR 1000ML POUR (IV SOLUTION) ×2 IMPLANT

## 2012-04-28 NOTE — Addendum Note (Signed)
Addendum  created 04/28/12 0941 by Elbert Ewings, CRNA   Modules edited:Charges VN

## 2012-04-28 NOTE — Anesthesia Postprocedure Evaluation (Signed)
Anesthesia Post Note  Patient: Ann [name]  Procedure(s) Performed: Procedure(s) (LRB): DILATATION & CURETTAGE/HYSTEROSCOPY WITH THERMACHOICE ABLATION (N/A)  Anesthesia type: General  Patient location: PACU  Post pain: Pain level controlled  Post assessment: Post-op Vital signs reviewed  Last Vitals:  Filed Vitals:   04/28/12 0815  BP: 136/85  Pulse: 83  Temp: 37.1 C  Resp: 16    Post vital signs: Reviewed  Level of consciousness: sedated  Complications: No apparent anesthesia complicationsfj

## 2012-04-28 NOTE — Brief Op Note (Signed)
04/28/2012  7:57 AM  PATIENT:  Ann [name]  31 y.o. female  PRE-OPERATIVE DIAGNOSIS:  Menorrhagia, Von Willebrands  POST-OPERATIVE DIAGNOSIS:  Menorrhagia, Von Willebrands  PROCEDURE:  Procedure(s) (LRB) with comments: DILATATION & CURETTAGE/HYSTEROSCOPY WITH THERMACHOICE ABLATION (N/A)  SURGEON:  Surgeon(s) and Role:    * Jeani Hawking, MD - Primary  PHYSICIAN ASSISTANT:   ASSISTANTS: none   ANESTHESIA:   IV sedation and paracervical block  EBL:  Total I/O In: 1300 [I.V.:1300] Out: 50 [Urine:25; Blood:25]  BLOOD ADMINISTERED:none  DRAINS: none   LOCAL MEDICATIONS USED:  XYLOCAINE   SPECIMEN:  Source of Specimen:  uterine curettings  DISPOSITION OF SPECIMEN:  PATHOLOGY  COUNTS:  YES  TOURNIQUET:  * No tourniquets in log *  DICTATION: .Other Dictation: Dictation Number [id number]  PLAN OF CARE: Discharge to home after PACU  PATIENT DISPOSITION:  PACU - hemodynamically stable.   Delay start of Pharmacological VTE agent (>24hrs) due to surgical blood loss or risk of bleeding: not applicable

## 2012-04-28 NOTE — Anesthesia Preprocedure Evaluation (Signed)
Anesthesia Evaluation  Patient identified by MRN, date of birth, ID band Patient awake    Reviewed: Allergy & Precautions, H&P , Patient's Chart, lab work & pertinent test results, reviewed documented beta blocker date and time   Airway Mallampati: II TM Distance: >3 FB Neck ROM: full    Dental No notable dental hx.    Pulmonary  breath sounds clear to auscultation  Pulmonary exam normal       Cardiovascular Rhythm:regular Rate:Normal     Neuro/Psych    GI/Hepatic   Endo/Other  Morbid obesity  Renal/GU      Musculoskeletal   Abdominal   Peds  Hematology  (+) Blood dyscrasia, , Von Willabrand Dz... Getting DDAVP now   Anesthesia Other Findings   Reproductive/Obstetrics                           Anesthesia Physical Anesthesia Plan  ASA: III  Anesthesia Plan: General   Post-op Pain Management:    Induction: Intravenous  Airway Management Planned: LMA  Additional Equipment:   Intra-op Plan:   Post-operative Plan:   Informed Consent: I have reviewed the patients History and Physical, chart, labs and discussed the procedure including the risks, benefits and alternatives for the proposed anesthesia with the patient or authorized representative who has indicated his/her understanding and acceptance.   Dental Advisory Given  Plan Discussed with: CRNA and Surgeon  Anesthesia Plan Comments: (  Discussed  general anesthesia, including possible nausea, instrumentation of airway, sore throat,pulmonary aspiration, etc. I asked if the were any outstanding questions, or  concerns before we proceeded. )        Anesthesia Quick Evaluation

## 2012-04-28 NOTE — Transfer of Care (Signed)
Immediate Anesthesia Transfer of Care Note  Patient: Ann [name]  Procedure(s) Performed: Procedure(s) (LRB) with comments: DILATATION & CURETTAGE/HYSTEROSCOPY WITH THERMACHOICE ABLATION (N/A)  Patient Location: PACU  Anesthesia Type:General  Level of Consciousness: awake, alert  and oriented  Airway & Oxygen Therapy: Patient Spontanous Breathing and Patient connected to nasal cannula oxygen  Post-op Assessment: Report given to PACU RN and Post -op Vital signs reviewed and stable  Post vital signs: stable  Complications: No apparent anesthesia complications

## 2012-04-28 NOTE — Addendum Note (Signed)
Addendum  created 04/28/12 0941 by Sulaiman Imbert S Levina Boyack, CRNA   Modules edited:Charges VN    

## 2012-04-28 NOTE — Op Note (Signed)
Ann [name], Ann [name]               ACCOUNT NO.:  [id number]  MEDICAL RECORD NO.:  [id number]  LOCATION:  [facility]                          FACILITY:  [facility]  PHYSICIAN:  Zetta Stoneman L. Sears Oran, M.D.DATE OF BIRTH:  May 21, 1980  DATE OF PROCEDURE:  04/28/2012 DATE OF DISCHARGE:                              OPERATIVE REPORT   PREOPERATIVE DIAGNOSIS:  Menorrhagia and von Willebrand's.  POSTOPERATIVE DIAGNOSIS:  Menorrhagia and von Willebrand's.  PROCEDURE:  D and C, hysteroscopy, ThermaChoice endometrial ablation.  SURGEON:  Greg Cratty L. Kardell Virgil, MD  ANESTHESIA:  LMA with paracervical.  EBL:  Minimal.  COMPLICATIONS:  None.  PROCEDURE:  The patient had been taken to the operating room after she received her appropriate dosage of DDAVP according to the hematologist protocol.  She was then prepped and draped in usual sterile fashion after anesthesia was administered.  Paracervical block was performed in standard fashion.  The cervix was gently dilated using Pratt dilators. The diagnostic hysteroscope was inserted into the uterine cavity with excellent visualization.  No intracavitary lesion is noted.  The hysteroscope was removed and a curette was inserted and a sharp curette was used to thoroughly curette all tissue.  This was sent to Pathology. ThermaChoice balloon was inserted and a ThermaChoice endometrial ablation was performed according to the [employer] specifications. The intact balloon was removed after the 8 minutes cycle.  The patient tolerated the procedure well.  There was no excessive bleeding noted at the end of the procedure.  The patient was instructed to take her Stimate tomorrow if she has any heavy bleeding.  She will follow up with me in 2 weeks.     Najai Waszak L. Vincente Poli, M.D.     Florestine Avers  D:  04/28/2012  T:  04/28/2012  Job:  [id number]

## 2012-04-28 NOTE — H&P (Signed)
30 year old female with history of C Section and BTL presents for Thermachoice. She has severe menorrhagia. Sonohysterogram normal  History orf Von Tonita Cong - sees Dr. Cyndie Chime. Received recommended pre op dose of DDAVP.  Recently had Lap Chole - complicated by 5 day hospital stay for hyponatremia due to too much DDAVP.  Meds Ibuprofen  ALLERGIC TO Darvocet Codeine and Sulfa  Afebrile VSS General alert and oriented Lung CTAB Car RRR Abdomen is soft and non tender Pelvic is WNL  IMPRESSION: Menorrhagia Von Willebrand's  PLAN: D and C  Hysteroscopy Thermachoice Stimate post op day 1 Consent is signed

## 2012-05-01 ENCOUNTER — Encounter (HOSPITAL_COMMUNITY): Payer: Self-pay | Admitting: Obstetrics and Gynecology

## 2012-09-11 DIAGNOSIS — H3020 Posterior cyclitis, unspecified eye: Secondary | ICD-10-CM | POA: Insufficient documentation

## 2012-09-11 DIAGNOSIS — H35719 Central serous chorioretinopathy, unspecified eye: Secondary | ICD-10-CM | POA: Insufficient documentation

## 2012-10-10 DIAGNOSIS — H35051 Retinal neovascularization, unspecified, right eye: Secondary | ICD-10-CM | POA: Insufficient documentation

## 2012-11-09 ENCOUNTER — Other Ambulatory Visit: Payer: Self-pay | Admitting: Obstetrics and Gynecology

## 2013-09-24 ENCOUNTER — Encounter (HOSPITAL_COMMUNITY): Payer: Self-pay | Admitting: Emergency Medicine

## 2013-09-24 ENCOUNTER — Emergency Department (HOSPITAL_COMMUNITY)
Admission: EM | Admit: 2013-09-24 | Discharge: 2013-09-24 | Disposition: A | Discharge status: Home / Self Care | Payer: 59 | Attending: Emergency Medicine | Admitting: Emergency Medicine

## 2013-09-24 DIAGNOSIS — Z87891 Personal history of nicotine dependence: Secondary | ICD-10-CM | POA: Insufficient documentation

## 2013-09-24 DIAGNOSIS — R059 Cough, unspecified: Secondary | ICD-10-CM | POA: Insufficient documentation

## 2013-09-24 DIAGNOSIS — Z8619 Personal history of other infectious and parasitic diseases: Secondary | ICD-10-CM | POA: Insufficient documentation

## 2013-09-24 DIAGNOSIS — Z8679 Personal history of other diseases of the circulatory system: Secondary | ICD-10-CM | POA: Insufficient documentation

## 2013-09-24 DIAGNOSIS — R05 Cough: Secondary | ICD-10-CM | POA: Insufficient documentation

## 2013-09-24 DIAGNOSIS — Z79899 Other long term (current) drug therapy: Secondary | ICD-10-CM | POA: Insufficient documentation

## 2013-09-24 DIAGNOSIS — Z8659 Personal history of other mental and behavioral disorders: Secondary | ICD-10-CM | POA: Insufficient documentation

## 2013-09-24 DIAGNOSIS — M5432 Sciatica, left side: Secondary | ICD-10-CM

## 2013-09-24 DIAGNOSIS — IMO0002 Reserved for concepts with insufficient information to code with codable children: Secondary | ICD-10-CM | POA: Insufficient documentation

## 2013-09-24 DIAGNOSIS — Z862 Personal history of diseases of the blood and blood-forming organs and certain disorders involving the immune mechanism: Secondary | ICD-10-CM | POA: Insufficient documentation

## 2013-09-24 DIAGNOSIS — Z8669 Personal history of other diseases of the nervous system and sense organs: Secondary | ICD-10-CM | POA: Insufficient documentation

## 2013-09-24 DIAGNOSIS — Z87448 Personal history of other diseases of urinary system: Secondary | ICD-10-CM | POA: Insufficient documentation

## 2013-09-24 DIAGNOSIS — M5430 Sciatica, unspecified side: Secondary | ICD-10-CM | POA: Insufficient documentation

## 2013-09-24 DIAGNOSIS — Z9889 Other specified postprocedural states: Secondary | ICD-10-CM | POA: Insufficient documentation

## 2013-09-24 MED ORDER — PREDNISONE (PAK) 10 MG PO TABS: ORAL_TABLET | Freq: Every day | ORAL | Status: DC

## 2013-09-24 MED ORDER — TRAMADOL HCL 50 MG PO TABS: 50.0000 mg | ORAL_TABLET | Freq: Four times a day (QID) | ORAL | Status: DC | PRN

## 2013-09-24 MED ORDER — CYCLOBENZAPRINE HCL 10 MG PO TABS: 10.0000 mg | ORAL_TABLET | Freq: Two times a day (BID) | ORAL | Status: DC | PRN

## 2013-09-24 NOTE — ED Notes (Signed)
Pt has had back sx x 3 with Dr. Jeral Fruit and fusion by Dr. Jordan Likes in 2010. Started 2 months ago with left hip and leg pain. Reports "pins and needles" down left leg. Called Dr. Lindalou Hose office. They said they could not find her chart and could not see her until they did. Told pt to come to ED.

## 2013-09-24 NOTE — ED Notes (Signed)
Pt reports left leg pain for 2 months; reports back surgeries last in 2010. No injury to leg, can bear wt. Tried to see neurosurgeon, but was told they lost her records. Pt is a x 4.

## 2013-09-24 NOTE — ED Notes (Signed)
Pt requesting pain meds. FNP informed.

## 2013-09-24 NOTE — ED Provider Notes (Signed)
CSN: [id number]     Arrival date & time 09/24/13  1121 History  This chart was scribed for non-physician practitioner, Kerrie Buffalo, FNP working with Geoffery Lyons, MD by Greggory Stallion, ED scribe. This patient was seen in room TR07C/TR07C and the patient's care was started at 11:50 AM.   Chief Complaint  Patient presents with  . Leg Pain   The history is provided by the patient. No language interpreter was used.   HPI Comments: Ann [name] is a 34 y.o. female who presents to the Emergency Department complaining of gradual onset lower back pain that radiates into her left leg that started 2 weeks ago. States she has had 4 back surgeries. Pt has tried to see a neurosurgeon but states she was told they can not see her until they find her records. She has taken ibuprofen with no relief. States she has also had a productive cough. Denies fever, chills, bowel or bladder incontinence.   Past Medical History  Diagnosis Date  . Trichomonas   . Von Willebrand disease   . HSV-2 infection     NO RECENT OUTBREAKS  . Migraine   . Abnormal Pap smear 2010    colpo  . H/O varicella   . H/O cystitis   . H/O pyelonephritis     frequently during childhood  . Depression   . Anemia   . Blood dyscrasia     last seen when diagnosed with Von Willebrands by Dr Cyndie Chime 37yrs ago  . Flashers or floaters, right eye current 04/2012    being worked up for sarcoidosis of right eye   Past Surgical History  Procedure Laterality Date  . Back surgery      4 back surgeries  . Laparoscopy  2011  . Wisdom tooth extraction  2001  . Dilation and curettage of uterus    . Cesarean section  2004  2013    2 layer closure  . Multiple tooth extractions    . Ercp  03/03/2012    Procedure: ENDOSCOPIC RETROGRADE CHOLANGIOPANCREATOGRAPHY (ERCP);  Surgeon: Theda Belfast, MD;  Location: [facility] ENDOSCOPY;  Service: Endoscopy;  Laterality: N/A;  dl/hung  . Cholecystectomy  03/02/2012    Procedure: LAPAROSCOPIC  CHOLECYSTECTOMY WITH INTRAOPERATIVE CHOLANGIOGRAM;  Surgeon: Currie Paris, MD;  Location: [facility] OR;  Service: General;  Laterality: N/A;  . Dilitation & currettage/hystroscopy with thermachoice ablation  04/28/2012    Procedure: DILATATION & CURETTAGE/HYSTEROSCOPY WITH THERMACHOICE ABLATION;  Surgeon: Jeani Hawking, MD;  Location: [facility]S;  Service: Gynecology;  Laterality: N/A;   Family History  Problem Relation Age of Onset  . Stroke Mother   . Arthritis Mother   . Depression Mother   . COPD Father   . Kidney disease Paternal Uncle   . Stroke Maternal Grandfather   . Heart disease Paternal Grandfather    History  Substance Use Topics  . Smoking status: Former Smoker    Quit date: 09/29/2000  . Smokeless tobacco: Never Used  . Alcohol Use: Yes     Comment: socially   OB History   Grav Para Term Preterm Abortions TAB SAB Ect Mult Living   3 2 2  1 1    2      Review of Systems  Constitutional: Negative for fever and chills.  Respiratory: Positive for cough.   Genitourinary:       Negative for bowel or bladder incontinence.  Musculoskeletal: Positive for back pain and myalgias.  All other systems reviewed and are  negative.  Allergies  Darvocet; Codeine; and Sulfa antibiotics  Home Medications   Prior to Admission medications   Medication Sig Start Date End Date Taking? Authorizing Provider  atropine 1 % ophthalmic solution Place 1 drop into the right eye daily. Floaters in vision and blurred vision    Historical Provider, MD  desmopressin (DDAVP) 0.01 % nasal solution Place 1 spray (10 mcg total) into the nose daily. 04/28/12   Jeani Hawking, MD  HYDROmorphone (DILAUDID) 2 MG tablet Take 1 tablet (2 mg total) by mouth every 4 (four) hours as needed for pain. 04/28/12   Jeani Hawking, MD  ibuprofen (ADVIL,MOTRIN) 200 MG tablet Take 800 mg by mouth every 8 (eight) hours as needed. For  Migraines    Historical Provider, MD  phentermine 37.5 MG capsule Take 37.5  mg by mouth every morning.    Historical Provider, MD  prednisoLONE acetate (PRED FORTE) 1 % ophthalmic suspension Place 1 drop into the right eye 4 (four) times daily. Floater and blurred vision in right eye    Historical Provider, MD   BP 154/104  Pulse 89  Temp(Src) 99.3 F (37.4 C) (Oral)  Resp 18  SpO2 96%  LMP 08/25/2013  Physical Exam  Nursing note and vitals reviewed. Constitutional: She is oriented to person, place, and time. She appears well-developed and well-nourished. No distress.  HENT:  Head: Normocephalic and atraumatic.  Eyes: EOM are normal.  Neck: Neck supple. No tracheal deviation present.  Cardiovascular: Normal rate, regular rhythm and normal heart sounds.   Pulses:      Radial pulses are 2+ on the right side, and 2+ on the left side.  Pulses are strong bilaterally.   Pulmonary/Chest: Effort normal and breath sounds normal. No respiratory distress. She has no wheezes. She has no rales.  Abdominal: There is no CVA tenderness.  Musculoskeletal: Normal range of motion.  No pain over spinal column. Pain over left sciatic nerve that radiates down left leg.   Neurological: She is alert and oriented to person, place, and time.  Reflex Scores:      Bicep reflexes are 2+ on the right side and 2+ on the left side.      Patellar reflexes are 2+ on the right side and 2+ on the left side.      Achilles reflexes are 2+ on the right side and 2+ on the left side. Circulation is adequate. Good grip strength in bilateral upper extremities.   Skin: Skin is warm and dry.  Psychiatric: She has a normal mood and affect. Her behavior is normal.    ED Course  Procedures (including critical care time)  DIAGNOSTIC STUDIES: Oxygen Saturation is 96% on RA, normal by my interpretation.    COORDINATION OF CARE: 11:55 AM-Discussed treatment plan which includes a muscle relaxer, narcotic pain medication and ibuprofen with pt at bedside and pt agreed to plan.    MDM  33 y.o.  female with acute on chronic low back pain. Contacted her Neurosurgeon but they could not locate her most recent surgery record, only the one in 2010. Told her would call her back. Patient stable for discharge without neuro deficits. Will treat for pain and inflammation. She does have records from her past 2 surgeries in our system. Patient will take medical records to the office to help with getting a follow up appointment.  Discussed with the patient and all questioned fully answered. She will return if any problems arise.    Medication List  TAKE these medications       cyclobenzaprine 10 MG tablet  Commonly known as:  FLEXERIL  Take 1 tablet (10 mg total) by mouth 2 (two) times daily as needed for muscle spasms.     predniSONE 10 MG tablet  Commonly known as:  STERAPRED UNI-PAK  Take by mouth daily. Take 6 tablets PO today then 5, 4, 3, 2, 1     traMADol 50 MG tablet  Commonly known as:  ULTRAM  Take 1 tablet (50 mg total) by mouth every 6 (six) hours as needed.      ASK your doctor about these medications       ibuprofen 200 MG tablet  Commonly known as:  ADVIL,MOTRIN  Take 800 mg by mouth 2 (two) times daily as needed (for leg pain). For  Migraines     MUCINEX D PO  Take by mouth.         I personally performed the services described in this documentation, which was scribed in my presence. The recorded information has been reviewed and is accurate.  [facility] Orlene OchM Liani Caris, [place]NP 09/25/13 [phone]

## 2013-09-26 NOTE — ED Provider Notes (Signed)
Medical screening examination/treatment/procedure(s) were performed by non-physician practitioner and as supervising physician I was immediately available for consultation/collaboration.     Geoffery Lyons, MD 09/26/13 [phone]

## 2013-10-02 ENCOUNTER — Other Ambulatory Visit: Payer: Self-pay | Admitting: Neurosurgery

## 2013-10-02 DIAGNOSIS — M5416 Radiculopathy, lumbar region: Secondary | ICD-10-CM

## 2013-10-10 ENCOUNTER — Ambulatory Visit
Admission: RE | Admit: 2013-10-10 | Discharge: 2013-10-10 | Disposition: A | Discharge status: Home / Self Care | Payer: 59 | Source: Ambulatory Visit | Attending: Neurosurgery | Admitting: Neurosurgery

## 2013-10-10 DIAGNOSIS — M5416 Radiculopathy, lumbar region: Secondary | ICD-10-CM

## 2013-10-10 MED ORDER — GADOBENATE DIMEGLUMINE 529 MG/ML IV SOLN
20.0000 mL | Freq: Once | INTRAVENOUS | Status: AC | PRN
  Administered 2013-10-10: 20 mL via INTRAVENOUS

## 2014-02-22 ENCOUNTER — Encounter (HOSPITAL_COMMUNITY): Payer: Self-pay | Admitting: Emergency Medicine

## 2014-02-22 ENCOUNTER — Emergency Department (INDEPENDENT_AMBULATORY_CARE_PROVIDER_SITE_OTHER)
Admission: EM | Admit: 2014-02-22 | Discharge: 2014-02-22 | Disposition: A | Discharge status: Home / Self Care | Payer: 59 | Source: Home / Self Care | Attending: Family Medicine | Admitting: Family Medicine

## 2014-02-22 DIAGNOSIS — J302 Other seasonal allergic rhinitis: Secondary | ICD-10-CM

## 2014-02-22 MED ORDER — TRIAMCINOLONE ACETONIDE 40 MG/ML IJ SUSP
40.0000 mg | Freq: Once | INTRAMUSCULAR | Status: AC
  Administered 2014-02-22: 40 mg via INTRAMUSCULAR

## 2014-02-22 MED ORDER — BENZONATATE 200 MG PO CAPS: 200.0000 mg | ORAL_CAPSULE | Freq: Three times a day (TID) | ORAL | Status: DC | PRN

## 2014-02-22 MED ORDER — IPRATROPIUM BROMIDE 0.06 % NA SOLN: 2.0000 | Freq: Four times a day (QID) | NASAL | Status: DC

## 2014-02-22 MED ORDER — AZITHROMYCIN 250 MG PO TABS: ORAL_TABLET | ORAL | Status: DC

## 2014-02-22 MED ORDER — TRIAMCINOLONE ACETONIDE 40 MG/ML IJ SUSP
INTRAMUSCULAR | Status: AC
  Filled 2014-02-22: qty 1

## 2014-02-22 NOTE — Discharge Instructions (Signed)
Drink plenty of fluids as discussed, use medicine as prescribed, and mucinex or delsym for cough. Return or see your doctor if further problems °

## 2014-02-22 NOTE — ED Provider Notes (Signed)
CSN: [id number]     Arrival date & time 02/22/14  1336 History   First MD Initiated Contact with Patient 02/22/14 1459     Chief Complaint  Patient presents with  . Cough   (Consider location/radiation/quality/duration/timing/severity/associated sxs/prior Treatment) Patient is a 33 y.o. female presenting with cough. The history is provided by the patient.  Cough Cough characteristics:  Non-productive and dry Severity:  Moderate Onset quality:  Gradual Duration:  2 weeks Progression:  Unchanged Chronicity:  New Smoker: no   Context: upper respiratory infection   Relieved by:  Nothing Associated symptoms: rhinorrhea and sinus congestion   Associated symptoms: no chills, no fever, no shortness of breath, no sore throat and no wheezing     Past Medical History  Diagnosis Date  . Trichomonas   . Von Willebrand disease   . HSV-2 infection     NO RECENT OUTBREAKS  . Migraine   . Abnormal Pap smear 2010    colpo  . H/O varicella   . H/O cystitis   . H/O pyelonephritis     frequently during childhood  . Depression   . Anemia   . Blood dyscrasia     last seen when diagnosed with Von Willebrands by Dr Cyndie [place]yrs ago  . Flashers or floaters, right eye current 04/2012    being worked up for sarcoidosis of right eye   Past Surgical History  Procedure Laterality Date  . Back surgery      4 back surgeries  . Laparoscopy  2011  . Wisdom tooth extraction  2001  . Dilation and curettage of uterus    . Cesarean section  2004  2013    2 layer closure  . Multiple tooth extractions    . Ercp  03/03/2012    Procedure: ENDOSCOPIC RETROGRADE CHOLANGIOPANCREATOGRAPHY (ERCP);  Surgeon: Theda BelfastPatrick D Hung, MD;  [facility];  Service: Endoscopy;  Laterality: N/A;  dl/hung  . Cholecystectomy  03/02/2012    Procedure: LAPAROSCOPIC CHOLECYSTECTOMY WITH INTRAOPERATIVE CHOLANGIOGRAM;  Surgeon: Currie Parishristian J Streck, MD;  [facility];  Service: General;  Laterality: N/A;  .  Dilitation & currettage/hystroscopy with thermachoice ablation  04/28/2012    Procedure: DILATATION & CURETTAGE/HYSTEROSCOPY WITH THERMACHOICE ABLATION;  Surgeon: Jeani HawkingMichelle L Grewal, MD;  Location: [facility]S;  Service: Gynecology;  Laterality: N/A;   Family History  Problem Relation Age of Onset  . Stroke Mother   . Arthritis Mother   . Depression Mother   . COPD Father   . Kidney disease Paternal Uncle   . Stroke Maternal Grandfather   . Heart disease Paternal Grandfather    History  Substance Use Topics  . Smoking status: Former Smoker    Quit date: 09/29/2000  . Smokeless tobacco: Never Used  . Alcohol Use: Yes     Comment: socially   OB History   Grav Para Term Preterm Abortions TAB SAB Ect Mult Living   3 2 2  1 1    2      Review of Systems  Constitutional: Negative.  Negative for fever and chills.  HENT: Positive for congestion and rhinorrhea. Negative for sore throat.   Respiratory: Positive for cough. Negative for shortness of breath and wheezing.   Cardiovascular: Negative.     Allergies  Darvocet; Codeine; and Sulfa antibiotics  Home Medications   Prior to Admission medications   Medication Sig Start Date End Date Taking? Authorizing Provider  azithromycin (ZITHROMAX Z-PAK) 250 MG tablet Take as directed on pack 02/22/14  Linna Hoff, MD  benzonatate (TESSALON) 200 MG capsule Take 1 capsule (200 mg total) by mouth 3 (three) times daily as needed for cough. Do not chew. 02/22/14   Linna Hoff, MD  cyclobenzaprine (FLEXERIL) 10 MG tablet Take 1 tablet (10 mg total) by mouth 2 (two) times daily as needed for muscle spasms. 09/24/13   Hope Orlene Och, NP  ibuprofen (ADVIL,MOTRIN) 200 MG tablet Take 800 mg by mouth 2 (two) times daily as needed (for leg pain). For  Migraines    Historical Provider, MD  ipratropium (ATROVENT) 0.06 % nasal spray Place 2 sprays into both nostrils 4 (four) times daily. 02/22/14   Linna Hoff, MD  predniSONE (STERAPRED UNI-PAK) 10 MG  tablet Take by mouth daily. Take 6 tablets PO today then 5, 4, 3, 2, 1 09/24/13   Hope Orlene Och, NP  Pseudoephedrine-Guaifenesin [facility] D PO) Take by mouth.    Historical Provider, MD  traMADol (ULTRAM) 50 MG tablet Take 1 tablet (50 mg total) by mouth every 6 (six) hours as needed. 09/24/13   Hope Orlene Och, NP   BP 139/88  Pulse 77  Temp(Src) 98.1 F (36.7 C) (Oral)  Resp 18  SpO2 98% Physical Exam  Nursing note and vitals reviewed. Constitutional: She is oriented to person, place, and time. She appears well-developed and well-nourished.  HENT:  Head: Normocephalic.  Right Ear: External ear normal.  Left Ear: External ear normal.  Nose: Rhinorrhea present.  Mouth/Throat: Oropharynx is clear and moist.  Eyes: Pupils are equal, round, and reactive to light.  Neck: Normal range of motion. Neck supple.  Cardiovascular: Normal heart sounds.   Pulmonary/Chest: Effort normal and breath sounds normal.  Neurological: She is alert and oriented to person, place, and time.  Skin: Skin is warm and dry.    ED Course  Procedures (including critical care time) Labs Review Labs Reviewed - No data to display  Imaging Review No results found.   MDM   1. Seasonal allergic reaction        Linna Hoff, MD 02/22/14 [phone]

## 2014-02-22 NOTE — ED Notes (Signed)
2 week duration of cough, minimal relief w OTC medications

## 2014-03-11 ENCOUNTER — Encounter (HOSPITAL_COMMUNITY): Payer: Self-pay | Admitting: Emergency Medicine

## 2014-05-05 ENCOUNTER — Emergency Department (HOSPITAL_COMMUNITY)
Admission: EM | Admit: 2014-05-05 | Discharge: 2014-05-05 | Disposition: A | Discharge status: Home / Self Care | Payer: 59 | Attending: Emergency Medicine | Admitting: Emergency Medicine

## 2014-05-05 ENCOUNTER — Encounter (HOSPITAL_COMMUNITY): Payer: Self-pay

## 2014-05-05 ENCOUNTER — Emergency Department (HOSPITAL_COMMUNITY): Payer: 59

## 2014-05-05 DIAGNOSIS — S060X0A Concussion without loss of consciousness, initial encounter: Secondary | ICD-10-CM | POA: Diagnosis not present

## 2014-05-05 DIAGNOSIS — W19XXXA Unspecified fall, initial encounter: Secondary | ICD-10-CM

## 2014-05-05 DIAGNOSIS — Z8619 Personal history of other infectious and parasitic diseases: Secondary | ICD-10-CM | POA: Insufficient documentation

## 2014-05-05 DIAGNOSIS — Y998 Other external cause status: Secondary | ICD-10-CM | POA: Insufficient documentation

## 2014-05-05 DIAGNOSIS — Y9389 Activity, other specified: Secondary | ICD-10-CM | POA: Insufficient documentation

## 2014-05-05 DIAGNOSIS — S0990XA Unspecified injury of head, initial encounter: Secondary | ICD-10-CM | POA: Diagnosis present

## 2014-05-05 DIAGNOSIS — Z8679 Personal history of other diseases of the circulatory system: Secondary | ICD-10-CM | POA: Diagnosis not present

## 2014-05-05 DIAGNOSIS — Y9289 Other specified places as the place of occurrence of the external cause: Secondary | ICD-10-CM | POA: Insufficient documentation

## 2014-05-05 DIAGNOSIS — F329 Major depressive disorder, single episode, unspecified: Secondary | ICD-10-CM | POA: Diagnosis not present

## 2014-05-05 DIAGNOSIS — S3992XA Unspecified injury of lower back, initial encounter: Secondary | ICD-10-CM | POA: Insufficient documentation

## 2014-05-05 DIAGNOSIS — Z8742 Personal history of other diseases of the female genital tract: Secondary | ICD-10-CM | POA: Insufficient documentation

## 2014-05-05 DIAGNOSIS — W01198A Fall on same level from slipping, tripping and stumbling with subsequent striking against other object, initial encounter: Secondary | ICD-10-CM | POA: Diagnosis not present

## 2014-05-05 DIAGNOSIS — Z862 Personal history of diseases of the blood and blood-forming organs and certain disorders involving the immune mechanism: Secondary | ICD-10-CM | POA: Diagnosis not present

## 2014-05-05 MED ORDER — ONDANSETRON 4 MG PO TBDP
4.0000 mg | ORAL_TABLET | Freq: Once | ORAL | Status: AC
  Administered 2014-05-05: 4 mg via ORAL
  Filled 2014-05-05: qty 1

## 2014-05-05 MED ORDER — ONDANSETRON 8 MG PO TBDP: 8.0000 mg | ORAL_TABLET | Freq: Three times a day (TID) | ORAL | Status: DC | PRN

## 2014-05-05 NOTE — Discharge Instructions (Signed)
Concussion  A concussion, or closed-head injury, is a brain injury caused by a direct blow to the head or by a quick and sudden movement (jolt) of the head or neck. Concussions are usually not life-threatening. Even so, the effects of a concussion can be serious. If you have had a concussion before, you are more likely to experience concussion-like symptoms after a direct blow to the head.   CAUSES  · Direct blow to the head, such as from running into another player during a soccer game, being hit in a fight, or hitting your head on a hard surface.  · A jolt of the head or neck that causes the brain to move back and forth inside the skull, such as in a car crash.  SIGNS AND SYMPTOMS  The signs of a concussion can be hard to notice. Early on, they may be missed by you, family members, and health care providers. You may look fine but act or feel differently.  Symptoms are usually temporary, but they may last for days, weeks, or even longer. Some symptoms may appear right away while others may not show up for hours or days. Every head injury is different. Symptoms include:  · Mild to moderate headaches that will not go away.  · A feeling of pressure inside your head.  · Having more trouble than usual:  ¨ Learning or remembering things you have heard.  ¨ Answering questions.  ¨ Paying attention or concentrating.  ¨ Organizing daily tasks.  ¨ Making decisions and solving problems.  · Slowness in thinking, acting or reacting, speaking, or reading.  · Getting lost or being easily confused.  · Feeling tired all the time or lacking energy (fatigued).  · Feeling drowsy.  · Sleep disturbances.  ¨ Sleeping more than usual.  ¨ Sleeping less than usual.  ¨ Trouble falling asleep.  ¨ Trouble sleeping (insomnia).  · Loss of balance or feeling lightheaded or dizzy.  · Nausea or vomiting.  · Numbness or tingling.  · Increased sensitivity to:  ¨ Sounds.  ¨ Lights.  ¨ Distractions.  · Vision problems or eyes that tire  easily.  · Diminished sense of taste or smell.  · Ringing in the ears.  · Mood changes such as feeling sad or anxious.  · Becoming easily irritated or angry for little or no reason.  · Lack of motivation.  · Seeing or hearing things other people do not see or hear (hallucinations).  DIAGNOSIS  Your health care provider can usually diagnose a concussion based on a description of your injury and symptoms. He or she will ask whether you passed out (lost consciousness) and whether you are having trouble remembering events that happened right before and during your injury.  Your evaluation might include:  · A brain scan to look for signs of injury to the brain. Even if the test shows no injury, you may still have a concussion.  · Blood tests to be sure other problems are not present.  TREATMENT  · Concussions are usually treated in an emergency department, in urgent care, or at a clinic. You may need to stay in the hospital overnight for further treatment.  · Tell your health care provider if you are taking any medicines, including prescription medicines, over-the-counter medicines, and natural remedies. Some medicines, such as blood thinners (anticoagulants) and aspirin, may increase the chance of complications. Also tell your health care provider whether you have had alcohol or are taking illegal drugs. This information   may affect treatment.  · Your health care provider will send you home with important instructions to follow.  · How fast you will recover from a concussion depends on many factors. These factors include how severe your concussion is, what part of your brain was injured, your age, and how healthy you were before the concussion.  · Most people with mild injuries recover fully. Recovery can take time. In general, recovery is slower in older persons. Also, persons who have had a concussion in the past or have other medical problems may find that it takes longer to recover from their current injury.  HOME  CARE INSTRUCTIONS  General Instructions  · Carefully follow the directions your health care provider gave you.  · Only take over-the-counter or prescription medicines for pain, discomfort, or fever as directed by your health care provider.  · Take only those medicines that your health care provider has approved.  · Do not drink alcohol until your health care provider says you are well enough to do so. Alcohol and certain other drugs may slow your recovery and can put you at risk of further injury.  · If it is harder than usual to remember things, write them down.  · If you are easily distracted, try to do one thing at a time. For example, do not try to watch TV while fixing dinner.  · Talk with family members or close friends when making important decisions.  · Keep all follow-up appointments. Repeated evaluation of your symptoms is recommended for your recovery.  · Watch your symptoms and tell others to do the same. Complications sometimes occur after a concussion. Older adults with a brain injury may have a higher risk of serious complications, such as a blood clot on the brain.  · Tell your teachers, school nurse, school counselor, coach, athletic trainer, or work manager about your injury, symptoms, and restrictions. Tell them about what you can or cannot do. They should watch for:  ¨ Increased problems with attention or concentration.  ¨ Increased difficulty remembering or learning new information.  ¨ Increased time needed to complete tasks or assignments.  ¨ Increased irritability or decreased ability to cope with stress.  ¨ Increased symptoms.  · Rest. Rest helps the brain to heal. Make sure you:  ¨ Get plenty of sleep at night. Avoid staying up late at night.  ¨ Keep the same bedtime hours on weekends and weekdays.  ¨ Rest during the day. Take daytime naps or rest breaks when you feel tired.  · Limit activities that require a lot of thought or concentration. These include:  ¨ Doing homework or job-related  work.  ¨ Watching TV.  ¨ Working on the computer.  · Avoid any situation where there is potential for another head injury (football, hockey, soccer, basketball, martial arts, downhill snow sports and horseback riding). Your condition will get worse every time you experience a concussion. You should avoid these activities until you are evaluated by the appropriate follow-up health care providers.  Returning To Your Regular Activities  You will need to return to your normal activities slowly, not all at once. You must give your body and brain enough time for recovery.  · Do not return to sports or other athletic activities until your health care provider tells you it is safe to do so.  · Ask your health care provider when you can drive, ride a bicycle, or operate heavy machinery. Your ability to react may be slower after a   brain injury. Never do these activities if you are dizzy.  · Ask your health care provider about when you can return to work or school.  Preventing Another Concussion  It is very important to avoid another brain injury, especially before you have recovered. In rare cases, another injury can lead to permanent brain damage, brain swelling, or death. The risk of this is greatest during the first 7-10 days after a head injury. Avoid injuries by:  · Wearing a seat belt when riding in a car.  · Drinking alcohol only in moderation.  · Wearing a helmet when biking, skiing, skateboarding, skating, or doing similar activities.  · Avoiding activities that could lead to a second concussion, such as contact or recreational sports, until your health care provider says it is okay.  · Taking safety measures in your home.  ¨ Remove clutter and tripping hazards from floors and stairways.  ¨ Use grab bars in bathrooms and handrails by stairs.  ¨ Place non-slip mats on floors and in bathtubs.  ¨ Improve lighting in dim areas.  SEEK MEDICAL CARE IF:  · You have increased problems paying attention or  concentrating.  · You have increased difficulty remembering or learning new information.  · You need more time to complete tasks or assignments than before.  · You have increased irritability or decreased ability to cope with stress.  · You have more symptoms than before.  Seek medical care if you have any of the following symptoms for more than 2 weeks after your injury:  · Lasting (chronic) headaches.  · Dizziness or balance problems.  · Nausea.  · Vision problems.  · Increased sensitivity to noise or light.  · Depression or mood swings.  · Anxiety or irritability.  · Memory problems.  · Difficulty concentrating or paying attention.  · Sleep problems.  · Feeling tired all the time.  SEEK IMMEDIATE MEDICAL CARE IF:  · You have severe or worsening headaches. These may be a sign of a blood clot in the brain.  · You have weakness (even if only in one hand, leg, or part of the face).  · You have numbness.  · You have decreased coordination.  · You vomit repeatedly.  · You have increased sleepiness.  · One pupil is larger than the other.  · You have convulsions.  · You have slurred speech.  · You have increased confusion. This may be a sign of a blood clot in the brain.  · You have increased restlessness, agitation, or irritability.  · You are unable to recognize people or places.  · You have neck pain.  · It is difficult to wake you up.  · You have unusual behavior changes.  · You lose consciousness.  MAKE SURE YOU:  · Understand these instructions.  · Will watch your condition.  · Will get help right away if you are not doing well or get worse.  Document Released: 07/17/2003 Document Revised: 05/01/2013 Document Reviewed: 11/16/2012  [employer]® Patient Information ©2015 [employer]. This information is not intended to replace advice given to you by your health care provider. Make sure you discuss any questions you have with your health care provider.

## 2014-05-05 NOTE — ED Provider Notes (Signed)
CSN: [id number]     Arrival date & time 05/05/14  1648 History   First MD Initiated Contact with Patient 05/05/14 1857     Chief Complaint  Patient presents with  . Fall  . Emesis  . Headache     (Consider location/radiation/quality/duration/timing/severity/associated sxs/prior Treatment) Patient is a 33 y.o. female presenting with fall, vomiting, and headaches. The history is provided by the patient.  Fall This is a new problem. Associated symptoms include headaches. Pertinent negatives include no chest pain, no abdominal pain and no shortness of breath.  Emesis Associated symptoms: headaches   Associated symptoms: no abdominal pain and no diarrhea   Headache Associated symptoms: cough, nausea and vomiting   Associated symptoms: no abdominal pain, no back pain, no diarrhea, no pain, no neck stiffness and no numbness    patient was on a "hoverboard' on Christmas Day and fell backwards. She has pain in her lower back and her head. She states initially there was dull pain but is gotten worse. States she has had some vomiting and aches all over. No confusion. No numbness or weakness that is new. She states she always has some weakness in her left leg. No chest pain. No trouble breathing. She has a history of von Willebrand disease.  Past Medical History  Diagnosis Date  . Trichomonas   . Von Willebrand disease   . HSV-2 infection     NO RECENT OUTBREAKS  . Migraine   . Abnormal Pap smear 2010    [facility]  . H/O varicella   . H/O cystitis   . H/O pyelonephritis     frequently during childhood  . Depression   . Anemia   . Blood dyscrasia     last seen when diagnosed with Von Willebrands by Dr Cyndie ChimeGranfortuna 1849yrs ago  . Flashers or floaters, right eye current 04/2012    being worked up for sarcoidosis of right eye   Past Surgical History  Procedure Laterality Date  . Back surgery      4 back surgeries  . Laparoscopy  2011  . Wisdom tooth extraction  2001  . Dilation and  curettage of uterus    . Cesarean section  2004  2013    2 layer closure  . Multiple tooth extractions    . Ercp  03/03/2012    Procedure: ENDOSCOPIC RETROGRADE CHOLANGIOPANCREATOGRAPHY (ERCP);  Surgeon: Theda BelfastPatrick D Hung, MD;  [facility];  Service: Endoscopy;  Laterality: N/A;  dl/hung  . Cholecystectomy  03/02/2012    Procedure: LAPAROSCOPIC CHOLECYSTECTOMY WITH INTRAOPERATIVE CHOLANGIOGRAM;  Surgeon: Currie Parishristian J Streck, MD;  [facility];  Service: General;  Laterality: N/A;  . Dilitation & currettage/hystroscopy with thermachoice ablation  04/28/2012    Procedure: DILATATION & CURETTAGE/HYSTEROSCOPY WITH THERMACHOICE ABLATION;  Surgeon: Jeani HawkingMichelle L Grewal, MD;  Location: [facility] ORS;  Service: Gynecology;  Laterality: N/A;   Family History  Problem Relation Age of Onset  . Stroke Mother   . Arthritis Mother   . Depression Mother   . COPD Father   . Kidney disease Paternal Uncle   . Stroke Maternal Grandfather   . Heart disease Paternal Grandfather    History  Substance Use Topics  . Smoking status: Former Smoker    Quit date: 09/29/2000  . Smokeless tobacco: Never Used  . Alcohol Use: Yes     Comment: socially   OB History    Gravida Para Term Preterm AB TAB SAB Ectopic Multiple Living   3 2 2   1  1    2     Review of Systems  Constitutional: Negative for activity change and appetite change.  Eyes: Negative for pain.  Respiratory: Positive for cough. Negative for chest tightness and shortness of breath.   Cardiovascular: Negative for chest pain and leg swelling.  Gastrointestinal: Positive for nausea and vomiting. Negative for abdominal pain and diarrhea.  Genitourinary: Negative for flank pain.  Musculoskeletal: Negative for back pain and neck stiffness.  Skin: Negative for rash.  Neurological: Positive for headaches. Negative for weakness and numbness.  Hematological: Bruises/bleeds easily.  Psychiatric/Behavioral: Negative for behavioral problems.       Allergies  Darvocet; Codeine; and Sulfa antibiotics  Home Medications   Prior to Admission medications   Medication Sig Start Date End Date Taking? Authorizing Provider  ALPRAZolam Prudy Feeler) 0.5 MG tablet Take 0.5 mg by mouth as needed. 02/27/14  Yes Historical Provider, MD  Diethylpropion HCl CR 75 MG TB24 Take 75 mg by mouth daily. 04/13/14  Yes Historical Provider, MD  ibuprofen (ADVIL,MOTRIN) 200 MG tablet Take 800 mg by mouth 2 (two) times daily as needed (for leg pain). For  Migraines   Yes Historical Provider, MD  ipratropium (ATROVENT) 0.06 % nasal spray Place 2 sprays into both nostrils 4 (four) times daily. 02/22/14  Yes Linna Hoff, MD  traMADol (ULTRAM) 50 MG tablet Take 1 tablet (50 mg total) by mouth every 6 (six) hours as needed. 09/24/13  Yes Hope Orlene Och, NP  azithromycin (ZITHROMAX Z-PAK) 250 MG tablet Take as directed on pack Patient not taking: Reported on 05/05/2014 02/22/14   Linna Hoff, MD  benzonatate (TESSALON) 200 MG capsule Take 1 capsule (200 mg total) by mouth 3 (three) times daily as needed for cough. Do not chew. Patient not taking: Reported on 05/05/2014 02/22/14   Linna Hoff, MD  cyclobenzaprine (FLEXERIL) 10 MG tablet Take 1 tablet (10 mg total) by mouth 2 (two) times daily as needed for muscle spasms. Patient not taking: Reported on 05/05/2014 09/24/13   Janne Napoleon, NP  ondansetron (ZOFRAN-ODT) 8 MG disintegrating tablet Take 1 tablet (8 mg total) by mouth every 8 (eight) hours as needed for nausea or vomiting. 05/05/14   Juliet Rude. Melinda Pottinger, MD  predniSONE (STERAPRED UNI-PAK) 10 MG tablet Take by mouth daily. Take 6 tablets PO today then 5, 4, 3, 2, 1 Patient not taking: Reported on 05/05/2014 09/24/13   Janne Napoleon, NP   BP 125/77 mmHg  Pulse 67  Temp(Src) 98.1 F (36.7 C) (Oral)  Resp 20  Ht 5\' 3"  (1.6 m)  Wt 252 lb (114.306 kg)  BMI 44.65 kg/m2  SpO2 100% Physical Exam  Constitutional: She is oriented to person, place, and time.  She appears well-developed and well-nourished.  HENT:  Head: Normocephalic.  Mild tenderness occiput on left.   Eyes: EOM are normal. Pupils are equal, round, and reactive to light.  Neck: Normal range of motion. Neck supple.  Cardiovascular: Normal rate, regular rhythm and normal heart sounds.   No murmur heard. Pulmonary/Chest: Effort normal and breath sounds normal. No respiratory distress. She has no wheezes. She has no rales.  Abdominal: Soft. Bowel sounds are normal. She exhibits no distension. There is no tenderness. There is no rebound and no guarding.  Musculoskeletal: Normal range of motion. She exhibits tenderness.  Mild tenderness of left lower back without step-off or deformity.  Neurological: She is alert and oriented to person, place, and time. No cranial nerve deficit.  Skin:  Skin is warm and dry.  Psychiatric: She has a normal mood and affect. Her speech is normal.  Nursing note and vitals reviewed.   ED Course  Procedures (including critical care time) Labs Review Labs Reviewed - No data to display  Imaging Review Dg Lumbar Spine Complete  05/05/2014   CLINICAL DATA:  Fall 2 days previous on to the patient's back with back and left-sided leg pain, known history of prior lumbar surgery, initial encounter  EXAM: LUMBAR SPINE - COMPLETE 4+ VIEW  COMPARISON:  01/22/2014  FINDINGS: Postsurgical changes are noted at L4-5 and L5-S1. The hardware appears within normal limits. Mild degenerative changes are seen. No acute fracture or acute facet abnormality is noted. No soft tissue changes are seen.  IMPRESSION: Stable postoperative change.  No acute abnormality is noted.   Electronically Signed   By: Alcide Clever M.D.   On: 05/05/2014 20:03   Ct Head Wo Contrast  05/05/2014   CLINICAL DATA:  Recent fall with occipital injury: 05/03/2014, headache, initial encounter  EXAM: CT HEAD WITHOUT CONTRAST  TECHNIQUE: Contiguous axial images were obtained from the base of the skull  through the vertex without intravenous contrast.  COMPARISON:  03/06/2012  FINDINGS: The bony calvarium is intact. The ventricles are of normal size and configuration. No findings to suggest acute hemorrhage, acute infarction or space-occupying mass lesion are noted.  IMPRESSION: No acute abnormality noted.   Electronically Signed   By: Alcide Clever M.D.   On: 05/05/2014 20:24     EKG Interpretation None      MDM   Final diagnoses:  Fall  Concussion, without loss of consciousness, initial encounter    Patient with fall. Striking her head. Normal neurologic exam. Has von Willebrand's disease and CT scan done, which shows no bleeding. Also has lumbar pain and x-rays stable. Will discharge home. Diagnosis concussion since is having headache and vomiting after hitting her head.    Juliet Rude. Rubin Payor, MD 05/05/14 2104

## 2014-05-05 NOTE — ED Notes (Addendum)
Pt states she was attempting to ride her daughter's balance board on 12/25.  Pt states she fell landing on her back and striking her head on a hardwood floor.  Pt states she was sore on 12/25.  Pt then developed back pain and headaches.  Today pt has been vomiting and has a headache.  Pt also reports she's had multiple back surgeries.  Neurologically intact.  Pt is alert and oriented and in NAD.

## 2015-02-10 ENCOUNTER — Telehealth: Payer: Self-pay | Admitting: *Deleted

## 2015-02-10 ENCOUNTER — Other Ambulatory Visit: Payer: Self-pay | Admitting: Oncology

## 2015-02-10 DIAGNOSIS — D6800 Von Willebrand disease, unspecified: Secondary | ICD-10-CM

## 2015-02-10 NOTE — Telephone Encounter (Signed)
Dr Cyndie Chime said he can see pt tomorrow @ [phone]; pt called / informed - who stated she can be here tomorrow.

## 2015-02-10 NOTE — Telephone Encounter (Addendum)
Returned pt's - stated she is scheduled for surgery on the 20th which might be cancelled; stated was told she need to schedule an appt with you. Said she has had several surgeries over the last 10 yrs; they only needed to know how much DDAVP  And an appt was not required.  Last seen in 2005. Wants to know what's going on. Telephone# [phone].

## 2015-02-11 ENCOUNTER — Other Ambulatory Visit: Payer: Self-pay | Admitting: Oncology

## 2015-02-11 ENCOUNTER — Encounter: Payer: Self-pay | Admitting: Oncology

## 2015-02-11 ENCOUNTER — Telehealth: Payer: Self-pay | Admitting: *Deleted

## 2015-02-11 ENCOUNTER — Ambulatory Visit (INDEPENDENT_AMBULATORY_CARE_PROVIDER_SITE_OTHER): Payer: 59 | Admitting: Oncology

## 2015-02-11 DIAGNOSIS — D6800 Von Willebrand disease, unspecified: Secondary | ICD-10-CM

## 2015-02-11 DIAGNOSIS — D6801 Von willebrand disease, type 1: Secondary | ICD-10-CM

## 2015-02-11 LAB — APTT: APTT: 34 s (ref 24–37)

## 2015-02-11 NOTE — Telephone Encounter (Signed)
Returned pt's call - she wanted to know if Dr Cyndie Chime will be available on Dec 1; b/c Dr Odis Luster will not do her surgery unless he is in town per pt. Dr Cyndie Chime will be out of town on this date. Dr Cyndie Chime said he will call and talk to Dr Odis Luster - this was relayed to the pt.

## 2015-02-11 NOTE — Patient Instructions (Signed)
To lab today Return to Hematology Dr Reece Agar  In November

## 2015-02-11 NOTE — Progress Notes (Signed)
Patient ID: Ann [name], female   DOB: [birth year], 34 y.o.   MRN: [id number] Hematology and Oncology Follow Up Visit  Ann [name] [id number] Aug 04, 1980 34 y.o. 02/11/2015 7:02 PM   Principle Diagnosis: Encounter Diagnosis  Name Primary?  Scot Jun Willebrand disease - hx PPH      Interim History:  34 year old woman with mild type I von Willebrand's disorder who have not seen in over 10 years. She was seen for an initial hospital consultation. I have given advice to her surgeons over the years by phone but she is never come back for a follow-up visit. I got a call yesterday from Dr. Etter Sjogren, plastic surgery. Patient is scheduled to have reduction mammoplasty on October 20. She has responded nicely to DDAVP in the past with good hemostasis around multiple surgical procedures. Records of the consultation that I did many years ago no longer accessible in the current chart record. Since that time she has had a cesarean section in May 2013, laparoscopic cholecystectomy in October 2013, ERCP October 2013, D and see procedure on 04/28/2012. She has had multiple dental procedures. She has had 3 back surgeries. She has had multiple tattoos on her skin without any excessive bleeding. She remembers that she had complications with the gallbladder surgery. This likely relates to hyponatremia that developed.she had no surgical complications. No excessive bleeding. Her daughter is now 19 years old and tested positive for von Willebrand's. Her son is 4 and negative for von Willebrand's but positive for sickle cell trait. Mother age 6 negative. Father age 30 had borderline decreased factor VIII levels with normal von Willebrand's antigen and ristocetin cofactor activity and may have mild VW disease. He was never retested.  She has had no clinical bleeding and denies epistaxis, gum bleeding, hematochezia or melena, hematuria, or vaginal bleeding. She did have an endometrial ablation procedure for  menorrhagia in the past.   Medications: reviewed  Allergies:  Allergies  Allergen Reactions  . Darvocet [Propoxyphene N-Acetaminophen] Nausea And Vomiting  . Codeine Nausea And Vomiting and Rash  . Sulfa Antibiotics Rash    Review of Systems:  Remaining ROS negative:   Physical Exam: Blood pressure 131/81, pulse 88, temperature 98.6 F (37 C), temperature source Oral, height  (1.6 m), weight 252 lb 11.2 oz (114.624 kg), SpO2 100 %. Wt Readings from Last 3 Encounters:  02/11/15 252 lb 11.2 oz (114.624 kg)  05/05/14 252 lb (114.306 kg)  04/24/12 245 lb (111.131 kg)     General appearance: overweight African-American woman HENNT: Pharynx no erythema, exudate, mass, or ulcer. No thyromegaly or thyroid nodules Lymph nodes: No cervical, supraclavicular, or axillary lymphadenopathy Breasts:  Lungs: Clear to auscultation, resonant to percussion throughout Heart: Regular rhythm, no murmur, no gallop, no rub, no click, no edema Abdomen: Soft, nontender, normal bowel sounds, no mass, no organomegaly Extremities: No edema, no calf tenderness Musculoskeletal: no joint deformities GU:  Vascular: Carotid pulses 2+, no bruits,  Neurologic: Alert, oriented, PERRLA, optic discs sharp and vessels normal, no hemorrhage or exudate, cranial nerves grossly normal, motor strength 5 over 5, reflexes 1+ symmetric, upper body coordination normal, gait normal, Skin: No rash or ecchymosis. Multiple tattoos including one inside her right ear.  Lab Results: CBC W/Diff    Component Value Date/Time   WBC 8.7 04/27/2012 1402   RBC 4.05 04/27/2012 1402   HGB 11.8* 04/27/2012 1402   HCT 36.9 04/27/2012 1402   PLT 353 04/27/2012 1402   MCV 91.1 04/27/2012 1402  MCH 29.1 04/27/2012 1402   MCHC 32.0 04/27/2012 1402   RDW 14.5 04/27/2012 1402   LYMPHSABS 0.6* 04/19/2011 2259   MONOABS 0.7 04/19/2011 2259   EOSABS 0.0 04/19/2011 2259   BASOSABS 0.0 04/19/2011 2259     Chemistry       Component Value Date/Time   NA 136 03/07/2012 0430   K 4.3 03/07/2012 0430   CL 102 03/07/2012 0430   CO2 26 03/07/2012 0430   BUN 8 03/07/2012 0430   CREATININE 0.81 03/07/2012 0430   CREATININE 0.59 09/07/2011 1801      Component Value Date/Time   CALCIUM 9.4 03/07/2012 0430   ALKPHOS 181* 03/04/2012 0615   AST 171* 03/04/2012 0615   ALT 284* 03/04/2012 0615   BILITOT 0.9 03/04/2012 0615      Impression:  Type I from Willebrand's disorder-mild.  Recommendation: Based on weight 252 pounds, 126 kg, I would use DDAVP 0.3 mcg/kg equals 38 g IV in 100 cc normal saline over 30 minutes one hour prior to surgery. Repeat the same dose 24 hours later. Then the patient discharged, use concentrated nasal DDAVP spray 1.5 mg per mL 1 spray, each nostril, daily 3 additional days. Fluid restriction while on DDAVP both in the hospital and at home to avoid the anti-diuretic effects leading to hyponatremia.  Local pharmacy, [facility], can procure the nasal spray but the patient would have to pay a $540 co-pay upfront. After she left the office, she found that she does still have a vial of the nasal spray that has not yet expired.   CC: Patient Care Team: Assunta Found, MD as PCP - General (Family Medicine)   Levert Feinstein, MD 10/4/20167:02 PM

## 2015-02-12 ENCOUNTER — Other Ambulatory Visit: Payer: Self-pay | Admitting: Oncology

## 2015-02-12 LAB — COMPREHENSIVE METABOLIC PANEL
ALBUMIN: 4.7 g/dL (ref 3.5–5.5)
ALK PHOS: 66 IU/L (ref 39–117)
ALT: 23 IU/L (ref 0–32)
AST: 18 IU/L (ref 0–40)
Albumin/Globulin Ratio: 1.7 (ref 1.1–2.5)
BILIRUBIN TOTAL: 0.6 mg/dL (ref 0.0–1.2)
BUN / CREAT RATIO: 9 (ref 8–20)
BUN: 7 mg/dL (ref 6–20)
CHLORIDE: 101 mmol/L (ref 97–108)
CO2: 21 mmol/L (ref 18–29)
Calcium: 9.3 mg/dL (ref 8.7–10.2)
Creatinine, Ser: 0.77 mg/dL (ref 0.57–1.00)
GFR calc Af Amer: 115 mL/min/{1.73_m2} (ref >59)
GFR calc non Af Amer: 100 mL/min/{1.73_m2} (ref >59)
GLOBULIN, TOTAL: 2.7 g/dL (ref 1.5–4.5)
GLUCOSE: 89 mg/dL (ref 65–99)
Potassium: 4.7 mmol/L (ref 3.5–5.2)
SODIUM: 140 mmol/L (ref 134–144)
Total Protein: 7.4 g/dL (ref 6.0–8.5)

## 2015-02-12 LAB — CBC WITH DIFFERENTIAL/PLATELET
BASOS ABS: 0 10*3/uL (ref 0.0–0.2)
Basos: 0 %
EOS (ABSOLUTE): 0.1 10*3/uL (ref 0.0–0.4)
Eos: 1 %
HEMOGLOBIN: 12.6 g/dL (ref 11.1–15.9)
Hematocrit: 38.4 % (ref 34.0–46.6)
Immature Grans (Abs): 0 10*3/uL (ref 0.0–0.1)
Immature Granulocytes: 0 %
LYMPHS ABS: 3.8 10*3/uL — AB (ref 0.7–3.1)
Lymphs: 40 %
MCH: 31.1 pg (ref 26.6–33.0)
MCHC: 32.8 g/dL (ref 31.5–35.7)
MCV: 95 fL (ref 79–97)
MONOCYTES: 5 %
MONOS ABS: 0.5 10*3/uL (ref 0.1–0.9)
NEUTROS ABS: 5.2 10*3/uL (ref 1.4–7.0)
Neutrophils: 54 %
Platelets: 320 10*3/uL (ref 150–379)
RBC: 4.05 x10E6/uL (ref 3.77–5.28)
RDW: 13.2 % (ref 12.3–15.4)
WBC: 9.6 10*3/uL (ref 3.4–10.8)

## 2015-02-14 ENCOUNTER — Other Ambulatory Visit (HOSPITAL_COMMUNITY): Payer: Self-pay | Admitting: Plastic Surgery

## 2015-02-21 ENCOUNTER — Encounter (HOSPITAL_COMMUNITY): Payer: Self-pay

## 2015-02-21 ENCOUNTER — Encounter (HOSPITAL_COMMUNITY)
Admission: RE | Admit: 2015-02-21 | Discharge: 2015-02-21 | Disposition: A | Discharge status: Home / Self Care | Payer: 59 | Source: Ambulatory Visit | Attending: Plastic Surgery | Admitting: Plastic Surgery

## 2015-02-21 DIAGNOSIS — N62 Hypertrophy of breast: Secondary | ICD-10-CM | POA: Insufficient documentation

## 2015-02-21 DIAGNOSIS — Z01818 Encounter for other preprocedural examination: Secondary | ICD-10-CM | POA: Insufficient documentation

## 2015-02-21 DIAGNOSIS — I491 Atrial premature depolarization: Secondary | ICD-10-CM | POA: Insufficient documentation

## 2015-02-21 DIAGNOSIS — Z87891 Personal history of nicotine dependence: Secondary | ICD-10-CM | POA: Insufficient documentation

## 2015-02-21 DIAGNOSIS — J45909 Unspecified asthma, uncomplicated: Secondary | ICD-10-CM | POA: Insufficient documentation

## 2015-02-21 DIAGNOSIS — D680 Von Willebrand's disease: Secondary | ICD-10-CM | POA: Insufficient documentation

## 2015-02-21 DIAGNOSIS — Z01812 Encounter for preprocedural laboratory examination: Secondary | ICD-10-CM | POA: Insufficient documentation

## 2015-02-21 HISTORY — DX: Other specified behavioral and emotional disorders with onset usually occurring in childhood and adolescence: F98.8

## 2015-02-21 HISTORY — DX: Nocturia: R35.1

## 2015-02-21 HISTORY — DX: Anxiety disorder, unspecified: F41.9

## 2015-02-21 HISTORY — DX: Other specified postprocedural states: Z98.890

## 2015-02-21 HISTORY — DX: Unspecified hemorrhoids: K64.9

## 2015-02-21 HISTORY — DX: Pneumonia, unspecified organism: J18.9

## 2015-02-21 HISTORY — DX: Unspecified asthma, uncomplicated: J45.909

## 2015-02-21 HISTORY — DX: Nausea with vomiting, unspecified: R11.2

## 2015-02-21 LAB — CBC
HCT: 38.7 % (ref 36.0–46.0)
Hemoglobin: 12.5 g/dL (ref 12.0–15.0)
MCH: 30.4 pg (ref 26.0–34.0)
MCHC: 32.3 g/dL (ref 30.0–36.0)
MCV: 94.2 fL (ref 78.0–100.0)
Platelets: 293 10*3/uL (ref 150–400)
RBC: 4.11 MIL/uL (ref 3.87–5.11)
RDW: 12.5 % (ref 11.5–15.5)
WBC: 9.2 10*3/uL (ref 4.0–10.5)

## 2015-02-21 LAB — BASIC METABOLIC PANEL
Anion gap: 8 (ref 5–15)
BUN: 12 mg/dL (ref 6–20)
CALCIUM: 9.5 mg/dL (ref 8.9–10.3)
CHLORIDE: 107 mmol/L (ref 101–111)
CO2: 24 mmol/L (ref 22–32)
CREATININE: 0.8 mg/dL (ref 0.44–1.00)
GFR calc non Af Amer: >60 mL/min (ref >60)
Glucose, Bld: 76 mg/dL (ref 65–99)
Potassium: 3.9 mmol/L (ref 3.5–5.1)
SODIUM: 139 mmol/L (ref 135–145)

## 2015-02-21 LAB — HCG, SERUM, QUALITATIVE: PREG SERUM: NEGATIVE

## 2015-02-21 NOTE — Progress Notes (Addendum)
Medical Md is Dr.John Phillips Odor  Echo denies ever having one  Stress test denies ever having one   Heart cath denies ever having one  EKG denies having one in the past yr  CXR denies having one in the past yr  Cardiologist denies having one

## 2015-02-21 NOTE — Pre-Procedure Instructions (Signed)
Ann [name]  02/21/2015      [id number] - [place], [place] - [address] AT [facility] OF [address] [place] [address] Phone: [phone] Fax: [phone]    Your procedure is scheduled on Thurs, Oct 20 @ 7:30 AM  Report to [facility] Admitting at 5:30 AM  Call this number if you have problems the morning of surgery:  [phone]   Remember:  Do not eat food or drink liquids after midnight.  Take these medicines the morning of surgery with A SIP OF WATER Xanax(Alprazolam),Adderall(Amphetamine-Dextroamphetamine),and Eye Drops               No Goody's,BC's,Aleve,Aspirin,Ibuprofen,Fish Oil,or any Herbal Medications.    Do not wear jewelry, make-up or nail polish.  Do not wear lotions, powders, or perfumes.    Do not shave 48 hours prior to surgery.    Do not bring valuables to the hospital.  [facility] is not responsible for any belongings or valuables.  Contacts, dentures or bridgework may not be worn into surgery.  Leave your suitcase in the car.  After surgery it may be brought to your room.  For patients admitted to the hospital, discharge time will be determined by your treatment team.  Patients discharged the day of surgery will not be allowed to drive home.    Special instructions:  [facility] - Preparing for Surgery  Before surgery, you can play an important role.  Because skin is not sterile, your skin needs to be as free of germs as possible.  You can reduce the number of germs on you skin by washing with CHG (chlorahexidine gluconate) soap before surgery.  CHG is an antiseptic cleaner which kills germs and bonds with the skin to continue killing germs even after washing.  Please DO NOT use if you have an allergy to CHG or antibacterial soaps.  If your skin becomes reddened/irritated stop using the CHG and inform your nurse when you arrive at Short Stay.  Do not shave (including  legs and underarms) for at least 48 hours prior to the first CHG shower.  You may shave your face.  Please follow these instructions carefully:   1.  Shower with CHG Soap the night before surgery and the                                morning of Surgery.  2.  If you choose to wash your hair, wash your hair first as usual with your       normal shampoo.  3.  After you shampoo, rinse your hair and body thoroughly to remove the                      Shampoo.  4.  Use CHG as you would any other liquid soap.  You can apply chg directly       to the skin and wash gently with scrungie or a clean washcloth.  5.  Apply the CHG Soap to your body ONLY FROM THE NECK DOWN.        Do not use on open wounds or open sores.  Avoid contact with your eyes,       ears, mouth and genitals (private parts).  Wash genitals (private parts)       with your normal soap.  6.  Wash thoroughly, paying special attention to the area where your surgery        will be performed.  7.  Thoroughly rinse your body with warm water from the neck down.  8.  DO NOT shower/wash with your normal soap after using and rinsing off       the CHG Soap.  9.  Pat yourself dry with a clean towel.            10.  Wear clean pajamas.            11.  Place clean sheets on your bed the night of your first shower and do not        sleep with pets.  Day of Surgery  Do not apply any lotions/deoderants the morning of surgery.  Please wear clean clothes to the hospital/surgery center.    Please read over the following fact sheets that you were given. Pain Booklet, Coughing and Deep Breathing and Surgical Site Infection Prevention

## 2015-02-21 NOTE — Progress Notes (Signed)
Anesthesia Chart Review:  Pt is 34 year old female scheduled for B breast reduction on 02/27/2015 with Dr. Odis Luster.   PMH includes: Von Willebrand disease, asthma, anemia. Former smoker. BMI 44. S/p C&C 04/28/12. S/p laparoscopic cholecystectomy 03/02/12. S/p C/S with BTL 09/10/11.   Preoperative labs reviewed.    EKG 02/21/2015: Sinus rhythm with PACs  Pt saw Dr. Cyndie Chime 02/11/15 for guidance on Von Willebrand disease management perioperatively.  Recommendation: Based on weight 252 pounds, 126 kg, I would use DDAVP 0.3 mcg/kg equals 38 g IV in 100 cc normal saline over 30 minutes one hour prior to surgery. Repeat the same dose 24 hours later. Then the patient discharged, use concentrated nasal DDAVP spray 1.5 mg per mL 1 spray, each nostril, daily 3 additional days (pt has nasal spray). Fluid restriction while on DDAVP both in the hospital and at home to avoid the anti-diuretic effects leading to hyponatremia.  If no changes, I anticipate pt can proceed with surgery as scheduled.   Rica Mast, FNP-BC [facility]/Anesthesiology Phone: [phone] 02/21/2015 2:07 PM

## 2015-02-27 ENCOUNTER — Encounter (HOSPITAL_COMMUNITY)
Admission: RE | Disposition: A | Discharge status: Home / Self Care | Payer: Self-pay | Source: Ambulatory Visit | Attending: Plastic Surgery

## 2015-02-27 ENCOUNTER — Ambulatory Visit (HOSPITAL_COMMUNITY): Payer: 59 | Admitting: Certified Registered"

## 2015-02-27 ENCOUNTER — Ambulatory Visit (HOSPITAL_COMMUNITY): Payer: 59 | Admitting: Emergency Medicine

## 2015-02-27 ENCOUNTER — Encounter (HOSPITAL_COMMUNITY): Payer: Self-pay | Admitting: Certified Registered Nurse Anesthetist

## 2015-02-27 ENCOUNTER — Ambulatory Visit (HOSPITAL_COMMUNITY)
Admission: RE | Admit: 2015-02-27 | Discharge: 2015-02-28 | Disposition: A | Discharge status: Home / Self Care | Payer: 59 | Source: Ambulatory Visit | Attending: Plastic Surgery | Admitting: Plastic Surgery

## 2015-02-27 DIAGNOSIS — Z87891 Personal history of nicotine dependence: Secondary | ICD-10-CM | POA: Insufficient documentation

## 2015-02-27 DIAGNOSIS — D6801 Von willebrand disease, type 1: Secondary | ICD-10-CM

## 2015-02-27 DIAGNOSIS — N62 Hypertrophy of breast: Secondary | ICD-10-CM | POA: Insufficient documentation

## 2015-02-27 DIAGNOSIS — D66 Hereditary factor VIII deficiency: Secondary | ICD-10-CM | POA: Insufficient documentation

## 2015-02-27 DIAGNOSIS — Z01818 Encounter for other preprocedural examination: Secondary | ICD-10-CM | POA: Insufficient documentation

## 2015-02-27 DIAGNOSIS — D680 Von Willebrand's disease: Secondary | ICD-10-CM

## 2015-02-27 HISTORY — DX: Family history of other specified conditions: Z84.89

## 2015-02-27 HISTORY — DX: Low back pain, unspecified: M54.50

## 2015-02-27 HISTORY — DX: Low back pain: M54.5

## 2015-02-27 HISTORY — DX: Headache, unspecified: R51.9

## 2015-02-27 HISTORY — PX: BREAST REDUCTION SURGERY: SHX8

## 2015-02-27 HISTORY — DX: Other chronic pain: G89.29

## 2015-02-27 HISTORY — DX: Headache: R51

## 2015-02-27 HISTORY — PX: REDUCTION MAMMAPLASTY: SUR839

## 2015-02-27 SURGERY — MAMMOPLASTY, REDUCTION
Anesthesia: General | Site: Breast | Laterality: Bilateral

## 2015-02-27 MED ORDER — OXYCODONE HCL 5 MG/5ML PO SOLN: 5.0000 mg | Freq: Once | ORAL | Status: DC | PRN

## 2015-02-27 MED ORDER — AMPHETAMINE-DEXTROAMPHETAMINE 10 MG PO TABS: 20.0000 mg | ORAL_TABLET | Freq: Every evening | ORAL | Status: DC

## 2015-02-27 MED ORDER — HYDROMORPHONE HCL 1 MG/ML IJ SOLN
INTRAMUSCULAR | Status: AC
  Administered 2015-02-27: 0.5 mg via INTRAVENOUS
  Filled 2015-02-27: qty 1

## 2015-02-27 MED ORDER — SODIUM CHLORIDE 0.9 % IV SOLN
0.3000 ug/kg | INTRAVENOUS | Status: AC
  Administered 2015-02-27: 34.4 ug via INTRAVENOUS
  Filled 2015-02-27: qty 8.6

## 2015-02-27 MED ORDER — AMPHETAMINE-DEXTROAMPHET ER 10 MG PO CP24
20.0000 mg | ORAL_CAPSULE | Freq: Every day | ORAL | Status: DC
  Administered 2015-02-28: 20 mg via ORAL
  Filled 2015-02-27: qty 2

## 2015-02-27 MED ORDER — FENTANYL CITRATE (PF) 100 MCG/2ML IJ SOLN
INTRAMUSCULAR | Status: DC | PRN
  Administered 2015-02-27 (×3): 50 ug via INTRAVENOUS
  Administered 2015-02-27: 100 ug via INTRAVENOUS
  Administered 2015-02-27: 50 ug via INTRAVENOUS
  Administered 2015-02-27: 150 ug via INTRAVENOUS
  Administered 2015-02-27: 50 ug via INTRAVENOUS

## 2015-02-27 MED ORDER — OXYCODONE HCL 5 MG PO TABS: 5.0000 mg | ORAL_TABLET | Freq: Once | ORAL | Status: DC | PRN

## 2015-02-27 MED ORDER — ACETAMINOPHEN 500 MG PO TABS: 500.0000 mg | ORAL_TABLET | Freq: Three times a day (TID) | ORAL | Status: DC | PRN

## 2015-02-27 MED ORDER — PROMETHAZINE HCL 25 MG/ML IJ SOLN
INTRAMUSCULAR | Status: AC
  Filled 2015-02-27: qty 1

## 2015-02-27 MED ORDER — PHENYLEPHRINE HCL 10 MG/ML IJ SOLN
10.0000 mg | INTRAMUSCULAR | Status: DC | PRN
  Administered 2015-02-27: 40 ug/min via INTRAVENOUS

## 2015-02-27 MED ORDER — LACTATED RINGERS IV SOLN
Freq: Once | INTRAVENOUS | Status: AC
  Administered 2015-02-27: 07:00:00 via INTRAVENOUS

## 2015-02-27 MED ORDER — METHOCARBAMOL 500 MG PO TABS: 500.0000 mg | ORAL_TABLET | Freq: Four times a day (QID) | ORAL | Status: DC | PRN

## 2015-02-27 MED ORDER — CEFAZOLIN SODIUM 1-5 GM-% IV SOLN
1.0000 g | Freq: Four times a day (QID) | INTRAVENOUS | Status: DC
  Administered 2015-02-27 – 2015-02-28 (×4): 1 g via INTRAVENOUS
  Filled 2015-02-27 (×6): qty 50

## 2015-02-27 MED ORDER — ROCURONIUM BROMIDE 50 MG/5ML IV SOLN
INTRAVENOUS | Status: AC
  Filled 2015-02-27: qty 1

## 2015-02-27 MED ORDER — HYDROMORPHONE HCL 2 MG PO TABS
2.0000 mg | ORAL_TABLET | ORAL | Status: DC | PRN
  Administered 2015-02-27 – 2015-02-28 (×4): 4 mg via ORAL
  Filled 2015-02-27 (×4): qty 2

## 2015-02-27 MED ORDER — SUCCINYLCHOLINE CHLORIDE 20 MG/ML IJ SOLN
INTRAMUSCULAR | Status: AC
  Filled 2015-02-27: qty 1

## 2015-02-27 MED ORDER — CEFAZOLIN SODIUM-DEXTROSE 2-3 GM-% IV SOLR
2.0000 g | INTRAVENOUS | Status: AC
  Administered 2015-02-27: 2 g via INTRAVENOUS
  Filled 2015-02-27: qty 50

## 2015-02-27 MED ORDER — PHENYLEPHRINE HCL 10 MG/ML IJ SOLN
INTRAMUSCULAR | Status: DC | PRN
  Administered 2015-02-27 (×4): 80 ug via INTRAVENOUS

## 2015-02-27 MED ORDER — ROCURONIUM BROMIDE 100 MG/10ML IV SOLN
INTRAVENOUS | Status: DC | PRN
  Administered 2015-02-27: 50 mg via INTRAVENOUS

## 2015-02-27 MED ORDER — PROMETHAZINE HCL 25 MG/ML IJ SOLN
6.2500 mg | INTRAMUSCULAR | Status: DC | PRN
Start: 2015-02-27 — End: 2015-02-27
  Administered 2015-02-27: 6.25 mg via INTRAVENOUS

## 2015-02-27 MED ORDER — FENTANYL CITRATE (PF) 250 MCG/5ML IJ SOLN
INTRAMUSCULAR | Status: AC
  Filled 2015-02-27: qty 5

## 2015-02-27 MED ORDER — PROPOFOL 10 MG/ML IV BOLUS
INTRAVENOUS | Status: DC | PRN
  Administered 2015-02-27: 200 mg via INTRAVENOUS

## 2015-02-27 MED ORDER — DOCUSATE SODIUM 100 MG PO CAPS
100.0000 mg | ORAL_CAPSULE | Freq: Every day | ORAL | Status: DC
  Administered 2015-02-28: 100 mg via ORAL
  Filled 2015-02-27: qty 1

## 2015-02-27 MED ORDER — EPHEDRINE SULFATE 50 MG/ML IJ SOLN
INTRAMUSCULAR | Status: AC
  Filled 2015-02-27: qty 1

## 2015-02-27 MED ORDER — HYDROMORPHONE HCL 1 MG/ML IJ SOLN
0.2500 mg | INTRAMUSCULAR | Status: DC | PRN
  Administered 2015-02-27 (×4): 0.5 mg via INTRAVENOUS

## 2015-02-27 MED ORDER — PROPOFOL 10 MG/ML IV BOLUS
INTRAVENOUS | Status: AC
  Filled 2015-02-27: qty 20

## 2015-02-27 MED ORDER — LACTATED RINGERS IV SOLN
INTRAVENOUS | Status: DC | PRN
  Administered 2015-02-27 (×2): via INTRAVENOUS

## 2015-02-27 MED ORDER — DEXAMETHASONE SODIUM PHOSPHATE 4 MG/ML IJ SOLN
INTRAMUSCULAR | Status: DC | PRN
  Administered 2015-02-27: 10 mg via INTRAVENOUS

## 2015-02-27 MED ORDER — LOTEPREDNOL ETABONATE 0.5 % OP SUSP
1.0000 [drp] | Freq: Every day | OPHTHALMIC | Status: DC
  Filled 2015-02-27: qty 5

## 2015-02-27 MED ORDER — DEXTROSE-NACL 5-0.45 % IV SOLN
INTRAVENOUS | Status: DC
  Administered 2015-02-27: 14:00:00 via INTRAVENOUS

## 2015-02-27 MED ORDER — MIDAZOLAM HCL 5 MG/5ML IJ SOLN
INTRAMUSCULAR | Status: DC | PRN
  Administered 2015-02-27: 2 mg via INTRAVENOUS

## 2015-02-27 MED ORDER — CEFAZOLIN SODIUM 1-5 GM-% IV SOLN
INTRAVENOUS | Status: AC
  Filled 2015-02-27: qty 50

## 2015-02-27 MED ORDER — LIDOCAINE HCL (CARDIAC) 20 MG/ML IV SOLN
INTRAVENOUS | Status: DC | PRN
  Administered 2015-02-27: 60 mg via INTRAVENOUS

## 2015-02-27 MED ORDER — ONDANSETRON HCL 4 MG/2ML IJ SOLN
INTRAMUSCULAR | Status: DC | PRN
  Administered 2015-02-27: 4 mg via INTRAVENOUS

## 2015-02-27 MED ORDER — SUCCINYLCHOLINE CHLORIDE 20 MG/ML IJ SOLN
INTRAMUSCULAR | Status: DC | PRN
  Administered 2015-02-27: 120 mg via INTRAVENOUS

## 2015-02-27 MED ORDER — MIDAZOLAM HCL 2 MG/2ML IJ SOLN
INTRAMUSCULAR | Status: AC
  Filled 2015-02-27: qty 4

## 2015-02-27 MED ORDER — ALPRAZOLAM 0.5 MG PO TABS: 1.0000 mg | ORAL_TABLET | Freq: Two times a day (BID) | ORAL | Status: DC | PRN

## 2015-02-27 MED ORDER — DIPHENHYDRAMINE HCL 25 MG PO CAPS
25.0000 mg | ORAL_CAPSULE | Freq: Four times a day (QID) | ORAL | Status: DC | PRN
  Administered 2015-02-27: 25 mg via ORAL
  Filled 2015-02-27: qty 1

## 2015-02-27 MED ORDER — SODIUM CHLORIDE 0.9 % IV SOLN
0.3000 ug/kg | Freq: Once | INTRAVENOUS | Status: AC
  Administered 2015-02-28: 34.4 ug via INTRAVENOUS
  Filled 2015-02-27: qty 8.6

## 2015-02-27 MED ORDER — STERILE WATER FOR INJECTION IJ SOLN
INTRAMUSCULAR | Status: AC
  Filled 2015-02-27: qty 10

## 2015-02-27 MED ORDER — PROMETHAZINE HCL 25 MG/ML IJ SOLN
6.2500 mg | INTRAMUSCULAR | Status: DC | PRN
  Administered 2015-02-27 (×2): 6.25 mg via INTRAVENOUS
  Filled 2015-02-27 (×2): qty 1

## 2015-02-27 MED ORDER — SUGAMMADEX SODIUM 200 MG/2ML IV SOLN
INTRAVENOUS | Status: DC | PRN
  Administered 2015-02-27: 300 mg via INTRAVENOUS

## 2015-02-27 MED ORDER — 0.9 % SODIUM CHLORIDE (POUR BTL) OPTIME
TOPICAL | Status: DC | PRN
  Administered 2015-02-27 (×2): 1000 mL

## 2015-02-27 MED ORDER — SUGAMMADEX SODIUM 500 MG/5ML IV SOLN
INTRAVENOUS | Status: AC
  Filled 2015-02-27: qty 5

## 2015-02-27 SURGICAL SUPPLY — 59 items
ADH SKN CLS APL DERMABOND .7 (GAUZE/BANDAGES/DRESSINGS) ×3
ATCH SMKEVC FLXB CAUT HNDSWH (FILTER) ×1 IMPLANT
BALL CTTN LRG ABS STRL LF (GAUZE/BANDAGES/DRESSINGS) ×1
BANDAGE ELASTIC 6 VELCRO ST LF (GAUZE/BANDAGES/DRESSINGS) ×2 IMPLANT
BINDER BREAST XXLRG (GAUZE/BANDAGES/DRESSINGS) ×2 IMPLANT
BLADE 10 SAFETY STRL DISP (BLADE) ×3 IMPLANT
CANISTER SUCTION 2500CC (MISCELLANEOUS) ×3 IMPLANT
CHLORAPREP W/TINT 26ML (MISCELLANEOUS) ×3 IMPLANT
CLIP TI MEDIUM 6 (CLIP) ×2 IMPLANT
CLIP TI WIDE RED SMALL 6 (CLIP) ×2 IMPLANT
CLOSURE WOUND 1/2 X4 (GAUZE/BANDAGES/DRESSINGS)
COTTONBALL LRG STERILE PKG (GAUZE/BANDAGES/DRESSINGS) ×2 IMPLANT
COVER SURGICAL LIGHT HANDLE (MISCELLANEOUS) ×3 IMPLANT
DERMABOND ADVANCED (GAUZE/BANDAGES/DRESSINGS) ×6
DERMABOND ADVANCED .7 DNX12 (GAUZE/BANDAGES/DRESSINGS) ×1 IMPLANT
DRAPE ORTHO SPLIT 77X108 STRL (DRAPES) ×6
DRAPE PROXIMA HALF (DRAPES) ×6 IMPLANT
DRAPE SURG ORHT 6 SPLT 77X108 (DRAPES) ×2 IMPLANT
DRAPE WARM FLUID 44X44 (DRAPE) ×3 IMPLANT
DRSG PAD ABDOMINAL 8X10 ST (GAUZE/BANDAGES/DRESSINGS) ×10 IMPLANT
ELECT CAUTERY BLADE 6.4 (BLADE) ×3 IMPLANT
ELECT REM PT RETURN 9FT ADLT (ELECTROSURGICAL) ×3
ELECTRODE REM PT RTRN 9FT ADLT (ELECTROSURGICAL) ×1 IMPLANT
EVACUATOR SMOKE ACCUVAC VALLEY (FILTER) ×2
GAUZE SPONGE 4X4 12PLY STRL (GAUZE/BANDAGES/DRESSINGS) IMPLANT
GAUZE XEROFORM 5X9 LF (GAUZE/BANDAGES/DRESSINGS) ×4 IMPLANT
GLOVE BIO SURGEON STRL SZ7.5 (GLOVE) ×5 IMPLANT
GLOVE BIOGEL PI IND STRL 6.5 (GLOVE) IMPLANT
GLOVE BIOGEL PI IND STRL 7.5 (GLOVE) IMPLANT
GLOVE BIOGEL PI IND STRL 8 (GLOVE) ×1 IMPLANT
GLOVE BIOGEL PI INDICATOR 6.5 (GLOVE) ×6
GLOVE BIOGEL PI INDICATOR 7.5 (GLOVE) ×4
GLOVE BIOGEL PI INDICATOR 8 (GLOVE) ×2
GLOVE SS BIOGEL STRL SZ 6.5 (GLOVE) IMPLANT
GLOVE SUPERSENSE BIOGEL SZ 6.5 (GLOVE) ×2
GLOVE SURG SS PI 6.5 STRL IVOR (GLOVE) ×4 IMPLANT
GOWN STRL REUS W/ TWL LRG LVL3 (GOWN DISPOSABLE) ×1 IMPLANT
GOWN STRL REUS W/ TWL XL LVL3 (GOWN DISPOSABLE) ×1 IMPLANT
GOWN STRL REUS W/TWL LRG LVL3 (GOWN DISPOSABLE) ×12
GOWN STRL REUS W/TWL XL LVL3 (GOWN DISPOSABLE) ×3
KIT BASIN OR (CUSTOM PROCEDURE TRAY) ×3 IMPLANT
KIT ROOM TURNOVER OR (KITS) ×3 IMPLANT
MARKER SKIN DUAL TIP RULER LAB (MISCELLANEOUS) ×3 IMPLANT
NS IRRIG 1000ML POUR BTL (IV SOLUTION) ×6 IMPLANT
PACK GENERAL/GYN (CUSTOM PROCEDURE TRAY) ×3 IMPLANT
PAD ARMBOARD 7.5X6 YLW CONV (MISCELLANEOUS) ×3 IMPLANT
PREFILTER EVAC NS 1 1/3-3/8IN (MISCELLANEOUS) ×3 IMPLANT
SPONGE LAP 18X18 X RAY DECT (DISPOSABLE) ×4 IMPLANT
STRIP CLOSURE SKIN 1/2X4 (GAUZE/BANDAGES/DRESSINGS) IMPLANT
SUT MNCRL AB 3-0 PS2 18 (SUTURE) ×13 IMPLANT
SUT MNCRL AB 4-0 PS2 18 (SUTURE) ×6 IMPLANT
SUT MON AB 2-0 CT1 36 (SUTURE) ×9 IMPLANT
SUT PROLENE 4 0 PS 2 18 (SUTURE) ×2 IMPLANT
SUT PROLENE 5 0 PS 2 (SUTURE) ×6 IMPLANT
SUT SILK 4 0 P 3 (SUTURE) ×6 IMPLANT
TOWEL OR 17X24 6PK STRL BLUE (TOWEL DISPOSABLE) ×5 IMPLANT
TOWEL OR 17X26 10 PK STRL BLUE (TOWEL DISPOSABLE) ×3 IMPLANT
TUBE CONNECTING 12'X1/4 (SUCTIONS) ×1
TUBE CONNECTING 12X1/4 (SUCTIONS) ×2 IMPLANT

## 2015-02-27 NOTE — Brief Op Note (Signed)
02/27/2015  11:37 AM  PATIENT:  Ann [name]  34 y.o. female  PRE-OPERATIVE DIAGNOSIS:  BREAST HYPERTROPHY  POST-OPERATIVE DIAGNOSIS:  BREAST HYPERTROPHY  PROCEDURE:  Procedure(s): BILATERAL BREAST REDUCTION WITH FREE NIPPLE GRAFT TECHNIQUE ON RIGHT BREAST (Bilateral)  SURGEON:  Surgeon(s) and Role:    * Etter Sjogren, MD - Primary  PHYSICIAN ASSISTANT:   ASSISTANTS: Natasha Bence, RNFA   ANESTHESIA:   general  EBL:  Total I/O In: 1000 [I.V.:1000] Out: 200 [Blood:200]  BLOOD ADMINISTERED:none  DRAINS: none   LOCAL MEDICATIONS USED:  NONE  SPECIMEN:  Source of Specimen:  Bilateral breast  DISPOSITION OF SPECIMEN:  PATHOLOGY  COUNTS:  YES  TOURNIQUET:  * No tourniquets in log *  DICTATION: .Other Dictation: Dictation Number [id number]  PLAN OF CARE: Admit for overnight observation  PATIENT DISPOSITION:  PACU - hemodynamically stable.   Delay start of Pharmacological VTE agent (>24hrs) due to surgical blood loss or risk of bleeding: yes (Patient has mild hemophilia)

## 2015-02-27 NOTE — Anesthesia Procedure Notes (Signed)
Procedure Name: Intubation Date/Time: 02/27/2015 7:51 AM Performed by: Reine Just Pre-anesthesia Checklist: Timeout performed, Patient identified, Emergency Drugs available, Suction available and Patient being monitored Patient Re-evaluated:Patient Re-evaluated prior to inductionOxygen Delivery Method: Circle system utilized and Simple face mask Preoxygenation: Pre-oxygenation with 100% oxygen Intubation Type: IV induction Ventilation: Mask ventilation without difficulty Laryngoscope Size: Miller and 2 Grade View: Grade I Tube type: Oral Tube size: 7.5 mm Number of attempts: 1 Airway Equipment and Method: Patient positioned with wedge pillow and Stylet Placement Confirmation: ETT inserted through vocal cords under direct vision,  positive ETCO2 and breath sounds checked- equal and bilateral Secured at: 21 cm Tube secured with: Tape Dental Injury: Teeth and Oropharynx as per pre-operative assessment

## 2015-02-27 NOTE — Anesthesia Preprocedure Evaluation (Signed)
Anesthesia Evaluation  Patient identified by MRN, date of birth, ID band Patient awake    Reviewed: Allergy & Precautions, NPO status , Patient's Chart, lab work & pertinent test results  Airway Mallampati: II  TM Distance: >3 FB Neck ROM: Full    Dental  (+) Teeth Intact, Dental Advisory Given   Pulmonary asthma , former smoker,    breath sounds clear to auscultation       Cardiovascular negative cardio ROS   Rhythm:Regular Rate:Normal     Neuro/Psych Anxiety Depression negative neurological ROS     GI/Hepatic negative GI ROS, Neg liver ROS,   Endo/Other  Morbid obesity  Renal/GU negative Renal ROS     Musculoskeletal negative musculoskeletal ROS (+)   Abdominal   Peds  Hematology  (+) Blood dyscrasia, , Von Willebrand's dz   Anesthesia Other Findings   Reproductive/Obstetrics                             Anesthesia Physical Anesthesia Plan  ASA: III  Anesthesia Plan: General   Post-op Pain Management:    Induction: Intravenous  Airway Management Planned: Oral ETT  Additional Equipment:   Intra-op Plan:   Post-operative Plan: Extubation in OR  Informed Consent: I have reviewed the patients History and Physical, chart, labs and discussed the procedure including the risks, benefits and alternatives for the proposed anesthesia with the patient or authorized representative who has indicated his/her understanding and acceptance.   Dental advisory given  Plan Discussed with: CRNA  Anesthesia Plan Comments:         Anesthesia Quick Evaluation

## 2015-02-27 NOTE — H&P (Signed)
I have re-examined and re-evaluated the patient and there are no changes.  See office notes in paper chart for H&P. 

## 2015-02-27 NOTE — Transfer of Care (Signed)
Immediate Anesthesia Transfer of Care Note  Patient: Ann [name]  Procedure(s) Performed: Procedure(s): BILATERAL BREAST REDUCTION WITH FREE NIPPLE GRAFT TECHNIQUE ON RIGHT BREAST (Bilateral)  Patient Location: PACU  Anesthesia Type:General  Level of Consciousness: awake, alert  and oriented  Airway & Oxygen Therapy: Patient Spontanous Breathing and Patient connected to face mask oxygen  Post-op Assessment: Report given to RN and Post -op Vital signs reviewed and stable  Post vital signs: Reviewed and stable  Last Vitals:  Filed Vitals:   02/27/15 0626  BP: 130/92  Pulse: 94  Temp: 37 C  Resp: 18    Complications: No apparent anesthesia complications

## 2015-02-27 NOTE — Progress Notes (Signed)
Report given to [facility] as caregiver,

## 2015-02-27 NOTE — Anesthesia Postprocedure Evaluation (Signed)
  Anesthesia Post-op Note  Patient: Ann [name]  Procedure(s) Performed: Procedure(s): BILATERAL BREAST REDUCTION WITH FREE NIPPLE GRAFT TECHNIQUE ON RIGHT BREAST (Bilateral)  Patient Location: PACU  Anesthesia Type:General  Level of Consciousness: awake and alert   Airway and Oxygen Therapy: Patient Spontanous Breathing  Post-op Pain: mild  Post-op Assessment: Post-op Vital signs reviewed              Post-op Vital Signs: Reviewed  Last Vitals:  Filed Vitals:   02/27/15 1315  BP: 125/77  Pulse: 85  Temp: 36.7 C  Resp: 15    Complications: No apparent anesthesia complications

## 2015-02-27 NOTE — Op Note (Signed)
[name], [name] NO.:  [id number]  MEDICAL RECORD NO.:  [id number]  LOCATION:  [facility]                         FACILITY:  [facility]  PHYSICIAN:  Etter Sjogren, M.D.     DATE OF BIRTH:  07-Apr-1981  DATE OF PROCEDURE:  02/27/2015 DATE OF DISCHARGE:                              OPERATIVE REPORT   PREOPERATIVE DIAGNOSIS:  Bilateral macromastia.  POSTOPERATIVE DIAGNOSIS:  Bilateral macromastia.  PROCEDURE PERFORMED:  Bilateral breast reduction, left side inferior pedicle technique; right side free nipple graft technique.  SURGEON:  Etter Sjogren, M.D.  ASSISTANT:  Ann [name], RNFA.  ANESTHESIA:  General.  ESTIMATED BLOOD LOSS:  75 mL.  CLINICAL NOTE:  This 34 year old woman complains of large left breast with significant asymmetry as well as upper back pain, neck pain, shoulder pain, and bra strap shoulder grooving.  She desired a bilateral breast reduction.  The procedure was discussed with her in detail.  She had a very large right breast much larger than the left and the nipple notch distance on the right side 43 cm, left side was around 37 cm.  Due to this nipple notch distance on the right, it was felt that a free nipple graft technique was indicated.  This was discussed with her at length and it was recommended that she have an inferior pedicle technique on the left, which would have little bit better chance of preserving a good sensation to the nipple and also preserve color a little bit better.  She understood that a free nipple graft technique had the risk of certainly losing sensation in the right nipple with hopefully the return of some of that sensation over a period of time of couple of years, but also had a significant chance of hypopigmentation. She understood that she would not be able to breast feed.  Other risks were discussed with her that included, but not limited to, bleeding, infection, healing problems, scarring, loss of sensation,  loss of sensation of the nipples, fluid accumulations, failure of the nipple graft, loss of nipple, loss of skin, loss of fatty tissue, asymmetry, chronic pain, failure to relieve symptoms, and overall disappointment. She understood all of this and wished to proceed.  DESCRIPTION OF PROCEDURE:  The patient was marked in the holding area for bilateral breast reduction.  She was in the operating room, placed supine.  After successful induction of general anesthesia, she was prepped with ChloraPrep and after waiting a full 3 minutes for drying, she was draped with sterile drapes.  On the left side, an 8 cm wide inferior nipple-areolar pedicle was designed and on the right side, the markings were placed such that there would be projection centrally with the central deepithelialized aspect.  On the left side, 42 mm marker marked the nipple-areolar complex as it did on the right side.  Left side inferior pedicle was then de-epithelialized after all incisions were have been made and the pedicle was isolated from surrounding tissues using electrocautery, beveling outward both medial and lateral in order to ensure a much broader attachment at the level of chest wall than at the level of skin.  During dissection, nipple complex was inspected and found to have good color and bright red bleeding  around its periphery consistent with viability.  On the right side, the nipple areolar grafts were simply harvested and placed in a saline soaked lap. On the left side, the resections were performed medial, central, and lateral, and a total of 393 g resected and on the right side, it was essentially a lower pole amputation of the breast 926 g.  This seemed to give her very good symmetry in terms of volume as well as position. Thorough irrigation with saline.  Meticulous hemostasis was achieved with electrocautery.  Excellent hemostasis having been achieved via closures with 2-0 and 3-0 Monocryl interrupted  inverted deep sutures followed by 3-0 Monocryl running subcuticular suture.  On the left side, a measurement was taken 5 cm up from the inframammary crease and the 42 mm marker marked sites of nipple-areolar complex.  This opening was created and then the nipple-areolar complex brought through this opening.  It was again inspected and found to have excellent color and bright red bleeding around its periphery consistent with viability. Irrigation with saline.  Excellent hemostasis was noted and the nipple- areolar complex was inset using 3-0 Monocryl interrupted inverted deep dermal sutures and running 4-0 Monocryl subcuticular suture.  On the right side, the measurement was taken 5 cm up from the inframammary crease and the 42 mm marker marked the site for the nipple-areolar complex graft and the tissue was then de-epithelialized.  There was excellent bleeding at the base.  The nipple areolar graft was then defatted meticulously and then a 4-0 silk simple interrupted sutures around the periphery with 1 tail left long for the tie-over bolster dressing followed by running 5-0 Prolene simple suture.  Saline was then used to flush underneath the graft to be certain that there was no blood underneath.  Xeroform gauze, 2 cotton balls soaked in saline were then placed, and 4-0 silk sutures were then tied over this as a bolster dressing.  Dermabond was used to seal the wound, dry sterile dressings, circumferential Ace wrap, and the breast binder, and she was transferred to the recovery room.  DISPOSITION:  She will be observed overnight in the RCC.     Etter Sjogren, M.D.     DB/MEDQ  D:  02/27/2015  T:  02/27/2015  Job:  [id number]  cc:   Etter Sjogren, M.D.

## 2015-02-28 ENCOUNTER — Encounter (HOSPITAL_COMMUNITY): Payer: Self-pay | Admitting: Plastic Surgery

## 2015-02-28 DIAGNOSIS — N62 Hypertrophy of breast: Secondary | ICD-10-CM | POA: Diagnosis not present

## 2015-02-28 LAB — BASIC METABOLIC PANEL
ANION GAP: 8 (ref 5–15)
BUN: 6 mg/dL (ref 6–20)
CHLORIDE: 100 mmol/L — AB (ref 101–111)
CO2: 26 mmol/L (ref 22–32)
Calcium: 8.8 mg/dL — ABNORMAL LOW (ref 8.9–10.3)
Creatinine, Ser: 0.86 mg/dL (ref 0.44–1.00)
GFR calc Af Amer: >60 mL/min (ref >60)
GFR calc non Af Amer: >60 mL/min (ref >60)
Glucose, Bld: 163 mg/dL — ABNORMAL HIGH (ref 65–99)
POTASSIUM: 3.3 mmol/L — AB (ref 3.5–5.1)
SODIUM: 134 mmol/L — AB (ref 135–145)

## 2015-02-28 MED ORDER — METHOCARBAMOL 500 MG PO TABS: 500.0000 mg | ORAL_TABLET | Freq: Four times a day (QID) | ORAL | Status: DC | PRN

## 2015-02-28 MED ORDER — HYDROMORPHONE HCL 2 MG PO TABS: 2.0000 mg | ORAL_TABLET | ORAL | Status: DC | PRN

## 2015-02-28 MED ORDER — DOCUSATE SODIUM 100 MG PO CAPS: 100.0000 mg | ORAL_CAPSULE | Freq: Every day | ORAL | Status: DC

## 2015-02-28 NOTE — Discharge Instructions (Addendum)
No lifting for 6 weeks No vigorous activity for 6 weeks (including outdoor walks) No driving for 4 weeks OK to walk up stairs slowly Stay propped up Use incentive spirometer at home every hour while awake No shower until after you see Dr. Odis Luster in office No exercising or raising arms overhead Take an over-the-counter stool softener (such as Colace) while on pain medication See Dr. Odis Luster in the office next week For questions call [phone] or [phone]

## 2015-02-28 NOTE — Discharge Summary (Signed)
Physician Discharge Summary  Patient ID: Ann [name] MRN: [id number] DOB/AGE: 11-Mar-1981 34 y.o.  Admit date: 02/27/2015 Discharge date: 02/28/2015  Admission Diagnoses:Macromastia. Hemophilia (mild).  Discharge Diagnoses: Same Active Problems:   Breast hypertrophy in female   Discharged Condition: good  Hospital Course: On the day of admission the patient was taken to surgery and had bilateral breast reduction. The patient tolerated the procedures well. Postoperatively, the left nipple complex maintained excellent color and capillary refill.The right is a free nipple graft. The patient was ambulatory and tolerating diet on the first postoperative day. She was given DDAVP pre-op and this morning and tolerated it well..  Treatments: antibiotics: Ancef, anticoagulation: none and surgery: bilateral breast reduction  Discharge Exam: Blood pressure 124/56, pulse 85, temperature 98.3 F (36.8 C), temperature source Oral, resp. rate 18, height  (1.6 m), weight 248 lb (112.492 kg), SpO2 100 %.  Operative sites: Chest soft bilateral. No evidence of bleeding or infection. Left nipple complex has good color and sensation is viable. Right side is nipple graft.  Disposition: 01-Home or Self Care     Medication List    STOP taking these medications        acetaminophen 500 MG tablet  Commonly known as:  TYLENOL      TAKE these medications        ALPRAZolam 1 MG tablet  Commonly known as:  XANAX  Take 1 mg by mouth 2 (two) times daily as needed for anxiety.     amphetamine-dextroamphetamine 20 MG 24 hr capsule  Commonly known as:  ADDERALL XR  Take 20 mg by mouth daily.     amphetamine-dextroamphetamine 20 MG tablet  Commonly known as:  ADDERALL  Take 20 mg by mouth every evening.     desmopressin 0.01 % Soln  Commonly known as:  DDAVP  Place 1 spray into the nose. 1 spray each nostril for 3-5 days after surgical procedure     docusate sodium 100 MG capsule   Commonly known as:  COLACE  Take 1 capsule (100 mg total) by mouth daily.     HYDROmorphone 2 MG tablet  Commonly known as:  DILAUDID  Take 1-2 tablets (2-4 mg total) by mouth every 4 (four) hours as needed for moderate pain or severe pain.     loteprednol 0.5 % ophthalmic suspension  Commonly known as:  LOTEMAX  Place 1 drop into the right eye daily.     methocarbamol 500 MG tablet  Commonly known as:  ROBAXIN  Take 1 tablet (500 mg total) by mouth every 6 (six) hours as needed for muscle spasms.     traZODone 50 MG tablet  Commonly known as:  DESYREL  Take 50-100 mg by mouth at bedtime as needed for sleep (ONLY TAKES ON WEEKENDS).         SignedOdis Luster, Jevon Shells M 02/28/2015, 8:41 AM

## 2015-02-28 NOTE — Progress Notes (Signed)
AVS given to pt. And family already has RX.  Dc'd at 1200 with all personal belongings to private car home, accompanied by Mother.

## 2015-03-24 ENCOUNTER — Ambulatory Visit: Payer: 59 | Admitting: Oncology

## 2015-03-24 ENCOUNTER — Encounter: Payer: Self-pay | Admitting: Oncology

## 2015-03-24 NOTE — Progress Notes (Unsigned)
Patient ID: Ann [name], female   DOB: 1981/04/11, 34 y.o.   MRN: [id number] 34 y/o woman with type I Von Willebrand's disorder. She recently consulted me for advice re reduction mammoplasty surgery. Recommendation on use of DDAVP given to her and her surgeon. She underwent surgery on 02/27/15. EBL only 75 ml. Surgeon reported "excellent hemostasis". She failed to report for scheduled follow up visit today. Last time she had surgery she didn't keep appointment for 10 years until another procedure was scheduled so I am not surprised.

## 2015-06-10 ENCOUNTER — Emergency Department (INDEPENDENT_AMBULATORY_CARE_PROVIDER_SITE_OTHER)
Admission: EM | Admit: 2015-06-10 | Discharge: 2015-06-10 | Disposition: A | Discharge status: Home / Self Care | Payer: 59 | Source: Home / Self Care | Attending: Family Medicine | Admitting: Family Medicine

## 2015-06-10 ENCOUNTER — Encounter (HOSPITAL_COMMUNITY): Payer: Self-pay | Admitting: Emergency Medicine

## 2015-06-10 DIAGNOSIS — S61210A Laceration without foreign body of right index finger without damage to nail, initial encounter: Secondary | ICD-10-CM

## 2015-06-10 DIAGNOSIS — Z23 Encounter for immunization: Secondary | ICD-10-CM

## 2015-06-10 MED ORDER — TETANUS-DIPHTH-ACELL PERTUSSIS 5-2.5-18.5 LF-MCG/0.5 IM SUSP
INTRAMUSCULAR | Status: AC
  Filled 2015-06-10: qty 0.5

## 2015-06-10 MED ORDER — LIDOCAINE HCL 2 % IJ SOLN
INTRAMUSCULAR | Status: AC
  Filled 2015-06-10: qty 20

## 2015-06-10 MED ORDER — TETANUS-DIPHTH-ACELL PERTUSSIS 5-2.5-18.5 LF-MCG/0.5 IM SUSP
0.5000 mL | Freq: Once | INTRAMUSCULAR | Status: AC
  Administered 2015-06-10: 0.5 mL via INTRAMUSCULAR

## 2015-06-10 NOTE — ED Provider Notes (Signed)
CSN: [id number]     Arrival date & time 06/10/15  1834 History   First MD Initiated Contact with Patient 06/10/15 1929     Chief Complaint  Patient presents with  . Laceration   (Consider location/radiation/quality/duration/timing/severity/associated sxs/prior Treatment) HPI Comments: 7 or old female accidentally cut her right index finger with the blade of a pencil sharpener at 4 PM this afternoon. This produced a 1.5 cm laceration to the distal phalanx of the right index finger flexor surface.   Past Medical History  Diagnosis Date  . Von Willebrand disease (HCC)   . Anemia   . PONV (postoperative nausea and vomiting)   . Asthma     mild case per pt  . Hemorrhoids   . History of esophagogastroduodenoscopy (EGD)   . Nocturia   . ADD (attention deficit disorder)     takes Adderall daily  . Family history of adverse reaction to anesthesia     "Mom gets PONV too"  . Pneumonia "several times"  . Blood dyscrasia     last seen when diagnosed with Von Willebrands by Dr Cyndie Chime 58yrs ago  . Migraine     "none in the last 6 months" (02/27/2015)  . Headache     "monthly" (02/27/2015)  . Chronic lower back pain   . Depression     has meds prescribed but doesn't take them  . Anxiety     takes Xanax daily as needed   Past Surgical History  Procedure Laterality Date  . Laparoscopy  2011  . Wisdom tooth extraction  2001  . Dilation and curettage of uterus  ~ 1997; ~ 2005  . Cesarean section  2004; 2013  . Multiple tooth extractions  "scattered dates"  . Ercp  03/03/2012    Procedure: ENDOSCOPIC RETROGRADE CHOLANGIOPANCREATOGRAPHY (ERCP);  Surgeon: Theda Belfast, MD;  Location: [facility] ENDOSCOPY;  Service: Endoscopy;  Laterality: N/A;  dl/hung  . Cholecystectomy  03/02/2012    Procedure: LAPAROSCOPIC CHOLECYSTECTOMY WITH INTRAOPERATIVE CHOLANGIOGRAM;  Surgeon: Currie Paris, MD;  Location: [facility] OR;  Service: General;  Laterality: N/A;  . Dilitation & currettage/hystroscopy with  thermachoice ablation  04/28/2012    Procedure: DILATATION & CURETTAGE/HYSTEROSCOPY WITH THERMACHOICE ABLATION;  Surgeon: Jeani Hawking, MD;  Location: [facility] ORS;  Service: Gynecology;  Laterality: N/A;  . Back surgery    . Tubal ligation  2013  . Reduction mammaplasty Bilateral 02/27/2015  . Lumbar disc surgery  2003; 2008; 2009  . Posterior lumbar fusion  2010  . Breast reduction surgery Bilateral 02/27/2015    Procedure: BILATERAL BREAST REDUCTION WITH FREE NIPPLE GRAFT TECHNIQUE ON RIGHT BREAST;  Surgeon: Etter Sjogren, MD;  Location: [facility] OR;  Service: Plastics;  Laterality: Bilateral;   Family History  Problem Relation Age of Onset  . Stroke Mother   . Arthritis Mother   . Depression Mother   . COPD Father   . Kidney disease Paternal Uncle   . Stroke Maternal Grandfather   . Heart disease Paternal Grandfather    Social History  Substance Use Topics  . Smoking status: Former Smoker -- 0.25 packs/day for 2 years    Types: Cigarettes  . Smokeless tobacco: Never Used     Comment: "quit smoking ~ 2000  . Alcohol Use: 0.0 oz/week    0 Standard drinks or equivalent per week     Comment: 02/27/2015 "might have a few drinks a couple times/yr"   OB History    Gravida Para Term Preterm AB TAB SAB Ectopic  Multiple Living   3 2 2  1 1    2      Review of Systems  Constitutional: Negative.   HENT: Negative.   Respiratory: Negative.   Skin: Positive for wound.       As per history of present illness  Neurological: Negative.   All other systems reviewed and are negative.   Allergies  Darvocet; Codeine; and Sulfa antibiotics  Home Medications   Prior to Admission medications   Medication Sig Start Date End Date Taking? Authorizing Provider  ALPRAZolam Prudy Feeler) 1 MG tablet Take 1 mg by mouth 2 (two) times daily as needed for anxiety.    Historical Provider, MD  amphetamine-dextroamphetamine (ADDERALL XR) 20 MG 24 hr capsule Take 20 mg by mouth daily.    Historical Provider, MD   amphetamine-dextroamphetamine (ADDERALL) 20 MG tablet Take 20 mg by mouth every evening.    Historical Provider, MD  desmopressin (DDAVP) 0.01 % SOLN Place 1 spray into the nose. 1 spray each nostril for 3-5 days after surgical procedure    Levert Feinstein, MD  docusate sodium (COLACE) 100 MG capsule Take 1 capsule (100 mg total) by mouth daily. 02/28/15   Etter Sjogren, MD  HYDROmorphone (DILAUDID) 2 MG tablet Take 1-2 tablets (2-4 mg total) by mouth every 4 (four) hours as needed for moderate pain or severe pain. 02/28/15   Etter Sjogren, MD  loteprednol (LOTEMAX) 0.5 % ophthalmic suspension Place 1 drop into the right eye daily.    Historical Provider, MD  methocarbamol (ROBAXIN) 500 MG tablet Take 1 tablet (500 mg total) by mouth every 6 (six) hours as needed for muscle spasms. 02/28/15   Etter Sjogren, MD  traZODone (DESYREL) 50 MG tablet Take 50-100 mg by mouth at bedtime as needed for sleep (ONLY TAKES ON WEEKENDS).     Historical Provider, MD   Meds Ordered and Administered this Visit   Medications  Tdap (BOOSTRIX) injection 0.5 mL (not administered)    BP 137/74 mmHg  Pulse 86  Temp(Src) 98.4 F (36.9 C) (Oral)  Resp 12  SpO2 100% No data found.   Physical Exam  Constitutional: She appears well-developed and well-nourished. No distress.  Pulmonary/Chest: Effort normal. No respiratory distress.  Neurological: She is alert. No cranial nerve deficit. She exhibits normal muscle tone.  Skin: Skin is warm and dry.  1.5 cm superficial laceration to the right index finger flexor medial surface. Distal sensory and motor is intact. Nail is fully intact. Capillary refill is brisk. No involvement of the DIP joint. Bleeding well controlled.  Psychiatric: She has a normal mood and affect.  Nursing note and vitals reviewed.   ED Course  .Marland KitchenLaceration Repair Date/Time: 06/10/2015 8:04 PM Performed by: Phineas Real, Nalea Salce Authorized by: Bradd Canary D Consent: Verbal consent obtained. Risks  and benefits: risks, benefits and alternatives were discussed Consent given by: patient Patient understanding: patient states understanding of the procedure being performed Patient identity confirmed: verbally with patient Body area: upper extremity Location details: right index finger Laceration length: 1.5 cm Foreign bodies: no foreign bodies Tendon involvement: none Nerve involvement: none Local anesthetic: lidocaine 2% without epinephrine Anesthetic total: 1 ml Preparation: Patient was prepped and draped in the usual sterile fashion. Irrigation solution: saline Irrigation method: jet lavage Amount of cleaning: standard Debridement: minimal Degree of undermining: none Skin closure: 4-0 nylon Number of sutures: 2 Technique: simple Approximation: close Approximation difficulty: simple Patient tolerance: Patient tolerated the procedure well with no immediate complications   (including critical  care time)  Labs Review Labs Reviewed - No data to display  Imaging Review No results found.   Visual Acuity Review  Right Eye Distance:   Left Eye Distance:   Bilateral Distance:    Right Eye Near:   Left Eye Near:    Bilateral Near:         MDM   1. Laceration of second finger of right hand, initial encounter    Lac repair as above SR 11 days reeturn for problems Tdap 0.5cc IM Watch for infection    Hayden Rasmussen, NP 06/10/15 2009  Hayden Rasmussen, NP 06/10/15 2009

## 2015-06-10 NOTE — ED Notes (Signed)
Laceration to right index finger.  Patient was attempting to get sticker out of a pencil sharpener, cut tip of finger

## 2015-06-10 NOTE — Discharge Instructions (Signed)
Laceration Care, Adult °A laceration is a cut that goes through all of the layers of the skin and into the tissue that is right under the skin. Some lacerations heal on their own. Others need to be closed with stitches (sutures), staples, skin adhesive strips, or skin glue. Proper laceration care minimizes the risk of infection and helps the laceration to heal better. °HOW TO CARE FOR YOUR LACERATION °If sutures or staples were used: °· Keep the wound clean and dry. °· If you were given a bandage (dressing), you should change it at least one time per day or as told by your health care provider. You should also change it if it becomes wet or dirty. °· Keep the wound completely dry for the first 24 hours or as told by your health care provider. After that time, you may shower or bathe. However, make sure that the wound is not soaked in water until after the sutures or staples have been removed. °· Clean the wound one time each day or as told by your health care provider: °· Wash the wound with soap and water. °· Rinse the wound with water to remove all soap. °· Pat the wound dry with a clean towel. Do not rub the wound. °· After cleaning the wound, apply a thin layer of antibiotic ointment as told by your health care provider. This will help to prevent infection and keep the dressing from sticking to the wound. °· Have the sutures or staples removed as told by your health care provider. °If skin adhesive strips were used: °· Keep the wound clean and dry. °· If you were given a bandage (dressing), you should change it at least one time per day or as told by your health care provider. You should also change it if it becomes dirty or wet. °· Do not get the skin adhesive strips wet. You may shower or bathe, but be careful to keep the wound dry. °· If the wound gets wet, pat it dry with a clean towel. Do not rub the wound. °· Skin adhesive strips fall off on their own. You may trim the strips as the wound heals. Do not  remove skin adhesive strips that are still stuck to the wound. They will fall off in time. °If skin glue was used: °· Try to keep the wound dry, but you may briefly wet it in the shower or bath. Do not soak the wound in water, such as by swimming. °· After you have showered or bathed, gently pat the wound dry with a clean towel. Do not rub the wound. °· Do not do any activities that will make you sweat heavily until the skin glue has fallen off on its own. °· Do not apply liquid, cream, or ointment medicine to the wound while the skin glue is in place. Using those may loosen the film before the wound has healed. °· If you were given a bandage (dressing), you should change it at least one time per day or as told by your health care provider. You should also change it if it becomes dirty or wet. °· If a dressing is placed over the wound, be careful not to apply tape directly over the skin glue. Doing that may cause the glue to be pulled off before the wound has healed. °· Do not pick at the glue. The skin glue usually remains in place for 5-10 days, then it falls off of the skin. °General Instructions °· Take over-the-counter and prescription   medicines only as told by your health care provider. °· If you were prescribed an antibiotic medicine or ointment, take or apply it as told by your doctor. Do not stop using it even if your condition improves. °· To help prevent scarring, make sure to cover your wound with sunscreen whenever you are outside after stitches are removed, after adhesive strips are removed, or when glue remains in place and the wound is healed. Make sure to wear a sunscreen of at least 30 SPF. °· Do not scratch or pick at the wound. °· Keep all follow-up visits as told by your health care provider. This is important. °· Check your wound every day for signs of infection. Watch for: °· Redness, swelling, or pain. °· Fluid, blood, or pus. °· Raise (elevate) the injured area above the level of your heart  while you are sitting or lying down, if possible. °SEEK MEDICAL CARE IF: °· You received a tetanus shot and you have swelling, severe pain, redness, or bleeding at the injection site. °· You have a fever. °· A wound that was closed breaks open. °· You notice a bad smell coming from your wound or your dressing. °· You notice something coming out of the wound, such as wood or glass. °· Your pain is not controlled with medicine. °· You have increased redness, swelling, or pain at the site of your wound. °· You have fluid, blood, or pus coming from your wound. °· You notice a change in the color of your skin near your wound. °· You need to change the dressing frequently due to fluid, blood, or pus draining from the wound. °· You develop a new rash. °· You develop numbness around the wound. °SEEK IMMEDIATE MEDICAL CARE IF: °· You develop severe swelling around the wound. °· Your pain suddenly increases and is severe. °· You develop painful lumps near the wound or on skin that is anywhere on your body. °· You have a red streak going away from your wound. °· The wound is on your hand or foot and you cannot properly move a finger or toe. °· The wound is on your hand or foot and you notice that your fingers or toes look pale or bluish. °  °This information is not intended to replace advice given to you by your health care provider. Make sure you discuss any questions you have with your health care provider. °  °Document Released: 04/26/2005 Document Revised: 09/10/2014 Document Reviewed: 04/22/2014 °Elsevier Interactive Patient Education ©2016 [employer] ° °Stitches, Staples, or Adhesive Wound Closure °Health care providers use stitches (sutures), staples, and certain glue (skin adhesives) to hold skin together while it heals (wound closure). You may need this treatment after you have surgery or if you cut your skin accidentally. These methods help your skin to heal more quickly and make it less likely that you will have  a scar. A wound may take several months to heal completely. °The type of wound you have determines when your wound gets closed. In most cases, the wound is closed as soon as possible (primary skin closure). Sometimes, closure is delayed so the wound can be cleaned and allowed to heal naturally. This reduces the chance of infection. Delayed closure may be needed if your wound: °· Is caused by a bite. °· Happened more than 6 hours ago. °· Involves loss of skin or the tissues under the skin. °· Has dirt or debris in it that cannot be removed. °· Is infected. °WHAT   ARE THE DIFFERENT KINDS OF WOUND CLOSURES? °There are many options for wound closure. The one that your health care provider uses depends on how deep and how large your wound is. °Adhesive Glue °To use this type of glue to close a wound, your health care provider holds the edges of the wound together and paints the glue on the surface of your skin. You may need more than one layer of glue. Then the wound may be covered with a light bandage (dressing). °This type of skin closure may be used for small wounds that are not deep (superficial). Using glue for wound closure is less painful than other methods. It does not require a medicine that numbs the area (local anesthetic). This method also leaves nothing to be removed. Adhesive glue is often used for children and on facial wounds. °Adhesive glue cannot be used for wounds that are deep, uneven, or bleeding. It is not used inside of a wound.  °Adhesive Strips °These strips are made of sticky (adhesive), porous paper. They are applied across your skin edges like a regular adhesive bandage. You leave them on until they fall off. °Adhesive strips may be used to close very superficial wounds. They may also be used along with sutures to improve the closure of your skin edges.  °Sutures °Sutures are the oldest method of wound closure. Sutures can be made from natural substances, such as silk, or from synthetic  materials, such as nylon and steel. They can be made from a material that your body can break down as your wound heals (absorbable), or they can be made from a material that needs to be removed from your skin (nonabsorbable). They come in many different strengths and sizes. °Your health care provider attaches the sutures to a steel needle on one end. Sutures can be passed through your skin, or through the tissues beneath your skin. Then they are tied and cut. Your skin edges may be closed in one continuous stitch or in separate stitches. °Sutures are strong and can be used for all kinds of wounds. Absorbable sutures may be used to close tissues under the skin. The disadvantage of sutures is that they may cause skin reactions that lead to infection. Nonabsorbable sutures need to be removed. °Staples °When surgical staples are used to close a wound, the edges of your skin on both sides of the wound are brought close together. A staple is placed across the wound, and an instrument secures the edges together. Staples are often used to close surgical cuts (incisions). °Staples are faster to use than sutures, and they cause less skin reaction. Staples need to be removed using a tool that bends the staples away from your skin. °HOW DO I CARE FOR MY WOUND CLOSURE? °· Take medicines only as directed by your health care provider. °· If you were prescribed an antibiotic medicine for your wound, finish it all even if you start to feel better. °· Use ointments or creams only as directed by your health care provider. °· Wash your hands with soap and water before and after touching your wound. °· Do not soak your wound in water. Do not take baths, swim, or use a hot tub until your health care provider approves. °· Ask your health care provider when you can start showering. Cover your wound if directed by your health care provider. °· Do not take out your own sutures or staples. °· Do not pick at your wound. Picking can cause an  infection. °·   Keep all follow-up visits as directed by your health care provider. This is important. °HOW LONG WILL I HAVE MY WOUND CLOSURE? °· Leave adhesive glue on your skin until the glue peels away. °· Leave adhesive strips on your skin until the strips fall off. °· Absorbable sutures will dissolve within several days. °· Nonabsorbable sutures and staples must be removed. The location of the wound will determine how long they stay in. This can range from several days to a couple of weeks. °WHEN SHOULD I SEEK HELP FOR MY WOUND CLOSURE? °Contact your health care provider if: °· You have a fever. °· You have chills. °· You have drainage, redness, swelling, or pain at your wound. °· There is a bad smell coming from your wound. °· The skin edges of your wound start to separate after your sutures have been removed. °· Your wound becomes thick, raised, and darker in color after your sutures come out (scarring). °  °This information is not intended to replace advice given to you by your health care provider. Make sure you discuss any questions you have with your health care provider. °  °Document Released: 01/19/2001 Document Revised: 05/17/2014 Document Reviewed: 10/03/2013 °Elsevier Interactive Patient Education ©2016 [employer] ° °

## 2015-07-22 ENCOUNTER — Emergency Department (HOSPITAL_COMMUNITY): Payer: 59

## 2015-07-22 ENCOUNTER — Encounter (HOSPITAL_COMMUNITY): Payer: Self-pay | Admitting: *Deleted

## 2015-07-22 ENCOUNTER — Emergency Department (HOSPITAL_COMMUNITY)
Admission: EM | Admit: 2015-07-22 | Discharge: 2015-07-22 | Disposition: A | Discharge status: Home / Self Care | Payer: 59 | Attending: Emergency Medicine | Admitting: Emergency Medicine

## 2015-07-22 DIAGNOSIS — R42 Dizziness and giddiness: Secondary | ICD-10-CM | POA: Insufficient documentation

## 2015-07-22 DIAGNOSIS — R110 Nausea: Secondary | ICD-10-CM | POA: Insufficient documentation

## 2015-07-22 DIAGNOSIS — F909 Attention-deficit hyperactivity disorder, unspecified type: Secondary | ICD-10-CM | POA: Insufficient documentation

## 2015-07-22 DIAGNOSIS — G8929 Other chronic pain: Secondary | ICD-10-CM | POA: Diagnosis not present

## 2015-07-22 DIAGNOSIS — R5383 Other fatigue: Secondary | ICD-10-CM | POA: Diagnosis not present

## 2015-07-22 DIAGNOSIS — R0602 Shortness of breath: Secondary | ICD-10-CM | POA: Insufficient documentation

## 2015-07-22 DIAGNOSIS — Z7952 Long term (current) use of systemic steroids: Secondary | ICD-10-CM | POA: Diagnosis not present

## 2015-07-22 DIAGNOSIS — R0989 Other specified symptoms and signs involving the circulatory and respiratory systems: Secondary | ICD-10-CM | POA: Diagnosis not present

## 2015-07-22 DIAGNOSIS — Z8701 Personal history of pneumonia (recurrent): Secondary | ICD-10-CM | POA: Insufficient documentation

## 2015-07-22 DIAGNOSIS — F419 Anxiety disorder, unspecified: Secondary | ICD-10-CM | POA: Insufficient documentation

## 2015-07-22 DIAGNOSIS — R079 Chest pain, unspecified: Secondary | ICD-10-CM | POA: Diagnosis present

## 2015-07-22 DIAGNOSIS — G43909 Migraine, unspecified, not intractable, without status migrainosus: Secondary | ICD-10-CM | POA: Diagnosis not present

## 2015-07-22 DIAGNOSIS — J45909 Unspecified asthma, uncomplicated: Secondary | ICD-10-CM | POA: Diagnosis not present

## 2015-07-22 DIAGNOSIS — Z8719 Personal history of other diseases of the digestive system: Secondary | ICD-10-CM | POA: Insufficient documentation

## 2015-07-22 DIAGNOSIS — R05 Cough: Secondary | ICD-10-CM | POA: Diagnosis not present

## 2015-07-22 DIAGNOSIS — R0981 Nasal congestion: Secondary | ICD-10-CM | POA: Insufficient documentation

## 2015-07-22 DIAGNOSIS — D680 Von Willebrand's disease: Secondary | ICD-10-CM | POA: Diagnosis not present

## 2015-07-22 DIAGNOSIS — Z87891 Personal history of nicotine dependence: Secondary | ICD-10-CM | POA: Diagnosis not present

## 2015-07-22 LAB — CBC
HCT: 38.6 % (ref 36.0–46.0)
Hemoglobin: 12.8 g/dL (ref 12.0–15.0)
MCH: 30.9 pg (ref 26.0–34.0)
MCHC: 33.2 g/dL (ref 30.0–36.0)
MCV: 93.2 fL (ref 78.0–100.0)
PLATELETS: 290 10*3/uL (ref 150–400)
RBC: 4.14 MIL/uL (ref 3.87–5.11)
RDW: 12.6 % (ref 11.5–15.5)
WBC: 9.4 10*3/uL (ref 4.0–10.5)

## 2015-07-22 LAB — BASIC METABOLIC PANEL
Anion gap: 12 (ref 5–15)
CHLORIDE: 103 mmol/L (ref 101–111)
CO2: 27 mmol/L (ref 22–32)
CREATININE: 0.73 mg/dL (ref 0.44–1.00)
Calcium: 9.8 mg/dL (ref 8.9–10.3)
GFR calc non Af Amer: >60 mL/min (ref >60)
GLUCOSE: 94 mg/dL (ref 65–99)
Potassium: 3.4 mmol/L — ABNORMAL LOW (ref 3.5–5.1)
Sodium: 142 mmol/L (ref 135–145)

## 2015-07-22 LAB — I-STAT TROPONIN, ED
Troponin i, poc: 0 ng/mL (ref 0.00–0.08)
Troponin i, poc: 0 ng/mL (ref 0.00–0.08)

## 2015-07-22 MED ORDER — KETOROLAC TROMETHAMINE 30 MG/ML IJ SOLN
30.0000 mg | Freq: Once | INTRAMUSCULAR | Status: AC
  Administered 2015-07-22: 30 mg via INTRAVENOUS
  Filled 2015-07-22: qty 1

## 2015-07-22 MED ORDER — RANITIDINE HCL 150 MG PO TABS
150.0000 mg | ORAL_TABLET | Freq: Two times a day (BID) | ORAL | Status: DC
Start: 2015-07-22 — End: 2018-02-16

## 2015-07-22 MED ORDER — ONDANSETRON HCL 4 MG/2ML IJ SOLN
4.0000 mg | Freq: Once | INTRAMUSCULAR | Status: AC
  Administered 2015-07-22: 4 mg via INTRAVENOUS
  Filled 2015-07-22: qty 2

## 2015-07-22 NOTE — ED Provider Notes (Signed)
CSN: [id number]     Arrival date & time 07/22/15  1140 History   First MD Initiated Contact with Patient 07/22/15 1743     Chief Complaint  Patient presents with  . Chest Pain     (Consider location/radiation/quality/duration/timing/severity/associated sxs/prior Treatment) HPI Comments: Patient with a history of von Willebrand's disease presents with chest pain. She states she's had a sharp pain intermittently over the last week. For the last 3 days she's had a pressure feeling to her left chest which radiates to her left arm. She states it's been constant for 3 days. She feels like she can't quite get a deep breath but it does not hurt to breathe. Her symptoms are not worse with breathing. It's not worse with exertion. It's not worse with eating. She does say it's a little bit worse with movement of her chest. She denies any leg pain or swelling. She's had a little bit of runny nose congestion and mild coughing but no fevers. No history of heart problems in the past. No history of hypertension or hyperlipidemia. No family history of heart disease other than her grandfather had heart disease but she doesn't know what age it started.  Patient is a 35 y.o. female presenting with chest pain.  Chest Pain Associated symptoms: fatigue, nausea and shortness of breath   Associated symptoms: no abdominal pain, no back pain, no cough, no diaphoresis, no dizziness, no fever, no headache, no numbness, not vomiting and no weakness     Past Medical History  Diagnosis Date  . Von Willebrand disease (HCC)   . Anemia   . PONV (postoperative nausea and vomiting)   . Asthma     mild case per pt  . Hemorrhoids   . History of esophagogastroduodenoscopy (EGD)   . Nocturia   . ADD (attention deficit disorder)     takes Adderall daily  . Family history of adverse reaction to anesthesia     "Mom gets PONV too"  . Pneumonia "several times"  . Blood dyscrasia     last seen when diagnosed with Von Willebrands  by Dr Cyndie Chime 73yrs ago  . Migraine     "none in the last 6 months" (02/27/2015)  . Headache     "monthly" (02/27/2015)  . Chronic lower back pain   . Depression     has meds prescribed but doesn't take them  . Anxiety     takes Xanax daily as needed   Past Surgical History  Procedure Laterality Date  . Laparoscopy  2011  . Wisdom tooth extraction  2001  . Dilation and curettage of uterus  ~ 1997; ~ 2005  . Cesarean section  2004; 2013  . Multiple tooth extractions  "scattered dates"  . Ercp  03/03/2012    Procedure: ENDOSCOPIC RETROGRADE CHOLANGIOPANCREATOGRAPHY (ERCP);  Surgeon: Theda Belfast, MD;  Location: [facility] ENDOSCOPY;  Service: Endoscopy;  Laterality: N/A;  dl/hung  . Cholecystectomy  03/02/2012    Procedure: LAPAROSCOPIC CHOLECYSTECTOMY WITH INTRAOPERATIVE CHOLANGIOGRAM;  Surgeon: Currie Paris, MD;  Location: [facility] OR;  Service: General;  Laterality: N/A;  . Dilitation & currettage/hystroscopy with thermachoice ablation  04/28/2012    Procedure: DILATATION & CURETTAGE/HYSTEROSCOPY WITH THERMACHOICE ABLATION;  Surgeon: Jeani Hawking, MD;  Location: [facility] ORS;  Service: Gynecology;  Laterality: N/A;  . Back surgery    . Tubal ligation  2013  . Reduction mammaplasty Bilateral 02/27/2015  . Lumbar disc surgery  2003; 2008; 2009  . Posterior lumbar fusion  2010  .  Breast reduction surgery Bilateral 02/27/2015    Procedure: BILATERAL BREAST REDUCTION WITH FREE NIPPLE GRAFT TECHNIQUE ON RIGHT BREAST;  Surgeon: Etter Sjogren, MD;  Location: [facility] OR;  Service: Plastics;  Laterality: Bilateral;   Family History  Problem Relation Age of Onset  . Stroke Mother   . Arthritis Mother   . Depression Mother   . COPD Father   . Kidney disease Paternal Uncle   . Stroke Maternal Grandfather   . Heart disease Paternal Grandfather    Social History  Substance Use Topics  . Smoking status: Former Smoker -- 0.25 packs/day for 2 years    Types: Cigarettes  . Smokeless tobacco: Never  Used     Comment: "quit smoking ~ 2000  . Alcohol Use: 0.0 oz/week    0 Standard drinks or equivalent per week     Comment: 02/27/2015 "might have a few drinks a couple times/yr"   OB History    Gravida Para Term Preterm AB TAB SAB Ectopic Multiple Living   Review of Systems  Constitutional: Positive for fatigue. Negative for fever, chills and diaphoresis.  HENT: Negative for congestion, rhinorrhea and sneezing.   Eyes: Negative.   Respiratory: Positive for shortness of breath. Negative for cough and chest tightness.   Cardiovascular: Positive for chest pain. Negative for leg swelling.  Gastrointestinal: Positive for nausea. Negative for vomiting, abdominal pain, diarrhea and blood in stool.  Genitourinary: Negative for frequency, hematuria, flank pain and difficulty urinating.  Musculoskeletal: Negative for back pain and arthralgias.  Skin: Negative for rash.  Neurological: Positive for light-headedness. Negative for dizziness, speech difficulty, weakness, numbness and headaches.      Allergies  Darvocet; Codeine; and Sulfa antibiotics  Home Medications   Prior to Admission medications   Medication Sig Start Date End Date Taking? Authorizing Provider  ALPRAZolam Prudy Feeler) 1 MG tablet Take 1 mg by mouth 2 (two) times daily as needed for anxiety.   Yes Historical Provider, MD  amphetamine-dextroamphetamine (ADDERALL XR) 20 MG 24 hr capsule Take 20 mg by mouth daily.   Yes Historical Provider, MD  amphetamine-dextroamphetamine (ADDERALL) 20 MG tablet Take 20 mg by mouth every evening.   Yes Historical Provider, MD  ibuprofen (ADVIL,MOTRIN) 200 MG tablet Take 200 mg by mouth every 6 (six) hours as needed for fever.   Yes Historical Provider, MD  loteprednol (LOTEMAX) 0.5 % ophthalmic suspension Place 1 drop into the right eye daily.   Yes Historical Provider, MD  traZODone (DESYREL) 50 MG tablet Take 50-100 mg by mouth at bedtime as needed for sleep (ONLY TAKES ON  WEEKENDS).    Yes Historical Provider, MD  desmopressin (DDAVP) 0.01 % SOLN Place 1 spray into the nose. 1 spray each nostril for 3-5 days after surgical procedure    Levert Feinstein, MD  docusate sodium (COLACE) 100 MG capsule Take 1 capsule (100 mg total) by mouth daily. 02/28/15   Etter Sjogren, MD  HYDROmorphone (DILAUDID) 2 MG tablet Take 1-2 tablets (2-4 mg total) by mouth every 4 (four) hours as needed for moderate pain or severe pain. 02/28/15   Etter Sjogren, MD  methocarbamol (ROBAXIN) 500 MG tablet Take 1 tablet (500 mg total) by mouth every 6 (six) hours as needed for muscle spasms. 02/28/15   Etter Sjogren, MD  ranitidine (ZANTAC) 150 MG tablet Take 1 tablet (150 mg total) by mouth 2 (two) times daily. 07/22/15   Rolan Bucco,  MD   BP 111/93 mmHg  Pulse 77  Temp(Src) 98.7 F (37.1 C) (Oral)  Resp 20  SpO2 100% Physical Exam  Constitutional: She is oriented to person, place, and time. She appears well-developed and well-nourished.  HENT:  Head: Normocephalic and atraumatic.  Eyes: Pupils are equal, round, and reactive to light.  Neck: Normal range of motion. Neck supple.  Cardiovascular: Normal rate, regular rhythm and normal heart sounds.   Pulmonary/Chest: Effort normal and breath sounds normal. No respiratory distress. She has no wheezes. She has no rales. She exhibits no tenderness.  Abdominal: Soft. Bowel sounds are normal. There is no tenderness. There is no rebound and no guarding.  Musculoskeletal: Normal range of motion. She exhibits no edema.  No calf tenderness  Lymphadenopathy:    She has no cervical adenopathy.  Neurological: She is alert and oriented to person, place, and time.  Skin: Skin is warm and dry. No rash noted.  Psychiatric: She has a normal mood and affect.    ED Course  Procedures (including critical care time) Labs Review Results for orders placed or performed during the hospital encounter of 07/22/15  Basic metabolic panel  Result Value Ref  Range   Sodium 142 135 - 145 mmol/L   Potassium 3.4 (L) 3.5 - 5.1 mmol/L   Chloride 103 101 - 111 mmol/L   CO2 27 22 - 32 mmol/L   Glucose, Bld 94 65 - 99 mg/dL   BUN <5 (L) 6 - 20 mg/dL   Creatinine, Ser 7.51 0.44 - 1.00 mg/dL   Calcium 9.8 8.9 - 02.5 mg/dL   GFR calc non Af Amer >60 >60 mL/min   GFR calc Af Amer >60 >60 mL/min   Anion gap 12 5 - 15  CBC  Result Value Ref Range   WBC 9.4 4.0 - 10.5 K/uL   RBC 4.14 3.87 - 5.11 MIL/uL   Hemoglobin 12.8 12.0 - 15.0 g/dL   HCT 85.2 77.8 - 24.2 %   MCV 93.2 78.0 - 100.0 fL   MCH 30.9 26.0 - 34.0 pg   MCHC 33.2 30.0 - 36.0 g/dL   RDW 35.3 61.4 - 43.1 %   Platelets 290 150 - 400 K/uL  I-stat troponin, ED (not at [facility]  Result Value Ref Range   Troponin i, poc 0.00 0.00 - 0.08 ng/mL   Comment 3          I-stat troponin, ED  Result Value Ref Range   Troponin i, poc 0.00 0.00 - 0.08 ng/mL   Comment 3           Dg Chest 2 View  07/22/2015  CLINICAL DATA:  Left-sided chest discomfort for 1 week, initial encounter EXAM: CHEST  2 VIEW COMPARISON:  03/10/2012 FINDINGS: The heart size and mediastinal contours are within normal limits. Both lungs are clear. The visualized skeletal structures are unremarkable. IMPRESSION: No active cardiopulmonary disease. Electronically Signed   By: Alcide Clever M.D.   On: 07/22/2015 12:08      Imaging Review Dg Chest 2 View  07/22/2015  CLINICAL DATA:  Left-sided chest discomfort for 1 week, initial encounter EXAM: CHEST  2 VIEW COMPARISON:  03/10/2012 FINDINGS: The heart size and mediastinal contours are within normal limits. Both lungs are clear. The visualized skeletal structures are unremarkable. IMPRESSION: No active cardiopulmonary disease. Electronically Signed   By: Alcide Clever M.D.   On: 07/22/2015 12:08   I have personally reviewed and evaluated these images and lab results  as part of my medical decision-making.   EKG Interpretation   Date/Time:  Tuesday July 22 2015 11:44:36  EDT Ventricular Rate:  81 PR Interval:  168 QRS Duration: 96 QT Interval:  388 QTC Calculation: 450 R Axis:   63 Text Interpretation:  Normal sinus rhythm with sinus arrhythmia Normal ECG  since last tracing no significant change Confirmed by Ralpheal Zappone  MD, Antonique Langford  ([id number]) on 07/22/2015 5:51:48 PM      MDM   Final diagnoses:  Chest pain, unspecified chest pain type    Patient presents with chest pain. She's had intermittent pain for the last week with persistent pain for the last 3 days. It's been constant. There is no exertional symptoms. She's had 2 troponins which appeared negative. She has no ischemic changes on EKG. She has no other suggestions of a PE. She has no hypoxia. No signs of fluid overload. She has a low heart score of 2. I feel that she can be further evaluated as an outpatient. She was discharged home in good condition. She was started on Zantac. She was advised to follow-up with her PCP in 2 days or return here as needed for any worsening symptoms.    Rolan Bucco, MD 07/22/15 [phone]

## 2015-07-22 NOTE — Discharge Instructions (Signed)
Nonspecific Chest Pain  °Chest pain can be caused by many different conditions. There is always a chance that your pain could be related to something serious, such as a heart attack or a blood clot in your lungs. Chest pain can also be caused by conditions that are not life-threatening. If you have chest pain, it is very important to follow up with your health care provider. °CAUSES  °Chest pain can be caused by: °· Heartburn. °· Pneumonia or bronchitis. °· Anxiety or stress. °· Inflammation around your heart (pericarditis) or lung (pleuritis or pleurisy). °· A blood clot in your lung. °· A collapsed lung (pneumothorax). It can develop suddenly on its own (spontaneous pneumothorax) or from trauma to the chest. °· Shingles infection (varicella-zoster virus). °· Heart attack. °· Damage to the bones, muscles, and cartilage that make up your chest wall. This can include: °¨ Bruised bones due to injury. °¨ Strained muscles or cartilage due to frequent or repeated coughing or overwork. °¨ Fracture to one or more ribs. °¨ Sore cartilage due to inflammation (costochondritis). °RISK FACTORS  °Risk factors for chest pain may include: °· Activities that increase your risk for trauma or injury to your chest. °· Respiratory infections or conditions that cause frequent coughing. °· Medical conditions or overeating that can cause heartburn. °· Heart disease or family history of heart disease. °· Conditions or health behaviors that increase your risk of developing a blood clot. °· Having had chicken pox (varicella zoster). °SIGNS AND SYMPTOMS °Chest pain can feel like: °· Burning or tingling on the surface of your chest or deep in your chest. °· Crushing, pressure, aching, or squeezing pain. °· Dull or sharp pain that is worse when you move, cough, or take a deep breath. °· Pain that is also felt in your back, neck, shoulder, or arm, or pain that spreads to any of these areas. °Your chest pain may come and go, or it may stay  constant. °DIAGNOSIS °Lab tests or other studies may be needed to find the cause of your pain. Your health care provider may have you take a test called an ambulatory ECG (electrocardiogram). An ECG records your heartbeat patterns at the time the test is performed. You may also have other tests, such as: °· Transthoracic echocardiogram (TTE). During echocardiography, sound waves are used to create a picture of all of the heart structures and to look at how blood flows through your heart. °· Transesophageal echocardiogram (TEE). This is a more advanced imaging test that obtains images from inside your body. It allows your health care provider to see your heart in finer detail. °· Cardiac monitoring. This allows your health care provider to monitor your heart rate and rhythm in real time. °· Holter monitor. This is a portable device that records your heartbeat and can help to diagnose abnormal heartbeats. It allows your health care provider to track your heart activity for several days, if needed. °· Stress tests. These can be done through exercise or by taking medicine that makes your heart beat more quickly. °· Blood tests. °· Imaging tests. °TREATMENT  °Your treatment depends on what is causing your chest pain. Treatment may include: °· Medicines. These may include: °¨ Acid blockers for heartburn. °¨ Anti-inflammatory medicine. °¨ Pain medicine for inflammatory conditions. °¨ Antibiotic medicine, if an infection is present. °¨ Medicines to dissolve blood clots. °¨ Medicines to treat coronary artery disease. °· Supportive care for conditions that do not require medicines. This may include: °¨ Resting. °¨ Applying heat   or cold packs to injured areas. °¨ Limiting activities until pain decreases. °HOME CARE INSTRUCTIONS °· If you were prescribed an antibiotic medicine, finish it all even if you start to feel better. °· Avoid any activities that bring on chest pain. °· Do not use any tobacco products, including  cigarettes, chewing tobacco, or electronic cigarettes. If you need help quitting, ask your health care provider. °· Do not drink alcohol. °· Take medicines only as directed by your health care provider. °· Keep all follow-up visits as directed by your health care provider. This is important. This includes any further testing if your chest pain does not go away. °· If heartburn is the cause for your chest pain, you may be told to keep your head raised (elevated) while sleeping. This reduces the chance that acid will go from your stomach into your esophagus. °· Make lifestyle changes as directed by your health care provider. These may include: °¨ Getting regular exercise. Ask your health care provider to suggest some activities that are safe for you. °¨ Eating a heart-healthy diet. A registered dietitian can help you to learn healthy eating options. °¨ Maintaining a healthy weight. °¨ Managing diabetes, if necessary. °¨ Reducing stress. °SEEK MEDICAL CARE IF: °· Your chest pain does not go away after treatment. °· You have a rash with blisters on your chest. °· You have a fever. °SEEK IMMEDIATE MEDICAL CARE IF:  °· Your chest pain is worse. °· You have an increasing cough, or you cough up blood. °· You have severe abdominal pain. °· You have severe weakness. °· You faint. °· You have chills. °· You have sudden, unexplained chest discomfort. °· You have sudden, unexplained discomfort in your arms, back, neck, or jaw. °· You have shortness of breath at any time. °· You suddenly start to sweat, or your skin gets clammy. °· You feel nauseous or you vomit. °· You suddenly feel light-headed or dizzy. °· Your heart begins to beat quickly, or it feels like it is skipping beats. °These symptoms may represent a serious problem that is an emergency. Do not wait to see if the symptoms will go away. Get medical help right away. Call your local emergency services (911 in the [place] Do not drive yourself to the hospital. °  °This  information is not intended to replace advice given to you by your health care provider. Make sure you discuss any questions you have with your health care provider. °  °Document Released: 02/03/2005 Document Revised: 05/17/2014 Document Reviewed: 11/30/2013 °Elsevier Interactive Patient Education ©2016 [employer] ° °

## 2015-07-22 NOTE — ED Notes (Signed)
Pt is here with chest pain that has been going on for the last week and half.  Last nite it got worse and felt tight.  Pt states that she feels like she cannot get a deep breath.  Pt has Von Willebrand Dz and has some trouble controlling bleeding. Pt denies any history of blood clots.

## 2016-11-23 ENCOUNTER — Emergency Department (HOSPITAL_COMMUNITY)
Admission: EM | Admit: 2016-11-23 | Discharge: 2016-11-23 | Disposition: A | Discharge status: Home / Self Care | Payer: 59 | Attending: Emergency Medicine | Admitting: Emergency Medicine

## 2016-11-23 ENCOUNTER — Other Ambulatory Visit: Payer: Self-pay

## 2016-11-23 ENCOUNTER — Encounter (HOSPITAL_COMMUNITY): Payer: Self-pay

## 2016-11-23 ENCOUNTER — Emergency Department (HOSPITAL_COMMUNITY): Payer: 59

## 2016-11-23 DIAGNOSIS — R079 Chest pain, unspecified: Secondary | ICD-10-CM | POA: Diagnosis present

## 2016-11-23 DIAGNOSIS — Z87891 Personal history of nicotine dependence: Secondary | ICD-10-CM | POA: Diagnosis not present

## 2016-11-23 DIAGNOSIS — Z79899 Other long term (current) drug therapy: Secondary | ICD-10-CM | POA: Diagnosis not present

## 2016-11-23 DIAGNOSIS — R42 Dizziness and giddiness: Secondary | ICD-10-CM | POA: Insufficient documentation

## 2016-11-23 DIAGNOSIS — J45909 Unspecified asthma, uncomplicated: Secondary | ICD-10-CM | POA: Insufficient documentation

## 2016-11-23 DIAGNOSIS — F909 Attention-deficit hyperactivity disorder, unspecified type: Secondary | ICD-10-CM | POA: Insufficient documentation

## 2016-11-23 LAB — BASIC METABOLIC PANEL
Anion gap: 7 (ref 5–15)
BUN: 8 mg/dL (ref 6–20)
CALCIUM: 9.6 mg/dL (ref 8.9–10.3)
CO2: 25 mmol/L (ref 22–32)
CREATININE: 0.85 mg/dL (ref 0.44–1.00)
Chloride: 106 mmol/L (ref 101–111)
GFR calc Af Amer: >60 mL/min (ref >60)
GFR calc non Af Amer: >60 mL/min (ref >60)
GLUCOSE: 105 mg/dL — AB (ref 65–99)
Potassium: 3.9 mmol/L (ref 3.5–5.1)
Sodium: 138 mmol/L (ref 135–145)

## 2016-11-23 LAB — CBC
HCT: 39.2 % (ref 36.0–46.0)
HEMOGLOBIN: 13 g/dL (ref 12.0–15.0)
MCH: 31 pg (ref 26.0–34.0)
MCHC: 33.2 g/dL (ref 30.0–36.0)
MCV: 93.3 fL (ref 78.0–100.0)
PLATELETS: 296 10*3/uL (ref 150–400)
RBC: 4.2 MIL/uL (ref 3.87–5.11)
RDW: 12.8 % (ref 11.5–15.5)
WBC: 10.1 10*3/uL (ref 4.0–10.5)

## 2016-11-23 LAB — I-STAT BETA HCG BLOOD, ED (MC, WL, AP ONLY): I-stat hCG, quantitative: <5 m[IU]/mL (ref <5)

## 2016-11-23 LAB — I-STAT TROPONIN, ED: TROPONIN I, POC: 0 ng/mL (ref 0.00–0.08)

## 2016-11-23 NOTE — Discharge Instructions (Signed)
Follow-up with cardiology for further evaluation and testing. Return if worsening symptoms.

## 2016-11-23 NOTE — ED Notes (Signed)
Voice mail left at [facility] medical center to obtain "abnormal" EKG done there. Pt remains on heart monitor here in [place].

## 2016-11-23 NOTE — ED Notes (Signed)
PA Kirichenko able to speak with [facility] medical center and faxed over ekgs.

## 2016-11-23 NOTE — ED Triage Notes (Signed)
Pt reports central chest pain described as a tightness for about a month. She went to her doctors office today for weight loss management and her doctor listened to her heart and did an EKG and was told both were abnormal and "i have a level one blockage" and was told to come to the ER. CP is also associated with SOB.

## 2016-11-23 NOTE — ED Provider Notes (Signed)
[facility]-EMERGENCY DEPT Provider Note   CSN: [id number] Arrival date & time: 11/23/16  1203  By signing my name below, I, Ann [name], attest that this documentation has been prepared under the direction and in the presence of Ann Urbanik, PA-C. Electronically Signed: Cynda [name], Scribe. 11/23/16. 1:58 PM.  History   Chief Complaint Chief Complaint  Patient presents with  . Chest Pain   HPI Comments: Ann [name] is a 36 y.o. female with a history of tobacco abuse, who presents to the Emergency Department complaining of persistent, intermittent central chest pain that began one month ago. Patient states she intermittently develops chest pain. Patient reports developing two episodes of chest pain today, once this morning at 9 am and another one at 11:30 am that was progressively worse. Pain is not exertional, not associated with eating or any activities. Patient presents from her primary provider's office (Ann [name]), where she was advised to come here due "abnormal blocks." pt stated they did several ECGs each showing something different and one showing first degree block, one showing second degree block, and one normal.  Patient denies any cardiac history. Patient reports associated lightheadedness and shortness of breath. No medications taken prior to arrival. Patient denies any history of hyperlipidemia, hypertension, or tobacco use. Does not drink caffeine. Patient does report a family history of CABG in her grandfather. Patient denies any fever, chills, nausea, vomiting, dizziness, or any additional symptoms.   The history is provided by the patient. No language interpreter was used.    Past Medical History:  Diagnosis Date  . ADD (attention deficit disorder)    takes Adderall daily  . Anemia   . Anxiety    takes Xanax daily as needed  . Asthma    mild case per pt  . Blood dyscrasia    last seen when diagnosed with Von Willebrands by Dr Cyndie Chime  24yrs ago  . Chronic lower back pain   . Depression    has meds prescribed but doesn't take them  . Family history of adverse reaction to anesthesia    "Ann [name] gets PONV too"  . Headache    "monthly" (02/27/2015)  . Hemorrhoids   . History of esophagogastroduodenoscopy (EGD)   . Migraine    "none in the last 6 months" (02/27/2015)  . Nocturia   . Pneumonia "several times"  . PONV (postoperative nausea and vomiting)   . Von Willebrand disease [facility])     Patient Active Problem List   Diagnosis Date Noted  . Breast hypertrophy in female 02/27/2015  . Hyponatremia 03/04/2012  . Acute cholecystitis 03/02/2012  . Morbid obesity (HCC) 08/25/2011  . Von Willebrand disease - hx PPH 04/14/2011  . History of abnormal Pap smear - had colpo 04/14/2011  . Infertility, female 04/14/2011  . HSV-2 infection - history, no outbreaks 04/14/2011  . Anemia 04/14/2011  . Asthma - uses inhaler PRN 04/14/2011  . History of pyelonephritis - frequent history as a child 04/14/2011  . Migraine 04/14/2011  . History of depression 04/14/2011  . History of smoking 04/14/2011    Past Surgical History:  Procedure Laterality Date  . BACK SURGERY    . BREAST REDUCTION SURGERY Bilateral 02/27/2015   Procedure: BILATERAL BREAST REDUCTION WITH FREE NIPPLE GRAFT TECHNIQUE ON RIGHT BREAST;  Surgeon: Etter Sjogren, MD;  Location: [name] OR;  Service: Plastics;  Laterality: Bilateral;  . CESAREAN SECTION  2004; 2013  . CHOLECYSTECTOMY  03/02/2012   Procedure: LAPAROSCOPIC CHOLECYSTECTOMY WITH INTRAOPERATIVE CHOLANGIOGRAM;  Surgeon:  Currie Paris, MD;  Location: [facility] OR;  Service: General;  Laterality: N/A;  . DILATION AND CURETTAGE OF UTERUS  ~ 1997; ~ 2005  . DILITATION & CURRETTAGE/HYSTROSCOPY WITH THERMACHOICE ABLATION  04/28/2012   Procedure: DILATATION & CURETTAGE/HYSTEROSCOPY WITH THERMACHOICE ABLATION;  Surgeon: Jeani Hawking, MD;  Location: [facility] ORS;  Service: Gynecology;  Laterality: N/A;  . ERCP  03/03/2012    Procedure: ENDOSCOPIC RETROGRADE CHOLANGIOPANCREATOGRAPHY (ERCP);  Surgeon: Theda Belfast, MD;  Location: [facility] ENDOSCOPY;  Service: Endoscopy;  Laterality: N/A;  dl/hung  . LAPAROSCOPY  2011  . LUMBAR DISC SURGERY  2003; 2008; 2009  . MULTIPLE TOOTH EXTRACTIONS  "scattered dates"  . POSTERIOR LUMBAR FUSION  2010  . REDUCTION MAMMAPLASTY Bilateral 02/27/2015  . TUBAL LIGATION  2013  . WISDOM TOOTH EXTRACTION  2001    OB History    Gravida Para Term Preterm AB Living   3 2 2   1 2    SAB TAB Ectopic Multiple Live Births     1     2       Home Medications    Prior to Admission medications   Medication Sig Start Date End Date Taking? Authorizing Provider  ALPRAZolam Prudy Feeler) 1 MG tablet Take 1 mg by mouth 2 (two) times daily as needed for anxiety.    [provider]  amphetamine-dextroamphetamine (ADDERALL XR) 20 MG 24 hr capsule Take 20 mg by mouth daily.    [provider]  amphetamine-dextroamphetamine (ADDERALL) 20 MG tablet Take 20 mg by mouth every evening.    [provider]  desmopressin (DDAVP) 0.01 % SOLN Place 1 spray into the nose. 1 spray each nostril for 3-5 days after surgical procedure    Levert Feinstein, MD  docusate sodium (COLACE) 100 MG capsule Take 1 capsule (100 mg total) by mouth daily. 02/28/15   Etter Sjogren, MD  HYDROmorphone (DILAUDID) 2 MG tablet Take 1-2 tablets (2-4 mg total) by mouth every 4 (four) hours as needed for moderate pain or severe pain. 02/28/15   Etter Sjogren, MD  ibuprofen (ADVIL,MOTRIN) 200 MG tablet Take 200 mg by mouth every 6 (six) hours as needed for fever.    [provider]  loteprednol (LOTEMAX) 0.5 % ophthalmic suspension Place 1 drop into the right eye daily.    [provider]  methocarbamol (ROBAXIN) 500 MG tablet Take 1 tablet (500 mg total) by mouth every 6 (six) hours as needed for muscle spasms. 02/28/15   Etter Sjogren, MD  ranitidine (ZANTAC) 150 MG tablet Take 1 tablet (150  mg total) by mouth 2 (two) times daily. 07/22/15   Rolan Bucco, MD  traZODone (DESYREL) 50 MG tablet Take 50-100 mg by mouth at bedtime as needed for sleep (ONLY TAKES ON WEEKENDS).     [provider]    Family History Family History  Problem Relation Age of Onset  . Stroke Mother   . Arthritis Mother   . Depression Mother   . COPD Father   . Kidney disease Paternal Uncle   . Stroke Maternal Grandfather   . Heart disease Paternal Grandfather     Social History Social History  Substance Use Topics  . Smoking status: Former Smoker    Packs/day: 0.25    Years: 2.00    Types: Cigarettes  . Smokeless tobacco: Never Used     Comment: "quit smoking ~ 2000  . Alcohol use 0.0 oz/week     Comment: 02/27/2015 "might have a few drinks  a couple times/yr"     Allergies   Darvocet [propoxyphene n-acetaminophen]; Codeine; and Sulfa antibiotics   Review of Systems Review of Systems  Constitutional: Negative for chills and fever.  Respiratory: Positive for chest tightness and shortness of breath.   Cardiovascular: Positive for chest pain.  Gastrointestinal: Negative for diarrhea, nausea and vomiting.  Neurological: Positive for light-headedness. Negative for dizziness and headaches.  All other systems reviewed and are negative.    Physical Exam Updated Vital Signs BP 129/87 (BP Location: Right Arm)   Pulse 83   Temp 98 F (36.7 C) (Oral)   Resp 17   SpO2 96%   Physical Exam  Constitutional: She is oriented to person, place, and time. She appears well-developed and well-nourished.  HENT:  Head: Normocephalic and atraumatic.  Eyes: Pupils are equal, round, and reactive to light. EOM are normal.  Neck: Normal range of motion. Neck supple.  Cardiovascular: Normal rate, regular rhythm and normal heart sounds.   Pulmonary/Chest: Effort normal and breath sounds normal. No respiratory distress.  Musculoskeletal: Normal range of motion.  Neurological: She is alert and  oriented to person, place, and time.  Skin: Skin is warm and dry.  Psychiatric: She has a normal mood and affect.  Nursing note and vitals reviewed.    ED Treatments / Results  DIAGNOSTIC STUDIES: Oxygen Saturation is 96% on RA, normal by my interpretation.    COORDINATION OF CARE: 1:57 PM Discussed treatment plan with pt at bedside and pt agreed to plan, which includes telemetry observation.   Labs (all labs ordered are listed, but only abnormal results are displayed) Labs Reviewed  BASIC METABOLIC PANEL - Abnormal; Notable for the following:       Result Value   Glucose, Bld 105 (*)    All other components within normal limits  CBC  I-STAT TROPOININ, ED  I-STAT BETA HCG BLOOD, ED ([facility], [facility], [facility] ONLY)    EKG  EKG Interpretation None       Radiology Dg Chest 2 View  Result Date: 11/23/2016 CLINICAL DATA:  36 year old female with chest pain. Abnormal EEG. Asthma. Initial encounter. EXAM: CHEST  2 VIEW COMPARISON:  07/22/2015 in 02/2017 2013 FINDINGS: Heart size top-normal.  Mediastinal structures unremarkable. No infiltrate, congestive heart failure or pneumothorax. No plain film evidence of pulmonary malignancy. No acute osseous abnormality IMPRESSION: No active cardiopulmonary disease. Electronically Signed   By: Lacy Duverney M.D.   On: 11/23/2016 12:43    Procedures Procedures (including critical care time)  Medications Ordered in ED Medications - No data to display   Initial Impression / Assessment and Plan / ED Course  I have reviewed the triage vital signs and the nursing notes.  Pertinent labs & imaging results that were available during my care of the patient were reviewed by me and considered in my medical decision making (see chart for details).     Patient in emergency department with atypical chest pains that have been going on for a month. Also here because she had abnormal EKG done by her family doctor which showed possible heart block. Patient is  currently asymptomatic. She is in no acute distress. She is a low risk for cardiac disease with heart score of 1. Her EKG here is normal. We will request EKGs from the office from this morning, will get labs including troponin, electrolytes, will monitor.   3:33 PM EKGs from this morning obtained, I discussed them with Dr. Dalene Seltzer, we do not think that they show first-degree  and second-degree heart block, but most likely PACs. Patient has been monitored in emergency department, his entire time in sinus rhythm with no PACs or PVCs or any heart blocks observed. She appears to be comfortable, vital signs are normal. Troponin negative. Patient is stable for discharge home at this time. I do not think she needs any further testing and emergency department. I will have her follow-up with cardiology. Results discussed with patient, who agrees to the plan. Return precautions discussed.  Vitals:   11/23/16 1209 11/23/16 1337  BP: (!) 150/70 129/87  Pulse: 82 83  Resp: 18 17  Temp: 98.2 F (36.8 C) 98 F (36.7 C)  TempSrc: Oral Oral  SpO2: 99% 96%     Final Clinical Impressions(s) / ED Diagnoses   Final diagnoses:  Chest pain, unspecified type    New Prescriptions New Prescriptions   No medications on file   I personally performed the services described in this documentation, which was scribed in my presence. The recorded information has been reviewed and is accurate.     Jaynie Crumble, PA-C 11/23/16 1536    Alvira Monday, MD 11/24/16 1350

## 2018-02-16 ENCOUNTER — Other Ambulatory Visit: Payer: Self-pay

## 2018-02-16 ENCOUNTER — Encounter (HOSPITAL_BASED_OUTPATIENT_CLINIC_OR_DEPARTMENT_OTHER): Payer: Self-pay | Admitting: *Deleted

## 2018-02-16 NOTE — Progress Notes (Signed)
Consulted with Dr. Miguel Rota in regards to the pts Von Willebrand disorder and no orders were received for bloodwork prior to DOS.

## 2018-02-17 ENCOUNTER — Other Ambulatory Visit: Payer: Self-pay | Admitting: General Surgery

## 2018-02-17 NOTE — Progress Notes (Signed)
Pre-surgical Ensure along with its instructions were given to pt. All questions and concerns addressed.

## 2018-02-20 ENCOUNTER — Encounter (HOSPITAL_BASED_OUTPATIENT_CLINIC_OR_DEPARTMENT_OTHER): Payer: Self-pay | Admitting: Certified Registered"

## 2018-02-20 ENCOUNTER — Encounter (HOSPITAL_BASED_OUTPATIENT_CLINIC_OR_DEPARTMENT_OTHER)
Admission: RE | Disposition: A | Discharge status: Home / Self Care | Payer: Self-pay | Source: Ambulatory Visit | Attending: General Surgery

## 2018-02-20 ENCOUNTER — Ambulatory Visit (HOSPITAL_BASED_OUTPATIENT_CLINIC_OR_DEPARTMENT_OTHER): Payer: 59 | Admitting: Anesthesiology

## 2018-02-20 ENCOUNTER — Ambulatory Visit (HOSPITAL_BASED_OUTPATIENT_CLINIC_OR_DEPARTMENT_OTHER)
Admission: RE | Admit: 2018-02-20 | Discharge: 2018-02-20 | Disposition: A | Discharge status: Home / Self Care | Payer: 59 | Source: Ambulatory Visit | Attending: General Surgery | Admitting: General Surgery

## 2018-02-20 DIAGNOSIS — Z8249 Family history of ischemic heart disease and other diseases of the circulatory system: Secondary | ICD-10-CM | POA: Insufficient documentation

## 2018-02-20 DIAGNOSIS — Z8042 Family history of malignant neoplasm of prostate: Secondary | ICD-10-CM | POA: Diagnosis not present

## 2018-02-20 DIAGNOSIS — Z8371 Family history of colonic polyps: Secondary | ICD-10-CM | POA: Diagnosis not present

## 2018-02-20 DIAGNOSIS — M549 Dorsalgia, unspecified: Secondary | ICD-10-CM | POA: Diagnosis not present

## 2018-02-20 DIAGNOSIS — N6002 Solitary cyst of left breast: Secondary | ICD-10-CM | POA: Diagnosis not present

## 2018-02-20 DIAGNOSIS — F419 Anxiety disorder, unspecified: Secondary | ICD-10-CM | POA: Diagnosis not present

## 2018-02-20 DIAGNOSIS — Z832 Family history of diseases of the blood and blood-forming organs and certain disorders involving the immune mechanism: Secondary | ICD-10-CM | POA: Diagnosis not present

## 2018-02-20 DIAGNOSIS — Z87891 Personal history of nicotine dependence: Secondary | ICD-10-CM | POA: Insufficient documentation

## 2018-02-20 DIAGNOSIS — Z811 Family history of alcohol abuse and dependence: Secondary | ICD-10-CM | POA: Diagnosis not present

## 2018-02-20 DIAGNOSIS — G43909 Migraine, unspecified, not intractable, without status migrainosus: Secondary | ICD-10-CM | POA: Diagnosis not present

## 2018-02-20 DIAGNOSIS — Z836 Family history of other diseases of the respiratory system: Secondary | ICD-10-CM | POA: Insufficient documentation

## 2018-02-20 DIAGNOSIS — Z6841 Body Mass Index (BMI) 40.0 and over, adult: Secondary | ICD-10-CM | POA: Insufficient documentation

## 2018-02-20 DIAGNOSIS — Z833 Family history of diabetes mellitus: Secondary | ICD-10-CM | POA: Insufficient documentation

## 2018-02-20 DIAGNOSIS — L720 Epidermal cyst: Secondary | ICD-10-CM | POA: Insufficient documentation

## 2018-02-20 DIAGNOSIS — M1990 Unspecified osteoarthritis, unspecified site: Secondary | ICD-10-CM | POA: Insufficient documentation

## 2018-02-20 DIAGNOSIS — Z79899 Other long term (current) drug therapy: Secondary | ICD-10-CM | POA: Diagnosis not present

## 2018-02-20 DIAGNOSIS — D680 Von Willebrand's disease: Secondary | ICD-10-CM | POA: Insufficient documentation

## 2018-02-20 DIAGNOSIS — Z8349 Family history of other endocrine, nutritional and metabolic diseases: Secondary | ICD-10-CM | POA: Diagnosis not present

## 2018-02-20 DIAGNOSIS — Z8261 Family history of arthritis: Secondary | ICD-10-CM | POA: Insufficient documentation

## 2018-02-20 DIAGNOSIS — Z885 Allergy status to narcotic agent status: Secondary | ICD-10-CM | POA: Insufficient documentation

## 2018-02-20 DIAGNOSIS — Z818 Family history of other mental and behavioral disorders: Secondary | ICD-10-CM | POA: Diagnosis not present

## 2018-02-20 DIAGNOSIS — F329 Major depressive disorder, single episode, unspecified: Secondary | ICD-10-CM | POA: Insufficient documentation

## 2018-02-20 DIAGNOSIS — Z882 Allergy status to sulfonamides status: Secondary | ICD-10-CM | POA: Insufficient documentation

## 2018-02-20 DIAGNOSIS — Z7984 Long term (current) use of oral hypoglycemic drugs: Secondary | ICD-10-CM | POA: Diagnosis not present

## 2018-02-20 HISTORY — PX: BREAST CYST EXCISION: SHX579

## 2018-02-20 SURGERY — EXCISION, CYST, BREAST
Anesthesia: General | Site: Breast | Laterality: Left

## 2018-02-20 MED ORDER — LACTATED RINGERS IV SOLN
INTRAVENOUS | Status: DC
  Administered 2018-02-20: 13:00:00 via INTRAVENOUS

## 2018-02-20 MED ORDER — CEFAZOLIN SODIUM-DEXTROSE 2-4 GM/100ML-% IV SOLN
INTRAVENOUS | Status: AC
  Filled 2018-02-20: qty 100

## 2018-02-20 MED ORDER — ACETAMINOPHEN 500 MG PO TABS
ORAL_TABLET | ORAL | Status: AC
  Filled 2018-02-20: qty 2

## 2018-02-20 MED ORDER — ACETAMINOPHEN 500 MG PO TABS
1000.0000 mg | ORAL_TABLET | ORAL | Status: AC
  Administered 2018-02-20: 1000 mg via ORAL

## 2018-02-20 MED ORDER — MIDAZOLAM HCL 2 MG/2ML IJ SOLN
1.0000 mg | INTRAMUSCULAR | Status: DC | PRN
  Administered 2018-02-20: 2 mg via INTRAVENOUS

## 2018-02-20 MED ORDER — FENTANYL CITRATE (PF) 100 MCG/2ML IJ SOLN
50.0000 ug | INTRAMUSCULAR | Status: DC | PRN
  Administered 2018-02-20: 100 ug via INTRAVENOUS

## 2018-02-20 MED ORDER — FENTANYL CITRATE (PF) 100 MCG/2ML IJ SOLN
INTRAMUSCULAR | Status: AC
  Filled 2018-02-20: qty 2

## 2018-02-20 MED ORDER — ONDANSETRON HCL 4 MG/2ML IJ SOLN
INTRAMUSCULAR | Status: DC | PRN
  Administered 2018-02-20: 4 mg via INTRAVENOUS

## 2018-02-20 MED ORDER — 0.9 % SODIUM CHLORIDE (POUR BTL) OPTIME
TOPICAL | Status: DC | PRN
  Administered 2018-02-20: 1000 mL

## 2018-02-20 MED ORDER — MIDAZOLAM HCL 2 MG/2ML IJ SOLN
INTRAMUSCULAR | Status: AC
  Filled 2018-02-20: qty 2

## 2018-02-20 MED ORDER — BACITRACIN 500 UNIT/GM EX OINT
TOPICAL_OINTMENT | CUTANEOUS | Status: DC | PRN
  Administered 2018-02-20: 1 via TOPICAL

## 2018-02-20 MED ORDER — BUPIVACAINE-EPINEPHRINE (PF) 0.25% -1:200000 IJ SOLN
INTRAMUSCULAR | Status: DC | PRN
  Administered 2018-02-20: 7 mL

## 2018-02-20 MED ORDER — DEXAMETHASONE SODIUM PHOSPHATE 10 MG/ML IJ SOLN
INTRAMUSCULAR | Status: DC | PRN
  Administered 2018-02-20: 10 mg via INTRAVENOUS

## 2018-02-20 MED ORDER — PHENYLEPHRINE HCL 10 MG/ML IJ SOLN
INTRAMUSCULAR | Status: DC | PRN
  Administered 2018-02-20: 80 ug via INTRAVENOUS

## 2018-02-20 MED ORDER — CEFAZOLIN SODIUM-DEXTROSE 2-4 GM/100ML-% IV SOLN
2.0000 g | INTRAVENOUS | Status: AC
  Administered 2018-02-20: 2 g via INTRAVENOUS

## 2018-02-20 MED ORDER — SCOPOLAMINE 1 MG/3DAYS TD PT72: 1.0000 | MEDICATED_PATCH | Freq: Once | TRANSDERMAL | Status: DC | PRN

## 2018-02-20 MED ORDER — EPHEDRINE SULFATE 50 MG/ML IJ SOLN
INTRAMUSCULAR | Status: DC | PRN
  Administered 2018-02-20: 10 mg via INTRAVENOUS

## 2018-02-20 MED ORDER — PROPOFOL 10 MG/ML IV BOLUS
INTRAVENOUS | Status: DC | PRN
  Administered 2018-02-20: 200 mg via INTRAVENOUS

## 2018-02-20 MED ORDER — LIDOCAINE HCL (CARDIAC) PF 100 MG/5ML IV SOSY
PREFILLED_SYRINGE | INTRAVENOUS | Status: DC | PRN
  Administered 2018-02-20: 60 mg via INTRAVENOUS

## 2018-02-20 SURGICAL SUPPLY — 58 items
ADH SKN CLS APL DERMABOND .7 (GAUZE/BANDAGES/DRESSINGS)
BINDER BREAST LRG (GAUZE/BANDAGES/DRESSINGS) IMPLANT
BINDER BREAST MEDIUM (GAUZE/BANDAGES/DRESSINGS) IMPLANT
BINDER BREAST XLRG (GAUZE/BANDAGES/DRESSINGS) IMPLANT
BINDER BREAST XXLRG (GAUZE/BANDAGES/DRESSINGS) IMPLANT
BLADE SURG 15 STRL LF DISP TIS (BLADE) ×1 IMPLANT
BLADE SURG 15 STRL SS (BLADE) ×3
CANISTER SUCT 1200ML W/VALVE (MISCELLANEOUS) IMPLANT
CHLORAPREP W/TINT 26ML (MISCELLANEOUS) ×3 IMPLANT
CLIP VESOCCLUDE SM WIDE 6/CT (CLIP) IMPLANT
CLOSURE WOUND 1/2 X4 (GAUZE/BANDAGES/DRESSINGS) ×1
COVER BACK TABLE 60X90IN (DRAPES) ×3 IMPLANT
COVER MAYO STAND STRL (DRAPES) ×3 IMPLANT
COVER WAND RF STERILE (DRAPES) IMPLANT
DECANTER SPIKE VIAL GLASS SM (MISCELLANEOUS) IMPLANT
DERMABOND ADVANCED (GAUZE/BANDAGES/DRESSINGS)
DERMABOND ADVANCED .7 DNX12 (GAUZE/BANDAGES/DRESSINGS) IMPLANT
DEVICE DUBIN W/COMP PLATE 8390 (MISCELLANEOUS) IMPLANT
DRAPE LAPAROSCOPIC ABDOMINAL (DRAPES) ×3 IMPLANT
DRSG TEGADERM 4X4.75 (GAUZE/BANDAGES/DRESSINGS) ×3 IMPLANT
ELECT COATED BLADE 2.86 ST (ELECTRODE) ×3 IMPLANT
ELECT REM PT RETURN 9FT ADLT (ELECTROSURGICAL) ×3
ELECTRODE REM PT RTRN 9FT ADLT (ELECTROSURGICAL) ×1 IMPLANT
GAUZE PACKING IODOFORM 1/4X5 (PACKING) ×2 IMPLANT
GAUZE SPONGE 4X4 12PLY STRL LF (GAUZE/BANDAGES/DRESSINGS) ×3 IMPLANT
GLOVE BIO SURGEON STRL SZ7 (GLOVE) ×3 IMPLANT
GLOVE BIOGEL PI IND STRL 7.5 (GLOVE) ×1 IMPLANT
GLOVE BIOGEL PI INDICATOR 7.5 (GLOVE) ×2
GOWN STRL REUS W/ TWL LRG LVL3 (GOWN DISPOSABLE) ×3 IMPLANT
GOWN STRL REUS W/TWL LRG LVL3 (GOWN DISPOSABLE) ×9
ILLUMINATOR WAVEGUIDE N/F (MISCELLANEOUS) IMPLANT
LIGHT WAVEGUIDE WIDE FLAT (MISCELLANEOUS) IMPLANT
NDL HYPO 25X1 1.5 SAFETY (NEEDLE) ×1 IMPLANT
NEEDLE HYPO 25X1 1.5 SAFETY (NEEDLE) ×3 IMPLANT
NS IRRIG 1000ML POUR BTL (IV SOLUTION) IMPLANT
PACK BASIN DAY SURGERY FS (CUSTOM PROCEDURE TRAY) ×3 IMPLANT
PENCIL BUTTON HOLSTER BLD 10FT (ELECTRODE) ×3 IMPLANT
SLEEVE SCD COMPRESS KNEE MED (MISCELLANEOUS) ×3 IMPLANT
SPONGE LAP 4X18 RFD (DISPOSABLE) ×3 IMPLANT
STRIP CLOSURE SKIN 1/2X4 (GAUZE/BANDAGES/DRESSINGS) ×2 IMPLANT
SUT ETHILON 3 0 PS 1 (SUTURE) ×2 IMPLANT
SUT MNCRL AB 4-0 PS2 18 (SUTURE) IMPLANT
SUT MON AB 5-0 PS2 18 (SUTURE) IMPLANT
SUT SILK 2 0 SH (SUTURE) ×1 IMPLANT
SUT VIC AB 2-0 SH 27 (SUTURE)
SUT VIC AB 2-0 SH 27XBRD (SUTURE) ×1 IMPLANT
SUT VIC AB 3-0 SH 27 (SUTURE)
SUT VIC AB 3-0 SH 27X BRD (SUTURE) ×1 IMPLANT
SUT VIC AB 5-0 PS2 18 (SUTURE) IMPLANT
SUT VICRYL 3-0 CR8 SH (SUTURE) ×2 IMPLANT
SUT VICRYL AB 3 0 TIES (SUTURE) IMPLANT
SYR CONTROL 10ML LL (SYRINGE) ×3 IMPLANT
TAPE CLOTH SURG 4X10 WHT LF (GAUZE/BANDAGES/DRESSINGS) ×2 IMPLANT
TOWEL GREEN STERILE FF (TOWEL DISPOSABLE) ×3 IMPLANT
TOWEL OR NON WOVEN STRL DISP B (DISPOSABLE) ×3 IMPLANT
TUBE CONNECTING 20'X1/4 (TUBING)
TUBE CONNECTING 20X1/4 (TUBING) IMPLANT
YANKAUER SUCT BULB TIP NO VENT (SUCTIONS) IMPLANT

## 2018-02-20 NOTE — H&P (Signed)
37 yof who has prior reduction presents with left breast cyst since July. this weekly drains and recurs. has been on two rounds of abx with Dr Helane Rima. this is bothersome to her and not getting any better. currently not inflamed but still feels a mass present.    Past Surgical History  Cesarean Section - Multiple  Gallbladder Surgery - Laparoscopic  Mammoplasty; Reduction  Bilateral. Oral Surgery  Resection of Stomach  Spinal Surgery - Lower Back   Diagnostic Studies History  Colonoscopy  never Mammogram  never Pap Smear  1-5 years ago  Allergies  Sulfa Drugs  Codeine/Codeine Derivatives  Allergies Reconciled   Medication History Vitamin D (Ergocalciferol) (50000UNIT Capsule, Oral) Active. metFORMIN HCl ER (750MG Tablet ER 24HR, Oral) Active. Spironolactone (100MG Tablet, Oral) Active. ALPRAZolam (0.5MG Tablet, Oral) Active. Phentermine HCl (37.5MG Capsule, Oral) Active. Medications Reconciled  Social History Alcohol use  Occasional alcohol use. Caffeine use  Coffee. No drug use  Tobacco use  Former smoker.  Family History  Alcohol Abuse  Family Members In General. Arthritis  Family Members In General, Mother. Bleeding disorder  Daughter. Colon Polyps  Father. Depression  Mother. Diabetes Mellitus  Family Members In General. Heart Disease  Family Members In General. Heart disease in female family member before age 8  Hypertension  Mother. Ischemic Bowel Disease  Family Members In General, Mother. Migraine Headache  Family Members In General, Mother. Prostate Cancer  Family Members In General. Respiratory Condition  Family Members In General. Thyroid problems  Mother.  Pregnancy / Birth History Age at menarche  22 years. Gravida  3 Irregular periods  Maternal age  77-20 Para  2  Other Problems  Anxiety Disorder  Arthritis  Back Pain  Cholelithiasis  Depression  Diverticulosis  Hemorrhoids  Lump In  Breast  Migraine Headache    Review of Systems  General Present- Fatigue and Weight Gain. Not Present- Appetite Loss, Chills, Fever, Night Sweats and Weight Loss. Skin Present- Dryness and New Lesions. Not Present- Change in Wart/Mole, Hives, Jaundice, Non-Healing Wounds, Rash and Ulcer. HEENT Present- Seasonal Allergies, Visual Disturbances and Wears glasses/contact lenses. Not Present- Earache, Hearing Loss, Hoarseness, Nose Bleed, Oral Ulcers, Ringing in the Ears, Sinus Pain, Sore Throat and Yellow Eyes. Respiratory Present- Snoring. Not Present- Bloody sputum, Chronic Cough, Difficulty Breathing and Wheezing. Breast Present- Breast Mass and Skin Changes. Not Present- Breast Pain and Nipple Discharge. Cardiovascular Present- Leg Cramps. Not Present- Chest Pain, Difficulty Breathing Lying Down, Palpitations, Rapid Heart Rate, Shortness of Breath and Swelling of Extremities. Gastrointestinal Present- Hemorrhoids. Not Present- Abdominal Pain, Bloating, Bloody Stool, Change in Bowel Habits, Chronic diarrhea, Constipation, Difficulty Swallowing, Excessive gas, Gets full quickly at meals, Indigestion, Nausea, Rectal Pain and Vomiting. Female Genitourinary Not Present- Frequency, Nocturia, Painful Urination, Pelvic Pain and Urgency. Musculoskeletal Present- Back Pain, Joint Pain and Joint Stiffness. Not Present- Muscle Pain, Muscle Weakness and Swelling of Extremities. Neurological Present- Headaches and Tingling. Not Present- Decreased Memory, Fainting, Numbness, Seizures, Tremor, Trouble walking and Weakness. Psychiatric Present- Anxiety, Depression and Frequent crying. Not Present- Bipolar, Change in Sleep Pattern and Fearful. Endocrine Present- Hair Changes and Hot flashes. Not Present- Cold Intolerance, Excessive Hunger, Heat Intolerance and New Diabetes. Hematology Present- Easy Bruising and Excessive bleeding. Not Present- Blood Thinners, Gland problems, HIV and Persistent Infections.  Vitals   Weight: 243.5 lb Height: 63in Body Surface Area: 2.1 m Body Mass Index: 43.13 kg/m  Temp.: 98.43F(Oral)  Pulse: 85 (Regular)  BP: 132/84 (Sitting, Left Arm, Standard) Physical Exam  General Mental Status-Alert. Orientation-Oriented X3. Breast Breast Lump-No Palpable Breast Mass. Note: bilateral reduction scars 1x2 cm subq nodule in lower inner left breast that has skin connection c/w sebaceous cyst cv rrr Lungs clear   Assessment & Plan SEBACEOUS CYST OF BREAST (N60.89) Story: discussed continued observation vs excision. due to persistence and continued issues we have elected to excise this. will plan on doing at day surgery under local mac as it is mildly tender. discussed risks including infection, recurrence

## 2018-02-20 NOTE — Discharge Instructions (Signed)
[facility] Office Phone Number [phone]  POST OP INSTRUCTIONS Take 400 mg of ibuprofen every 8 hours or 650 mg tylenol every 6 hours for next 72 hours then as needed. Use ice several times daily also. Always review your discharge instruction sheet given to you by the facility where your surgery was performed. You may  Change outer dressing as needed.  Pull the packing out of the incision in 48 hours and then just cover. This will continue to drain some.  You may get wet in shower after packing removal.  I will remove sutures in 2 weeks when return to office IF YOU HAVE DISABILITY OR FAMILY LEAVE FORMS, YOU MUST BRING THEM TO THE OFFICE FOR PROCESSING.  DO NOT GIVE THEM TO YOUR DOCTOR.  1. A prescription for pain medication may be given to you upon discharge.  Take your pain medication as prescribed, if needed.  If narcotic pain medicine is not needed, then you may take acetaminophen (Tylenol), naprosyn (Alleve) or ibuprofen (Advil) as needed. 2. Take your usually prescribed medications unless otherwise directed 3. If you need a refill on your pain medication, please contact your pharmacy.  They will contact our office to request authorization.  Prescriptions will not be filled after 5pm or on week-ends. 4. You should eat very light the first 24 hours after surgery, such as soup, crackers, pudding, etc.  Resume your normal diet the day after surgery. 5. Most patients will experience some swelling and bruising in the breast.  Ice packs and a good support bra will help.  Wear the breast binder provided or a sports bra for 72 hours day and night.  After that wear a sports bra during the day until you return to the office. Swelling and bruising can take several days to resolve.  6. It is common to experience some constipation if taking pain medication after surgery.  Increasing fluid intake and taking a stool softener will usually help or prevent this problem from occurring.  A mild  laxative (Milk of Magnesia or Miralax) should be taken according to package directions if there are no bowel movements after 48 hours. 7. Unless discharge instructions indicate otherwise, you may remove your bandages 48 hours after surgery and you may shower at that time.  You may have steri-strips (small skin tapes) in place directly over the incision.  These strips should be left on the skin for 7-10 days and will come off on their own.  If your surgeon used skin glue on the incision, you may shower in 24 hours.  The glue will flake off over the next 2-3 weeks.  Any sutures or staples will be removed at the office during your follow-up visit. 8. ACTIVITIES:  You may resume regular daily activities (gradually increasing) beginning the next day.  Wearing a good support bra or sports bra minimizes pain and swelling.  You may have sexual intercourse when it is comfortable. a. You may drive when you no longer are taking prescription pain medication, you can comfortably wear a seatbelt, and you can safely maneuver your car and apply brakes. b. RETURN TO WORK:  ______________________________________________________________________________________ 9. You should see your doctor in the office for a follow-up appointment approximately two weeks after your surgery.  Your doctors nurse will typically make your follow-up appointment when she calls you with your pathology report.  Expect your pathology report 3-4 business days after your surgery.  You may call to check if you do not hear from [place] after  three days. 10. OTHER INSTRUCTIONS: _______________________________________________________________________________________________  _____________________________________________________________________________________________________________________________________ _____________________________________________________________________________________________________________________________________ _____________________________________________________________________________________________________________________________________  WHEN TO CALL DR [place]: 1. Fever over 101.0 2. Nausea and/or vomiting. 3. Extreme swelling or bruising. 4. Continued bleeding from incision. 5. Increased pain, redness, or drainage from the incision.  The clinic staff is available to answer your questions during regular business hours.  Please dont hesitate to call and ask to speak to one of the nurses for clinical concerns.  If you have a medical emergency, go to the nearest emergency room or call 911.  A surgeon from [facility] is always on call at the hospital.  For further questions, please visit [email or web address]     Post Anesthesia Home Care Instructions  Activity: Get plenty of rest for the remainder of the day. A responsible individual must stay with you for 24 hours following the procedure.  For the next 24 hours, DO NOT: -Drive a car -Advertising copywriter -Drink alcoholic beverages -Take any medication unless instructed by your physician -Make any legal decisions or sign important papers.  Meals: Start with liquid foods such as gelatin or soup. Progress to regular foods as tolerated. Avoid greasy, spicy, heavy foods. If nausea and/or vomiting occur, drink only clear liquids until the nausea and/or vomiting subsides. Call your physician if vomiting continues.  Special Instructions/Symptoms: Your throat may feel dry or sore from the anesthesia or the breathing tube placed in your throat during surgery. If this causes discomfort, gargle with warm salt water. The discomfort should disappear within 24  hours.  If you had a scopolamine patch placed behind your ear for the management of post- operative nausea and/or vomiting:  1. The medication in the patch is effective for 72 hours, after which it should be removed.  Wrap patch in a tissue and discard in the trash. Wash hands thoroughly with soap and water. 2. You may remove the patch earlier than 72 hours if you experience unpleasant side effects which may include dry mouth, dizziness or visual disturbances. 3. Avoid touching the patch. Wash your hands with soap and water after contact with the patch.

## 2018-02-20 NOTE — Transfer of Care (Signed)
Immediate Anesthesia Transfer of Care Note  Patient: Ann [name]  Procedure(s) Performed: EXCISION OF LEFT BREAST SEBACEOUS CYST (Left Breast)  Patient Location: PACU  Anesthesia Type:General  Level of Consciousness: awake, alert , oriented and patient cooperative  Airway & Oxygen Therapy: Patient Spontanous Breathing and Patient connected to face mask oxygen  Post-op Assessment: Report given to RN and Post -op Vital signs reviewed and stable  Post vital signs: Reviewed and stable  Last Vitals:  Vitals Value Taken Time  BP    Temp    Pulse 104 02/20/2018  2:13 PM  Resp 11 02/20/2018  2:13 PM  SpO2 100 % 02/20/2018  2:13 PM  Vitals shown include unvalidated device data.  Last Pain: There were no vitals filed for this visit.       Complications: No apparent anesthesia complications

## 2018-02-20 NOTE — Anesthesia Preprocedure Evaluation (Addendum)
Anesthesia Evaluation  Patient identified by MRN, date of birth, ID band Patient awake    Reviewed: Allergy & Precautions, NPO status , Patient's Chart, lab work & pertinent test results  History of Anesthesia Complications (+) PONV and history of anesthetic complications  Airway Mallampati: II  TM Distance: >3 FB Neck ROM: Full    Dental  (+) Teeth Intact, Dental Advisory Given   Pulmonary asthma , former smoker,    breath sounds clear to auscultation       Cardiovascular negative cardio ROS   Rhythm:Regular Rate:Normal     Neuro/Psych PSYCHIATRIC DISORDERS Anxiety Depression negative neurological ROS     GI/Hepatic negative GI ROS, Neg liver ROS,   Endo/Other  Morbid obesity  Renal/GU negative Renal ROS     Musculoskeletal negative musculoskeletal ROS (+)   Abdominal   Peds  Hematology  (+) Blood dyscrasia, , Von Willebrand's dz   Anesthesia Other Findings   Reproductive/Obstetrics                             Anesthesia Physical  Anesthesia Plan  ASA: III  Anesthesia Plan: General   Post-op Pain Management:    Induction: Intravenous  PONV Risk Score and Plan: 3 and Ondansetron, Dexamethasone and Scopolamine patch - Pre-op  Airway Management Planned: LMA  Additional Equipment:   Intra-op Plan:   Post-operative Plan: Extubation in OR  Informed Consent: I have reviewed the patients History and Physical, chart, labs and discussed the procedure including the risks, benefits and alternatives for the proposed anesthesia with the patient or authorized representative who has indicated his/her understanding and acceptance.   Dental advisory given  Plan Discussed with: CRNA, Anesthesiologist and Surgeon  Anesthesia Plan Comments:       Anesthesia Quick Evaluation

## 2018-02-20 NOTE — Interval H&P Note (Signed)
History and Physical Interval Note:  02/20/2018 1:14 PM  Ann [name]  has presented today for surgery, with the diagnosis of LEFT BREAST SEBACECOUS CYST  The various methods of treatment have been discussed with the patient and family. After consideration of risks, benefits and other options for treatment, the patient has consented to  Procedure(s): EXCISION OF LEFT BREAST SEBACEOUS CYST (Left) as a surgical intervention .  The patient's history has been reviewed, patient examined, no change in status, stable for surgery.  I have reviewed the patient's chart and labs.  Questions were answered to the patient's satisfaction.     Emelia Loron

## 2018-02-20 NOTE — Op Note (Signed)
Preoperative diagnosis: Previously infected left breast sebaceous cyst Postoperative diagnosis: Same as above Procedure: Excision of 2 x 2 centimeter left breast sebaceous cyst Surgeon: Dr. Harden Mo Anesthesia: General with LMA Estimated blood loss: Minimal Complications: None Drains: None Specimens: Left breast skin and soft tissue to pathology Sponge needle count was correct at completion Disposition to recovery in stable condition  Indications: This a 37 year old female who presents with a previously infected left breast sebaceous cyst.  This is still bothersome and flares up intermittently.  There is still a mass present.  We discussed excision of this area as well as observation and she desired excision.  Procedure: After informed consent was obtained the patient was taken to the operating room.  She was administered antibiotics.  SCDs were in place.  She chose to undergo general anesthesia with an LMA.  She was then prepped and draped in the standard sterile surgical fashion.  Surgical timeout was then performed.  We both marked the area previously.  I made an elliptical incision to encompass the 2 areas that went to the skin.  I then used the cautery to remove the sebaceous cyst and the skin overlying it in its entirety.  There was no real active infection.  There was a lot of inflammation present.  I then obtained hemostasis.  I closed the dermis with 3-0 Vicryl after creating some flaps.  I then placed some 3-0 nylon sutures to loosely approximate this and packed some iodoform gauze in it to let it drain.  Dressings were placed.  She tolerated this well and was transferred to recovery room stable.

## 2018-02-20 NOTE — Anesthesia Postprocedure Evaluation (Signed)
Anesthesia Post Note  Patient: Ann [name]  Procedure(s) Performed: EXCISION OF LEFT BREAST SEBACEOUS CYST (Left Breast)     Patient location during evaluation: PACU Anesthesia Type: General Level of consciousness: sedated Pain management: pain level controlled Vital Signs Assessment: post-procedure vital signs reviewed and stable Respiratory status: spontaneous breathing and respiratory function stable Cardiovascular status: stable Postop Assessment: no apparent nausea or vomiting Anesthetic complications: no    Last Vitals:  Vitals:   02/20/18 1445 02/20/18 1540  BP: (!) 108/58 115/70  Pulse: 88 82  Resp: 15 16  Temp:  (!) 36 C  SpO2: 100% 100%    Last Pain:  Vitals:   02/20/18 1540  PainSc: 0-No pain                 [name] DANIEL

## 2018-02-20 NOTE — Anesthesia Procedure Notes (Signed)
Procedure Name: LMA Insertion Date/Time: 02/20/2018 1:35 PM Performed by: Sheryn Bison, CRNA Pre-anesthesia Checklist: Patient identified, Emergency Drugs available, Suction available and Patient being monitored Patient Re-evaluated:Patient Re-evaluated prior to induction Oxygen Delivery Method: Circle system utilized Preoxygenation: Pre-oxygenation with 100% oxygen Induction Type: IV induction Ventilation: Mask ventilation without difficulty LMA: LMA inserted LMA Size: 4.0 Number of attempts: 1 Airway Equipment and Method: Bite block Placement Confirmation: positive ETCO2 Tube secured with: Tape Dental Injury: Teeth and Oropharynx as per pre-operative assessment

## 2018-02-21 ENCOUNTER — Encounter (HOSPITAL_BASED_OUTPATIENT_CLINIC_OR_DEPARTMENT_OTHER): Payer: Self-pay | Admitting: General Surgery

## 2018-12-12 ENCOUNTER — Emergency Department (HOSPITAL_COMMUNITY): Payer: No Typology Code available for payment source

## 2018-12-12 ENCOUNTER — Encounter (HOSPITAL_COMMUNITY): Payer: Self-pay | Admitting: Emergency Medicine

## 2018-12-12 ENCOUNTER — Inpatient Hospital Stay (HOSPITAL_COMMUNITY)
Admission: EM | Admit: 2018-12-12 | Discharge: 2018-12-17 | DRG: 059 | Disposition: A | Discharge status: Home / Self Care | Payer: No Typology Code available for payment source | Attending: Family Medicine | Admitting: Family Medicine

## 2018-12-12 DIAGNOSIS — G4700 Insomnia, unspecified: Secondary | ICD-10-CM | POA: Diagnosis present

## 2018-12-12 DIAGNOSIS — M545 Low back pain: Secondary | ICD-10-CM | POA: Diagnosis present

## 2018-12-12 DIAGNOSIS — R4781 Slurred speech: Secondary | ICD-10-CM | POA: Diagnosis present

## 2018-12-12 DIAGNOSIS — F419 Anxiety disorder, unspecified: Secondary | ICD-10-CM | POA: Diagnosis present

## 2018-12-12 DIAGNOSIS — Z882 Allergy status to sulfonamides status: Secondary | ICD-10-CM

## 2018-12-12 DIAGNOSIS — Z20828 Contact with and (suspected) exposure to other viral communicable diseases: Secondary | ICD-10-CM | POA: Diagnosis present

## 2018-12-12 DIAGNOSIS — R29810 Facial weakness: Secondary | ICD-10-CM | POA: Diagnosis present

## 2018-12-12 DIAGNOSIS — G35 Multiple sclerosis: Secondary | ICD-10-CM | POA: Diagnosis not present

## 2018-12-12 DIAGNOSIS — Z8249 Family history of ischemic heart disease and other diseases of the circulatory system: Secondary | ICD-10-CM

## 2018-12-12 DIAGNOSIS — G8929 Other chronic pain: Secondary | ICD-10-CM | POA: Diagnosis present

## 2018-12-12 DIAGNOSIS — E559 Vitamin D deficiency, unspecified: Secondary | ICD-10-CM | POA: Diagnosis present

## 2018-12-12 DIAGNOSIS — Z8261 Family history of arthritis: Secondary | ICD-10-CM

## 2018-12-12 DIAGNOSIS — Z87891 Personal history of nicotine dependence: Secondary | ICD-10-CM

## 2018-12-12 DIAGNOSIS — R4701 Aphasia: Secondary | ICD-10-CM | POA: Diagnosis not present

## 2018-12-12 DIAGNOSIS — Z818 Family history of other mental and behavioral disorders: Secondary | ICD-10-CM

## 2018-12-12 DIAGNOSIS — D6800 Von Willebrand disease, unspecified: Secondary | ICD-10-CM | POA: Diagnosis present

## 2018-12-12 DIAGNOSIS — R270 Ataxia, unspecified: Secondary | ICD-10-CM | POA: Diagnosis present

## 2018-12-12 DIAGNOSIS — G43909 Migraine, unspecified, not intractable, without status migrainosus: Secondary | ICD-10-CM | POA: Diagnosis present

## 2018-12-12 DIAGNOSIS — Z6841 Body Mass Index (BMI) 40.0 and over, adult: Secondary | ICD-10-CM

## 2018-12-12 DIAGNOSIS — F988 Other specified behavioral and emotional disorders with onset usually occurring in childhood and adolescence: Secondary | ICD-10-CM | POA: Diagnosis present

## 2018-12-12 DIAGNOSIS — Z825 Family history of asthma and other chronic lower respiratory diseases: Secondary | ICD-10-CM

## 2018-12-12 DIAGNOSIS — Z885 Allergy status to narcotic agent status: Secondary | ICD-10-CM

## 2018-12-12 DIAGNOSIS — Z981 Arthrodesis status: Secondary | ICD-10-CM

## 2018-12-12 DIAGNOSIS — Z823 Family history of stroke: Secondary | ICD-10-CM

## 2018-12-12 DIAGNOSIS — R471 Dysarthria and anarthria: Secondary | ICD-10-CM | POA: Diagnosis present

## 2018-12-12 LAB — COMPREHENSIVE METABOLIC PANEL
ALT: 24 U/L (ref 0–44)
AST: 24 U/L (ref 15–41)
Albumin: 4.3 g/dL (ref 3.5–5.0)
Alkaline Phosphatase: 54 U/L (ref 38–126)
Anion gap: 10 (ref 5–15)
BUN: 7 mg/dL (ref 6–20)
CO2: 23 mmol/L (ref 22–32)
Calcium: 9.5 mg/dL (ref 8.9–10.3)
Chloride: 106 mmol/L (ref 98–111)
Creatinine, Ser: 0.87 mg/dL (ref 0.44–1.00)
GFR calc Af Amer: >60 mL/min (ref >60)
GFR calc non Af Amer: >60 mL/min (ref >60)
Glucose, Bld: 94 mg/dL (ref 70–99)
Potassium: 3.7 mmol/L (ref 3.5–5.1)
Sodium: 139 mmol/L (ref 135–145)
Total Bilirubin: 0.6 mg/dL (ref 0.3–1.2)
Total Protein: 7.6 g/dL (ref 6.5–8.1)

## 2018-12-12 LAB — DIFFERENTIAL
Abs Immature Granulocytes: 0.02 10*3/uL (ref 0.00–0.07)
Basophils Absolute: 0 10*3/uL (ref 0.0–0.1)
Basophils Relative: 0 %
Eosinophils Absolute: 0.1 10*3/uL (ref 0.0–0.5)
Eosinophils Relative: 1 %
Immature Granulocytes: 0 %
Lymphocytes Relative: 37 %
Lymphs Abs: 3.3 10*3/uL (ref 0.7–4.0)
Monocytes Absolute: 0.5 10*3/uL (ref 0.1–1.0)
Monocytes Relative: 5 %
Neutro Abs: 5.1 10*3/uL (ref 1.7–7.7)
Neutrophils Relative %: 57 %

## 2018-12-12 LAB — CBC
HCT: 41.6 % (ref 36.0–46.0)
Hemoglobin: 13.4 g/dL (ref 12.0–15.0)
MCH: 31.3 pg (ref 26.0–34.0)
MCHC: 32.2 g/dL (ref 30.0–36.0)
MCV: 97.2 fL (ref 80.0–100.0)
Platelets: 341 10*3/uL (ref 150–400)
RBC: 4.28 MIL/uL (ref 3.87–5.11)
RDW: 12.1 % (ref 11.5–15.5)
WBC: 9.1 10*3/uL (ref 4.0–10.5)
nRBC: 0 % (ref 0.0–0.2)

## 2018-12-12 LAB — I-STAT CHEM 8, ED
BUN: 7 mg/dL (ref 6–20)
Calcium, Ion: 1.21 mmol/L (ref 1.15–1.40)
Chloride: 106 mmol/L (ref 98–111)
Creatinine, Ser: 0.8 mg/dL (ref 0.44–1.00)
Glucose, Bld: 90 mg/dL (ref 70–99)
HCT: 42 % (ref 36.0–46.0)
Hemoglobin: 14.3 g/dL (ref 12.0–15.0)
Potassium: 3.7 mmol/L (ref 3.5–5.1)
Sodium: 141 mmol/L (ref 135–145)
TCO2: 25 mmol/L (ref 22–32)

## 2018-12-12 LAB — PROTIME-INR
INR: 1.1 (ref 0.8–1.2)
Prothrombin Time: 14 seconds (ref 11.4–15.2)

## 2018-12-12 LAB — APTT: aPTT: 33 seconds (ref 24–36)

## 2018-12-12 LAB — I-STAT BETA HCG BLOOD, ED (MC, WL, AP ONLY): I-stat hCG, quantitative: <5 m[IU]/mL (ref <5)

## 2018-12-12 MED ORDER — SODIUM CHLORIDE 0.9% FLUSH: 3.0000 mL | Freq: Once | INTRAVENOUS | Status: DC

## 2018-12-12 NOTE — ED Triage Notes (Signed)
Pt states on Sunday afternoon while she was talking to her children she noticed she had slurred speech and difficultly getting out her words. Pt states over the weekend she also noticed she was also off balance when walking. Pt has no weakness, numbness or drift.

## 2018-12-13 ENCOUNTER — Inpatient Hospital Stay (HOSPITAL_COMMUNITY): Payer: No Typology Code available for payment source

## 2018-12-13 ENCOUNTER — Emergency Department (HOSPITAL_COMMUNITY): Payer: No Typology Code available for payment source

## 2018-12-13 ENCOUNTER — Encounter (HOSPITAL_COMMUNITY): Payer: Self-pay | Admitting: *Deleted

## 2018-12-13 DIAGNOSIS — Z20828 Contact with and (suspected) exposure to other viral communicable diseases: Secondary | ICD-10-CM | POA: Diagnosis present

## 2018-12-13 DIAGNOSIS — R4781 Slurred speech: Secondary | ICD-10-CM | POA: Diagnosis not present

## 2018-12-13 DIAGNOSIS — D680 Von Willebrand's disease: Secondary | ICD-10-CM | POA: Diagnosis present

## 2018-12-13 DIAGNOSIS — Z882 Allergy status to sulfonamides status: Secondary | ICD-10-CM | POA: Diagnosis not present

## 2018-12-13 DIAGNOSIS — G35 Multiple sclerosis: Principal | ICD-10-CM

## 2018-12-13 DIAGNOSIS — R29810 Facial weakness: Secondary | ICD-10-CM | POA: Diagnosis present

## 2018-12-13 DIAGNOSIS — Z87891 Personal history of nicotine dependence: Secondary | ICD-10-CM | POA: Diagnosis not present

## 2018-12-13 DIAGNOSIS — G43909 Migraine, unspecified, not intractable, without status migrainosus: Secondary | ICD-10-CM | POA: Diagnosis present

## 2018-12-13 DIAGNOSIS — Z823 Family history of stroke: Secondary | ICD-10-CM | POA: Diagnosis not present

## 2018-12-13 DIAGNOSIS — R270 Ataxia, unspecified: Secondary | ICD-10-CM | POA: Diagnosis present

## 2018-12-13 DIAGNOSIS — Z885 Allergy status to narcotic agent status: Secondary | ICD-10-CM | POA: Diagnosis not present

## 2018-12-13 DIAGNOSIS — Z981 Arthrodesis status: Secondary | ICD-10-CM | POA: Diagnosis not present

## 2018-12-13 DIAGNOSIS — M545 Low back pain: Secondary | ICD-10-CM | POA: Diagnosis present

## 2018-12-13 DIAGNOSIS — F988 Other specified behavioral and emotional disorders with onset usually occurring in childhood and adolescence: Secondary | ICD-10-CM | POA: Diagnosis present

## 2018-12-13 DIAGNOSIS — F419 Anxiety disorder, unspecified: Secondary | ICD-10-CM | POA: Diagnosis present

## 2018-12-13 DIAGNOSIS — Z8261 Family history of arthritis: Secondary | ICD-10-CM | POA: Diagnosis not present

## 2018-12-13 DIAGNOSIS — Z6841 Body Mass Index (BMI) 40.0 and over, adult: Secondary | ICD-10-CM | POA: Diagnosis not present

## 2018-12-13 DIAGNOSIS — G4700 Insomnia, unspecified: Secondary | ICD-10-CM | POA: Diagnosis present

## 2018-12-13 DIAGNOSIS — Z8249 Family history of ischemic heart disease and other diseases of the circulatory system: Secondary | ICD-10-CM | POA: Diagnosis not present

## 2018-12-13 DIAGNOSIS — G8929 Other chronic pain: Secondary | ICD-10-CM | POA: Diagnosis present

## 2018-12-13 DIAGNOSIS — Z818 Family history of other mental and behavioral disorders: Secondary | ICD-10-CM | POA: Diagnosis not present

## 2018-12-13 DIAGNOSIS — E559 Vitamin D deficiency, unspecified: Secondary | ICD-10-CM | POA: Diagnosis present

## 2018-12-13 DIAGNOSIS — R4701 Aphasia: Secondary | ICD-10-CM | POA: Diagnosis present

## 2018-12-13 DIAGNOSIS — Z825 Family history of asthma and other chronic lower respiratory diseases: Secondary | ICD-10-CM | POA: Diagnosis not present

## 2018-12-13 DIAGNOSIS — R471 Dysarthria and anarthria: Secondary | ICD-10-CM | POA: Diagnosis present

## 2018-12-13 LAB — RAPID URINE DRUG SCREEN, HOSP PERFORMED
Amphetamines: NOT DETECTED
Barbiturates: NOT DETECTED
Benzodiazepines: POSITIVE — AB
Cocaine: NOT DETECTED
Opiates: NOT DETECTED
Tetrahydrocannabinol: POSITIVE — AB

## 2018-12-13 LAB — TSH: TSH: 0.979 u[IU]/mL (ref 0.350–4.500)

## 2018-12-13 LAB — SARS CORONAVIRUS 2 (TAT 6-24 HRS): SARS Coronavirus 2: NEGATIVE

## 2018-12-13 MED ORDER — LORAZEPAM 2 MG/ML IJ SOLN
1.0000 mg | Freq: Once | INTRAMUSCULAR | Status: AC | PRN
  Administered 2018-12-13: 1 mg via INTRAVENOUS
  Filled 2018-12-13: qty 1

## 2018-12-13 MED ORDER — SODIUM CHLORIDE 0.9 % IV SOLN
1000.0000 mg | Freq: Every day | INTRAVENOUS | Status: AC
  Administered 2018-12-13 – 2018-12-15 (×3): 1000 mg via INTRAVENOUS
  Filled 2018-12-13 (×3): qty 8

## 2018-12-13 MED ORDER — SODIUM CHLORIDE 0.9 % IV BOLUS
500.0000 mL | Freq: Once | INTRAVENOUS | Status: AC
  Administered 2018-12-13: 500 mL via INTRAVENOUS

## 2018-12-13 MED ORDER — ACETAMINOPHEN 325 MG PO TABS
650.0000 mg | ORAL_TABLET | ORAL | Status: DC | PRN
  Administered 2018-12-13 – 2018-12-15 (×3): 650 mg via ORAL
  Filled 2018-12-13 (×4): qty 2

## 2018-12-13 MED ORDER — PROCHLORPERAZINE EDISYLATE 10 MG/2ML IJ SOLN
10.0000 mg | Freq: Once | INTRAMUSCULAR | Status: AC
  Administered 2018-12-13: 10 mg via INTRAVENOUS
  Filled 2018-12-13: qty 2

## 2018-12-13 MED ORDER — DIPHENHYDRAMINE HCL 50 MG/ML IJ SOLN
25.0000 mg | Freq: Once | INTRAMUSCULAR | Status: AC
  Administered 2018-12-13: 25 mg via INTRAVENOUS
  Filled 2018-12-13: qty 1

## 2018-12-13 MED ORDER — ACETAMINOPHEN 650 MG RE SUPP: 650.0000 mg | RECTAL | Status: DC | PRN

## 2018-12-13 MED ORDER — ACETAMINOPHEN 160 MG/5ML PO SOLN: 650.0000 mg | ORAL | Status: DC | PRN

## 2018-12-13 MED ORDER — PANTOPRAZOLE SODIUM 40 MG IV SOLR
40.0000 mg | INTRAVENOUS | Status: DC
  Administered 2018-12-13 – 2018-12-15 (×3): 40 mg via INTRAVENOUS
  Filled 2018-12-13 (×3): qty 40

## 2018-12-13 MED ORDER — GADOBUTROL 1 MMOL/ML IV SOLN
8.0000 mL | Freq: Once | INTRAVENOUS | Status: AC | PRN
  Administered 2018-12-13: 8 mL via INTRAVENOUS

## 2018-12-13 MED ORDER — PROCHLORPERAZINE 25 MG RE SUPP
25.0000 mg | Freq: Once | RECTAL | Status: DC
  Filled 2018-12-13: qty 1

## 2018-12-13 MED ORDER — SODIUM CHLORIDE 0.9 % IV SOLN
INTRAVENOUS | Status: DC
  Administered 2018-12-13: 11:00:00 via INTRAVENOUS

## 2018-12-13 MED ORDER — ENOXAPARIN SODIUM 40 MG/0.4ML ~~LOC~~ SOLN
40.0000 mg | SUBCUTANEOUS | Status: DC
  Administered 2018-12-13 – 2018-12-17 (×5): 40 mg via SUBCUTANEOUS
  Filled 2018-12-13 (×5): qty 0.4

## 2018-12-13 MED ORDER — ALPRAZOLAM 0.25 MG PO TABS
0.5000 mg | ORAL_TABLET | Freq: Two times a day (BID) | ORAL | Status: DC | PRN
  Administered 2018-12-13 – 2018-12-15 (×3): 0.5 mg via ORAL
  Filled 2018-12-13 (×3): qty 2

## 2018-12-13 NOTE — ED Notes (Signed)
Pt ambulated to restroom without distress. Neuro intact. States "its a lot to take in" when asked how she is. Report given to floor.

## 2018-12-13 NOTE — H&P (Addendum)
History and Physical    Ann [name] ZOX:[id number] DOB: 01/28/1981 DOA: 12/12/2018  PCP: Courtney ParisMcCoy, Rachel, NP Consultants:  None Patient coming from:  Home - lives with husband and 3 children ([phone]); NOK: Husband, [phone]; Mother, [phone]  Chief Complaint: Slurred speech  HPI: Ann [name] is a 38 y.o. female with medical history significant of Von Willebrand disease; and depression/anxiety presenting with slurred speech.  On Sunday, she noticed her speech was slurred.  She stumbled some.  The speech worsened Monday and Tuesday.  No visual disturbance.  No N/W/T of arms or legs.  No facial droop.  Right before she came here, her husband noticed her face was drooping a little.  No history of neurologic problems prior.     ED Course:  Slurred speech since 8/3.  Yesterday, she had difficulty signing her signature.  Also more clumsy and dropping things with R hand.  Headache, given migraine cocktail.  MRI concerning for MS.  Seen by neurology - MRI brain and C-spine ordered.  COVID pending.  Review of Systems: As per HPI; otherwise review of systems reviewed and negative.   Ambulatory Status:  Ambulates without assistance  Past Medical History:  Diagnosis Date  . ADD (attention deficit disorder)    takes Adderall daily  . Anemia   . Anxiety    takes Xanax daily as needed  . Asthma    mild case per pt  . Chronic lower back pain   . Depression    has meds prescribed but doesn't take them  . Family history of adverse reaction to anesthesia    "Mom gets PONV too"  . Headache    "monthly" (02/27/2015)  . Hemorrhoids   . History of esophagogastroduodenoscopy (EGD)   . Migraine    "none in the last 6 months" (02/27/2015)  . Nocturia   . Pneumonia "several times"  . PONV (postoperative nausea and vomiting)   . Von Willebrand disease (HCC)    Dr. Cyndie ChimeGranfortuna    Past Surgical History:  Procedure Laterality Date  . BACK SURGERY    . BREAST CYST EXCISION  Left 02/20/2018   Procedure: EXCISION OF LEFT BREAST SEBACEOUS CYST;  Surgeon: Emelia LoronWakefield, Matthew, MD;  Location: [facility];  Service: General;  Laterality: Left;  . BREAST REDUCTION SURGERY Bilateral 02/27/2015   Procedure: BILATERAL BREAST REDUCTION WITH FREE NIPPLE GRAFT TECHNIQUE ON RIGHT BREAST;  Surgeon: Etter Sjogrenavid Bowers, MD;  [facility];  Service: Plastics;  Laterality: Bilateral;  . CESAREAN SECTION  2004; 2013  . CHOLECYSTECTOMY  03/02/2012   Procedure: LAPAROSCOPIC CHOLECYSTECTOMY WITH INTRAOPERATIVE CHOLANGIOGRAM;  Surgeon: Currie Parishristian J Streck, MD;  Location: [facility] OR;  Service: General;  Laterality: N/A;  . DILATION AND CURETTAGE OF UTERUS  ~ 1997; ~ 2005  . DILITATION & CURRETTAGE/HYSTROSCOPY WITH THERMACHOICE ABLATION  04/28/2012   Procedure: DILATATION & CURETTAGE/HYSTEROSCOPY WITH THERMACHOICE ABLATION;  Surgeon: Jeani HawkingMichelle L Grewal, MD;  Location: [facility] ORS;  Service: Gynecology;  Laterality: N/A;  . ERCP  03/03/2012   Procedure: ENDOSCOPIC RETROGRADE CHOLANGIOPANCREATOGRAPHY (ERCP);  Surgeon: Theda BelfastPatrick D Hung, MD;  [facility];  Service: Endoscopy;  Laterality: N/A;  dl/hung  . LAPAROSCOPY  2011  . LUMBAR DISC SURGERY  2003; 2008; 2009  . MULTIPLE TOOTH EXTRACTIONS  "scattered dates"  . POSTERIOR LUMBAR FUSION  2010  . REDUCTION MAMMAPLASTY Bilateral 02/27/2015  . TUBAL LIGATION  2013  . WISDOM TOOTH EXTRACTION  2001    Social History   Socioeconomic History  .  Marital status: Married    Spouse name: Not on file  . Number of children: Not on file  . Years of education: Not on file  . Highest education level: Not on file  Occupational History  . Occupation: regulatory affairs  Social Needs  . Financial resource strain: Not on file  . Food insecurity    Worry: Not on file    Inability: Not on file  . Transportation needs    Medical: Not on file    Non-medical: Not on file  Tobacco Use  . Smoking status: Former Smoker    Packs/day: 0.25     Years: 2.00    Pack years: 0.50    Types: Cigarettes    Quit date: 2000    Years since quitting: 20.6  . Smokeless tobacco: Never Used  Substance and Sexual Activity  . Alcohol use: Yes    Alcohol/week: 0.0 standard drinks    Comment: social  . Drug use: Yes    Types: Marijuana    Comment: occasional use, last use last week  . Sexual activity: Yes    Birth control/protection: None    Comment: BTL  Lifestyle  . Physical activity    Days per week: Not on file    Minutes per session: Not on file  . Stress: Not on file  Relationships  . Social Musicianconnections    Talks on phone: Not on file    Gets together: Not on file    Attends religious service: Not on file    Active member of club or organization: Not on file    Attends meetings of clubs or organizations: Not on file    Relationship status: Not on file  . Intimate partner violence    Fear of current or ex partner: Not on file    Emotionally abused: Not on file    Physically abused: Not on file    Forced sexual activity: Not on file  Other Topics Concern  . Not on file  Social History Narrative  . Not on file    Allergies  Allergen Reactions  . Darvocet [Propoxyphene N-Acetaminophen] Nausea And Vomiting  . Codeine Nausea And Vomiting and Rash  . Sulfa Antibiotics Rash    Family History  Problem Relation Age of Onset  . Stroke Mother   . Arthritis Mother   . Depression Mother   . COPD Father   . Kidney disease Paternal Uncle   . Stroke Maternal Grandfather   . Heart disease Paternal Grandfather   . Multiple sclerosis Neg Hx     Prior to Admission medications   Medication Sig Start Date End Date Taking? Authorizing Provider  ALPRAZolam Prudy Feeler(XANAX) 1 MG tablet Take 0.5 mg by mouth 2 (two) times daily as needed for anxiety.    Yes [provider]  etodolac (LODINE XL) 400 MG 24 hr tablet Take 400 mg by mouth 2 (two) times daily as needed for pain. 10/31/18  Yes [provider]  ibuprofen  (ADVIL,MOTRIN) 200 MG tablet Take 200 mg by mouth every 6 (six) hours as needed for fever.   Yes [provider]  meclizine (ANTIVERT) 25 MG tablet Take 25 mg by mouth 3 (three) times daily as needed for dizziness.   Yes [provider]    Physical Exam: Vitals:   12/13/18 0830 12/13/18 0845 12/13/18 0855 12/13/18 0914  BP: (!) 136/97 121/87  (!) 135/97  Pulse: 82 80    Resp:    12  Temp:  98.2 F (36.8 C)  TempSrc:    Oral  SpO2: 98% 97% 99% 99%     . General:  Appears calm and comfortable and is NAD with right-sided facial fullness and droop evident . Eyes:  PERRL, EOMI, normal lids, iris . ENT:  grossly normal hearing, lips & tongue, mmm; appropriate dentition . Neck:  no LAD, masses or thyromegaly . Cardiovascular:  RRR, no m/r/g. No LE edema.  Marland Kitchen Respiratory:   CTA bilaterally with no wheezes/rales/rhonchi.  Normal respiratory effort. . Abdomen:  soft, NT, ND, NABS . Skin:  no rash or induration seen on limited exam . Musculoskeletal:  grossly normal tone BUE/BLE, good ROM, no bony abnormality . Psychiatric:  grossly normal mood and affect, speech fluent and appropriate with mild dysarthria, AOx3 . Neurologic:  R facial droop, moves all extremities in coordinated fashion, sensation intact    Radiological Exams on Admission: Ct Head Wo Contrast  Result Date: 12/12/2018 CLINICAL DATA:  Episodes of slurred speech, loss of balance. EXAM: CT HEAD WITHOUT CONTRAST TECHNIQUE: Contiguous axial images were obtained from the base of the skull through the vertex without intravenous contrast. COMPARISON:  May 05, 2014 FINDINGS: Brain: No evidence of acute infarction, hemorrhage, hydrocephalus, extra-axial collection or mass lesion/mass effect. Vascular: No hyperdense vessel or unexpected calcification. Skull: Normal. Negative for fracture or focal lesion. Sinuses/Orbits: No acute finding. Other: None. IMPRESSION: No acute intracranial abnormality. Electronically  Signed   By: Fidela Salisbury M.D.   On: 12/12/2018 19:08   Mr Brain Wo Contrast  Result Date: 12/13/2018 CLINICAL DATA:  Subacute neuro deficit. Slurred speech and difficulty with balance EXAM: MRI HEAD WITHOUT CONTRAST TECHNIQUE: Multiplanar, multiecho pulse sequences of the brain and surrounding structures were obtained without intravenous contrast. COMPARISON:  Head CT from yesterday FINDINGS: Brain: Ovoid area of weakly restricted diffusion in the left corona radiata measuring 12 mm. There are ovoid periventricular FLAIR hyperintensities in the cerebral white matter. No definite juxta cortical or infratentorial signal abnormality. No hemorrhage, hydrocephalus, or masslike finding. Normal brain volume Vascular: Major flow voids are preserved Skull and upper cervical spine: Negative for marrow lesion Sinuses/Orbits: Negative IMPRESSION: Suspect multiple sclerosis. A signal abnormality in the left corona radiata shows weak diffusion restriction favoring active demyelination. The main differential would be premature chronic small vessel ischemia with subacute infarct. Electronically Signed   By: Monte Fantasia M.D.   On: 12/13/2018 06:13    EKG: Independently reviewed.  NSR with rate 83; no evidence of acute ischemia   Labs on Admission: I have personally reviewed the available labs and imaging studies at the time of the admission.  Pertinent labs:   CMP WNL Normal CBC INR 1.1 HCG < 5.0   Assessment/Plan Principal Problem:   Slurred speech Active Problems:   Von Willebrand disease - hx PPH   Morbid obesity (HCC)   Anxiety   Slurred speech -Patient without prior report of neurologic complaints or history presenting with subacute slurred speech, ataxia -MRI shows what appears to be MS, although subacute CVA is also a consideration -Will order NMO (Neuromyelitis optica autoab, IgG)N -Will admit to telemetry while still ruling out for CVA -Will order MRI with contrast head and neck  -Will treat with 1 gram Solumedrol IV daily x 5 days -Needs empiric PPI while on steroids -Neurology consultation is appreciated -Physical/occupational/speech therapy consults.   -Will need to consider lumbar puncture with Cell count with diff, gram stain+culture, protein, glucose, oligoclonal bands, IgG index after imaging has been performed.  May need to consult to IR for LP if neurology is unable to perform -Risk stratification with FLP, A1c; will also check TSH and UDS -ASA daily  Von Willebrand disease -No bleeding appreciated on CT -Rarely followed for this condition and not on medications for it -Patient discussed with pharmacy - given her morbid obesity, her DVT risk is likely high enough to justify Lovenox -Will need to be closely monitored for bleeding  Morbid obesity -BMI is 44 -Suggest outpatient bariatric medicine and/or surgery referral as an outpatient  Anxiety -Continue prn Xanax -May benefit from outpatient psych referral to help cope with diagnosis   Note: This patient has been tested and is pending for the novel coronavirus COVID-19.  DVT prophylaxis:  Lovenox  Code Status:  Full  Family Communication: None present; she requested that I call to speak with her mother (not her husband) and I have called to discuss it with her Disposition Plan:  Home once clinically improved Consults called: Neurology  Admission status: Admit - It is my clinical opinion that admission to INPATIENT is reasonable and necessary because of the expectation that this patient will require hospital care that crosses at least 2 midnights to treat this condition based on the medical complexity of the problems presented.  Given the aforementioned information, the predictability of an adverse outcome is felt to be significant.    Jonah BlueJennifer Cecillia Menees MD [facility]   How to contact the [facility] Attending or Consulting provider 7A - 7P or covering provider during after hours 7P -7A, for this  patient?  1. Check the care team in [facility] and look for a) attending/consulting [facility] provider listed and b) the [facility] team listed 2. Log into [email or web address] and use [place]'s universal password to access. If you do not have the password, please contact the hospital operator. 3. Locate the [facility] provider you are looking for under [facility] and page to a number that you can be directly reached. 4. If you still have difficulty reaching the provider, please page the [facility] (Director on Call) for the Hospitalists listed on amion for assistance.   12/13/2018, 11:23 AM

## 2018-12-13 NOTE — Progress Notes (Signed)
NEUROLOGY PROGRESS NOTE  Subjective: Patient still states that she has dysarthria, also facial droop.  She states she has no other symptoms.  She has not received any methylprednisolone as of yet.  Exam: Vitals:   12/13/18 0845 12/13/18 0914  BP: 121/87 (!) 135/97  Pulse: 80   Resp:  12  Temp:  98.2 F (36.8 C)  SpO2: 97% 99%    Physical Exam   HEENT-  Normocephalic, no lesions, without obvious abnormality.  Normal external eye and conjunctiva.   Extremities- Warm, dry and intact Musculoskeletal-no joint tenderness, deformity or swelling Skin-warm and dry, no hyperpigmentation, vitiligo, or suspicious lesions    Neuro:  Mental Status: Alert, oriented, thought content appropriate.  Speech mild dysarthria without evidence of aphasia.  Able to follow 3 step commands without difficulty. Cranial Nerves: II:  Visual fields grossly normal,  III,IV, VI: ptosis not present, extra-ocular motions intact bilaterally pupils equal, round, reactive to light and accommodation V,VII: Right facial droop, facial light touch sensation normal bilaterally VIII: hearing normal bilaterally IX,X: Palate rises midline XI: bilateral shoulder shrug XII: midline tongue extension Motor: Right : Upper extremity   5/5    Left:     Upper extremity   5/5  Lower extremity   5/5     Lower extremity   5/5 Tone and bulk:normal tone throughout; no atrophy noted Sensory: Pinprick and light touch intact throughout, bilaterally Deep Tendon Reflexes: 2+ and symmetric throughout Plantars: Right: downgoing   Left: downgoing Cerebellar: normal finger-to-nose, normal rapid alternating movements and normal heel-to-shin test     Medications:  Scheduled: . enoxaparin (LOVENOX) injection  40 mg Subcutaneous Q24H  . pantoprazole (PROTONIX) IV  40 mg Intravenous Q24H   Continuous: . sodium chloride    . methylPREDNISolone (SOLU-MEDROL) injection     QIW:[id number] **OR** acetaminophen (TYLENOL) oral liquid  160 mg/5 mL **OR** acetaminophen, ALPRAZolam  Pertinent Labs/Diagnostics:   Ct Head Wo Contrast  Result Date: 12/12/2018 CLINICAL DATA:  Episodes of slurred speech, loss of balance. EXAM: CT HEAD WITHOUT CONTRAST TECHNIQUE: Contiguous axial images were obtained from the base of the skull through the vertex without intravenous contrast. COMPARISON:  May 05, 2014 FINDINGS: Brain: No evidence of acute infarction, hemorrhage, hydrocephalus, extra-axial collection or mass lesion/mass effect. Vascular: No hyperdense vessel or unexpected calcification. Skull: Normal. Negative for fracture or focal lesion. Sinuses/Orbits: No acute finding. Other: None. IMPRESSION: No acute intracranial abnormality. Electronically Signed   By: Ted Mcalpine M.D.   On: 12/12/2018 19:08   Mr Brain Wo Contrast  Result Date: 12/13/2018 CLINICAL DATA:  Subacute neuro deficit. Slurred speech and difficulty with balance EXAM: MRI HEAD WITHOUT CONTRAST TECHNIQUE: Multiplanar, multiecho pulse sequences of the brain and surrounding structures were obtained without intravenous contrast. COMPARISON:  Head CT from yesterday FINDINGS: Brain: Ovoid area of weakly restricted diffusion in the left corona radiata measuring 12 mm. There are ovoid periventricular FLAIR hyperintensities in the cerebral white matter. No definite juxta cortical or infratentorial signal abnormality. No hemorrhage, hydrocephalus, or masslike finding. Normal brain volume Vascular: Major flow voids are preserved Skull and upper cervical spine: Negative for marrow lesion Sinuses/Orbits: Negative IMPRESSION: Suspect multiple sclerosis. A signal abnormality in the left corona radiata shows weak diffusion restriction favoring active demyelination. The main differential would be premature chronic small vessel ischemia with subacute infarct. Electronically Signed   By: Marnee Spring M.D.   On: 12/13/2018 06:13     Felicie Morn PA-C [facility] [phone]   Assessment: 38 year old  female presenting with new onset dysarthria and right facial droop.  MRI suggestive of MS.    Recommendations: -MRI brain with contrast -MRI of cervical spine without and with contrast - Methylprednisolone 1 g daily x3 days -Protonix 40 mg daily for gut protection -Check vitamin D level   12/13/2018, 10:47 AM

## 2018-12-13 NOTE — Consult Note (Signed)
Requesting Physician: Frederik PearMia McDonald, PA-C    Chief Complaint: Slurred speech, clumsiness  History obtained from: Patient and Chart    HPI:                                                                                                                                       Ann [name] is a 38 y.o. female with past medical history of von Willebrand's disease, migraines, vertigo, anxiety presents to the emergency department with slurred speech and clumsiness since Saturday.  Patient states that she first noticed slurred speech on Sunday around 8 PM.  She went to pick up her kids at her parents house when her daughter noticed that she was not talking correctly.  Patient noted she had slurred speech and difficulty pronouncing words.  Denies any difficulty with language.  She also wonders if her symptoms actually began on Friday/Saturday.  On Saturday she was more clumsier than her normal self.  Had difficulty writing and was dropping things.  She also had an episode of vertigo and took some meclizine.  She has a history of migraines, that comes on once and every 6 months usually lasting for 2 to 3 days associated with photophobia and nausea.  However this time she denies any headache apart from some mild headache she experienced today.  She does not have aura symptoms with her migraine.  She was diagnosed to have von Willebrand's disease during her pregnancy due to excessive bleeding.  She has been asymptomatic since then and was supposed to follow-up with a hematologist.   Date last known well: 8.1.20 tPA Given: No, outside window for TPA   Past Medical History:  Diagnosis Date  . ADD (attention deficit disorder)    takes Adderall daily  . Anemia   . Anxiety    takes Xanax daily as needed  . Asthma    mild case per pt  . Blood dyscrasia    last seen when diagnosed with Von Willebrands by Dr Cyndie ChimeGranfortuna 972yrs ago  . Chronic lower back pain   . Depression    has meds prescribed but  doesn't take them  . Family history of adverse reaction to anesthesia    "Mom gets PONV too"  . Headache    "monthly" (02/27/2015)  . Hemorrhoids   . History of esophagogastroduodenoscopy (EGD)   . Migraine    "none in the last 6 months" (02/27/2015)  . Nocturia   . Pneumonia "several times"  . PONV (postoperative nausea and vomiting)   . Von Willebrand disease (HCC)     Past Surgical History:  Procedure Laterality Date  . BACK SURGERY    . BREAST CYST EXCISION Left 02/20/2018   Procedure: EXCISION OF LEFT BREAST SEBACEOUS CYST;  Surgeon: Emelia LoronWakefield, Matthew, MD;  Location: [facility];  Service: General;  Laterality: Left;  . BREAST REDUCTION SURGERY Bilateral 02/27/2015   Procedure: BILATERAL BREAST  REDUCTION WITH FREE NIPPLE GRAFT TECHNIQUE ON RIGHT BREAST;  Surgeon: Etter Sjogren, MD;  Location: [facility] OR;  Service: Plastics;  Laterality: Bilateral;  . CESAREAN SECTION  2004; 2013  . CHOLECYSTECTOMY  03/02/2012   Procedure: LAPAROSCOPIC CHOLECYSTECTOMY WITH INTRAOPERATIVE CHOLANGIOGRAM;  Surgeon: Currie Paris, MD;  Location: [facility] OR;  Service: General;  Laterality: N/A;  . DILATION AND CURETTAGE OF UTERUS  ~ 1997; ~ 2005  . DILITATION & CURRETTAGE/HYSTROSCOPY WITH THERMACHOICE ABLATION  04/28/2012   Procedure: DILATATION & CURETTAGE/HYSTEROSCOPY WITH THERMACHOICE ABLATION;  Surgeon: Jeani Hawking, MD;  Location: [facility]S;  Service: Gynecology;  Laterality: N/A;  . ERCP  03/03/2012   Procedure: ENDOSCOPIC RETROGRADE CHOLANGIOPANCREATOGRAPHY (ERCP);  Surgeon: Theda Belfast, MD;  Location: [facility] ENDOSCOPY;  Service: Endoscopy;  Laterality: N/A;  dl/hung  . LAPAROSCOPY  2011  . LUMBAR DISC SURGERY  2003; 2008; 2009  . MULTIPLE TOOTH EXTRACTIONS  "scattered dates"  . POSTERIOR LUMBAR FUSION  2010  . REDUCTION MAMMAPLASTY Bilateral 02/27/2015  . TUBAL LIGATION  2013  . WISDOM TOOTH EXTRACTION  2001    Family History  Problem Relation Age of Onset  . Stroke Mother    . Arthritis Mother   . Depression Mother   . COPD Father   . Kidney disease Paternal Uncle   . Stroke Maternal Grandfather   . Heart disease Paternal Grandfather    Social History:  reports that she has quit smoking. Her smoking use included cigarettes. She has a 0.50 pack-year smoking history. She has never used smokeless tobacco. She reports current alcohol use. She reports that she does not use drugs.  Allergies:  Allergies  Allergen Reactions  . Darvocet [Propoxyphene N-Acetaminophen] Nausea And Vomiting  . Codeine Nausea And Vomiting and Rash  . Sulfa Antibiotics Rash    Medications:                                                                                                                        I reviewed home medications   ROS:                                                                                                                                     14 systems reviewed and negative except above    Examination:  General: Appears well-developed  Psych: Affect appropriate to situation Eyes: No scleral injection HENT: No OP obstrucion Head: Normocephalic.  Cardiovascular: Normal rate and regular rhythm. Respiratory: Effort normal and breath sounds normal to anterior ascultation GI: Soft.  No distension. There is no tenderness.  Skin: WDI    Neurological Examination Mental Status: Alert, oriented, thought content appropriate.  Speech fluent without evidence of aphasia.  Mild dysarthria.  Able to follow 3 step commands without difficulty. Cranial Nerves: II: Visual fields grossly normal,  III,IV, VI: ptosis not present, extra-ocular motions intact bilaterally, pupils equal, round, reactive to light and accommodation V,VII: Right facial droop. facial light touch sensation normal bilaterally VIII: hearing normal bilaterally IX,X: uvula rises  symmetrically XI: bilateral shoulder shrug XII: midline tongue extension Motor: Right : Upper extremity   5/5    Left:     Upper extremity   5/5  Lower extremity   5/5     Lower extremity   5/5 Tone and bulk:normal tone throughout; no atrophy noted Sensory: Pinprick and light touch intact throughout, bilaterally Deep Tendon Reflexes: 2+ and symmetric throughout Plantars: Right: downgoing   Left: downgoing Cerebellar: normal finger-to-nose, normal rapid alternating movements Gait: normal gait and station     Lab Results: Basic Metabolic Panel: Recent Labs  Lab 12/12/18 1739 12/12/18 1809  NA 139 141  K 3.7 3.7  CL 106 106  CO2 23  --   GLUCOSE 94 90  BUN 7 7  CREATININE 0.87 0.80  CALCIUM 9.5  --     CBC: Recent Labs  Lab 12/12/18 1739 12/12/18 1809  WBC 9.1  --   NEUTROABS 5.1  --   HGB 13.4 14.3  HCT 41.6 42.0  MCV 97.2  --   PLT 341  --     Coagulation Studies: Recent Labs    12/12/18 1739  LABPROT 14.0  INR 1.1    Imaging: Ct Head Wo Contrast  Result Date: 12/12/2018 CLINICAL DATA:  Episodes of slurred speech, loss of balance. EXAM: CT HEAD WITHOUT CONTRAST TECHNIQUE: Contiguous axial images were obtained from the base of the skull through the vertex without intravenous contrast. COMPARISON:  May 05, 2014 FINDINGS: Brain: No evidence of acute infarction, hemorrhage, hydrocephalus, extra-axial collection or mass lesion/mass effect. Vascular: No hyperdense vessel or unexpected calcification. Skull: Normal. Negative for fracture or focal lesion. Sinuses/Orbits: No acute finding. Other: None. IMPRESSION: No acute intracranial abnormality. Electronically Signed   By: Fidela Salisbury M.D.   On: 12/12/2018 19:08     ASSESSMENT AND PLAN  38 year old female presenting with new onset dysarthria and clumsiness.  MRI brain suggestive of MS.   New diagnosis of multiple sclerosis -Based on multiple periventricular lesions on MRI brain and clinical  event  Recommendations -Obtain MRI brain with contrast and sagittal FLAIR -MRI C-spine with and without contrast -High-dose methylprednisolone 1 g into 3 to 5 days - Protonix 40mg  daily  -Check Vitamin D   Josel Keo [facility] Pager Number [phone]

## 2018-12-13 NOTE — ED Provider Notes (Signed)
[facility] EMERGENCY DEPARTMENT Provider Note   CSN: [id number] Arrival date & time: 12/12/18  1656    History   Chief Complaint Chief Complaint  Patient presents with  . Aphasia    HPI Ann [name] is a 38 y.o. female history of von Willebrand disease, anxiety, ADD, and vertigo who presents to the emergency department with a chief complaint of aphasia.  The patient reports she has been having changes to her speech since Sunday, 8/2.  She reports that her family has noticed that her speech sounded slurred.  She also reports that she had to make a couple of phone calls at work earlier today and reports that she difficulty formulating the words she was trying to say.  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  She is right-hand dominant.  She reports that she has been dropping objects out of her hand more frequently over the last few days.   She developed dizziness last night and took a tablet of meclizine from a previous vertigo prescription with no improvement in her symptoms.  Dizziness had resolved when she awoke this morning, but she reports that since she has been waiting in the ER that the dizziness is returned and she is feeling nauseated and has developed a headache.   She reports that she became more concerned today after her husband noticed that she appeared to be staggering while walking.  She reports that she has been feeling more off balance with ambulation.  She denies loss of vision, visual changes, fever, chills, numbness, left-sided weakness, loss of smell or taste, neck pain, or urinary or fecal incontinence.  She reports that she was diagnosed many years ago for von Willebrand's disease.  She is not currently established with a hematologist.     The history is provided by the patient. No language interpreter was used.    Past Medical  History:  Diagnosis Date  . ADD (attention deficit disorder)    takes Adderall daily  . Anemia   . Anxiety    takes Xanax daily as needed  . Asthma    mild case per pt  . Blood dyscrasia    last seen when diagnosed with Von Willebrands by Dr Beryle Beams 3yrs ago  . Chronic lower back pain   . Depression    has meds prescribed but doesn't take them  . Family history of adverse reaction to anesthesia    "Mom gets PONV too"  . Headache    "monthly" (02/27/2015)  . Hemorrhoids   . History of esophagogastroduodenoscopy (EGD)   . Migraine    "none in the last 6 months" (02/27/2015)  . Nocturia   . Pneumonia "several times"  . PONV (postoperative nausea and vomiting)   . Von Willebrand disease [facility])     Patient Active Problem List   Diagnosis Date Noted  . Slurred speech 12/13/2018  . Breast hypertrophy in female 02/27/2015  . Hyponatremia 03/04/2012  . Acute cholecystitis 03/02/2012  . Morbid obesity ([place]) 08/25/2011  . Von Willebrand disease - hx PPH 04/14/2011  . History of abnormal Pap smear - had colpo 04/14/2011  . Infertility, female 04/14/2011  . HSV-2 infection - history, no outbreaks 04/14/2011  . Anemia 04/14/2011  . Asthma - uses inhaler PRN 04/14/2011  . History of pyelonephritis - frequent history as a child 04/14/2011  . Migraine 04/14/2011  .  History of depression 04/14/2011  . History of smoking 04/14/2011    Past Surgical History:  Procedure Laterality Date  . BACK SURGERY    . BREAST CYST EXCISION Left 02/20/2018   Procedure: EXCISION OF LEFT BREAST SEBACEOUS CYST;  Surgeon: Emelia Loron, MD;  Location: [facility];  Service: General;  Laterality: Left;  . BREAST REDUCTION SURGERY Bilateral 02/27/2015   Procedure: BILATERAL BREAST REDUCTION WITH FREE NIPPLE GRAFT TECHNIQUE ON RIGHT BREAST;  Surgeon: Etter Sjogren, MD;  Location: [facility] OR;  Service: Plastics;  Laterality: Bilateral;  . CESAREAN SECTION  2004; 2013  . CHOLECYSTECTOMY   03/02/2012   Procedure: LAPAROSCOPIC CHOLECYSTECTOMY WITH INTRAOPERATIVE CHOLANGIOGRAM;  Surgeon: Currie Paris, MD;  Location: [facility] OR;  Service: General;  Laterality: N/A;  . DILATION AND CURETTAGE OF UTERUS  ~ 1997; ~ 2005  . DILITATION & CURRETTAGE/HYSTROSCOPY WITH THERMACHOICE ABLATION  04/28/2012   Procedure: DILATATION & CURETTAGE/HYSTEROSCOPY WITH THERMACHOICE ABLATION;  Surgeon: Jeani Hawking, MD;  Location: [facility] ORS;  Service: Gynecology;  Laterality: N/A;  . ERCP  03/03/2012   Procedure: ENDOSCOPIC RETROGRADE CHOLANGIOPANCREATOGRAPHY (ERCP);  Surgeon: Theda Belfast, MD;  Location: [facility] ENDOSCOPY;  Service: Endoscopy;  Laterality: N/A;  dl/hung  . LAPAROSCOPY  2011  . LUMBAR DISC SURGERY  2003; 2008; 2009  . MULTIPLE TOOTH EXTRACTIONS  "scattered dates"  . POSTERIOR LUMBAR FUSION  2010  . REDUCTION MAMMAPLASTY Bilateral 02/27/2015  . TUBAL LIGATION  2013  . WISDOM TOOTH EXTRACTION  2001     OB History    Gravida  3   Para  2   Term  2   Preterm      AB  1   Living  2     SAB      TAB  1   Ectopic      Multiple      Live Births  2            Home Medications    Prior to Admission medications   Medication Sig Start Date End Date Taking? Authorizing Provider  ALPRAZolam Prudy Feeler) 1 MG tablet Take 0.5 mg by mouth 2 (two) times daily as needed for anxiety.    Yes [provider]  etodolac (LODINE XL) 400 MG 24 hr tablet Take 400 mg by mouth 2 (two) times daily as needed for pain. 10/31/18  Yes [provider]  ibuprofen (ADVIL,MOTRIN) 200 MG tablet Take 200 mg by mouth every 6 (six) hours as needed for fever.   Yes [provider]  meclizine (ANTIVERT) 25 MG tablet Take 25 mg by mouth 3 (three) times daily as needed for dizziness.   Yes [provider]    Family History Family History  Problem Relation Age of Onset  . Stroke Mother   . Arthritis Mother   . Depression Mother   . COPD Father   . Kidney disease  Paternal Uncle   . Stroke Maternal Grandfather   . Heart disease Paternal Grandfather     Social History Social History   Tobacco Use  . Smoking status: Former Smoker    Packs/day: 0.25    Years: 2.00    Pack years: 0.50    Types: Cigarettes  . Smokeless tobacco: Never Used  . Tobacco comment: "quit smoking ~ 2000  Substance Use Topics  . Alcohol use: Yes    Alcohol/week: 0.0 standard drinks    Comment: 02/27/2015 "might have a few drinks a couple times/yr"  . Drug use:  No     Allergies   Darvocet [propoxyphene n-acetaminophen], Codeine, and Sulfa antibiotics   Review of Systems Review of Systems  Constitutional: Negative for activity change, chills and fever.  HENT: Negative for congestion, sinus pressure, sinus pain, sore throat and voice change.   Eyes: Negative for photophobia, pain, itching and visual disturbance.  Respiratory: Negative for shortness of breath.   Cardiovascular: Negative for chest pain.  Gastrointestinal: Positive for nausea. Negative for abdominal pain.  Genitourinary: Negative for dysuria.  Musculoskeletal: Negative for back pain, joint swelling, myalgias, neck pain and neck stiffness.  Skin: Negative for rash.  Allergic/Immunologic: Negative for immunocompromised state.  Neurological: Positive for dizziness, speech difficulty, weakness and headaches. Negative for tremors, seizures, syncope, light-headedness and numbness.  Psychiatric/Behavioral: Negative for confusion.     Physical Exam Updated Vital Signs BP (!) 141/87   Pulse 77   Temp 99.1 F (37.3 C) (Oral)   Resp 16   SpO2 99%   Physical Exam Vitals signs and nursing note reviewed.  Constitutional:      General: She is not in acute distress. HENT:     Head: Normocephalic.  Eyes:     Conjunctiva/sclera: Conjunctivae normal.  Neck:     Musculoskeletal: Neck supple.  Cardiovascular:     Rate and Rhythm: Normal rate and regular rhythm.     Pulses: Normal pulses.     Heart  sounds: Normal heart sounds. No murmur. No friction rub. No gallop.   Pulmonary:     Effort: Pulmonary effort is normal. No respiratory distress.     Breath sounds: No stridor. No wheezing, rhonchi or rales.  Abdominal:     General: There is no distension.     Palpations: Abdomen is soft.  Skin:    General: Skin is warm.     Findings: No rash.  Neurological:     Mental Status: She is alert.     Comments: Facial droop is noted on the right with smiling and raising eyebrows.  Cranial nerves are otherwise intact.  She has dysmetria with finger-to-nose on the right on multiple repeat assessments  She has mild dysarthria with word finding and intermittent slurred speech  Good grip strength of the bilateral upper extremities.  5/5 strength against resistance.  Sensation is intact to the bilateral upper extremities.  She has decreased sensation to the lateral aspect of the left leg, which is baseline secondary to her previous surgery, but is otherwise intact with sensation to the bilateral lower extremities.  No clonus bilaterally.  Negative Romberg.  No pronator drift.  She has some ataxia with tandem gait, but reports this is minimally changed from her baseline.  Psychiatric:        Behavior: Behavior normal.      ED Treatments / Results  Labs (all labs ordered are listed, but only abnormal results are displayed) Labs Reviewed  SARS CORONAVIRUS 2  PROTIME-INR  APTT  CBC  DIFFERENTIAL  COMPREHENSIVE METABOLIC PANEL  VITAMIN D 25 HYDROXY (VIT D DEFICIENCY, FRACTURES)  I-STAT CHEM 8, ED  CBG MONITORING, ED  I-STAT BETA HCG BLOOD, ED ([facility], [facility], [facility] ONLY)    EKG None  Radiology Ct Head Wo Contrast  Result Date: 12/12/2018 CLINICAL DATA:  Episodes of slurred speech, loss of balance. EXAM: CT HEAD WITHOUT CONTRAST TECHNIQUE: Contiguous axial images were obtained from the base of the skull through the vertex without intravenous contrast. COMPARISON:  May 05, 2014 FINDINGS: Brain:  No evidence of acute infarction, hemorrhage, hydrocephalus,  extra-axial collection or mass lesion/mass effect. Vascular: No hyperdense vessel or unexpected calcification. Skull: Normal. Negative for fracture or focal lesion. Sinuses/Orbits: No acute finding. Other: None. IMPRESSION: No acute intracranial abnormality. Electronically Signed   By: Ted Mcalpineobrinka  Dimitrova M.D.   On: 12/12/2018 19:08   Mr Brain Wo Contrast  Result Date: 12/13/2018 CLINICAL DATA:  Subacute neuro deficit. Slurred speech and difficulty with balance EXAM: MRI HEAD WITHOUT CONTRAST TECHNIQUE: Multiplanar, multiecho pulse sequences of the brain and surrounding structures were obtained without intravenous contrast. COMPARISON:  Head CT from yesterday FINDINGS: Brain: Ovoid area of weakly restricted diffusion in the left corona radiata measuring 12 mm. There are ovoid periventricular FLAIR hyperintensities in the cerebral white matter. No definite juxta cortical or infratentorial signal abnormality. No hemorrhage, hydrocephalus, or masslike finding. Normal brain volume Vascular: Major flow voids are preserved Skull and upper cervical spine: Negative for marrow lesion Sinuses/Orbits: Negative IMPRESSION: Suspect multiple sclerosis. A signal abnormality in the left corona radiata shows weak diffusion restriction favoring active demyelination. The main differential would be premature chronic small vessel ischemia with subacute infarct. Electronically Signed   By: Marnee SpringJonathon  Watts M.D.   On: 12/13/2018 06:13    Procedures Procedures (including critical care time)  Medications Ordered in ED Medications  sodium chloride flush (NS) 0.9 % injection 3 mL (3 mLs Intravenous Not Given 12/13/18 0305)  LORazepam (ATIVAN) injection 1 mg (1 mg Intravenous Given 12/13/18 0510)  diphenhydrAMINE (BENADRYL) injection 25 mg (25 mg Intravenous Given 12/13/18 0238)  sodium chloride 0.9 % bolus 500 mL (0 mLs Intravenous Stopped 12/13/18 0406)  prochlorperazine  (COMPAZINE) injection 10 mg (10 mg Intravenous Given 12/13/18 0302)     Initial Impression / Assessment and Plan / ED Course  I have reviewed the triage vital signs and the nursing notes.  Pertinent labs & imaging results that were available during my care of the patient were reviewed by me and considered in my medical decision making (see chart for details).        38 year old female of von Willebrand disease, anxiety, ADD, and vertigo who presents to the emergency department with multiple neurologic deficits over the last 3 days including slurred speech, right-sided facial droop, dysarthria, dizziness, headache, nausea, and increased clumsiness.  No constitutional symptoms.  On exam, she has right-sided dysmetria with finger-to-nose, right-sided facial droop and mild dysarthria with waxing and waning slurred speech.  No weakness.  She was given migraine cocktail with Compazine and Benadryl with minimal improvement in her headache and dizziness.  CT head is negative.  Labs are overall reassuring.  Given patient's symptoms, consulted neurology and spoke with Dr. Laurence SlateAroor who recommended MRI brain.  MRI brain concerning for multiple sclerosis favoring active demyelination. Spoke with Dr. Laurence SlateAroor who recommends medical admission with a vitamin D level and MR brain with and without an MR cervical spine.  COVID-19 test has been ordered and is pending.  Consulted for admission and spoke with Dr. Ophelia CharterYates, hospitalist, who will accept the patient for admission. The patient appears reasonably stabilized for admission considering the current resources, flow, and capabilities available in the ED at this time, and I doubt any other [facility] requiring further screening and/or treatment in the ED prior to admission.   Final Clinical Impressions(s) / ED Diagnoses   Final diagnoses:  Multiple sclerosis exacerbation [facility](HCC)    ED Discharge Orders    None       Barkley BoardsMcDonald, Elis Sauber A, PA-C 12/13/18 0815    Horton,  Mayer Maskerourtney F, MD  12/17/18 2254  

## 2018-12-13 NOTE — Evaluation (Signed)
Occupational Therapy Evaluation Patient Details Name: Ann [name] MRN: [id number] DOB: [birth year] Today's Date: 12/13/2018    History of Present Illness Pt is a 38 yo female s/p slurred speech, facial droop. Pt PMHx: anxiety, migraines, vertigo, spine surgeries and Willebrand's Dx.   Clinical Impression   Pt PTA: living with family, independent and working from home. Pt currently, with mild facial droop and family/pt report "speech still sounds different." Pt is comprehendible and no slurring noted upon exam. Pt with no physical focal deficits disrupting ADL and mobility. Pt is modified independent with ADL and mobility. Sensation and vision are intact; and no muscle weakness noted. Pt ambulating well in room. Pt does not require continued OT. OT signing off.  Pt reported "sometimes I have trouble swallowing by food and drinks. Feels like it doesn't go down well at all." OTR alerted pt that an SLP would evaluate her as well.      Follow Up Recommendations  No OT follow up    Equipment Recommendations  None recommended by OT    Recommendations for Other Services       Precautions / Restrictions Precautions Precautions: None Restrictions Weight Bearing Restrictions: No      Mobility Bed Mobility Overal bed mobility: Modified Independent                Transfers Overall transfer level: Modified independent               General transfer comment: no physical assist required    Balance Overall balance assessment: No apparent balance deficits (not formally assessed)                                         ADL either performed or assessed with clinical judgement   ADL Overall ADL's : At baseline                                       General ADL Comments: No physical assist required     Vision Baseline Vision/History: No visual deficits Vision Assessment?: No apparent visual deficits     Perception     Praxis       Pertinent Vitals/Pain Pain Assessment: No/denies pain     Hand Dominance Right   Extremity/Trunk Assessment Upper Extremity Assessment Upper Extremity Assessment: Overall WFL for tasks assessed   Lower Extremity Assessment Lower Extremity Assessment: Overall WFL for tasks assessed   Cervical / Trunk Assessment Cervical / Trunk Assessment: Normal   Communication Communication Communication: No difficulties   Cognition Arousal/Alertness: Awake/alert Behavior During Therapy: WFL for tasks assessed/performed Overall Cognitive Status: Within Functional Limits for tasks assessed                                     General Comments       Exercises     Shoulder Instructions      Home Living Family/patient expects to be discharged to:: Private residence Living Arrangements: Spouse/significant other Available Help at Discharge: Family;Available 24 hours/day Type of Home: House Home Access: Stairs to enter [employer] of Steps: 3 Entrance Stairs-Rails: None Home Layout: Bed/bath upstairs     Bathroom Shower/Tub: Occupational psychologist: Standard  Home Equipment: None          Prior Functioning/Environment Level of Independence: Independent                 OT Problem List:        OT Treatment/Interventions:      OT Goals(Current goals can be found in the care plan section) Acute Rehab OT Goals Patient Stated Goal: to go home soon OT Goal Formulation: With patient  OT Frequency:     Barriers to D/C:            Co-evaluation              AM-PAC OT "6 Clicks" Daily Activity     Outcome Measure Help from another person eating meals?: None Help from another person taking care of personal grooming?: None Help from another person toileting, which includes using toliet, bedpan, or urinal?: None Help from another person bathing (including washing, rinsing, drying)?: None Help from another person to put  on and taking off regular upper body clothing?: None Help from another person to put on and taking off regular lower body clothing?: None 6 Click Score: 24   End of Session Equipment Utilized During Treatment: Gait belt Nurse Communication: Mobility status  Activity Tolerance: Patient tolerated treatment well Patient left: in bed;with call bell/phone within reach  OT Visit Diagnosis: Unsteadiness on feet (R26.81)                Time: 1884-1660 OT Time Calculation (min): 15 min Charges:  OT General Charges $OT Visit: 1 Visit OT Evaluation $OT Eval Moderate Complexity: 1 Mod  Cristi Loron) Glendell Docker OTR/L Acute Rehabilitation Services Pager: [phone] Office: [phone]    Gunnar Fusi Syeda Prickett 12/13/2018, 5:04 PM

## 2018-12-14 DIAGNOSIS — F419 Anxiety disorder, unspecified: Secondary | ICD-10-CM

## 2018-12-14 LAB — HIV ANTIBODY (ROUTINE TESTING W REFLEX): HIV Screen 4th Generation wRfx: NONREACTIVE

## 2018-12-14 LAB — LIPID PANEL
Cholesterol: 185 mg/dL (ref 0–200)
HDL: 52 mg/dL (ref >40)
LDL Cholesterol: 122 mg/dL — ABNORMAL HIGH (ref 0–99)
Total CHOL/HDL Ratio: 3.6 RATIO
Triglycerides: 56 mg/dL (ref <150)
VLDL: 11 mg/dL (ref 0–40)

## 2018-12-14 LAB — VITAMIN D 25 HYDROXY (VIT D DEFICIENCY, FRACTURES): Vit D, 25-Hydroxy: 15.8 ng/mL — ABNORMAL LOW (ref 30.0–100.0)

## 2018-12-14 LAB — HEMOGLOBIN A1C
Hgb A1c MFr Bld: 5.1 % (ref 4.8–5.6)
Mean Plasma Glucose: 99.67 mg/dL

## 2018-12-14 MED ORDER — KETOROLAC TROMETHAMINE 30 MG/ML IJ SOLN
30.0000 mg | Freq: Once | INTRAMUSCULAR | Status: AC
  Administered 2018-12-14: 30 mg via INTRAVENOUS
  Filled 2018-12-14: qty 1

## 2018-12-14 MED ORDER — PROCHLORPERAZINE EDISYLATE 10 MG/2ML IJ SOLN
10.0000 mg | Freq: Once | INTRAMUSCULAR | Status: AC
  Administered 2018-12-14: 10 mg via INTRAVENOUS
  Filled 2018-12-14: qty 2

## 2018-12-14 MED ORDER — ZOLPIDEM TARTRATE 5 MG PO TABS
5.0000 mg | ORAL_TABLET | Freq: Every evening | ORAL | Status: DC | PRN
  Administered 2018-12-14: 20:00:00 5 mg via ORAL
  Filled 2018-12-14: qty 1

## 2018-12-14 NOTE — Evaluation (Signed)
Clinical/Bedside Swallow Evaluation Patient Details  Name: Ann [name] MRN: [id number] Date of Birth: 26-Dec-1980  Today's Date: 12/14/2018 Time: SLP Start Time (ACUTE ONLY): 1100 SLP Stop Time (ACUTE ONLY): 1120 SLP Time Calculation (min) (ACUTE ONLY): 20 min  Past Medical History:  Past Medical History:  Diagnosis Date  . ADD (attention deficit disorder)    takes Adderall daily  . Anemia   . Anxiety    takes Xanax daily as needed  . Asthma    mild case per pt  . Chronic lower back pain   . Depression    has meds prescribed but doesn't take them  . Family history of adverse reaction to anesthesia    "Mom gets PONV too"  . Headache    "monthly" (02/27/2015)  . Hemorrhoids   . History of esophagogastroduodenoscopy (EGD)   . Migraine    "none in the last 6 months" (02/27/2015)  . Nocturia   . Pneumonia "several times"  . PONV (postoperative nausea and vomiting)   . Von Willebrand disease ([place])    Dr. Beryle Beams   Past Surgical History:  Past Surgical History:  Procedure Laterality Date  . BACK SURGERY    . BREAST CYST EXCISION Left 02/20/2018   Procedure: EXCISION OF LEFT BREAST SEBACEOUS CYST;  Surgeon: Rolm Bookbinder, MD;  Location: [place];  Service: General;  Laterality: Left;  . BREAST REDUCTION SURGERY Bilateral 02/27/2015   Procedure: BILATERAL BREAST REDUCTION WITH FREE NIPPLE GRAFT TECHNIQUE ON RIGHT BREAST;  Surgeon: Crissie Reese, MD;  Location: [place];  Service: Plastics;  Laterality: Bilateral;  . CESAREAN SECTION  2004; 2013  . CHOLECYSTECTOMY  03/02/2012   Procedure: LAPAROSCOPIC CHOLECYSTECTOMY WITH INTRAOPERATIVE CHOLANGIOGRAM;  Surgeon: Haywood Lasso, MD;  Location: [place];  Service: General;  Laterality: N/A;  . DILATION AND CURETTAGE OF UTERUS  ~ 1997; ~ 2005  . DILITATION & CURRETTAGE/HYSTROSCOPY WITH THERMACHOICE ABLATION  04/28/2012   Procedure: DILATATION & CURETTAGE/HYSTEROSCOPY WITH THERMACHOICE ABLATION;  Surgeon:  Cyril Mourning, MD;  Location: [facility];  Service: Gynecology;  Laterality: N/A;  . ERCP  03/03/2012   Procedure: ENDOSCOPIC RETROGRADE CHOLANGIOPANCREATOGRAPHY (ERCP);  Surgeon: Beryle Beams, MD;  Location: [facility] END[facility]COPY;  Service: Endoscopy;  Laterality: N/A;  dl/hung  . LAPAROSCOPY  2011  . [place] SURGERY  2003; 2008; 2009  . MULTIPLE TOOTH EXTRACTIONS  "scattered dates"  . POSTERIOR LUMBAR FUSION  2010  . REDUCTION MAMMAPLASTY Bilateral 02/27/2015  . TUBAL LIGATION  2013  . WISDOM TOOTH EXTRACTION  2001   HPI:  Pt is a 38 y.o. female with medical history significant of Von Willebrand disease, and depression/anxiety who presented with slurred speech. MRI of the brain revealed a signal abnormality in the left corona radiata shows weak diffusion restriction favoring active demyelination. Multiple sclerosis was suspected.    Assessment / Plan / Recommendation Clinical Impression  Patient presents with a very mild oral dysphagia secondary to left sided oral-motor weakness. She did not exhibit any overt s/s of aspiration or penetration. Main difficulty she is currently having is prolonged oral transit of boluses and decreased bolus cohesion secondary to right-sided OM weakness, but she is self-managing independently. Pharyngeal contraction and laryngeal elevation were WFL per palpation. Patient did c/o mild, intermittent globus sensation, and although she did endorse coughing with liquids at times, but then when quesitoned further she only specifies difficulty with oral control and having to swallow more with liquids and solids, but symptoms that would indicate pharyngeal dysphagia.  Patient is safe with regular solids, thin liquids diet and does not need SLP tx for dysphagia as she is self-managing and at a very low risk of aspiration. SLP Visit Diagnosis: Dysphagia, oral phase (R13.11)    Aspiration Risk  No limitations    Diet Recommendation Regular;Thin liquid   Liquid Administration  via: Cup;Straw Medication Administration: Whole meds with liquid Supervision: Patient able to self feed Compensations: Lingual sweep for clearance of pocketing Postural Changes: Seated upright at 90 degrees    Other  Recommendations Oral Care Recommendations: Patient independent with oral care;Oral care BID   Follow up Recommendations Outpatient SLP (for cog and dysarthria, no f/u for swallow)     Frequency and Duration min 2x/week          Prognosis Prognosis for Safe Diet Advancement: Good      Swallow Study   General Date of Onset: 12/14/18 HPI: Pt is a 38 y.o. female with medical history significant of Von Willebrand disease, and depression/anxiety who presented with slurred speech. MRI of the brain revealed a signal abnormality in the left corona radiata shows weak diffusion restriction favoring active demyelination. Multiple sclerosis was suspected.  Type of Study: Bedside Swallow Evaluation Previous Swallow Assessment: N/A Diet Prior to this Study: Regular;Thin liquids Temperature Spikes Noted: No Respiratory Status: Room air History of Recent Intubation: No Behavior/Cognition: Alert;Cooperative;Pleasant mood Oral Cavity Assessment: Within Functional Limits Oral Care Completed by SLP: No Oral Cavity - Dentition: Adequate natural dentition Vision: Functional for self-feeding Self-Feeding Abilities: Able to feed self Patient Positioning: Upright in bed Baseline Vocal Quality: Normal Volitional Cough: Strong Volitional Swallow: Able to elicit    Oral/Motor/Sensory Function Overall Oral Motor/Sensory Function: Mild impairment Facial ROM: Reduced right Facial Symmetry: Abnormal symmetry right Facial Strength: Reduced right Facial Sensation: Reduced right Lingual ROM: Within Functional Limits Lingual Symmetry: Within Functional Limits Lingual Strength: Within Functional Limits Lingual Sensation: Within Functional Limits Velum: Within Functional Limits Mandible:  Within Functional Limits   Ice Chips Ice chips: Not tested   Thin Liquid Thin Liquid: Impaired Presentation: Straw;Self Fed Oral Phase Impairments: Reduced labial seal Other Comments: reduced labial seal on right    Nectar Thick     Honey Thick     Puree Puree: Impaired Presentation: Spoon Oral Phase Functional Implications: Prolonged oral transit   Solid     Solid: Impaired Presentation: Self Fed Oral Phase Functional Implications: Prolonged oral transit      Pablo Lawrence 12/14/2018,12:34 PM  Angela Nevin, MA, CCC-SLP Speech Therapy [facility] Acute Rehab

## 2018-12-14 NOTE — Progress Notes (Signed)
Subjective: No changes  Exam: Vitals:   12/14/18 0351 12/14/18 0822  BP: 122/69 135/88  Pulse: 93 87  Resp: 14 16  Temp: 98.6 F (37 C) 97.8 F (36.6 C)  SpO2: 97% 94%   Gen: In bed, NAD Resp: non-labored breathing, no acute distress Abd: soft, nt  Neuro: MS: awake, alert, interactive and appropriate CN: Right pupil is smaller than left, and there does appear to be an afferent pupillary defect on the right, EOMI Motor: 5/5 throughout, mildly impaired fine motor of right hand.  Sensory:intact to LT   Impression: 38 yo F with new facial weakness and MRI consistent with MS. From description of her visual change that she had several years ago, I do wonder if this represents optic neuritis, though I do not have access to the ophthalmologic records.  In any case, at the current time I would continue IV Solu-Medrol given that her MRI is very consistent with multiple sclerosis with enhancing and nonenhancing lesions.  Recommendations: 1) continue IV Solu-Medrol, tomorrow will be day 3 we can assess for improvement.  Roland Rack, MD [facility] [phone]  If 7pm- 7am, please page neurology on call as listed in [place].

## 2018-12-14 NOTE — Progress Notes (Signed)
PT Cancellation Note  Patient Details Name: Ann [name] MRN: [id number] DOB: 1981/03/06   Cancelled Treatment:    Reason Eval/Treat Not Completed: PT screened, no needs identified, will sign off Pt c/o swallowing and speech difficulties, no difficulty with gait or mobility.  No PT needs at this time.  Schawn Byas 12/14/2018, 9:54 AM

## 2018-12-14 NOTE — Progress Notes (Signed)
  Speech Language Pathology Treatment: Cognitive-Linquistic  Patient Details Name: Ann [name] MRN: [id number] DOB: [birth year] Today's Date: 12/14/2018 Time: 4401-0272 SLP Time Calculation (min) (ACUTE ONLY): 22 min  Assessment / Plan / Recommendation Clinical Impression  Pt was seen for dysarthria treatment and was cooperative throughout the session. She was educated regarding the nature of dysarthria and compensatory strategies for speech intelligibility. The dysarthria handout was provided to facilitate education and pt verbalized understanding regarding all areas of education. She was able to use compensatory strategies at the phrase level with 70% accuracy increasing to 100% accuracy with mod cues for overarticulation and vocal intensity. At the 5-7 word sentence level she demonstrated 60% accuracy increasing to 100% accuracy with mod cues. Pt demonstrated 67% accuracy with time management problems increasing to 100% with min. cues. Pt reported symptoms of pharyngeal dysphagia during the session characterized by signs of aspiration with thin liquids. An order for a swallowing evaluation has been requested and was placed by Ann [name], Therapist, sports. SLP will continue to follow pt.     HPI HPI: Pt is a 38 y.o. female with medical history significant of Von Willebrand disease, and depression/anxiety who presented with slurred speech. MRI of the brain revealed a signal abnormality in the left corona radiata shows weak diffusion restriction favoring active demyelination. Multiple sclerosis was suspected.       SLP Plan     Patient needs continued Speech Lanaguage Pathology Services    Recommendations                   Follow up Recommendations: Outpatient SLP SLP Visit Diagnosis: Dysarthria and anarthria (R47.1);Cognitive communication deficit (R41.841)       Ann [name] I. Ann [name], [place], [place] Office number [phone] Pager [place] 12/14/2018, 11:01 AM

## 2018-12-14 NOTE — Evaluation (Signed)
Speech Language Pathology Evaluation Patient Details Name: Ann [name] MRN: [id number] DOB: 02-Jan-1981 Today's Date: 12/14/2018 Time: 5638-7564 SLP Time Calculation (min) (ACUTE ONLY): 27 min  Problem List:  Patient Active Problem List   Diagnosis Date Noted  . Slurred speech 12/13/2018  . Anxiety 12/13/2018  . Breast hypertrophy in female 02/27/2015  . Hyponatremia 03/04/2012  . Acute cholecystitis 03/02/2012  . Morbid obesity ([place]) 08/25/2011  . Von Willebrand disease - hx PPH 04/14/2011  . History of abnormal Pap smear - had colpo 04/14/2011  . Infertility, female 04/14/2011  . HSV-2 infection - history, no outbreaks 04/14/2011  . Anemia 04/14/2011  . Asthma - uses inhaler PRN 04/14/2011  . History of pyelonephritis - frequent history as a child 04/14/2011  . Migraine 04/14/2011  . History of depression 04/14/2011  . History of smoking 04/14/2011   Past Medical History:  Past Medical History:  Diagnosis Date  . ADD (attention deficit disorder)    takes Adderall daily  . Anemia   . Anxiety    takes Xanax daily as needed  . Asthma    mild case per pt  . Chronic lower back pain   . Depression    has meds prescribed but doesn't take them  . Family history of adverse reaction to anesthesia    "Mom gets PONV too"  . Headache    "monthly" (02/27/2015)  . Hemorrhoids   . History of esophagogastroduodenoscopy (EGD)   . Migraine    "none in the last 6 months" (02/27/2015)  . Nocturia   . Pneumonia "several times"  . PONV (postoperative nausea and vomiting)   . Von Willebrand disease ([place])    Dr. Beryle Beams   Past Surgical History:  Past Surgical History:  Procedure Laterality Date  . BACK SURGERY    . BREAST CYST EXCISION Left 02/20/2018   Procedure: EXCISION OF LEFT BREAST SEBACEOUS CYST;  Surgeon: Rolm Bookbinder, MD;  Location: [place];  Service: General;  Laterality: Left;  . BREAST REDUCTION SURGERY Bilateral 02/27/2015    Procedure: BILATERAL BREAST REDUCTION WITH FREE NIPPLE GRAFT TECHNIQUE ON RIGHT BREAST;  Surgeon: Crissie Reese, MD;  Location: [place];  Service: Plastics;  Laterality: Bilateral;  . CESAREAN SECTION  2004; 2013  . CHOLECYSTECTOMY  03/02/2012   Procedure: LAPAROSCOPIC CHOLECYSTECTOMY WITH INTRAOPERATIVE CHOLANGIOGRAM;  Surgeon: Haywood Lasso, MD;  Location: [place];  Service: General;  Laterality: N/A;  . DILATION AND CURETTAGE OF UTERUS  ~ 1997; ~ 2005  . DILITATION & CURRETTAGE/HYSTROSCOPY WITH THERMACHOICE ABLATION  04/28/2012   Procedure: DILATATION & CURETTAGE/HYSTEROSCOPY WITH THERMACHOICE ABLATION;  Surgeon: Cyril Mourning, MD;  Location: [facility];  Service: Gynecology;  Laterality: N/A;  . ERCP  03/03/2012   Procedure: ENDOSCOPIC RETROGRADE CHOLANGIOPANCREATOGRAPHY (ERCP);  Surgeon: Beryle Beams, MD;  Location: [facility] ENDOSCOPY;  Service: Endoscopy;  Laterality: N/A;  dl/hung  . LAPAROSCOPY  2011  . [place] SURGERY  2003; 2008; 2009  . MULTIPLE TOOTH EXTRACTIONS  "scattered dates"  . POSTERIOR LUMBAR FUSION  2010  . REDUCTION MAMMAPLASTY Bilateral 02/27/2015  . TUBAL LIGATION  2013  . WISDOM TOOTH EXTRACTION  2001   HPI:  Pt is a 38 y.o. female with medical history significant of Von Willebrand disease, and depression/anxiety who presented with slurred speech. MRI of the brain revealed a signal abnormality in the left corona radiata shows weak diffusion restriction favoring active demyelination. Multiple sclerosis was suspected.    Assessment / Plan / Recommendation Clinical Impression  Pt reported that she was living independently prior to admsssion without deficits in speech, language or cognition. She stated that she has a master's degree in Conservation officer, nature and was working full-time prior to admission. Pt reported that her speech is still "slurred" and that she feels that her brain has been "foggy" and she has needed additional time to process information.   The  [facility] Cognitive Assessment 8.1 was completed to evaluate the pt's cognitive-linguistic skills. She  achieved a score of 25/30 which is slightly below the normal limits of 26 or more out of 30 and is suggestive of a mild impairment. She demonstrated deficits in the areas of executive function, abstract reasoning, and delayed recall. Repetition and additional processing time was needed for some tasks and pt expressed that she could tell her cognition was not at baseline. Pt also presented with mild dysarthria characterized by imprecise articulation and a reduced vocal intensity which negatively impacted speech intelligibility during conversation. Skilled SLP services are clinically indicated at this time to improve cognition and dysarthria. Pt and nursing were educated regarding results and recommendations; both parties verbalized understanding as well as agreement with plan of care.    SLP Assessment  SLP Recommendation/Assessment: Patient needs continued Speech Lanaguage Pathology Services SLP Visit Diagnosis: Dysarthria and anarthria (R47.1);Cognitive communication deficit (R41.841)    Follow Up Recommendations  Outpatient SLP    Frequency and Duration min 2x/week  2 weeks      SLP Evaluation Cognition  Overall Cognitive Status: Impaired/Different from baseline Arousal/Alertness: Awake/alert Orientation Level: Oriented X4 Attention: Focused;Sustained Focused Attention: Appears intact(Vigilance WNL: 1/1) Sustained Attention: Appears intact(Serial 7s: 3/3) Memory: Impaired Memory Impairment: Storage deficit;Retrieval deficit;Decreased recall of new information(Immediate: 5/5; delayed: 3/5; with cues: 2/2) Awareness: Appears intact Problem Solving: Impaired Problem Solving Impairment: Verbal complex Executive Function: Reasoning;Sequencing;Organizing Reasoning: Impaired Reasoning Impairment: Verbal complex(Abstraction: 1/2) Sequencing: Impaired Sequencing Impairment: Verbal  complex(Clock drawing: 2/3) Organizing: Appears intact(Backward digit span: 1/1)       Comprehension  Auditory Comprehension Overall Auditory Comprehension: Appears within functional limits for tasks assessed Yes/No Questions: Within Functional Limits Commands: Within Functional Limits Complex Commands: (Trail completion: 0/1) Conversation: Complex Visual Recognition/Discrimination Discrimination: Within Function Limits Reading Comprehension Reading Status: Within funtional limits    Expression Expression Primary Mode of Expression: Verbal Verbal Expression Overall Verbal Expression: Appears within functional limits for tasks assessed Initiation: No impairment Level of Generative/Spontaneous Verbalization: Conversation Repetition: No impairment Naming: No impairment Confrontation: (3/3) Divergent: (1/1) Pragmatics: No impairment Written Expression Dominant Hand: Right Written Expression: (Copying cube: 1/1)   Oral / Motor  Oral Motor/Sensory Function Overall Oral Motor/Sensory Function: Mild impairment Facial ROM: Reduced right;Suspected CN VII (facial) dysfunction Facial Symmetry: Abnormal symmetry right;Suspected CN VII (facial) dysfunction Facial Strength: Reduced right;Suspected CN VII (facial) dysfunction Facial Sensation: Within Functional Limits Lingual ROM: Reduced left;Suspected CN XII (hypoglossal) dysfunction Lingual Symmetry: Within Functional Limits Lingual Strength: Reduced;Suspected CN XII (hypoglossal) dysfunction Lingual Sensation: Reduced;Suspected CN VII (facial) dysfunction-anterior 2/3 tongue Velum: Within Functional Limits Mandible: Within Functional Limits Motor Speech Overall Motor Speech: Impaired Respiration: Within functional limits Phonation: Low vocal intensity Resonance: Within functional limits Articulation: Impaired Level of Impairment: Sentence Intelligibility: Intelligibility reduced Word: 75-100% accurate Phrase: 75-100%  accurate Sentence: 75-100% accurate Conversation: 50-74% accurate Motor Planning: Witnin functional limits Motor Speech Errors: Not applicable   Ann [name] I. Vear Clock, MS, CCC-SLP Acute Rehabilitation Services Office number [phone] Pager [phone]  Ann [name] 12/14/2018, 10:56 AM

## 2018-12-14 NOTE — Progress Notes (Signed)
PROGRESS NOTE    Ann [name]  BJY:[id number] DOB: Apr 08, 1981 DOA: 12/12/2018 PCP: Simona Huh, NP   Brief Narrative: Ann [name] is a 38 y.o. female with medical history significant of Von Willebrand disease; and depression/anxiety. She presented with a slurred speech and found to have evidence of multiple sclerosis.   Assessment & Plan:   Principal Problem:   Slurred speech Active Problems:   Von Willebrand disease - hx PPH   Morbid obesity ([place])   Anxiety   Slurred speech In setting of likely multiple sclerosis. Improved from admission. Still with right sided facial droop right arm weakness. Started on IV steroids for treatment of acute MS -Neurology recommendations: steroids  Headache History of migraines -Compazine and Toradol IV x1  Insomnia -Ambien 5 mg prn  Morbid obesity BMI not available at this time.  Anxiety -Continue Xanax prn   DVT prophylaxis: Lovenox Code Status:   Code Status: Full Code Family Communication: none at bedside Disposition Plan: Discharge pending neuro recommendations for treatment   Consultants:   Neurology  Procedures:   None  Antimicrobials:  None    Subjective: Posterior headache today.  Objective: Vitals:   12/13/18 2338 12/14/18 0351 12/14/18 0822 12/14/18 1213  BP: 134/74 122/69 135/88 (!) 152/94  Pulse: 97 93 87 91  Resp: 15 14 16 16   Temp: 99 F (37.2 C) 98.6 F (37 C) 97.8 F (36.6 C) 98.5 F (36.9 C)  TempSrc: Oral Oral Oral Oral  SpO2: 96% 97% 94% 95%    Intake/Output Summary (Last 24 hours) at 12/14/2018 1750 Last data filed at 12/14/2018 1000 Gross per 24 hour  Intake 841.52 ml  Output -  Net 841.52 ml   There were no vitals filed for this visit.  Examination:  General exam: Appears calm and comfortable Respiratory system: Clear to auscultation. Respiratory effort normal. Cardiovascular system: S1 & S2 heard, RRR. No murmurs, rubs, gallops or clicks. Gastrointestinal  system: Abdomen is nondistended, soft and nontender. No organomegaly or masses felt. Normal bowel sounds heard. Central nervous system: Alert and oriented. Right facial droop. Right arm strength of 4/5 compared to strength of 5/5 on left Extremities: No edema. No calf tenderness Skin: No cyanosis. No rashes Psychiatry: Judgement and insight appear normal. Mood & affect appropriate.     Data Reviewed: I have personally reviewed following labs and imaging studies  CBC: Recent Labs  Lab 12/12/18 1739 12/12/18 1809  WBC 9.1  --   NEUTROABS 5.1  --   HGB 13.4 14.3  HCT 41.6 42.0  MCV 97.2  --   PLT 341  --    Basic Metabolic Panel: Recent Labs  Lab 12/12/18 1739 12/12/18 1809  NA 139 141  K 3.7 3.7  CL 106 106  CO2 23  --   GLUCOSE 94 90  BUN 7 7  CREATININE 0.87 0.80  CALCIUM 9.5  --    GFR: CrCl cannot be calculated (Unknown ideal weight.). Liver Function Tests: Recent Labs  Lab 12/12/18 1739  AST 24  ALT 24  ALKPHOS 54  BILITOT 0.6  PROT 7.6  ALBUMIN 4.3   No results for input(s): LIPASE, AMYLASE in the last 168 hours. No results for input(s): AMMONIA in the last 168 hours. Coagulation Profile: Recent Labs  Lab 12/12/18 1739  INR 1.1   Cardiac Enzymes: No results for input(s): CKTOTAL, CKMB, CKMBINDEX, TROPONINI in the last 168 hours. BNP (last 3 results) No results for input(s): PROBNP in the last 8760 hours. HbA1C:  Recent Labs    12/14/18 0400  HGBA1C 5.1   CBG: No results for input(s): GLUCAP in the last 168 hours. Lipid Profile: Recent Labs    12/14/18 0400  CHOL 185  HDL 52  LDLCALC 122*  TRIG 56  CHOLHDL 3.6   Thyroid Function Tests: Recent Labs    12/13/18 1312  TSH 0.979   Anemia Panel: No results for input(s): VITAMINB12, FOLATE, FERRITIN, TIBC, IRON, RETICCTPCT in the last 72 hours. Sepsis Labs: No results for input(s): PROCALCITON, LATICACIDVEN in the last 168 hours.  Recent Results (from the past 240 hour(s))  SARS  CORONAVIRUS 2 Nasal Swab Aptima Multi Swab     Status: None   Collection Time: 12/13/18  7:27 AM   Specimen: Aptima Multi Swab; Nasal Swab  Result Value Ref Range Status   SARS Coronavirus 2 NEGATIVE NEGATIVE Final    Comment: (NOTE) SARS-CoV-2 target nucleic acids are NOT DETECTED. The SARS-CoV-2 RNA is generally detectable in upper and lower respiratory specimens during the acute phase of infection. Negative results do not preclude SARS-CoV-2 infection, do not rule out co-infections with other pathogens, and should not be used as the sole basis for treatment or other patient management decisions. Negative results must be combined with clinical observations, patient history, and epidemiological information. The expected result is Negative. Fact Sheet for Patients: HairSlick.nohttps://www.fda.gov/media/138098/download Fact Sheet for Healthcare Providers: quierodirigir.comhttps://www.fda.gov/media/138095/download This test is not yet approved or cleared by the [place] FDA and  has been authorized for detection and/or diagnosis of SARS-CoV-2 by FDA under an Emergency Use Authorization (EUA). This EUA will remain  in effect (meaning this test can be used) for the duration of the COVID-19 declaration under Section 56 4(b)(1) of the Act, 21 U.S.C. section 360bbb-3(b)(1), unless the authorization is terminated or revoked sooner. Performed at [facility], [address] [place], [place] [address]          Radiology Studies: Ct Head Wo Contrast  Result Date: 12/12/2018 CLINICAL DATA:  Episodes of slurred speech, loss of balance. EXAM: CT HEAD WITHOUT CONTRAST TECHNIQUE: Contiguous axial images were obtained from the base of the skull through the vertex without intravenous contrast. COMPARISON:  May 05, 2014 FINDINGS: Brain: No evidence of acute infarction, hemorrhage, hydrocephalus, extra-axial collection or mass lesion/mass effect. Vascular: No hyperdense vessel or unexpected calcification.  Skull: Normal. Negative for fracture or focal lesion. Sinuses/Orbits: No acute finding. Other: None. IMPRESSION: No acute intracranial abnormality. Electronically Signed   By: Ted Mcalpineobrinka  Dimitrova M.D.   On: 12/12/2018 19:08   Mr Brain Wo Contrast  Result Date: 12/13/2018 CLINICAL DATA:  Subacute neuro deficit. Slurred speech and difficulty with balance EXAM: MRI HEAD WITHOUT CONTRAST TECHNIQUE: Multiplanar, multiecho pulse sequences of the brain and surrounding structures were obtained without intravenous contrast. COMPARISON:  Head CT from yesterday FINDINGS: Brain: Ovoid area of weakly restricted diffusion in the left corona radiata measuring 12 mm. There are ovoid periventricular FLAIR hyperintensities in the cerebral white matter. No definite juxta cortical or infratentorial signal abnormality. No hemorrhage, hydrocephalus, or masslike finding. Normal brain volume Vascular: Major flow voids are preserved Skull and upper cervical spine: Negative for marrow lesion Sinuses/Orbits: Negative IMPRESSION: Suspect multiple sclerosis. A signal abnormality in the left corona radiata shows weak diffusion restriction favoring active demyelination. The main differential would be premature chronic small vessel ischemia with subacute infarct. Electronically Signed   By: Marnee SpringJonathon  Watts M.D.   On: 12/13/2018 06:13   Mr Brain W Contrast  Result Date:  12/13/2018 CLINICAL DATA:  Multiple sclerosis.  New neurologic event. EXAM: MRI HEAD WITH CONTRAST TECHNIQUE: Multiplanar, multiecho pulse sequences of the brain and surrounding structures were obtained with intravenous contrast. CONTRAST:  8 mL Gadovist IV COMPARISON:  MRI brain without contrast or earlier today FINDINGS: White matter lesion in the corona radiata on the left shows mild patchy enhancement. This area showed increased signal on diffusion-weighted imaging. Additional lesions in the periventricular white matter bilaterally are strongly suspicious for multiple  sclerosis. No lesions in the posterior fossa Ventricle size normal.  No other enhancing lesions. IMPRESSION: Periventricular white matter lesions are present bilaterally, highly suspicious for multiple sclerosis. Patchy enhancement in a lesion in the left corona radiata most consistent with active demyelinization. Electronically Signed   By: Marlan Palau M.D.   On: 12/13/2018 19:50   Mr Cervical Spine W Wo Contrast  Result Date: 12/13/2018 CLINICAL DATA:  Slurred speech and suspected demyelinating disease. EXAM: MRI CERVICAL SPINE WITHOUT AND WITH CONTRAST TECHNIQUE: Multiplanar and multiecho pulse sequences of the cervical spine, to include the craniocervical junction and cervicothoracic junction, were obtained without and with intravenous contrast. CONTRAST:  10 mL Gadavist COMPARISON:  Brain MRI 12/13/2018 FINDINGS: Alignment: Normal Vertebrae: No acute compression fracture, discitis-osteomyelitis, facet edema or other focal marrow lesion. No epidural collection. Cord: Normal signal and morphology. No contrast enhancement. Posterior Fossa, vertebral arteries, paraspinal tissues: Visualized posterior fossa is normal. Vertebral artery flow voids are preserved. No prevertebral soft tissue swelling. Disc levels: The intervertebral disc spaces from C2-T1 are normal. T1-2: Small right subarticular disc protrusion narrows the ventral thecal sac and indents the right anterior spinal cord. But there is no spinal canal or neural foraminal stenosis. IMPRESSION: 1. No evidence of demyelinating disease within the cervical spine. 2. Small T1-2 disc protrusion without spinal canal or neural foraminal stenosis. Electronically Signed   By: Deatra Robinson M.D.   On: 12/13/2018 19:33        Scheduled Meds: . enoxaparin (LOVENOX) injection  40 mg Subcutaneous Q24H  . pantoprazole (PROTONIX) IV  40 mg Intravenous Q24H   Continuous Infusions: . sodium chloride 50 mL/hr at 12/13/18 1123  . methylPREDNISolone  (SOLU-MEDROL) injection 1,000 mg (12/14/18 0933)     LOS: 1 day     Jacquelin Hawking, MD [facility] 12/14/2018, 5:50 PM  If 7PM-7AM, please contact night-coverage [email or web address]

## 2018-12-14 NOTE — Plan of Care (Signed)
Progressing towards goals

## 2018-12-15 LAB — GLUCOSE, CAPILLARY: Glucose-Capillary: 149 mg/dL — ABNORMAL HIGH (ref 70–99)

## 2018-12-15 LAB — NEUROMYELITIS OPTICA AUTOAB, IGG: NMO-IgG: <1.5 U/mL (ref 0.0–3.0)

## 2018-12-15 MED ORDER — QUETIAPINE FUMARATE 25 MG PO TABS
25.0000 mg | ORAL_TABLET | Freq: Two times a day (BID) | ORAL | Status: DC | PRN
  Administered 2018-12-16: 25 mg via ORAL
  Filled 2018-12-15: qty 1

## 2018-12-15 MED ORDER — KETOROLAC TROMETHAMINE 30 MG/ML IJ SOLN
30.0000 mg | Freq: Two times a day (BID) | INTRAMUSCULAR | Status: DC | PRN
  Administered 2018-12-15 (×2): 30 mg via INTRAVENOUS
  Filled 2018-12-15 (×2): qty 1

## 2018-12-15 MED ORDER — PROCHLORPERAZINE EDISYLATE 10 MG/2ML IJ SOLN
10.0000 mg | Freq: Two times a day (BID) | INTRAMUSCULAR | Status: DC | PRN
  Administered 2018-12-15 (×2): 10 mg via INTRAVENOUS
  Filled 2018-12-15 (×2): qty 2

## 2018-12-15 MED ORDER — PANTOPRAZOLE SODIUM 40 MG PO TBEC
40.0000 mg | DELAYED_RELEASE_TABLET | Freq: Every day | ORAL | Status: DC
  Administered 2018-12-16 – 2018-12-17 (×2): 40 mg via ORAL
  Filled 2018-12-15 (×2): qty 1

## 2018-12-15 MED ORDER — DIPHENHYDRAMINE HCL 25 MG PO CAPS
25.0000 mg | ORAL_CAPSULE | Freq: Four times a day (QID) | ORAL | Status: DC | PRN
  Administered 2018-12-15 – 2018-12-16 (×2): 25 mg via ORAL
  Filled 2018-12-15 (×2): qty 1

## 2018-12-15 NOTE — Progress Notes (Signed)
PROGRESS NOTE    Aarin N [name]  WUJ:[id number] DOB: 06/23/1980 DOA: 12/12/2018 PCP: Courtney ParisMcCoy, Rachel, NP   Brief Narrative: Ann [name] is a 38 y.o. female with medical history significant of Von Willebrand disease; and depression/anxiety. She presented with a slurred speech and found to have evidence of multiple sclerosis.   Assessment & Plan:   Principal Problem:   Slurred speech Active Problems:   Von Willebrand disease - hx PPH   Morbid obesity ([facility])   Anxiety   Multiple sclerosis MRI suggestive of diagnosis. Started on IV steroids. Still with right sided facial droop right arm weakness. Started on IV steroids for treatment of acute MS with plans for 5 day treatment -Neurology recommendations: IV steroids  Headache History of migraines -Compazine and Toradol IV prn  Insomnia -Ambien 5 mg prn -Benadryl PO prn  Morbid obesity BMI not available at this time.  Anxiety -Continue Xanax prn   DVT prophylaxis: Lovenox Code Status:   Code Status: Full Code Family Communication: none at bedside Disposition Plan: Discharge pending neuro recommendations for treatment   Consultants:   Neurology  Procedures:   None  Antimicrobials:  None    Subjective: Headache improved with treatment. Did not sleep well overnight.  Objective: Vitals:   12/15/18 0007 12/15/18 0333 12/15/18 0753 12/15/18 1202  BP: 125/82 123/66 121/78 (!) 146/89  Pulse: 92 71 86 80  Resp: 16 15 20 20   Temp: 98.4 F (36.9 C) 98.2 F (36.8 C) 98.1 F (36.7 C) 97.8 F (36.6 C)  TempSrc: Oral Oral Oral Oral  SpO2: 97% 97% 99% 99%    Intake/Output Summary (Last 24 hours) at 12/15/2018 1354 Last data filed at 12/15/2018 1000 Gross per 24 hour  Intake 570.45 ml  Output -  Net 570.45 ml   There were no vitals filed for this visit.  Examination:  General exam: Appears calm and comfortable Respiratory system: Clear to auscultation. Respiratory effort normal. Cardiovascular  system: S1 & S2 heard, RRR. No murmurs, rubs, gallops or clicks. Gastrointestinal system: Abdomen is nondistended, soft and nontender. No organomegaly or masses felt. Normal bowel sounds heard. Central nervous system: Alert and oriented. Right facial droop Extremities: No edema. No calf tenderness Skin: No cyanosis. No rashes Psychiatry: Judgement and insight appear normal. Mood & affect appropriate.      Data Reviewed: I have personally reviewed following labs and imaging studies  CBC: Recent Labs  Lab 12/12/18 1739 12/12/18 1809  WBC 9.1  --   NEUTROABS 5.1  --   HGB 13.4 14.3  HCT 41.6 42.0  MCV 97.2  --   PLT 341  --    Basic Metabolic Panel: Recent Labs  Lab 12/12/18 1739 12/12/18 1809  NA 139 141  K 3.7 3.7  CL 106 106  CO2 23  --   GLUCOSE 94 90  BUN 7 7  CREATININE 0.87 0.80  CALCIUM 9.5  --    GFR: CrCl cannot be calculated (Unknown ideal weight.). Liver Function Tests: Recent Labs  Lab 12/12/18 1739  AST 24  ALT 24  ALKPHOS 54  BILITOT 0.6  PROT 7.6  ALBUMIN 4.3   No results for input(s): LIPASE, AMYLASE in the last 168 hours. No results for input(s): AMMONIA in the last 168 hours. Coagulation Profile: Recent Labs  Lab 12/12/18 1739  INR 1.1   Cardiac Enzymes: No results for input(s): CKTOTAL, CKMB, CKMBINDEX, TROPONINI in the last 168 hours. BNP (last 3 results) No results for input(s): PROBNP in the  last 8760 hours. HbA1C: Recent Labs    12/14/18 0400  HGBA1C 5.1   CBG: No results for input(s): GLUCAP in the last 168 hours. Lipid Profile: Recent Labs    12/14/18 0400  CHOL 185  HDL 52  LDLCALC 122*  TRIG 56  CHOLHDL 3.6   Thyroid Function Tests: Recent Labs    12/13/18 1312  TSH 0.979   Anemia Panel: No results for input(s): VITAMINB12, FOLATE, FERRITIN, TIBC, IRON, RETICCTPCT in the last 72 hours. Sepsis Labs: No results for input(s): PROCALCITON, LATICACIDVEN in the last 168 hours.  Recent Results (from the past  240 hour(s))  SARS CORONAVIRUS 2 Nasal Swab Aptima Multi Swab     Status: None   Collection Time: 12/13/18  7:27 AM   Specimen: Aptima Multi Swab; Nasal Swab  Result Value Ref Range Status   SARS Coronavirus 2 NEGATIVE NEGATIVE Final    Comment: (NOTE) SARS-CoV-2 target nucleic acids are NOT DETECTED. The SARS-CoV-2 RNA is generally detectable in upper and lower respiratory specimens during the acute phase of infection. Negative results do not preclude SARS-CoV-2 infection, do not rule out co-infections with other pathogens, and should not be used as the sole basis for treatment or other patient management decisions. Negative results must be combined with clinical observations, patient history, and epidemiological information. The expected result is Negative. Fact Sheet for Patients: SugarRoll.be Fact Sheet for Healthcare Providers: https://www.woods-mathews.com/ This test is not yet approved or cleared by the [place] FDA and  has been authorized for detection and/or diagnosis of SARS-CoV-2 by FDA under an Emergency Use Authorization (EUA). This EUA will remain  in effect (meaning this test can be used) for the duration of the COVID-19 declaration under Section 56 4(b)(1) of the Act, 21 U.S.C. section 360bbb-3(b)(1), unless the authorization is terminated or revoked sooner. Performed at [facility], [place] [address] [place], [place] [address]          Radiology Studies: Mr Jeri Cos Contrast  Result Date: 12/13/2018 CLINICAL DATA:  Multiple sclerosis.  New neurologic event. EXAM: MRI HEAD WITH CONTRAST TECHNIQUE: Multiplanar, multiecho pulse sequences of the brain and surrounding structures were obtained with intravenous contrast. CONTRAST:  8 mL Gadovist IV COMPARISON:  MRI brain without contrast or earlier today FINDINGS: White matter lesion in the corona radiata on the left shows mild patchy enhancement. This area showed  increased signal on diffusion-weighted imaging. Additional lesions in the periventricular white matter bilaterally are strongly suspicious for multiple sclerosis. No lesions in the posterior fossa Ventricle size normal.  No other enhancing lesions. IMPRESSION: Periventricular white matter lesions are present bilaterally, highly suspicious for multiple sclerosis. Patchy enhancement in a lesion in the left corona radiata most consistent with active demyelinization. Electronically Signed   By: Franchot Gallo M.D.   On: 12/13/2018 19:50   Mr Cervical Spine W Wo Contrast  Result Date: 12/13/2018 CLINICAL DATA:  Slurred speech and suspected demyelinating disease. EXAM: MRI CERVICAL SPINE WITHOUT AND WITH CONTRAST TECHNIQUE: Multiplanar and multiecho pulse sequences of the cervical spine, to include the craniocervical junction and cervicothoracic junction, were obtained without and with intravenous contrast. CONTRAST:  10 mL Gadavist COMPARISON:  Brain MRI 12/13/2018 FINDINGS: Alignment: Normal Vertebrae: No acute compression fracture, discitis-osteomyelitis, facet edema or other focal marrow lesion. No epidural collection. Cord: Normal signal and morphology. No contrast enhancement. Posterior Fossa, vertebral arteries, paraspinal tissues: Visualized posterior fossa is normal. Vertebral artery flow voids are preserved. No prevertebral soft tissue swelling. Disc levels: The  intervertebral disc spaces from C2-T1 are normal. T1-2: Small right subarticular disc protrusion narrows the ventral thecal sac and indents the right anterior spinal cord. But there is no spinal canal or neural foraminal stenosis. IMPRESSION: 1. No evidence of demyelinating disease within the cervical spine. 2. Small T1-2 disc protrusion without spinal canal or neural foraminal stenosis. Electronically Signed   By: Deatra Robinson M.D.   On: 12/13/2018 19:33        Scheduled Meds: . enoxaparin (LOVENOX) injection  40 mg Subcutaneous Q24H  .  pantoprazole (PROTONIX) IV  40 mg Intravenous Q24H   Continuous Infusions:    LOS: 2 days     Jacquelin Hawking, MD [facility] 12/15/2018, 1:54 PM  If 7PM-7AM, please contact night-coverage [email or web address]

## 2018-12-15 NOTE — Progress Notes (Signed)
  Speech Language Pathology Treatment: Cognitive-Linquistic  Patient Details Name: Ann [name] MRN: [id number] DOB: [birth year] Today's Date: 12/15/2018 Time: 4010-2725 SLP Time Calculation (min) (ACUTE ONLY): 38 min  Assessment / Plan / Recommendation Clinical Impression  Pt was seen for treatment and was cooperative throughout the session. She was able to recall three of the four compensatory strategies for speech intelligibility provided by this SLP yesterday as well as some swallowing compensatory strategies. She continues to report difficulty with saliva loss towards the right and anterior spillage of some foods. Labial strengthening and ROM exercises were completed with min-mod cues. She used compensatory strategies for speech intelligibility at the 8-9 word sentence level with 80% accuracy increasing to 100% with min. cues. Mod cues were needed during conversation. She completed a 3-task mental manipulation sequencing activity with 100% accuracy but achieved 60% accuracy with 4 tasks increasing to 100% with min-mod cues. She recalled concrete information from voice mails with 80% accuracy increasing to 100% with min. cues and was able to draw inferences related to voice mails with 100% accuracy. She completed an executive function calendar activity with 80% accuracy increasing to 100% with min. cues. SLP will continue to follow pt.    HPI HPI: Pt is a 38 y.o. female with medical history significant of Von Willebrand disease, and depression/anxiety who presented with slurred speech. MRI of the brain revealed a signal abnormality in the left corona radiata shows weak diffusion restriction favoring active demyelination. Multiple sclerosis was suspected.       SLP Plan  Continue with current plan of care       Recommendations                   Follow up Recommendations: Outpatient SLP SLP Visit Diagnosis: Dysarthria and anarthria (R47.1);Cognitive communication deficit  (R41.841) Plan: Continue with current plan of care       Cambren Helm I. Hardin Negus, Theresa, [place] Office number [phone] Pager [place] 12/15/2018, 3:16 PM

## 2018-12-15 NOTE — Progress Notes (Signed)
Educated pt and spouse on medications and adjusting to new diagnosis MS.

## 2018-12-15 NOTE — Progress Notes (Signed)
Subjective: No changes  Exam: Vitals:   12/15/18 0753 12/15/18 1202  BP: 121/78 (!) 146/89  Pulse: 86 80  Resp: 20 20  Temp: 98.1 F (36.7 C) 97.8 F (36.6 C)  SpO2: 99% 99%   Gen: In bed, NAD Resp: non-labored breathing, no acute distress Abd: soft, nt  Neuro: MS: awake, alert, interactive and appropriate CN: Right pupil is smaller than left, and there does appear to be an afferent pupillary defect on the right, EOMI Motor: 5/5 throughout, mildly impaired fine motor of right hand.  Sensory:intact to LT   Impression: 38 yo F with new facial weakness and MRI consistent with MS. From description of her visual change that she had several years ago, I do wonder if this represents optic neuritis, though I do not have access to the ophthalmologic records.  In any case, at the current time I would continue IV Solu-Medrol given that her MRI is very consistent with multiple sclerosis with enhancing and nonenhancing lesions.  Recommendations: 1) continue IV Solu-Medrol for total 5 days.  Roland Rack, MD [facility] [phone]  If 7pm- 7am, please page neurology on call as listed in [place].

## 2018-12-15 NOTE — Progress Notes (Signed)
CBG 149, She noted her "speech sometimes become more slurred at times and  tongue feels heavy during the time her speech becomes more slurred"

## 2018-12-15 NOTE — TOC Initial Note (Signed)
Transition of Care [facility]) - Initial/Assessment Note    Patient Details  Name: Ann [name] MRN: [id number] Date of Birth: 1980-07-02  Transition of Care [facility]) CM/SW Contact:    Pollie Friar, RN Phone Number: 12/15/2018, 1:24 PM  Clinical Narrative:                 Pt with recommendations for outpatient therapy. Pt would like to attend at [facility]. Orders in Epic and information on the AVS. TOC following for further d/c needs.  Expected Discharge Plan: OP Rehab Barriers to Discharge: Continued Medical Work up   Patient Goals and CMS Choice     Choice offered to / list presented to : Patient  Expected Discharge Plan and Services Expected Discharge Plan: OP Rehab   Discharge Planning Services: CM Consult     Expected Discharge Date: 12/20/18                                    Prior Living Arrangements/Services   Lives with:: Spouse Patient language and need for interpreter reviewed:: Yes(no needs) Do you feel safe going back to the place where you live?: Yes      Need for Family Participation in Patient Care: No (Comment) Care giver support system in place?: No (comment)   Criminal Activity/Legal Involvement Pertinent to Current Situation/Hospitalization: No - Comment as needed  Activities of Daily Living Home Assistive Devices/Equipment: None ADL Screening (condition at time of admission) Patient's cognitive ability adequate to safely complete daily activities?: Yes Is the patient deaf or have difficulty hearing?: No Does the patient have difficulty seeing, even when wearing glasses/contacts?: No Does the patient have difficulty concentrating, remembering, or making decisions?: No Patient able to express need for assistance with ADLs?: Yes Does the patient have difficulty dressing or bathing?: No Independently performs ADLs?: Yes (appropriate for developmental age) Does the patient have difficulty walking or climbing stairs?:  No Weakness of Legs: Both Weakness of Arms/Hands: None  Permission Sought/Granted                  Emotional Assessment Appearance:: Appears stated age Attitude/Demeanor/Rapport: Engaged Affect (typically observed): Accepting, Pleasant Orientation: : Oriented to Self, Oriented to Place, Oriented to  Time, Oriented to Situation   Psych Involvement: No (comment)  Admission diagnosis:  Multiple sclerosis exacerbation ([place]) [G35] Patient Active Problem List   Diagnosis Date Noted  . Slurred speech 12/13/2018  . Anxiety 12/13/2018  . Breast hypertrophy in female 02/27/2015  . Hyponatremia 03/04/2012  . Acute cholecystitis 03/02/2012  . Morbid obesity ([place]) 08/25/2011  . Von Willebrand disease - hx PPH 04/14/2011  . History of abnormal Pap smear - had colpo 04/14/2011  . Infertility, female 04/14/2011  . HSV-2 infection - history, no outbreaks 04/14/2011  . Anemia 04/14/2011  . Asthma - uses inhaler PRN 04/14/2011  . History of pyelonephritis - frequent history as a child 04/14/2011  . Migraine 04/14/2011  . History of depression 04/14/2011  . History of smoking 04/14/2011   PCP:  Simona Huh, NP Pharmacy:   [facility] [place], [place] [address] Phone: [phone] Fax: [phone]     Social Determinants of Health (SDOH) Interventions    Readmission Risk Interventions No flowsheet data found.

## 2018-12-15 NOTE — Plan of Care (Signed)
Progressing towards goals

## 2018-12-15 NOTE — Plan of Care (Signed)
  Problem: Education: Goal: Knowledge of General Education information will improve Description: Including pain rating scale, medication(s)/side effects and non-pharmacologic comfort measures Outcome: Progressing   Problem: Health Behavior/Discharge Planning: Goal: Ability to manage health-related needs will improve Outcome: Progressing   Problem: Clinical Measurements: Goal: Ability to maintain clinical measurements within normal limits will improve Outcome: Progressing Goal: Will remain free from infection Outcome: Progressing Goal: Diagnostic test results will improve Outcome: Progressing Goal: Respiratory complications will improve Outcome: Progressing Goal: Cardiovascular complication will be avoided Outcome: Progressing   Problem: Activity: Goal: Risk for activity intolerance will decrease Outcome: Progressing   Problem: Nutrition: Goal: Adequate nutrition will be maintained Outcome: Progressing   Problem: Coping: Goal: Level of anxiety will decrease Outcome: Progressing   Problem: Nutrition: Goal: Adequate nutrition will be maintained Outcome: Progressing   Problem: Coping: Goal: Level of anxiety will decrease Outcome: Progressing   Problem: Elimination: Goal: Will not experience complications related to bowel motility Outcome: Progressing Goal: Will not experience complications related to urinary retention Outcome: Progressing   Problem: Pain Managment: Goal: General experience of comfort will improve Outcome: Progressing   Problem: Safety: Goal: Ability to remain free from injury will improve Outcome: Progressing   Problem: Skin Integrity: Goal: Risk for impaired skin integrity will decrease Outcome: Progressing   Problem: Education: Goal: Knowledge of disease or condition will improve Outcome: Progressing Goal: Knowledge of secondary prevention will improve Outcome: Progressing Goal: Knowledge of patient specific risk factors addressed and  post discharge goals established will improve Outcome: Progressing   Problem: Coping: Goal: Will verbalize positive feelings about self Outcome: Progressing Goal: Will identify appropriate support needs Outcome: Progressing   Problem: Health Behavior/Discharge Planning: Goal: Ability to manage health-related needs will improve Outcome: Progressing   Problem: Ischemic Stroke/TIA Tissue Perfusion: Goal: Complications of ischemic stroke/TIA will be minimized Outcome: Progressing

## 2018-12-16 ENCOUNTER — Other Ambulatory Visit: Payer: Self-pay

## 2018-12-16 MED ORDER — SODIUM CHLORIDE 0.9 % IV SOLN
1000.0000 mg | Freq: Every day | INTRAVENOUS | Status: AC
  Administered 2018-12-16 – 2018-12-17 (×2): 1000 mg via INTRAVENOUS
  Filled 2018-12-16 (×2): qty 8

## 2018-12-16 MED ORDER — TRAMADOL HCL 50 MG PO TABS
100.0000 mg | ORAL_TABLET | Freq: Four times a day (QID) | ORAL | Status: DC | PRN
  Administered 2018-12-16: 20:00:00 100 mg via ORAL
  Filled 2018-12-16: qty 2

## 2018-12-16 NOTE — Progress Notes (Signed)
Patient's stated that her teeth are clamped down and that her jaw is locked and her face feels tight.  RN contacted neurologist on call  Advised to give benadryl.  RN will continue to monitor the patient

## 2018-12-16 NOTE — Plan of Care (Signed)
Progressing towards goals

## 2018-12-16 NOTE — Progress Notes (Addendum)
Subjective: Reports improved headache. She had jaw clenching after compazine, but this improved with benadryl. Some waxing/waning of dysarthria.   Exam: Vitals:   12/16/18 0428 12/16/18 0723  BP: (!) 146/86 139/81  Pulse: (!) 56 (!) 56  Resp: 18 16  Temp: 97.9 F (36.6 C) 98 F (36.7 C)  SpO2: 97% 97%   Gen: In bed, NAD Resp: non-labored breathing, no acute distress Abd: soft, nt  Neuro: MS: awake, alert, interactive and appropriate CN: Right pupil is smaller than left, and there does appear to be an afferent pupillary defect on the right, EOMI, right facial weakness with slight improvement  Motor: 5/5 throughout, mildly impaired fine motor of right hand.  Sensory:intact to LT   Impression: 38 yo F with new facial weakness and MRI consistent with MS. From description of her visual change that she had several years ago, I do wonder if this represents optic neuritis, though I do not have access to the ophthalmologic records.  In any case, at the current time I would continue IV Solu-Medrol given that her MRI is very consistent with multiple sclerosis with enhancing and nonenhancing lesions.  She had a dystonic reaction to compazine last night, but this has improved.   HEr waxing/waning could be due to fatigue, also I have noticed with migraine occasionally people will get recrudescence and wonder if this could be something similar, will continue to monitor.   Recommendations: 1) continue IV Solu-Medrol for total 5 days.   Roland Rack, MD [facility] [phone]  If 7pm- 7am, please page neurology on call as listed in [place].

## 2018-12-16 NOTE — Progress Notes (Signed)
PROGRESS NOTE    Ann [name]  MOL:[id number] DOB: 1981/03/31 DOA: 12/12/2018 PCP: Courtney Paris, NP   Brief Narrative: Ann [name] is a 38 y.o. female with medical history significant of Von Willebrand disease; and depression/anxiety. She presented with a slurred speech and found to have evidence of multiple sclerosis.   Assessment & Plan:   Principal Problem:   Slurred speech Active Problems:   Von Willebrand disease - hx PPH   Morbid obesity ([facility])   Anxiety   Multiple sclerosis MRI suggestive of diagnosis. Started on IV steroids. Still with right sided facial droop right arm weakness. Started on IV steroids for treatment of acute MS with plans for 5 day treatment -Neurology recommendations: IV steroids  Headache History of migraines. Improved with compazine/toradol. Possibly made worse secondary to steroids -Start Tramadol prn  Insomnia -Ambien 5 mg prn -Benadryl PO prn  Morbid obesity BMI not available at this time.  Anxiety -Continue Xanax prn  Mouth swelling ?Trismus. Occurred overnight. Possibly secondary to compazine. Improved with Benadryl -Discontinue compazine   DVT prophylaxis: Lovenox Code Status:   Code Status: Full Code Family Communication: none at bedside Disposition Plan: Discharge likely in 24 hours per neurology recommendations   Consultants:   Neurology  Procedures:   None  Antimicrobials:  None    Subjective: Episode overnight where her mouth swelled up and developed tightness and difficulty in opening her mouth. Given Benadryl which helped.  Objective: Vitals:   12/16/18 1050 12/16/18 1051 12/16/18 1211 12/16/18 1619  BP: 139/81 139/81 (!) 143/72 (!) 145/74  Pulse: 62 62 (!) 51 (!) 56  Resp: 16 16 18 16   Temp: 98.1 F (36.7 C) 98.1 F (36.7 C) (!) 97.5 F (36.4 C) 98.4 F (36.9 C)  TempSrc: Oral Oral Oral Oral  SpO2:   99% 99%  Weight:  100.2 kg    Height:  5\' 2"  (1.575 m)      Intake/Output  Summary (Last 24 hours) at 12/16/2018 1629 Last data filed at 12/16/2018 1500 Gross per 24 hour  Intake 538 ml  Output -  Net 538 ml   Filed Weights   12/16/18 1051  Weight: 100.2 kg    Examination:  General exam: Appears calm and comfortable  Respiratory system: Clear to auscultation. Respiratory effort normal. Cardiovascular system: S1 & S2 heard, RRR. No murmurs, rubs, gallops or clicks. Gastrointestinal system: Abdomen is nondistended, soft and nontender. No organomegaly or masses felt. Normal bowel sounds heard. Central nervous system: Alert and oriented. Right facial droop Extremities: No edema. No calf tenderness Skin: No cyanosis. No rashes Psychiatry: Judgement and insight appear normal. Mood & affect appropriate.     Data Reviewed: I have personally reviewed following labs and imaging studies  CBC: Recent Labs  Lab 12/12/18 1739 12/12/18 1809  WBC 9.1  --   NEUTROABS 5.1  --   HGB 13.4 14.3  HCT 41.6 42.0  MCV 97.2  --   PLT 341  --    Basic Metabolic Panel: Recent Labs  Lab 12/12/18 1739 12/12/18 1809  NA 139 141  K 3.7 3.7  CL 106 106  CO2 23  --   GLUCOSE 94 90  BUN 7 7  CREATININE 0.87 0.80  CALCIUM 9.5  --    GFR: Estimated Creatinine Clearance: 103.4 mL/min (by C-G formula based on SCr of 0.8 mg/dL). Liver Function Tests: Recent Labs  Lab 12/12/18 1739  AST 24  ALT 24  ALKPHOS 54  BILITOT 0.6  PROT  7.6  ALBUMIN 4.3   No results for input(s): LIPASE, AMYLASE in the last 168 hours. No results for input(s): AMMONIA in the last 168 hours. Coagulation Profile: Recent Labs  Lab 12/12/18 1739  INR 1.1   Cardiac Enzymes: No results for input(s): CKTOTAL, CKMB, CKMBINDEX, TROPONINI in the last 168 hours. BNP (last 3 results) No results for input(s): PROBNP in the last 8760 hours. HbA1C: Recent Labs    12/14/18 0400  HGBA1C 5.1   CBG: Recent Labs  Lab 12/15/18 1931  GLUCAP 149*   Lipid Profile: Recent Labs    12/14/18 0400   CHOL 185  HDL 52  LDLCALC 122*  TRIG 56  CHOLHDL 3.6   Thyroid Function Tests: No results for input(s): TSH, T4TOTAL, FREET4, T3FREE, THYROIDAB in the last 72 hours. Anemia Panel: No results for input(s): VITAMINB12, FOLATE, FERRITIN, TIBC, IRON, RETICCTPCT in the last 72 hours. Sepsis Labs: No results for input(s): PROCALCITON, LATICACIDVEN in the last 168 hours.  Recent Results (from the past 240 hour(s))  SARS CORONAVIRUS 2 Nasal Swab Aptima Multi Swab     Status: None   Collection Time: 12/13/18  7:27 AM   Specimen: Aptima Multi Swab; Nasal Swab  Result Value Ref Range Status   SARS Coronavirus 2 NEGATIVE NEGATIVE Final    Comment: (NOTE) SARS-CoV-2 target nucleic acids are NOT DETECTED. The SARS-CoV-2 RNA is generally detectable in upper and lower respiratory specimens during the acute phase of infection. Negative results do not preclude SARS-CoV-2 infection, do not rule out co-infections with other pathogens, and should not be used as the sole basis for treatment or other patient management decisions. Negative results must be combined with clinical observations, patient history, and epidemiological information. The expected result is Negative. Fact Sheet for Patients: SugarRoll.be Fact Sheet for Healthcare Providers: https://www.woods-mathews.com/ This test is not yet approved or cleared by the [place] FDA and  has been authorized for detection and/or diagnosis of SARS-CoV-2 by FDA under an Emergency Use Authorization (EUA). This EUA will remain  in effect (meaning this test can be used) for the duration of the COVID-19 declaration under Section 56 4(b)(1) of the Act, 21 U.S.C. section 360bbb-3(b)(1), unless the authorization is terminated or revoked sooner. Performed at [facility], [place] [address], [place], [place] [address]          Radiology Studies: No results found.      Scheduled Meds: .  enoxaparin (LOVENOX) injection  40 mg Subcutaneous Q24H  . pantoprazole  40 mg Oral Daily   Continuous Infusions: . methylPREDNISolone (SOLU-MEDROL) injection 1,000 mg (12/16/18 1313)     LOS: 3 days     Cordelia Poche, MD [facility] 12/16/2018, 4:29 PM  If 7PM-7AM, please contact night-coverage [email or web address]

## 2018-12-17 MED ORDER — TRAMADOL HCL 50 MG PO TABS: 100.0000 mg | ORAL_TABLET | Freq: Every day | ORAL | 0 refills | Status: DC | PRN

## 2018-12-17 MED ORDER — VITAMIN D3 25 MCG (1000 UNIT) PO TABS: 1000.0000 [IU] | ORAL_TABLET | Freq: Every day | ORAL | 0 refills | Status: DC

## 2018-12-17 NOTE — Plan of Care (Signed)
Progressing towards goals

## 2018-12-17 NOTE — Plan of Care (Signed)
Plan of care adequate for discharge.

## 2018-12-17 NOTE — Discharge Summary (Signed)
Physician Discharge Summary  Ann [name] JZP:[id number] DOB: [birth year] DOA: 12/12/2018  PCP: Ann Paris, NP  Admit date: 12/12/2018 Discharge date: 12/17/2018  Admitted From: Home Disposition: Home  Recommendations for Outpatient Follow-up:  1. Follow up with PCP in 1 week 2. Follow up with neurology 3. Please follow up on the following pending results: None  Home Health: Outpatient speech therapy Equipment/Devices: None  Discharge Condition: Stable CODE STATUS: Full code Diet recommendation: Heart healthy   Brief/Interim Summary:  Admission HPI written by Jonah Blue, MD   Chief Complaint: Slurred speech  HPI: Ann [name] is a 38 y.o. female with medical history significant of Von Willebrand disease; and depression/anxiety presenting with slurred speech.  On Sunday, she noticed her speech was slurred.  She stumbled some.  The speech worsened Monday and Tuesday.  No visual disturbance.  No N/W/T of arms or legs.  No facial droop.  Right before she came here, her husband noticed her face was drooping a little.  No history of neurologic problems prior.     ED Course:  Slurred speech since 8/3.  Yesterday, she had difficulty signing her signature.  Also more clumsy and dropping things with R hand.  Headache, given migraine cocktail.  MRI concerning for MS.  Seen by neurology - MRI brain and C-spine ordered.  COVID pending.  Hospital course:  Likely ultiple sclerosis Slurred speech MRI suggestive of diagnosis. Still with right sided facial droop right arm weakness. Started on IV steroids for treatment of acute MS with treatment duration of 5 days with some mild improvement of symptoms (right facial droop) and good improvement of right sided weakness. Outpatient neurology follow-up and speech therapy follow-up  Headache History of migraines. Improved with compazine/toradol. Possibly made worse secondary to steroids. Discharged with Tramadol prn x5  days.  Insomnia Treated with Ambien and Benadryl prn.  Morbid obesity Body mass index is 40.4 kg/m.   Vitamin D deficiency Discharged on vitamin D supplementation  Elevated LDL Consider statin therapy based on patient risk factors versus diet modification. Can be managed as an outpatient. No stroke on MRI.  Anxiety Continue Xanax prn  Dystonic reaction Secondary to compazine. Resolved with benadryl. Compazine discontinued.  Discharge Diagnoses:  Principal Problem:   Slurred speech Active Problems:   Von Willebrand disease - hx PPH   Morbid obesity (HCC)   Anxiety    Discharge Instructions  Discharge Instructions    Ambulatory referral to Neurology   Complete by: As directed    An appointment is requested in approximately: 2 weeks   Ambulatory referral to Speech Therapy   Complete by: As directed    Call MD for:  difficulty breathing, headache or visual disturbances   Complete by: As directed    Call MD for:  extreme fatigue   Complete by: As directed    Call MD for:  persistant dizziness or light-headedness   Complete by: As directed      Allergies as of 12/17/2018      Reactions   Darvocet [propoxyphene N-acetaminophen] Nausea And Vomiting   Codeine Nausea And Vomiting, Rash   Sulfa Antibiotics Rash      Medication List    STOP taking these medications   ibuprofen 200 MG tablet Commonly known as: ADVIL     TAKE these medications   ALPRAZolam 1 MG tablet Commonly known as: XANAX Take 0.5 mg by mouth 2 (two) times daily as needed for anxiety.   etodolac 400 MG 24 hr  tablet Commonly known as: LODINE XL Take 400 mg by mouth 2 (two) times daily as needed for pain.   meclizine 25 MG tablet Commonly known as: ANTIVERT Take 25 mg by mouth 3 (three) times daily as needed for dizziness.   traMADol 50 MG tablet Commonly known as: ULTRAM Take 2 tablets (100 mg total) by mouth daily as needed for severe pain (headache).      Follow-up Information     [facility]   Specialty: Rehabilitation Why: The outpatient rehab will contact you for the first appointment. Contact information: [address] [id number] [place] [id number] [phone]       Sater, Pearletha Furlichard A, MD. Schedule an appointment as soon as possible for a visit.   Specialty: Neurology Contact information: [address] [place] [address] [phone]        Ann ParisMcCoy, Rachel, NP. Schedule an appointment as soon as possible for a visit in 1 week(s).   Specialty: Nurse Practitioner Why: Hospital follow-up Contact information: [address] [place] [address] [phone]          Allergies  Allergen Reactions   Darvocet [Propoxyphene N-Acetaminophen] Nausea And Vomiting   Codeine Nausea And Vomiting and Rash   Sulfa Antibiotics Rash    Consultations:  Neurology   Procedures/Studies: Ct Head Wo Contrast  Result Date: 12/12/2018 CLINICAL DATA:  Episodes of slurred speech, loss of balance. EXAM: CT HEAD WITHOUT CONTRAST TECHNIQUE: Contiguous axial images were obtained from the base of the skull through the vertex without intravenous contrast. COMPARISON:  May 05, 2014 FINDINGS: Brain: No evidence of acute infarction, hemorrhage, hydrocephalus, extra-axial collection or mass lesion/mass effect. Vascular: No hyperdense vessel or unexpected calcification. Skull: Normal. Negative for fracture or focal lesion. Sinuses/Orbits: No acute finding. Other: None. IMPRESSION: No acute intracranial abnormality. Electronically Signed   By: Ted Mcalpineobrinka  Dimitrova M.D.   On: 12/12/2018 19:08   Mr Brain Wo Contrast  Result Date: 12/13/2018 CLINICAL DATA:  Subacute neuro deficit. Slurred speech and difficulty with balance EXAM: MRI HEAD WITHOUT CONTRAST TECHNIQUE: Multiplanar, multiecho pulse sequences of the brain and surrounding structures were obtained without intravenous contrast.  COMPARISON:  Head CT from yesterday FINDINGS: Brain: Ovoid area of weakly restricted diffusion in the left corona radiata measuring 12 mm. There are ovoid periventricular FLAIR hyperintensities in the cerebral white matter. No definite juxta cortical or infratentorial signal abnormality. No hemorrhage, hydrocephalus, or masslike finding. Normal brain volume Vascular: Major flow voids are preserved Skull and upper cervical spine: Negative for marrow lesion Sinuses/Orbits: Negative IMPRESSION: Suspect multiple sclerosis. A signal abnormality in the left corona radiata shows weak diffusion restriction favoring active demyelination. The main differential would be premature chronic small vessel ischemia with subacute infarct. Electronically Signed   By: Marnee SpringJonathon  Watts M.D.   On: 12/13/2018 06:13   Mr Brain W Contrast  Result Date: 12/13/2018 CLINICAL DATA:  Multiple sclerosis.  New neurologic event. EXAM: MRI HEAD WITH CONTRAST TECHNIQUE: Multiplanar, multiecho pulse sequences of the brain and surrounding structures were obtained with intravenous contrast. CONTRAST:  8 mL Gadovist IV COMPARISON:  MRI brain without contrast or earlier today FINDINGS: White matter lesion in the corona radiata on the left shows mild patchy enhancement. This area showed increased signal on diffusion-weighted imaging. Additional lesions in the periventricular white matter bilaterally are strongly suspicious for multiple sclerosis. No lesions in the posterior fossa Ventricle size normal.  No other enhancing lesions. IMPRESSION: Periventricular white matter lesions are present bilaterally,  highly suspicious for multiple sclerosis. Patchy enhancement in a lesion in the left corona radiata most consistent with active demyelinization. Electronically Signed   By: Franchot Gallo M.D.   On: 12/13/2018 19:50   Mr Cervical Spine W Wo Contrast  Result Date: 12/13/2018 CLINICAL DATA:  Slurred speech and suspected demyelinating disease. EXAM: MRI  CERVICAL SPINE WITHOUT AND WITH CONTRAST TECHNIQUE: Multiplanar and multiecho pulse sequences of the cervical spine, to include the craniocervical junction and cervicothoracic junction, were obtained without and with intravenous contrast. CONTRAST:  10 mL Gadavist COMPARISON:  Brain MRI 12/13/2018 FINDINGS: Alignment: Normal Vertebrae: No acute compression fracture, discitis-osteomyelitis, facet edema or other focal marrow lesion. No epidural collection. Cord: Normal signal and morphology. No contrast enhancement. Posterior Fossa, vertebral arteries, paraspinal tissues: Visualized posterior fossa is normal. Vertebral artery flow voids are preserved. No prevertebral soft tissue swelling. Disc levels: The intervertebral disc spaces from C2-T1 are normal. T1-2: Small right subarticular disc protrusion narrows the ventral thecal sac and indents the right anterior spinal cord. But there is no spinal canal or neural foraminal stenosis. IMPRESSION: 1. No evidence of demyelinating disease within the cervical spine. 2. Small T1-2 disc protrusion without spinal canal or neural foraminal stenosis. Electronically Signed   By: Ulyses Jarred M.D.   On: 12/13/2018 19:33      Subjective: No issues this morning. She has noticed that facial droop is slightly improved.  Discharge Exam: Vitals:   12/17/18 0821 12/17/18 1143  BP: (!) 144/71 (!) 157/86  Pulse: (!) 50 (!) 51  Resp: 16 18  Temp: 98.9 F (37.2 C) 97.8 F (36.6 C)  SpO2: 98% 98%   Vitals:   12/16/18 2350 12/17/18 0501 12/17/18 0821 12/17/18 1143  BP: (!) 157/78 (!) 153/80 (!) 144/71 (!) 157/86  Pulse: (!) 50 (!) 48 (!) 50 (!) 51  Resp: 14 17 16 18   Temp: 98 F (36.7 C) 98.7 F (37.1 C) 98.9 F (37.2 C) 97.8 F (36.6 C)  TempSrc: Oral Oral Oral Oral  SpO2: 98% 98% 98% 98%  Weight:      Height:        General: Pt is alert, awake, not in acute distress Cardiovascular: RRR, S1/S2 +, no rubs, no gallops Respiratory: CTA bilaterally, no  wheezing, no rhonchi Abdominal: Soft, NT, ND, bowel sounds + Extremities: no edema, no cyanosis Neuro: moderate right facial droop, 5/5 strength bilaterally    The results of significant diagnostics from this hospitalization (including imaging, microbiology, ancillary and laboratory) are listed below for reference.     Microbiology: Recent Results (from the past 240 hour(s))  SARS CORONAVIRUS 2 Nasal Swab Aptima Multi Swab     Status: None   Collection Time: 12/13/18  7:27 AM   Specimen: Aptima Multi Swab; Nasal Swab  Result Value Ref Range Status   SARS Coronavirus 2 NEGATIVE NEGATIVE Final    Comment: (NOTE) SARS-CoV-2 target nucleic acids are NOT DETECTED. The SARS-CoV-2 RNA is generally detectable in upper and lower respiratory specimens during the acute phase of infection. Negative results do not preclude SARS-CoV-2 infection, do not rule out co-infections with other pathogens, and should not be used as the sole basis for treatment or other patient management decisions. Negative results must be combined with clinical observations, patient history, and epidemiological information. The expected result is Negative. Fact Sheet for Patients: SugarRoll.be Fact Sheet for Healthcare Providers: https://www.woods-mathews.com/ This test is not yet approved or cleared by the [place] FDA and  has been authorized for detection  and/or diagnosis of SARS-CoV-2 by FDA under an Emergency Use Authorization (EUA). This EUA will remain  in effect (meaning this test can be used) for the duration of the COVID-19 declaration under Section 56 4(b)(1) of the Act, 21 U.S.C. section 360bbb-3(b)(1), unless the authorization is terminated or revoked sooner. Performed at [facility], [address] [place], [place] [address]      Labs: BNP (last 3 results) No results for input(s): BNP in the last 8760 hours. Basic Metabolic Panel: Recent Labs   Lab 12/12/18 1739 12/12/18 1809  NA 139 141  K 3.7 3.7  CL 106 106  CO2 23  --   GLUCOSE 94 90  BUN 7 7  CREATININE 0.87 0.80  CALCIUM 9.5  --    Liver Function Tests: Recent Labs  Lab 12/12/18 1739  AST 24  ALT 24  ALKPHOS 54  BILITOT 0.6  PROT 7.6  ALBUMIN 4.3   No results for input(s): LIPASE, AMYLASE in the last 168 hours. No results for input(s): AMMONIA in the last 168 hours. CBC: Recent Labs  Lab 12/12/18 1739 12/12/18 1809  WBC 9.1  --   NEUTROABS 5.1  --   HGB 13.4 14.3  HCT 41.6 42.0  MCV 97.2  --   PLT 341  --    Cardiac Enzymes: No results for input(s): CKTOTAL, CKMB, CKMBINDEX, TROPONINI in the last 168 hours. BNP: Invalid input(s): POCBNP CBG: Recent Labs  Lab 12/15/18 1931  GLUCAP 149*   D-Dimer No results for input(s): DDIMER in the last 72 hours. Hgb A1c No results for input(s): HGBA1C in the last 72 hours. Lipid Profile No results for input(s): CHOL, HDL, LDLCALC, TRIG, CHOLHDL, LDLDIRECT in the last 72 hours. Thyroid function studies No results for input(s): TSH, T4TOTAL, T3FREE, THYROIDAB in the last 72 hours.  Invalid input(s): FREET3 Anemia work up No results for input(s): VITAMINB12, FOLATE, FERRITIN, TIBC, IRON, RETICCTPCT in the last 72 hours. Urinalysis    Component Value Date/Time   COLORURINE YELLOW 04/20/2011 0040   APPEARANCEUR CLEAR 04/20/2011 0040   LABSPEC 1.010 04/20/2011 0040   PHURINE 6.0 04/20/2011 0040   GLUCOSEU NEGATIVE 04/20/2011 0040   HGBUR NEGATIVE 04/20/2011 0040   BILIRUBINUR neg 09/20/2011 1609   KETONESUR 40 (A) 04/20/2011 0040   PROTEINUR 1+ 09/20/2011 1609   PROTEINUR NEGATIVE 04/20/2011 0040   UROBILINOGEN 0.2 09/20/2011 1609   UROBILINOGEN 0.2 04/20/2011 0040   NITRITE neg 09/20/2011 1609   NITRITE NEGATIVE 04/20/2011 0040   LEUKOCYTESUR Trace 09/20/2011 1609   Sepsis Labs Invalid input(s): PROCALCITONIN,  WBC,  LACTICIDVEN Microbiology Recent Results (from the past 240 hour(s))    SARS CORONAVIRUS 2 Nasal Swab Aptima Multi Swab     Status: None   Collection Time: 12/13/18  7:27 AM   Specimen: Aptima Multi Swab; Nasal Swab  Result Value Ref Range Status   SARS Coronavirus 2 NEGATIVE NEGATIVE Final    Comment: (NOTE) SARS-CoV-2 target nucleic acids are NOT DETECTED. The SARS-CoV-2 RNA is generally detectable in upper and lower respiratory specimens during the acute phase of infection. Negative results do not preclude SARS-CoV-2 infection, do not rule out co-infections with other pathogens, and should not be used as the sole basis for treatment or other patient management decisions. Negative results must be combined with clinical observations, patient history, and epidemiological information. The expected result is Negative. Fact Sheet for Patients: HairSlick.nohttps://www.fda.gov/media/138098/download Fact Sheet for Healthcare Providers: quierodirigir.comhttps://www.fda.gov/media/138095/download This test is not yet approved or cleared by the  [place] FDA and  has been authorized for detection and/or diagnosis of SARS-CoV-2 by FDA under an TEFL teachermergency Use Authorization (EUA). This EUA will remain  in effect (meaning this test can be used) for the duration of the COVID-19 declaration under Section 56 4(b)(1) of the Act, 21 U.S.C. section 360bbb-3(b)(1), unless the authorization is terminated or revoked sooner. Performed at [facility], [address] [place], [place] [address]      Time coordinating discharge: 35 minutes  SIGNED:   Jacquelin Hawkingalph Alean Kromer, MD [facility] 12/17/2018, 2:24 PM

## 2018-12-17 NOTE — Discharge Instructions (Signed)
Ann [name],  You were in the hospital because of right sided weakness and facial droop and found to have multiple sclerosis. You received IV steroids with some improvement to your function. Please follow-up with the speech therapist in addition to the neurologist for continued care. I have provided a short course of pain medication for your headaches as needed.

## 2018-12-17 NOTE — Progress Notes (Signed)
Patient being discharged home.  Patient to be transported by her husband.  IV removed with the catheter intact.  Discharge instructions and prescription information given to the patient who verbalized understanding. 

## 2018-12-17 NOTE — Progress Notes (Signed)
Subjective: Some improvement in swallow and facial weakness.   Exam: Vitals:   12/17/18 0821 12/17/18 1143  BP: (!) 144/71 (!) 157/86  Pulse: (!) 50 (!) 51  Resp: 16 18  Temp: 98.9 F (37.2 C) 97.8 F (36.6 C)  SpO2: 98% 98%   Gen: In bed, NAD Resp: non-labored breathing, no acute distress Abd: soft, nt  Neuro: MS: awake, alert, interactive and appropriate CN: Right pupil is smaller than left, and there does appear to be an afferent pupillary defect on the right, EOMI, right facial weakness with continued improvement.  Motor: 5/5 throughout, mildly impaired fine motor of right hand.  Sensory:intact to LT   Impression: 38 yo F with new facial weakness and MRI consistent with MS. From description of her visual change that she had several years ago, I do wonder if this represents optic neuritis, though I do not have access to the ophthalmologic records.  In any case, at the current time I would continue IV Solu-Medrol given that her MRI is very consistent with multiple sclerosis with enhancing and nonenhancing lesions.  She has now finished 5 days IV solumedrol.  Recommendations: 1) follow-up with outpatient neurology to consider disease modifying therapy  Roland Rack, MD [facility] [phone]  If 7pm- 7am, please page neurology on call as listed in [place].

## 2018-12-21 ENCOUNTER — Other Ambulatory Visit: Payer: Self-pay

## 2018-12-21 ENCOUNTER — Ambulatory Visit: Payer: No Typology Code available for payment source | Attending: Family Medicine

## 2018-12-21 DIAGNOSIS — R1310 Dysphagia, unspecified: Secondary | ICD-10-CM | POA: Diagnosis present

## 2018-12-21 DIAGNOSIS — R471 Dysarthria and anarthria: Secondary | ICD-10-CM | POA: Insufficient documentation

## 2018-12-21 DIAGNOSIS — R41841 Cognitive communication deficit: Secondary | ICD-10-CM | POA: Insufficient documentation

## 2018-12-21 NOTE — Therapy (Signed)
[facility] [address] [place], [place], [address] Phone: [phone]   Fax:  [phone]  Speech Language Pathology Evaluation  Patient Details  Name: Ann [name] MRN: [id number] Date of Birth: 08/08/1980 Referring Provider (SLP): Jacquelin HawkingNettey, Ralph, MD   Encounter Date: 12/21/2018  End of Session - 12/21/18 1347    Visit Number  1    Number of Visits  13    Date for SLP Re-Evaluation  02/15/19    SLP Start Time  1018    SLP Stop Time   1100    SLP Time Calculation (min)  42 min    Activity Tolerance  Patient tolerated treatment well       Past Medical History:  Diagnosis Date  . ADD (attention deficit disorder)    takes Adderall daily  . Anemia   . Anxiety    takes Xanax daily as needed  . Asthma    mild case per pt  . Chronic lower back pain   . Depression    has meds prescribed but doesn't take them  . Family history of adverse reaction to anesthesia    "Mom gets PONV too"  . Headache    "monthly" (02/27/2015)  . Hemorrhoids   . History of esophagogastroduodenoscopy (EGD)   . Migraine    "none in the last 6 months" (02/27/2015)  . Nocturia   . Pneumonia "several times"  . PONV (postoperative nausea and vomiting)   . Von Willebrand disease (HCC)    Dr. Cyndie ChimeGranfortuna    Past Surgical History:  Procedure Laterality Date  . BACK SURGERY    . BREAST CYST EXCISION Left 02/20/2018   Procedure: EXCISION OF LEFT BREAST SEBACEOUS CYST;  Surgeon: Emelia LoronWakefield, Matthew, MD;  Location: [facility];  Service: General;  Laterality: Left;  . BREAST REDUCTION SURGERY Bilateral 02/27/2015   Procedure: BILATERAL BREAST REDUCTION WITH FREE NIPPLE GRAFT TECHNIQUE ON RIGHT BREAST;  Surgeon: Etter Sjogrenavid Bowers, MD;  [facility];  Service: Plastics;  Laterality: Bilateral;  . CESAREAN SECTION  2004; 2013  . CHOLECYSTECTOMY  03/02/2012   Procedure: LAPAROSCOPIC CHOLECYSTECTOMY WITH INTRAOPERATIVE  CHOLANGIOGRAM;  Surgeon: Currie Parishristian J Streck, MD;  Location: [facility] OR;  Service: General;  Laterality: N/A;  . DILATION AND CURETTAGE OF UTERUS  ~ 1997; ~ 2005  . DILITATION & CURRETTAGE/HYSTROSCOPY WITH THERMACHOICE ABLATION  04/28/2012   Procedure: DILATATION & CURETTAGE/HYSTEROSCOPY WITH THERMACHOICE ABLATION;  Surgeon: Jeani HawkingMichelle L Grewal, MD;  Location: [facility];  Service: Gynecology;  Laterality: N/A;  . ERCP  03/03/2012   Procedure: ENDOSCOPIC RETROGRADE CHOLANGIOPANCREATOGRAPHY (ERCP);  Surgeon: Theda BelfastPatrick D Hung, MD;  [facility];  Service: Endoscopy;  Laterality: N/A;  dl/hung  . LAPAROSCOPY  2011  . LUMBAR DISC SURGERY  2003; 2008; 2009  . MULTIPLE TOOTH EXTRACTIONS  "scattered dates"  . POSTERIOR LUMBAR FUSION  2010  . REDUCTION MAMMAPLASTY Bilateral 02/27/2015  . TUBAL LIGATION  2013  . WISDOM TOOTH EXTRACTION  2001    There were no vitals filed for this visit.  Subjective Assessment - 12/21/18 1031    Subjective  Pt rates speech clarity at 6-7/10. Worsens if pt is frustrated.    Currently in Pain?  Yes    Pain Score  8     Pain Location  Head    Pain Descriptors / Indicators  Headache    Pain Type  Acute pain    Pain Onset  Today    Pain Relieving Factors  meds  Effect of Pain on Daily Activities  harder to focus         SLP Evaluation [facility] - 12/21/18 1031      SLP Visit Information   SLP Received On  12/21/18    Referring Provider (SLP)  Cordelia Poche, MD    Onset Date  12-10-18    Medical Diagnosis  ? MS      Subjective   Subjective  Swallowing has been consistently worse since 12-13-18. SLP in hospital told pt to take smaller bites/sips and take more care/be more conscious of eating during POs.    Patient/Family Stated Goal  Improve swallowing and speech skills      General Information   HPI  Pt is a 38 y.o. female with medical history significant of Von Willebrand disease, and depression/anxiety who presented with slurred speech. MRI of the brain revealed  a signal abnormality in the left corona radiata shows weak diffusion restriction favoring active demyelination. Multiple sclerosis was suspected.  Pt has appt with Dr. Felecia Shelling on 12-27-18.      Balance Screen   Has the patient fallen in the past 6 months  Yes    How many times?  near falls - between 5-10 in last 2 weeks    Has the patient had a decrease in activity level because of a fear of falling?   No    Is the patient reluctant to leave their home because of a fear of falling?   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 OT eval     Oral Motor/Sensory Function   Overall Oral Motor/Sensory Function  Impaired    Labial ROM  Within Functional Limits    Labial Symmetry  Within Functional Limits    Labial Strength  Reduced   pt reported, upon pucker/kiss   Labial Coordination  Reduced   pt reported, on pucker/kiss   Lingual ROM  --   decr'd on rt wiht lingual sweep   Lingual Symmetry  Within Functional Limits    Lingual Strength  --   NT due to risk   Lingual Coordination  Reduced   on rt   Facial Symmetry  Within Functional Limits    Overall Oral Motor/Sensory Function  "When I get frustrated my speech gets worse."      Motor Speech   Overall Motor Speech  Impaired    Respiration  Within functional limits    Phonation  Normal    Resonance  Within functional limits     Articulation  Impaired    Level of Impairment  Conversation    Intelligibility  Intelligible    Motor Planning  Witnin functional limits      Individuals Consulted  Consulted and Agree with Results and Recommendations  Patient                        SLP Short Term Goals - 12/21/18 1150      SLP SHORT TERM GOAL #1   Title  pt will tell SLP 2 overt s/sx aspiration and 3 s/sx aspiration PNA with modified independence    Time  3    Period  Weeks    Status  New      SLP SHORT TERM GOAL #2   Title  pt will demo 10 minutes of mod complex conversation using compensations for speech intelligibility, overt three sessions    Time  3    Period  Weeks    Status  New      SLP SHORT TERM GOAL #3   Title  assess pt's cognitive linguistics if deemed clinically appropriate in the first 3 sessions    Time  2    Period  Weeks    Status  New       SLP Long Term Goals - 12/21/18 1344      SLP LONG TERM GOAL #1   Title  pt will engage in 20 minutes mod complex/complex conversation (work-like conversation) with successful use of compensations over three sessions    Time  6    Period  Weeks    Status  New      SLP LONG TERM GOAL #2   Title  pt will complete dysarthria HEP (tongue twisters, etc.) independently in two sessions    Time  6    Period  Weeks    Status  New       Plan - 12/21/18 1348    Clinical Impression Statement  Pt presents today with mild dysarthria stemming from s/sx MS. Pt has appt with MS specialist Wed 12-27-18. Pt is back at work as of this week, 12-16 hours/day, and is required to think harder about her speech clarity as she progresses through her workday. She reports stress and frustration decr her speech clarity. Pt would benefit from skilled ST focusing on habitualization of her compensatory measures for speech clarity to lessen mental burden of thinking about using copensatory measures. Pt may require cognitive communication evaluation and/or  objective swallowing evaluaiton at a later date. Lastly, pt reports R UE weakness, as well as 5-6 near falls in the last two weeks. OT and PT evals may want to be considered.   Speech Therapy Frequency  2x / week    Duration  --   6 weeks or 13 total sessions   Treatment/Interventions  Aspiration precaution training;Pharyngeal strengthening exercises;Diet toleration management by SLP;Internal/external aids;Patient/family education;Compensatory strategies;Cueing hierarchy;SLP instruction and feedback;Multimodal communcation approach;Trials of upgraded texture/liquids;Functional tasks;Oral motor exercises    Potential to Achieve Goals  Good    Consulted and Agree with Plan of Care  Patient       Patient will benefit from skilled therapeutic intervention in order to improve the following deficits and impairments:   1. Dysarthria and anarthria   2. Cognitive communication deficit   3. Dysphagia, unspecified type       Problem List Patient Active Problem List   Diagnosis Date Noted  . Slurred speech 12/13/2018  . Anxiety 12/13/2018  . Breast hypertrophy in female 02/27/2015  . Hyponatremia 03/04/2012  . Acute cholecystitis 03/02/2012  . Morbid obesity (HCC) 08/25/2011  . Von Willebrand disease - hx PPH 04/14/2011  . History of  abnormal Pap smear - had colpo 04/14/2011  . Infertility, female 04/14/2011  . HSV-2 infection - history, no outbreaks 04/14/2011  . Anemia 04/14/2011  . Asthma - uses inhaler PRN 04/14/2011  . History of pyelonephritis - frequent history as a child 04/14/2011  . Migraine 04/14/2011  . History of depression 04/14/2011  . History of smoking 04/14/2011    [facility] CenterCHINKE,Ronit Cranfield 12/21/2018, 1:54 PM  [facility] [address] [place], [place], [address] Phone: [phone]   Fax:  [phone]  Name: Ann [name] MRN: [id number] Date of Birth: 05/21/1980

## 2018-12-21 NOTE — Patient Instructions (Signed)
  Continue to think about over-articulating/enunciating when you speak - excellent job today. We are going to work on making it less-cumbersome for you to think about overartiulation and make that more habitual for you.  Continue to heed the recommendations of the speech therapist in the hospital - smaller bites and sips, and be more careful and cognizant when eating. We may refer you for a swallow assessment at a later date, if needed.

## 2018-12-26 ENCOUNTER — Telehealth: Payer: Self-pay | Admitting: Hematology

## 2018-12-26 NOTE — Telephone Encounter (Signed)
I received a new hem referral from Simona Huh, NP for dx of VonWillebrand's disease. [name] has been cld and scheduled to see Dr. Irene Limbo on 9/8 at 10am. She's aware to arrive 20 minutes early.

## 2018-12-27 ENCOUNTER — Encounter: Payer: Self-pay | Admitting: Neurology

## 2018-12-27 ENCOUNTER — Other Ambulatory Visit: Payer: Self-pay

## 2018-12-27 ENCOUNTER — Ambulatory Visit (INDEPENDENT_AMBULATORY_CARE_PROVIDER_SITE_OTHER): Payer: No Typology Code available for payment source | Admitting: Neurology

## 2018-12-27 ENCOUNTER — Ambulatory Visit: Payer: No Typology Code available for payment source

## 2018-12-27 VITALS — BP 145/78 | HR 81 | Temp 97.7°F | Ht 63.0 in | Wt 242.0 lb

## 2018-12-27 DIAGNOSIS — R4781 Slurred speech: Secondary | ICD-10-CM | POA: Diagnosis not present

## 2018-12-27 DIAGNOSIS — R1310 Dysphagia, unspecified: Secondary | ICD-10-CM

## 2018-12-27 DIAGNOSIS — R208 Other disturbances of skin sensation: Secondary | ICD-10-CM | POA: Diagnosis not present

## 2018-12-27 DIAGNOSIS — G35 Multiple sclerosis: Secondary | ICD-10-CM

## 2018-12-27 DIAGNOSIS — R471 Dysarthria and anarthria: Secondary | ICD-10-CM

## 2018-12-27 DIAGNOSIS — Z79899 Other long term (current) drug therapy: Secondary | ICD-10-CM

## 2018-12-27 DIAGNOSIS — R269 Unspecified abnormalities of gait and mobility: Secondary | ICD-10-CM | POA: Diagnosis not present

## 2018-12-27 DIAGNOSIS — G35A Relapsing-remitting multiple sclerosis: Secondary | ICD-10-CM | POA: Insufficient documentation

## 2018-12-27 DIAGNOSIS — R41841 Cognitive communication deficit: Secondary | ICD-10-CM

## 2018-12-27 MED ORDER — GABAPENTIN 300 MG PO CAPS: ORAL_CAPSULE | ORAL | 11 refills | Status: DC

## 2018-12-27 NOTE — Progress Notes (Addendum)
[facility]  PATIENT: Ann [name] DOB: 09/18/1980  REFERRING DOCTOR OR PCP: Courtney Parisachel McCoy (PCP); Ritta SlotMcneill Kirkpatrick (neuro hospitalist) SOURCE: Patient, notes from recent hospital admission, lab reports, imaging reports, MRI images personally reviewed  _________________________________   HISTORICAL  CHIEF COMPLAINT:  Chief Complaint  Patient presents with  . New Patient (Initial Visit)    RM [id number] with mother (temp: 98.0) Internal referral for MS from Rejeana BrockKirkpatrick, McNeill P, MD. Has MRI brain/cervical 12/13/2018 (can be viewed in epic). was dx 12/13/2018. Not on DMT yet. Received IV steroidsx5 days in the hospital.    HISTORY OF PRESENT ILLNESS:  I had the pleasure of seeing patient, Ann [name] at the MS center at [facility] for neurologic consultation regarding her recent diagnosis of MS.  She is a 38 year old woman who had the onset of slurred speech 12/10/2018.   When symptoms persisted, she went to the ED a few days later.   Her Ct scan was normal.   The MRi was consistent with MS.    One focus enhanced helping to confirm the diagnosis.   MRI cervical spine did not show any MS plaque according to the official interpretation.  However, by my interpretation there are 2 foci within the spinal cord, one medially slightly to the left adjacent to C3 and another focus adjacent to T3-T4 noted on the sagittal images but not covered on the axial views..    She had 5 days of IV Solumedrol and her speech was much better by the time she was discharged.       She actually had noted some clumsiness with her gait in early July.  She still feels a little clumsy though she is better than she was last month.  Of note, she has had dysesthesias in the chest since this event.  Also, she had right visual blurriness that started 5-6 years ago.   She doesn't recall an actual diagnosis but she did have intra-ocular steroid injections.  She has felt much  more tired the last 2 months.    She is sleeping poorly.   She snores. She has never had witnessed OSA.    She had a sleep study that was 'inconclusive' last year.    She has 3 cousins with MS.   One was diagnosed in her 2220's and the other two in their 4250's.    Currently, gait is mildly off balanced.   She has had stumbles but no falls.   She denies weakness but the legs feel heavy and more sluggish.   She denies numbness or tingling in the limbs but does note some in the left scalp near the vertex.  Additionally, she has painful dysesthesias in the trunk.  She is noting some issues with urinary frequency.  She had a single episode of urinary incontinence last month, shortly after the balance issues started.   She notes that vision is more blurry on the right and colors are desaturated OD.     She is mostly healthy but had menorrhagia and was diagnosed with Von Willebrand disease.    I personally reviewed the MRI of the brain performed 12/13/2018 and the MRI of the cervical spine performed 12/13/2018.    The MRI of the brain shows multiple T2/flair hyperintense foci in the periventricular, juxtacortical deep white matter consistent with demyelinating plaque.  One focus in the left deep/periventricular white matter enhanced after contrast consistent with an acute demyelinating plaque.  This plaque was also hyperintense on diffusion-weighted  images.  The brainstem was fine.  MRI of the cervical spine shows 2 plaques, one small focus ventromedially towards the left adjacent to C3 and another focus adjacent to T3-T4 (not covered on the axial views)  REVIEW OF SYSTEMS: Constitutional: No fevers, chills, sweats, or change in appetite.  She has fatigue. Eyes: No visual changes, double vision, eye pain Ear, nose and throat: No hearing loss, ear pain, nasal congestion, sore throat Cardiovascular: No chest pain, palpitations Respiratory: No shortness of breath at rest or with exertion.   No wheezes  GastrointestinaI: No nausea, vomiting, diarrhea, abdominal pain, fecal incontinence Genitourinary: No dysuria, urinary retention or frequency.  No nocturia. Musculoskeletal: No neck pain, back pain Integumentary: No rash, pruritus, skin lesions Neurological: as above Psychiatric: No depression at this time.  No anxiety Endocrine: No palpitations, diaphoresis, change in appetite, change in weigh or increased thirst Hematologic/Lymphatic: No anemia, purpura, petechiae.  She has von Willebrand's disease not requiring any treatment Allergic/Immunologic: No itchy/runny eyes, nasal congestion, recent allergic reactions, rashes  ALLERGIES: Allergies  Allergen Reactions  . Compazine [Prochlorperazine Edisylate]     Jaws locked up per pt  . Darvocet [Propoxyphene N-Acetaminophen] Nausea And Vomiting  . Codeine Nausea And Vomiting and Rash  . Sulfa Antibiotics Rash    HOME MEDICATIONS:  Current Outpatient Medications:  .  ALPRAZolam (XANAX) 1 MG tablet, Take 0.5 mg by mouth 2 (two) times daily as needed for anxiety. , Disp: , Rfl:  .  BIOTIN PO, Take 1 Dose by mouth daily., Disp: , Rfl:  .  Cyanocobalamin (B-12 PO), Take 1 Dose by mouth daily., Disp: , Rfl:  .  meclizine (ANTIVERT) 25 MG tablet, Take 25 mg by mouth 3 (three) times daily as needed for dizziness., Disp: , Rfl:  .  traMADol (ULTRAM) 50 MG tablet, Take 2 tablets (100 mg total) by mouth daily as needed for severe pain (headache)., Disp: 5 tablet, Rfl: 0 .  Vitamin D, Ergocalciferol, (DRISDOL) 1.25 MG (50000 UT) CAPS capsule, Take 50,000 Units by mouth every 7 (seven) days., Disp: , Rfl:  .  etodolac (LODINE XL) 400 MG 24 hr tablet, Take 400 mg by mouth 2 (two) times daily as needed for pain., Disp: , Rfl:  .  gabapentin (NEURONTIN) 300 MG capsule, One po qAm, one po qPM and two po QHS, Disp: 120 capsule, Rfl: 11  PAST MEDICAL HISTORY: Past Medical History:  Diagnosis Date  . ADD (attention deficit disorder)    takes Adderall  daily  . Anemia   . Anxiety    takes Xanax daily as needed  . Asthma    mild case per pt  . Chronic lower back pain   . Depression    has meds prescribed but doesn't take them  . Family history of adverse reaction to anesthesia    "Mom gets PONV too"  . Headache    "monthly" (02/27/2015)  . Hemorrhoids   . History of esophagogastroduodenoscopy (EGD)   . Migraine    "none in the last 6 months" (02/27/2015)  . Nocturia   . Pneumonia "several times"  . PONV (postoperative nausea and vomiting)   . Von Willebrand disease (HCC)    Dr. Cyndie Chime    PAST SURGICAL HISTORY: Past Surgical History:  Procedure Laterality Date  . BACK SURGERY    . BREAST CYST EXCISION Left 02/20/2018   Procedure: EXCISION OF LEFT BREAST SEBACEOUS CYST;  Surgeon: Emelia Loron, MD;  Location: [facility];  Service: General;  Laterality: Left;  . BREAST REDUCTION SURGERY Bilateral 02/27/2015   Procedure: BILATERAL BREAST REDUCTION WITH FREE NIPPLE GRAFT TECHNIQUE ON RIGHT BREAST;  Surgeon: Crissie Reese, MD;  Location: [place];  Service: Plastics;  Laterality: Bilateral;  . CESAREAN SECTION  2004; 2013  . CHOLECYSTECTOMY  03/02/2012   Procedure: LAPAROSCOPIC CHOLECYSTECTOMY WITH INTRAOPERATIVE CHOLANGIOGRAM;  Surgeon: Haywood Lasso, MD;  Location: [place];  Service: General;  Laterality: N/A;  . DILATION AND CURETTAGE OF UTERUS  ~ 1997; ~ 2005  . DILITATION & CURRETTAGE/HYSTROSCOPY WITH THERMACHOICE ABLATION  04/28/2012   Procedure: DILATATION & CURETTAGE/HYSTEROSCOPY WITH THERMACHOICE ABLATION;  Surgeon: Cyril Mourning, MD;  Location: [facility];  Service: Gynecology;  Laterality: N/A;  . ERCP  03/03/2012   Procedure: ENDOSCOPIC RETROGRADE CHOLANGIOPANCREATOGRAPHY (ERCP);  Surgeon: Beryle Beams, MD;  [facility];  Service: Endoscopy;  Laterality: N/A;  dl/hung  . LAPAROSCOPY  2011  . [place] SURGERY  2003; 2008; 2009  . MULTIPLE TOOTH EXTRACTIONS  "scattered dates"  .  POSTERIOR LUMBAR FUSION  2010  . REDUCTION MAMMAPLASTY Bilateral 02/27/2015  . TUBAL LIGATION  2013  . WISDOM TOOTH EXTRACTION  2001    FAMILY HISTORY: Family History  Problem Relation Age of Onset  . Stroke Mother   . Arthritis Mother   . Depression Mother   . COPD Father   . Kidney disease Paternal Uncle   . Stroke Maternal Grandfather   . Heart disease Paternal Grandfather   . Multiple sclerosis Neg Hx     SOCIAL HISTORY:  Social History   Socioeconomic History  . Marital status: Married    Spouse name: Not on file  . Number of children: Not on file  . Years of education: Not on file  . Highest education level: Not on file  Occupational History  . Occupation: regulatory affairs  Social Needs  . Financial resource strain: Not on file  . Food insecurity    Worry: Not on file    Inability: Not on file  . Transportation needs    Medical: Not on file    Non-medical: Not on file  Tobacco Use  . Smoking status: Former Smoker    Packs/day: 0.25    Years: 2.00    Pack years: 0.50    Types: Cigarettes    Quit date: 2000    Years since quitting: 20.6  . Smokeless tobacco: Never Used  Substance and Sexual Activity  . Alcohol use: Yes    Alcohol/week: 0.0 standard drinks    Comment: social  . Drug use: Yes    Types: Marijuana    Comment: occasional use, last use last week  . Sexual activity: Yes    Birth control/protection: None    Comment: BTL  Lifestyle  . Physical activity    Days per week: Not on file    Minutes per session: Not on file  . Stress: Not on file  Relationships  . Social Herbalist on phone: Not on file    Gets together: Not on file    Attends religious service: Not on file    Active member of club or organization: Not on file    Attends meetings of clubs or organizations: Not on file    Relationship status: Not on file  . Intimate partner violence    Fear of current or ex partner: Not on file    Emotionally abused: Not on file     Physically abused: Not on file  Forced sexual activity: Not on file  Other Topics Concern  . Not on file  Social History Narrative   Lives with husband and kids   Caffeine use:    16 oz per day   Right handed     PHYSICAL EXAM  Vitals:   12/27/18 1327  BP: (!) 145/78  Pulse: 81  Temp: 97.7 F (36.5 C)  SpO2: 98%  Weight: 242 lb (109.8 kg)  Height: 5\' 3"  (1.6 m)    Body mass index is 42.87 kg/m.   General: The patient is well-developed and well-nourished and in no acute distress  HEENT:  Head is [place]/AT.  Sclera are anicteric.  Funduscopic exam shows normal optic discs and retinal vessels.  Neck: No carotid bruits are noted.  The neck is nontender.  Cardiovascular: The heart has a regular rate and rhythm with a normal S1 and S2. There were no murmurs, gallops or rubs.    Skin: Extremities are without rash or  edema.  Musculoskeletal:  Back is nontender  Neurologic Exam  Mental status: The patient is alert and oriented x 3 at the time of the examination. The patient has apparent normal recent and remote memory, with an apparently normal attention span and concentration ability.   Speech is normal.  Cranial nerves: Extraocular movements are full. Pupils are equal, round, and reactive to light and accomodation.  Visual fields are full.  Facial symmetry is present. There is good facial sensation to soft touch bilaterally.Facial strength is normal.  Trapezius and sternocleidomastoid strength is normal. No dysarthria is noted.  The tongue is midline, and the patient has symmetric elevation of the soft palate. No obvious hearing deficits are noted.  Motor:  Muscle bulk is normal.   Tone is normal. Strength is  5 / 5 in all 4 extremities.   Sensory: Sensory testing is intact to pinprick, soft touch and vibration sensation in all 4 extremities.  Coordination: Cerebellar testing reveals good finger-nose-finger and heel-to-shin bilaterally.  Gait and station: Station is  normal.   Gait is normal. Tandem gait is mildly wide. Romberg is negative.   Reflexes: Deep tendon reflexes are symmetric and normal bilaterally.   Plantar responses are flexor.    DIAGNOSTIC DATA (LABS, IMAGING, TESTING) - I reviewed patient records, labs, notes, testing and imaging myself where available.  Lab Results  Component Value Date   WBC 9.1 12/12/2018   HGB 14.3 12/12/2018   HCT 42.0 12/12/2018   MCV 97.2 12/12/2018   PLT 341 12/12/2018      Component Value Date/Time   NA 141 12/12/2018 1809   NA 140 02/11/2015 1033   K 3.7 12/12/2018 1809   CL 106 12/12/2018 1809   CO2 23 12/12/2018 1739   GLUCOSE 90 12/12/2018 1809   BUN 7 12/12/2018 1809   BUN 7 02/11/2015 1033   CREATININE 0.80 12/12/2018 1809   CREATININE 0.59 09/07/2011 1801   CALCIUM 9.5 12/12/2018 1739   PROT 7.6 12/12/2018 1739   PROT 7.4 02/11/2015 1033   ALBUMIN 4.3 12/12/2018 1739   ALBUMIN 4.7 02/11/2015 1033   AST 24 12/12/2018 1739   ALT 24 12/12/2018 1739   ALKPHOS 54 12/12/2018 1739   BILITOT 0.6 12/12/2018 1739   BILITOT 0.6 02/11/2015 1033   GFRNONAA >60 12/12/2018 1739   GFRAA >60 12/12/2018 1739   Lab Results  Component Value Date   CHOL 185 12/14/2018   HDL 52 12/14/2018   LDLCALC 122 (H) 12/14/2018   TRIG 56 12/14/2018  CHOLHDL 3.6 12/14/2018   Lab Results  Component Value Date   HGBA1C 5.1 12/14/2018   No results found for: [id number] Lab Results  Component Value Date   TSH 0.979 12/13/2018       ASSESSMENT AND PLAN  1. Multiple sclerosis (HCC)   2. Dysesthesia   3. Gait disturbance   4. Slurred speech     In summary, Ann [name] is a 38 year old woman recently diagnosed with MS after presenting with slurred speech, that resolved with IV steroids.  Of note, she also had symptoms about a month earlier with gait clumsiness and truncal dysesthesias.  Those symptoms could be consistent with the T3-T4 focus noted on cervical MRI.  The combination of acute  demyelination with chronic demyelinating plaque is consistent with MS.  We had a conversation about possible treatments.  Because she has 2 spinal cord plaques, she is at risk of a more aggressive course of MS.  We discussed options including Tysabri and Ocrevus which have the highest efficacy.  She is most interested in [place].  She would likely qualify for an open label Ocrevus in minorities study that we have just initiated.  I will have our research staff reach out to her.  If she does not qualify we will try to get her started on 1 of her medications as soon as possible.  For her truncal dysesthesias I will start gabapentin.  We can titrate the dose up as needed.  She will return to see me based on the drug study timeline or in several months.  She should call us if any new or worsening neurologic symptoms.  Thank you for asking me to see Ann [name].  Please let me know if I can be of further assistance with her or other patients in the future.  Mella Inclan A. Epimenio Foot, MD, PhD, FAAN Certified in Neurology, Clinical Neurophysiology, Sleep Medicine, Pain Medicine and Neuroimaging Director, Multiple Sclerosis Center at [facility]  [facility] [address], [address] [place], [place] [address] [phone]

## 2018-12-27 NOTE — Patient Instructions (Signed)
  Schedule high necessity talking meetings/appointments when you are going to have your best articulation - earlier in the AMs.  Don't schedule other non-work meetings necessary for talking on Tuesdays or Thursdays, when you have work meetings with a high necessity for talking.

## 2018-12-27 NOTE — Therapy (Signed)
[facility] [address] [place], [place], [address] Phone: [phone]   Fax:  [phone]  Speech Language Pathology Treatment  Patient Details  Name: Ann [name] MRN: [id number] Date of Birth: [birth year] Referring Provider (SLP): Cordelia Poche, MD   Encounter Date: 12/27/2018  End of Session - 12/27/18 1348    Visit Number  2    Number of Visits  13    Date for SLP Re-Evaluation  02/15/19    SLP Start Time  0936    SLP Stop Time   1016    SLP Time Calculation (min)  40 min    Activity Tolerance  Patient tolerated treatment well       Past Medical History:  Diagnosis Date  . ADD (attention deficit disorder)    takes Adderall daily  . Anemia   . Anxiety    takes Xanax daily as needed  . Asthma    mild case per pt  . Chronic lower back pain   . Depression    has meds prescribed but doesn't take them  . Family history of adverse reaction to anesthesia    "Mom gets PONV too"  . Headache    "monthly" (02/27/2015)  . Hemorrhoids   . History of esophagogastroduodenoscopy (EGD)   . Migraine    "none in the last 6 months" (02/27/2015)  . Nocturia   . Pneumonia "several times"  . PONV (postoperative nausea and vomiting)   . Von Willebrand disease ([place])    Dr. Beryle Beams    Past Surgical History:  Procedure Laterality Date  . BACK SURGERY    . BREAST CYST EXCISION Left 02/20/2018   Procedure: EXCISION OF LEFT BREAST SEBACEOUS CYST;  Surgeon: Rolm Bookbinder, MD;  Location: [place];  Service: General;  Laterality: Left;  . BREAST REDUCTION SURGERY Bilateral 02/27/2015   Procedure: BILATERAL BREAST REDUCTION WITH FREE NIPPLE GRAFT TECHNIQUE ON RIGHT BREAST;  Surgeon: Crissie Reese, MD;  Location: [place];  Service: Plastics;  Laterality: Bilateral;  . CESAREAN SECTION  2004; 2013  . CHOLECYSTECTOMY  03/02/2012   Procedure: LAPAROSCOPIC CHOLECYSTECTOMY WITH INTRAOPERATIVE  CHOLANGIOGRAM;  Surgeon: Haywood Lasso, MD;  Location: [place];  Service: General;  Laterality: N/A;  . DILATION AND CURETTAGE OF UTERUS  ~ 1997; ~ 2005  . DILITATION & CURRETTAGE/HYSTROSCOPY WITH THERMACHOICE ABLATION  04/28/2012   Procedure: DILATATION & CURETTAGE/HYSTEROSCOPY WITH THERMACHOICE ABLATION;  Surgeon: Cyril Mourning, MD;  Location: [facility];  Service: Gynecology;  Laterality: N/A;  . ERCP  03/03/2012   Procedure: ENDOSCOPIC RETROGRADE CHOLANGIOPANCREATOGRAPHY (ERCP);  Surgeon: Beryle Beams, MD;  [facility]: [facility];  Service: Endoscopy;  Laterality: N/A;  dl/hung  . LAPAROSCOPY  2011  . [place] SURGERY  2003; 2008; 2009  . MULTIPLE TOOTH EXTRACTIONS  "scattered dates"  . POSTERIOR LUMBAR FUSION  2010  . REDUCTION MAMMAPLASTY Bilateral 02/27/2015  . TUBAL LIGATION  2013  . WISDOM TOOTH EXTRACTION  2001    There were no vitals filed for this visit.  Subjective Assessment - 12/27/18 0948    Subjective  Pt had high stress level yesterday adn so speech was worse it's been since the hospital.    Currently in Pain?  No/denies            ADULT SLP TREATMENT - 12/27/18 0951      General Information   Behavior/Cognition  Alert;Cooperative;Pleasant mood      Cognitive-Linquistic Treatment   Treatment focused on  Dysarthria    Skilled Treatment  "I try to concentrate on what I'm saying.Marland Kitchen.Marland Kitchen.I say 'enunciate' because I know my speech is bad." Pt reports she thinks this "all the time" in order to produce speech with WNL clarity. In 20 minutes mod complex conversation today, pt articulation was WNL. SLP talked with pt about compensatory measures - including situation/environmental/schedule management to maximize articuatory precision, and pt's management of her fatigue level. Pt feels like her attention is worse than it was 2 months ago - she has dxed ADD however attention is more difficult now. SLP to assess pt with cognitive linguistic assessment next visit and add  goals accordingly.        Assessment / Recommendations / Plan   Plan  Continue with current plan of care      Progression Toward Goals   Progression toward goals  Progressing toward goals       SLP Education - 12/27/18 1348    Education Details  Energy conservation for talking/speech clarity, ST plan of care    Person(s) Educated  Patient    Methods  Explanation    Comprehension  Verbalized understanding       SLP Short Term Goals - 12/27/18 1351      SLP SHORT TERM GOAL #1   Title  pt will tell SLP 2 overt s/sx aspiration and 3 s/sx aspiration PNA with modified independence    Time  3    Period  Weeks    Status  On-going      SLP SHORT TERM GOAL #2   Title  pt will demo 10 minutes of mod complex conversation using compensations for speech intelligibility, overt three sessions    Time  3    Period  Weeks    Status  On-going      SLP SHORT TERM GOAL #3   Title  assess pt's cognitive linguistics if deemed clinically appropriate in the first 3 sessions    Time  2    Period  Weeks    Status  On-going       SLP Long Term Goals - 12/27/18 1352      SLP LONG TERM GOAL #1   Title  pt will engage in 20 minutes mod complex/complex conversation (work-like conversation) with successful use of compensations over three sessions    Time  6    Period  Weeks    Status  On-going      SLP LONG TERM GOAL #2   Title  pt will complete dysarthria HEP (tongue twisters, etc.) independently in two sessions    Time  6    Period  Weeks    Status  On-going       Plan - 12/27/18 1349    Clinical Impression Statement  Pt presents today with speech sounding WNL Pt has appt with MS specialist later today. See "skilled intervention" for more details of today's session. Pt cont to think harder about her speech clarity. SLP to administer Cognitive Linguistic Quick Test next session as pt states she thinks her attention is worse in the last 4-6 weeks. Pt would benefit from skilled ST focusing on  habitualization of her compensatory measures for speech clarity to lessen mental burden of thinking about using copensatory measures. Pt may require objective swallowing evaluaiton at a later date.    Speech Therapy Frequency  2x / week    Duration  --   6 weeks or 13 total sessions   Treatment/Interventions  Aspiration precaution  training;Pharyngeal strengthening exercises;Diet toleration management by SLP;Internal/external aids;Patient/family education;Compensatory strategies;Cueing hierarchy;SLP instruction and feedback;Multimodal communcation approach;Trials of upgraded texture/liquids;Functional tasks;Oral motor exercises    Potential to Achieve Goals  Good    Consulted and Agree with Plan of Care  Patient       Patient will benefit from skilled therapeutic intervention in order to improve the following deficits and impairments:   1. Dysarthria and anarthria   2. Cognitive communication deficit   3. Dysphagia, unspecified type       Problem List Patient Active Problem List   Diagnosis Date Noted  . Slurred speech 12/13/2018  . Anxiety 12/13/2018  . Breast hypertrophy in female 02/27/2015  . Hyponatremia 03/04/2012  . Acute cholecystitis 03/02/2012  . Morbid obesity (HCC) 08/25/2011  . Von Willebrand disease - hx PPH 04/14/2011  . History of abnormal Pap smear - had colpo 04/14/2011  . Infertility, female 04/14/2011  . HSV-2 infection - history, no outbreaks 04/14/2011  . Anemia 04/14/2011  . Asthma - uses inhaler PRN 04/14/2011  . History of pyelonephritis - frequent history as a child 04/14/2011  . Migraine 04/14/2011  . History of depression 04/14/2011  . History of smoking 04/14/2011    [facility] LPCHINKE,Dannya Pitkin ,MS, CCC-SLP  12/27/2018, 1:52 PM  [facility] [address] [place], [place], [address] Phone: [phone]   Fax:  [phone]   Name: Sevana Marsh Dolly Lindsey-Evans MRN: [id number] Date of Birth: 12/17/1980

## 2019-01-01 ENCOUNTER — Other Ambulatory Visit: Payer: Self-pay | Admitting: Neurology

## 2019-01-01 ENCOUNTER — Other Ambulatory Visit: Payer: Self-pay | Admitting: *Deleted

## 2019-01-01 ENCOUNTER — Ambulatory Visit: Payer: Self-pay

## 2019-01-01 DIAGNOSIS — G35 Multiple sclerosis: Secondary | ICD-10-CM

## 2019-01-01 DIAGNOSIS — Z79899 Other long term (current) drug therapy: Secondary | ICD-10-CM

## 2019-01-01 NOTE — Addendum Note (Signed)
Addended by: Inis Sizer D on: 01/01/2019 04:32 PM   Modules accepted: Orders

## 2019-01-04 ENCOUNTER — Ambulatory Visit: Payer: No Typology Code available for payment source

## 2019-01-04 LAB — COMPREHENSIVE METABOLIC PANEL
ALT: 19 IU/L (ref 0–32)
AST: 17 IU/L (ref 0–40)
Albumin/Globulin Ratio: 1.5 (ref 1.2–2.2)
Albumin: 4.5 g/dL (ref 3.8–4.8)
Alkaline Phosphatase: 75 IU/L (ref 39–117)
BUN/Creatinine Ratio: 8 — ABNORMAL LOW (ref 9–23)
BUN: 7 mg/dL (ref 6–24)
Bilirubin Total: <0.2 mg/dL (ref 0.0–1.2)
CO2: 18 mmol/L — ABNORMAL LOW (ref 20–29)
Calcium: 10.1 mg/dL (ref 8.7–10.2)
Chloride: 109 mmol/L — ABNORMAL HIGH (ref 96–106)
Creatinine, Ser: 0.84 mg/dL (ref 0.57–1.00)
GFR calc Af Amer: 101 mL/min/{1.73_m2} (ref >59)
GFR calc non Af Amer: 87 mL/min/{1.73_m2} (ref >59)
Globulin, Total: 3.1 g/dL (ref 1.5–4.5)
Glucose: 88 mg/dL (ref 65–99)
Potassium: 4.5 mmol/L (ref 3.5–5.2)
Sodium: 150 mmol/L — ABNORMAL HIGH (ref 134–144)
Total Protein: 7.6 g/dL (ref 6.0–8.5)

## 2019-01-04 LAB — URINALYSIS, ROUTINE W REFLEX MICROSCOPIC
Bilirubin, UA: NEGATIVE
Glucose, UA: NEGATIVE
Ketones, UA: NEGATIVE
Leukocytes,UA: NEGATIVE
Nitrite, UA: NEGATIVE
Protein,UA: NEGATIVE
RBC, UA: NEGATIVE
Specific Gravity, UA: 1.014 (ref 1.005–1.030)
Urobilinogen, Ur: 0.2 mg/dL (ref 0.2–1.0)
pH, UA: 8.5 — ABNORMAL HIGH (ref 5.0–7.5)

## 2019-01-04 LAB — CBC WITH DIFFERENTIAL/PLATELET
Basophils Absolute: 0 10*3/uL (ref 0.0–0.2)
Basos: 0 %
EOS (ABSOLUTE): 0.1 10*3/uL (ref 0.0–0.4)
Eos: 1 %
Hematocrit: 37 % (ref 34.0–46.6)
Hemoglobin: 12.2 g/dL (ref 11.1–15.9)
Immature Grans (Abs): 0 10*3/uL (ref 0.0–0.1)
Immature Granulocytes: 0 %
Lymphocytes Absolute: 3.3 10*3/uL — ABNORMAL HIGH (ref 0.7–3.1)
Lymphs: 37 %
MCH: 31.1 pg (ref 26.6–33.0)
MCHC: 33 g/dL (ref 31.5–35.7)
MCV: 94 fL (ref 79–97)
Monocytes Absolute: 0.5 10*3/uL (ref 0.1–0.9)
Monocytes: 5 %
Neutrophils Absolute: 5.1 10*3/uL (ref 1.4–7.0)
Neutrophils: 57 %
Platelets: 250 10*3/uL (ref 150–450)
RBC: 3.92 x10E6/uL (ref 3.77–5.28)
RDW: 12.5 % (ref 11.7–15.4)
WBC: 9 10*3/uL (ref 3.4–10.8)

## 2019-01-04 LAB — IGG, IGA, IGM
IgA/Immunoglobulin A, Serum: 280 mg/dL (ref 87–352)
IgG (Immunoglobin G), Serum: 1138 mg/dL (ref 586–1602)
IgM (Immunoglobulin M), Srm: 152 mg/dL (ref 26–217)

## 2019-01-04 LAB — HEPATITIS B CORE ANTIBODY, TOTAL: Hep B Core Total Ab: NEGATIVE

## 2019-01-04 LAB — HEPATITIS B SURFACE ANTIBODY,QUALITATIVE: Hep B Surface Ab, Qual: REACTIVE

## 2019-01-04 LAB — HEPATITIS B DNA, ULTRAQUANTITATIVE, PCR: HBV DNA SERPL PCR-ACNC: NOT DETECTED IU/mL

## 2019-01-04 LAB — HCG, SERUM, QUALITATIVE: hCG,Beta Subunit,Qual,Serum: NEGATIVE m[IU]/mL (ref <6)

## 2019-01-04 LAB — HEPATITIS B SURFACE ANTIGEN: Hepatitis B Surface Ag: NEGATIVE

## 2019-01-08 ENCOUNTER — Telehealth: Payer: Self-pay | Admitting: *Deleted

## 2019-01-08 NOTE — Telephone Encounter (Signed)
Pt called back. She did not need coverage for single period of time. She used PTO while in the hospital. She only needed time for appt/flare ups. Advised Dr. Felecia Shelling provided her 5-6days/yr (1day per appt) and for flare-ups (3 times per year (1-5days per episode). Advised we can addend paperwork if needed moving forward. She verbalized understanding.  Gave completed/signed forms to [employer]. In medical records to process for pt.

## 2019-01-08 NOTE — Telephone Encounter (Signed)
Pt  [employer] form faxed on 01/08/19 to [phone]

## 2019-01-08 NOTE — Telephone Encounter (Signed)
Called, LVM for pt to call about FMLA forms. Needing clarification on leave dates

## 2019-01-09 ENCOUNTER — Ambulatory Visit: Payer: No Typology Code available for payment source

## 2019-01-09 ENCOUNTER — Other Ambulatory Visit: Payer: Self-pay | Admitting: Neurology

## 2019-01-11 ENCOUNTER — Ambulatory Visit: Payer: No Typology Code available for payment source | Attending: Family Medicine

## 2019-01-11 ENCOUNTER — Other Ambulatory Visit: Payer: Self-pay

## 2019-01-11 DIAGNOSIS — R471 Dysarthria and anarthria: Secondary | ICD-10-CM | POA: Insufficient documentation

## 2019-01-11 DIAGNOSIS — R41841 Cognitive communication deficit: Secondary | ICD-10-CM | POA: Diagnosis not present

## 2019-01-11 DIAGNOSIS — R1310 Dysphagia, unspecified: Secondary | ICD-10-CM | POA: Insufficient documentation

## 2019-01-12 NOTE — Patient Instructions (Signed)
  Think about re-medicating for your ADD. This might help your decr'd attention.

## 2019-01-12 NOTE — Therapy (Signed)
[facility] [address] [place], [place], [address] Phone: [phone]   Fax:  [phone]  Speech Language Pathology Treatment  Patient Details  Name: Ann [name] MRN: [id number] Date of Birth: 07/07/1980 Referring Provider (SLP): Jacquelin HawkingNettey, Ralph, MD   Encounter Date: 01/11/2019  End of Session - 01/12/19 1624    Visit Number  3    Number of Visits  13    Date for SLP Re-Evaluation  02/15/19    SLP Start Time  1150    SLP Stop Time   1230    SLP Time Calculation (min)  40 min    Activity Tolerance  Patient tolerated treatment well       Past Medical History:  Diagnosis Date  . ADD (attention deficit disorder)    takes Adderall daily  . Anemia   . Anxiety    takes Xanax daily as needed  . Asthma    mild case per pt  . Chronic lower back pain   . Depression    has meds prescribed but doesn't take them  . Family history of adverse reaction to anesthesia    "Mom gets PONV too"  . Headache    "monthly" (02/27/2015)  . Hemorrhoids   . History of esophagogastroduodenoscopy (EGD)   . Migraine    "none in the last 6 months" (02/27/2015)  . Nocturia   . Pneumonia "several times"  . PONV (postoperative nausea and vomiting)   . Von Willebrand disease (HCC)    Dr. Cyndie ChimeGranfortuna    Past Surgical History:  Procedure Laterality Date  . BACK SURGERY    . BREAST CYST EXCISION Left 02/20/2018   Procedure: EXCISION OF LEFT BREAST SEBACEOUS CYST;  Surgeon: Emelia LoronWakefield, Matthew, MD;  Location: [facility];  Service: General;  Laterality: Left;  . BREAST REDUCTION SURGERY Bilateral 02/27/2015   Procedure: BILATERAL BREAST REDUCTION WITH FREE NIPPLE GRAFT TECHNIQUE ON RIGHT BREAST;  Surgeon: Etter Sjogrenavid Bowers, MD;  [facility];  Service: Plastics;  Laterality: Bilateral;  . CESAREAN SECTION  2004; 2013  . CHOLECYSTECTOMY  03/02/2012   Procedure: LAPAROSCOPIC CHOLECYSTECTOMY WITH INTRAOPERATIVE  CHOLANGIOGRAM;  Surgeon: Currie Parishristian J Streck, MD;  Location: [facility];  Service: General;  Laterality: N/A;  . DILATION AND CURETTAGE OF UTERUS  ~ 1997; ~ 2005  . DILITATION & CURRETTAGE/HYSTROSCOPY WITH THERMACHOICE ABLATION  04/28/2012   Procedure: DILATATION & CURETTAGE/HYSTEROSCOPY WITH THERMACHOICE ABLATION;  Surgeon: Jeani HawkingMichelle L Grewal, MD;  Location: [facility];  Service: Gynecology;  Laterality: N/A;  . ERCP  03/03/2012   Procedure: ENDOSCOPIC RETROGRADE CHOLANGIOPANCREATOGRAPHY (ERCP);  Surgeon: Theda BelfastPatrick D Hung, MD;  [facility];  Service: Endoscopy;  Laterality: N/A;  dl/hung  . LAPAROSCOPY  2011  . LUMBAR DISC SURGERY  2003; 2008; 2009  . MULTIPLE TOOTH EXTRACTIONS  "scattered dates"  . POSTERIOR LUMBAR FUSION  2010  . REDUCTION MAMMAPLASTY Bilateral 02/27/2015  . TUBAL LIGATION  2013  . WISDOM TOOTH EXTRACTION  2001    There were no vitals filed for this visit.  Subjective Assessment - 01/11/19 1200    Subjective  "Everytihng else seems to be coming together. My focus is still off."    Currently in Pain?  No/denies            ADULT SLP TREATMENT - 01/12/19 0001      General Information   Behavior/Cognition  Alert;Cooperative;Pleasant mood      Treatment Provided   Treatment provided  Cognitive-Linquistic  Cognitive-Linquistic Treatment   Treatment focused on  Cognition    Skilled Treatment  SLP completed cognitivce linguistic quick test (CLQT) today with pt scoring WNL. Pt cont to endorse worse attention deficits than 6 months ago. SLP spoke wiht pt about compensations of taking notes during meetings; pt stated in the last two months or so she has had to ask a coworker if the speaker req'd employees to follow up with anything after the meeting due to pt "zoning out" when someone begins talking for > 5 minutes. "That didn't used to happen" pt stated. She has been off ADD meds for two years and SLP brought up pt thinking about re-medicating for this. PT to think  about this.       Assessment / Recommendations / Plan   Plan  Continue with current plan of care      Progression Toward Goals   Progression toward goals  Progressing toward goals       SLP Education - 01/12/19 1624    Education Details  re-medicate for attention deficits?    Person(s) Educated  Patient    Methods  Explanation    Comprehension  Verbalized understanding       SLP Short Term Goals - 01/12/19 1627      SLP SHORT TERM GOAL #1   Title  pt will tell SLP 2 overt s/sx aspiration and 3 s/sx aspiration PNA with modified independence    Time  2    Period  Weeks    Status  On-going      SLP SHORT TERM GOAL #2   Title  pt will demo 10 minutes of mod complex conversation using compensations for speech intelligibility, overt three sessions    Baseline  01-11-19    Time  2    Period  Weeks    Status  On-going      SLP SHORT TERM GOAL #3   Title  assess pt's cognitive linguistics if deemed clinically appropriate in the first 3 sessions    Status  Achieved       SLP Long Term Goals - 01/12/19 1627      SLP LONG TERM GOAL #1   Title  pt will engage in 20 minutes mod complex/complex conversation (work-like conversation) with successful use of compensations over three sessions    Time  5    Period  Weeks    Status  On-going      SLP LONG TERM GOAL #2   Title  pt will complete dysarthria HEP (tongue twisters, etc.) independently in two sessions    Time  5    Period  Weeks    Status  On-going       Plan - 01/12/19 1625    Clinical Impression Statement  Pt presents today with speech sounding WNL Pt has appt with MS specialist later today. See "skilled intervention" for more details of today's session. SLP administered Cognitive Linguistic Quick Test today and pt scored WNL however pt cont to endorse decr'd attention skills. Pt with ADD however has not needed meds for approx 2 years. SLP suggested pt think about remedication for attention deficits at this time. Pt would  benefit from skilled ST focusing on compensatory measures for speech clarity and for attention skills. Pt may require objective swallowing evaluaiton at a later date.    Speech Therapy Frequency  2x / week    Duration  --   6 weeks or 13 total sessions   Treatment/Interventions  Aspiration  precaution training;Pharyngeal strengthening exercises;Diet toleration management by SLP;Internal/external aids;Patient/family education;Compensatory strategies;Cueing hierarchy;SLP instruction and feedback;Multimodal communcation approach;Trials of upgraded texture/liquids;Functional tasks;Oral motor exercises    Potential to Achieve Goals  Good    Consulted and Agree with Plan of Care  Patient       Patient will benefit from skilled therapeutic intervention in order to improve the following deficits and impairments:   Cognitive communication deficit  Dysarthria and anarthria  Dysphagia, unspecified type    Problem List Patient Active Problem List   Diagnosis Date Noted  . Multiple sclerosis (HCC) 12/27/2018  . Dysesthesia 12/27/2018  . Gait disturbance 12/27/2018  . Slurred speech 12/13/2018  . Anxiety 12/13/2018  . Breast hypertrophy in female 02/27/2015  . Hyponatremia 03/04/2012  . Acute cholecystitis 03/02/2012  . Morbid obesity (HCC) 08/25/2011  . Von Willebrand disease - hx PPH 04/14/2011  . History of abnormal Pap smear - had colpo 04/14/2011  . Infertility, female 04/14/2011  . HSV-2 infection - history, no outbreaks 04/14/2011  . Anemia 04/14/2011  . Asthma - uses inhaler PRN 04/14/2011  . History of pyelonephritis - frequent history as a child 04/14/2011  . Migraine 04/14/2011  . History of depression 04/14/2011  . History of smoking 04/14/2011    [facility], ,MS, CCC-SLP  01/12/2019, 4:28 PM  [facility] [address] [place], [place], [address] Phone: [phone]   Fax:  [phone]   Name: Ann Marsh Dolly  [name] MRN: [id number] Date of Birth: 08/06/1980

## 2019-01-15 NOTE — Progress Notes (Signed)
HEMATOLOGY/ONCOLOGY CONSULTATION NOTE  Date of Service: 01/15/2019  Patient Care Team: Courtney ParisMcCoy, Rachel, NP as PCP - General (Nurse Practitioner)  CHIEF COMPLAINTS/PURPOSE OF CONSULTATION:  Von Willebrand's disease  HISTORY OF PRESENTING ILLNESS:   Ann [name] is a wonderful 38 y.o. female who has been referred to us by Dr Courtney Parisachel McCoy for evaluation and management of VonWillebrand's disease type 1A. The pt reports that she is doing well overall.   The pt reports that she was recently diagnosed with Multiple Sclerosis. She was experiencing slurred speech and went to the hospital. They then put her on 5 days of steroids and her symptoms subsided. She still experiences speech issues, body aches and fatigue occasionally. Her current Neurologist is Dr. Epimenio FootSater [facility], who plans on putting her on infusion therapy.   Her mother has bleeding issues and bruises frequently and one of her mother's siblings has similar issues. Neither of her parents have been tested for Von Willebrand's disease. She was diagnosed with Von Willebrand's in 2005 due to C-section related excess bleeding. Since she was younger she had excess nose bleeds before her menstruation, which improved after her cycle was over. She began birth control soon after she began mensturating but it did not help the bleeding. She remained on birth control until she was [age]. She has been pregnant 3 times and has two children, one pregnancy ended in a planned abortion. Both of her children were delivered by C-section. Both have also been tested for Von Willebrand's disease. Her daughter tested positive and her son tested negative but only blood tests were used to diagnose. Her son does have sickle cell trait. She did not have any excessive bleeding with her second C-section, gallbladder surgery or back surgery. She has not required Stimate outside of surgery nor Amicar. Pt had a D&C/ablation in 2013 and has had  controlled bleeding since. Her daughter had an extraction without Stimate and did not have any excessive bleeding issues. In 2017 pt received a laceration from a pencil sharpener, bled for 6 hours and needed stiches for her injuries. She has no upcoming surgeries.   Most recent lab results (01/01/2019) of CBC is as follows: all values are WNL except for Lymphs Abs at 3.3K, BUN/Creatinine Ratio at 8, Sodium at 150, Chloride at 109, CO2 at 18. 01/01/2019 Urinalysis shows all values are WNL except for pH at 8.5.  01/01/2019 hCG is negative.  01/01/2019 Hepatitis B Core AB is negative.  01/01/2019 IgG, IgA, IgM shows all results are WNL.   On review of systems, pt reports fatigue, body aches, speech issues and denies abdominal pain and any other symptoms.   On PMHx the pt reports two C-sections, back surgery, gallbladder surgery, D&C ablation On Social Hx the pt reports no smoking, low alcohol use  On Family Hx the pt reports no diagnosed Von Willebrand's in mother or father, daughter diagnosed with Von Willebrand's    MEDICAL HISTORY:  Past Medical History:  Diagnosis Date  . ADD (attention deficit disorder)    takes Adderall daily  . Anemia   . Anxiety    takes Xanax daily as needed  . Asthma    mild case per pt  . Chronic lower back pain   . Depression    has meds prescribed but doesn't take them  . Family history of adverse reaction to anesthesia    "Mom gets PONV too"  . Headache    "monthly" (02/27/2015)  . Hemorrhoids   .  History of esophagogastroduodenoscopy (EGD)   . Migraine    "none in the last 6 months" (02/27/2015)  . Nocturia   . Pneumonia "several times"  . PONV (postoperative nausea and vomiting)   . Von Willebrand disease (HCC)    Dr. Cyndie ChimeGranfortuna    SURGICAL HISTORY: Past Surgical History:  Procedure Laterality Date  . BACK SURGERY    . BREAST CYST EXCISION Left 02/20/2018   Procedure: EXCISION OF LEFT BREAST SEBACEOUS CYST;  Surgeon: Emelia LoronWakefield, Matthew,  MD;  Location: [facility];  Service: General;  Laterality: Left;  . BREAST REDUCTION SURGERY Bilateral 02/27/2015   Procedure: BILATERAL BREAST REDUCTION WITH FREE NIPPLE GRAFT TECHNIQUE ON RIGHT BREAST;  Surgeon: Etter Sjogrenavid Bowers, MD;  [facility];  Service: Plastics;  Laterality: Bilateral;  . CESAREAN SECTION  2004; 2013  . CHOLECYSTECTOMY  03/02/2012   Procedure: LAPAROSCOPIC CHOLECYSTECTOMY WITH INTRAOPERATIVE CHOLANGIOGRAM;  Surgeon: Currie Parishristian J Streck, MD;  Location: [facility];  Service: General;  Laterality: N/A;  . DILATION AND CURETTAGE OF UTERUS  ~ 1997; ~ 2005  . DILITATION & CURRETTAGE/HYSTROSCOPY WITH THERMACHOICE ABLATION  04/28/2012   Procedure: DILATATION & CURETTAGE/HYSTEROSCOPY WITH THERMACHOICE ABLATION;  Surgeon: Jeani HawkingMichelle L Grewal, MD;  Location: [facility];  Service: Gynecology;  Laterality: N/A;  . ERCP  03/03/2012   Procedure: ENDOSCOPIC RETROGRADE CHOLANGIOPANCREATOGRAPHY (ERCP);  Surgeon: Theda BelfastPatrick D Hung, MD;  [facility];  Service: Endoscopy;  Laterality: N/A;  dl/hung  . LAPAROSCOPY  2011  . LUMBAR DISC SURGERY  2003; 2008; 2009  . MULTIPLE TOOTH EXTRACTIONS  "scattered dates"  . POSTERIOR LUMBAR FUSION  2010  . REDUCTION MAMMAPLASTY Bilateral 02/27/2015  . TUBAL LIGATION  2013  . WISDOM TOOTH EXTRACTION  2001    SOCIAL HISTORY: Social History   Socioeconomic History  . Marital status: Married    Spouse name: Not on file  . Number of children: Not on file  . Years of education: Not on file  . Highest education level: Not on file  Occupational History  . Occupation: regulatory affairs  Social Needs  . Financial resource strain: Not on file  . Food insecurity    Worry: Not on file    Inability: Not on file  . Transportation needs    Medical: Not on file    Non-medical: Not on file  Tobacco Use  . Smoking status: Former Smoker    Packs/day: 0.25    Years: 2.00    Pack years: 0.50    Types: Cigarettes    Quit date: 2000    Years  since quitting: 20.6  . Smokeless tobacco: Never Used  Substance and Sexual Activity  . Alcohol use: Yes    Alcohol/week: 0.0 standard drinks    Comment: social  . Drug use: Yes    Types: Marijuana    Comment: occasional use, last use last week  . Sexual activity: Yes    Birth control/protection: None    Comment: BTL  Lifestyle  . Physical activity    Days per week: Not on file    Minutes per session: Not on file  . Stress: Not on file  Relationships  . Social Musicianconnections    Talks on phone: Not on file    Gets together: Not on file    Attends religious service: Not on file    Active member of club or organization: Not on file    Attends meetings of clubs or organizations: Not on file    Relationship status: Not on file  .  Intimate partner violence    Fear of current or ex partner: Not on file    Emotionally abused: Not on file    Physically abused: Not on file    Forced sexual activity: Not on file  Other Topics Concern  . Not on file  Social History Narrative   Lives with husband and kids   Caffeine use:    16 oz per day   Right handed    FAMILY HISTORY: Family History  Problem Relation Age of Onset  . Stroke Mother   . Arthritis Mother   . Depression Mother   . COPD Father   . Kidney disease Paternal Uncle   . Stroke Maternal Grandfather   . Heart disease Paternal Grandfather   . Multiple sclerosis Neg Hx     ALLERGIES:  is allergic to compazine [prochlorperazine edisylate]; darvocet [propoxyphene n-acetaminophen]; codeine; and sulfa antibiotics.  MEDICATIONS:  Current Outpatient Medications  Medication Sig Dispense Refill  . ALPRAZolam (XANAX) 1 MG tablet Take 0.5 mg by mouth 2 (two) times daily as needed for anxiety.     Marland Kitchen BIOTIN PO Take 1 Dose by mouth daily.    . Cyanocobalamin (B-12 PO) Take 1 Dose by mouth daily.    Marland Kitchen etodolac (LODINE XL) 400 MG 24 hr tablet Take 400 mg by mouth 2 (two) times daily as needed for pain.    Marland Kitchen gabapentin (NEURONTIN)  300 MG capsule One po qAm, one po qPM and two po QHS 120 capsule 11  . meclizine (ANTIVERT) 25 MG tablet Take 25 mg by mouth 3 (three) times daily as needed for dizziness.    . traMADol (ULTRAM) 50 MG tablet Take 2 tablets (100 mg total) by mouth daily as needed for severe pain (headache). 5 tablet 0  . Vitamin D, Ergocalciferol, (DRISDOL) 1.25 MG (50000 UT) CAPS capsule Take 50,000 Units by mouth every 7 (seven) days.     No current facility-administered medications for this visit.     REVIEW OF SYSTEMS:    10 Point review of Systems was done is negative except as noted above.  PHYSICAL EXAMINATION: ECOG PERFORMANCE STATUS: 1 - Symptomatic but completely ambulatory  . Vitals:   01/16/19 1025  BP: (!) 148/88  Pulse: (!) 44  Resp: 18  Temp: 98.9 F (37.2 C)  SpO2: 100%   Filed Weights   01/16/19 1025  Weight: 245 lb 9.6 oz (111.4 kg)   .Body mass index is 43.51 kg/m.   GENERAL:alert, in no acute distress and comfortable SKIN: no acute rashes, no significant lesions EYES: conjunctiva are pink and non-injected, sclera anicteric OROPHARYNX: MMM, no exudates, no oropharyngeal erythema or ulceration NECK: supple, no JVD LYMPH:  no palpable lymphadenopathy in the cervical, axillary or inguinal regions LUNGS: clear to auscultation b/l with normal respiratory effort HEART: regular rate & rhythm ABDOMEN:  normoactive bowel sounds , non tender, not distended. Extremity: no pedal edema PSYCH: alert & oriented x 3 with fluent speech NEURO: no focal motor/sensory deficits  LABORATORY DATA:  I have reviewed the data as listed  . CBC Latest Ref Rng & Units 01/16/2019 01/01/2019 12/12/2018  WBC 4.0 - 10.5 K/uL 7.9 9.0 -  Hemoglobin 12.0 - 15.0 g/dL 12.4 12.2 14.3  Hematocrit 36.0 - 46.0 % 38.5 37.0 42.0  Platelets 150 - 400 K/uL 319 250 -    . CMP Latest Ref Rng & Units 01/16/2019 01/01/2019 12/12/2018  Glucose 70 - 99 mg/dL 92 88 90  BUN 6 - 20 mg/dL  8 7 7   Creatinine 0.44 - 1.00  mg/dL 7.25 3.66 4.40  Sodium 135 - 145 mmol/L 140 150(H) 141  Potassium 3.5 - 5.1 mmol/L 4.0 4.5 3.7  Chloride 98 - 111 mmol/L 106 109(H) 106  CO2 22 - 32 mmol/L 26 18(L) -  Calcium 8.9 - 10.3 mg/dL 9.5 34.7 -  Total Protein 6.5 - 8.1 g/dL 7.6 7.6 -  Total Bilirubin 0.3 - 1.2 mg/dL 0.4 <4.2 -  Alkaline Phos 38 - 126 U/L 59 75 -  AST 15 - 41 U/L 15 17 -  ALT 0 - 44 U/L 14 19 -     RADIOGRAPHIC STUDIES: I have personally reviewed the radiological images as listed and agreed with the findings in the report. No results found.  ASSESSMENT & PLAN:    38 yo with h/o VWD type 1 and recently diagnosed MS  1) Vno Willebrand disease likely type 1 PLAN -Discussed patient's most recent labs from 01/01/2019, all values are WNL except for Lymphs Abs at 3.3K, BUN/Creatinine Ratio at 8, Sodium at 150, Chloride at 109, CO2 at 18. -Discussed 01/01/2019 Urinalysis shows all values are WNL except for pH at 8.5.  -Discussed 01/01/2019 hCG is negative.  -Discussed 01/01/2019 Hepatitis B Core AB is negative.  -Discussed 01/01/2019 IgG, IgA, IgM shows all results are WNL.  -Discussed long term use high dose steroids could cause ulcers  -Recommended that pt is put on medication to prevent ulcers -Recommended avoidance of OTC pain medications, NSAIDS, alcohol use due to increased bleeding risks  -Discussed that certain MS medications could decrease platelet counts  -Plan for additional testing to evaluate status and confirm presence of VWD, including genetic testing. -has been a stimate responder in the past and has needed this with previous C -section and gallbladder surgery. -amicar with dental procedures -Will get labs today -Will see back in 3 weeks via phone    FOLLOW UP: Labs today Phone visit with Dr Candise Che in 3 weeks  All of the patients questions were answered with apparent satisfaction. The patient knows to call the clinic with any problems, questions or concerns.  I spent    counseling the patient face to face. The total time spent in the appointment was 45 mins and more than 50% was on counseling and direct patient cares.    Wyvonnia Lora MD MS AAHIVMS [facility] Hematology/Oncology Physician [facility]  (Office):       [phone] (Work cell):  [phone] (Fax):           [phone]  01/15/2019 11:32 PM  I, Carollee Herter, am acting as a scribe for Dr. Wyvonnia Lora.   .I have reviewed the above documentation for accuracy and completeness, and I agree with the above. Johney Maine MD

## 2019-01-16 ENCOUNTER — Other Ambulatory Visit: Payer: Self-pay

## 2019-01-16 ENCOUNTER — Inpatient Hospital Stay: Payer: No Typology Code available for payment source

## 2019-01-16 ENCOUNTER — Telehealth: Payer: Self-pay | Admitting: Hematology

## 2019-01-16 ENCOUNTER — Inpatient Hospital Stay: Payer: No Typology Code available for payment source | Attending: Hematology | Admitting: Hematology

## 2019-01-16 VITALS — BP 148/88 | HR 44 | Temp 98.9°F | Resp 18 | Ht 63.0 in | Wt 245.6 lb

## 2019-01-16 DIAGNOSIS — D6800 Von Willebrand disease, unspecified: Secondary | ICD-10-CM

## 2019-01-16 DIAGNOSIS — G35 Multiple sclerosis: Secondary | ICD-10-CM | POA: Diagnosis not present

## 2019-01-16 DIAGNOSIS — Z87891 Personal history of nicotine dependence: Secondary | ICD-10-CM | POA: Insufficient documentation

## 2019-01-16 DIAGNOSIS — Z79899 Other long term (current) drug therapy: Secondary | ICD-10-CM | POA: Diagnosis not present

## 2019-01-16 LAB — CBC WITH DIFFERENTIAL/PLATELET
Abs Immature Granulocytes: 0.03 10*3/uL (ref 0.00–0.07)
Basophils Absolute: 0 10*3/uL (ref 0.0–0.1)
Basophils Relative: 0 %
Eosinophils Absolute: 0.1 10*3/uL (ref 0.0–0.5)
Eosinophils Relative: 1 %
HCT: 38.5 % (ref 36.0–46.0)
Hemoglobin: 12.4 g/dL (ref 12.0–15.0)
Immature Granulocytes: 0 %
Lymphocytes Relative: 38 %
Lymphs Abs: 3 10*3/uL (ref 0.7–4.0)
MCH: 31.2 pg (ref 26.0–34.0)
MCHC: 32.2 g/dL (ref 30.0–36.0)
MCV: 96.7 fL (ref 80.0–100.0)
Monocytes Absolute: 0.5 10*3/uL (ref 0.1–1.0)
Monocytes Relative: 6 %
Neutro Abs: 4.3 10*3/uL (ref 1.7–7.7)
Neutrophils Relative %: 55 %
Platelets: 319 10*3/uL (ref 150–400)
RBC: 3.98 MIL/uL (ref 3.87–5.11)
RDW: 12.7 % (ref 11.5–15.5)
WBC: 7.9 10*3/uL (ref 4.0–10.5)
nRBC: 0 % (ref 0.0–0.2)

## 2019-01-16 LAB — CMP (CANCER CENTER ONLY)
ALT: 14 U/L (ref 0–44)
AST: 15 U/L (ref 15–41)
Albumin: 4.4 g/dL (ref 3.5–5.0)
Alkaline Phosphatase: 59 U/L (ref 38–126)
Anion gap: 8 (ref 5–15)
BUN: 8 mg/dL (ref 6–20)
CO2: 26 mmol/L (ref 22–32)
Calcium: 9.5 mg/dL (ref 8.9–10.3)
Chloride: 106 mmol/L (ref 98–111)
Creatinine: 0.85 mg/dL (ref 0.44–1.00)
GFR, Est AFR Am: >60 mL/min (ref >60)
GFR, Estimated: >60 mL/min (ref >60)
Glucose, Bld: 92 mg/dL (ref 70–99)
Potassium: 4 mmol/L (ref 3.5–5.1)
Sodium: 140 mmol/L (ref 135–145)
Total Bilirubin: 0.4 mg/dL (ref 0.3–1.2)
Total Protein: 7.6 g/dL (ref 6.5–8.1)

## 2019-01-16 LAB — VITAMIN B12: Vitamin B-12: 331 pg/mL (ref 180–914)

## 2019-01-16 LAB — FERRITIN: Ferritin: 84 ng/mL (ref 11–307)

## 2019-01-16 NOTE — Telephone Encounter (Signed)
Called patient regarding upcoming appointment, patient is notified and will be reminded by My chart.

## 2019-01-16 NOTE — Patient Instructions (Signed)
Thank you for choosing [facility] to provide your oncology and hematology care.   Should you have questions after your visit to the [facility] ([facility]), please contact this office at [phone] between 8:30 AM and 4:30 PM. Voicemails left after 4:00 PM may not be returned until the following business day. Calls received after 4:30 PM will be answered by an off-site Nurse Triage Line.    Prescription Refills:  Please have your pharmacy contact us directly for most prescription requests.  Contact the office directly for refills of narcotics (pain medications). Allow 48-72 hours for refills.  Appointments: Please contact the [facility] scheduling department [phone] for questions regarding [facility] appointment scheduling.  Contact the schedulers with any scheduling changes so that your appointment can be rescheduled in a timely manner.   Central Scheduling for [facility] [phone] - Call to schedule procedures such as PET scans, CT scans, MRI, Ultrasound, etc.  To afford each patient quality time with our providers, please arrive 30 minutes before your scheduled appointment time.  If you arrive late for your appointment, you may be asked to reschedule.  We strive to give you quality time with our providers, and arriving late affects you and other patients whose appointments are after yours. If you are a no show for multiple scheduled visits, you may be dismissed from the clinic at the providers discretion.     Resources: [facility] Social Workers [phone] for additional information on assistance programs --Anne Cunningham/Abigail Elmore  [place] DSS  [phone]: Information regarding food stamps, Medicaid, and utility assistance [place]AT [phone]   [place] [employer]'s shared-ride transportation service for eligible riders who have a disability that prevents them from riding the fixed route bus.   Medicare Rights Center 800-333-4114 Helps people with  Medicare understand their rights and benefits, navigate the Medicare system, and secure the quality healthcare they deserve [employer] 800-227-2345 Assists patients locate various types of support and financial assistance Cancer Care: 1-800-813-HOPE (4673) Provides financial assistance, online support groups, medication/co-pay assistance.      

## 2019-01-17 ENCOUNTER — Ambulatory Visit: Payer: No Typology Code available for payment source

## 2019-01-17 DIAGNOSIS — R1310 Dysphagia, unspecified: Secondary | ICD-10-CM

## 2019-01-17 DIAGNOSIS — R41841 Cognitive communication deficit: Secondary | ICD-10-CM | POA: Diagnosis not present

## 2019-01-17 DIAGNOSIS — R471 Dysarthria and anarthria: Secondary | ICD-10-CM

## 2019-01-17 NOTE — Patient Instructions (Signed)
Signs of Aspiration Pneumonia   . Chest pain/tightness . Fever (can be low grade) . Cough  o With foul-smelling phlegm (sputum) o With sputum containing pus or blood o With greenish sputum . Fatigue  . Shortness of breath  . Wheezing   **IF YOU HAVE THESE SIGNS, CONTACT YOUR DOCTOR OR GO TO THE EMERGENCY DEPARTMENT OR URGENT CARE AS SOON AS POSSIBLE**      

## 2019-01-17 NOTE — Therapy (Signed)
[facility] [address] [place], [place], [address] Phone: [phone]   Fax:  [phone]  Speech Language Pathology Treatment/Discharge summary  Ann Details  Name: Ann [name] MRN: [id number] Date of Birth: 1980/12/28 Referring Provider (SLP): Cordelia Poche, MD   Encounter Date: 01/17/2019  End of Session - 01/17/19 1245    Visit Number  4    Number of Visits  13    Date for SLP Re-Evaluation  02/15/19    SLP Start Time  1150    SLP Stop Time   1221    SLP Time Calculation (min)  31 min    Activity Tolerance  Ann tolerated treatment well       Past Medical History:  Diagnosis Date  . ADD (attention deficit disorder)    takes Adderall daily  . Anemia   . Anxiety    takes Xanax daily as needed  . Asthma    mild case per Ann  . Chronic lower back pain   . Depression    has meds prescribed but doesn't take them  . Family history of adverse reaction to anesthesia    "Mom gets PONV too"  . Headache    "monthly" (02/27/2015)  . Hemorrhoids   . History of esophagogastroduodenoscopy (EGD)   . Migraine    "none in the last 6 months" (02/27/2015)  . Nocturia   . Pneumonia "several times"  . PONV (postoperative nausea and vomiting)   . Von Willebrand disease ([place])    Dr. Beryle Beams    Past Surgical History:  Procedure Laterality Date  . BACK SURGERY    . BREAST CYST EXCISION Left 02/20/2018   Procedure: EXCISION OF LEFT BREAST SEBACEOUS CYST;  Surgeon: Rolm Bookbinder, MD;  Location: [facility];  Service: General;  Laterality: Left;  . BREAST REDUCTION SURGERY Bilateral 02/27/2015   Procedure: BILATERAL BREAST REDUCTION WITH FREE NIPPLE GRAFT TECHNIQUE ON RIGHT BREAST;  Surgeon: Crissie Reese, MD;  Location: [facility];  Service: Plastics;  Laterality: Bilateral;  . CESAREAN SECTION  2004; 2013  . CHOLECYSTECTOMY  03/02/2012   Procedure: LAPAROSCOPIC CHOLECYSTECTOMY WITH  INTRAOPERATIVE CHOLANGIOGRAM;  Surgeon: Haywood Lasso, MD;  Location: [facility];  Service: General;  Laterality: N/A;  . DILATION AND CURETTAGE OF UTERUS  ~ 1997; ~ 2005  . DILITATION & CURRETTAGE/HYSTROSCOPY WITH THERMACHOICE ABLATION  04/28/2012   Procedure: DILATATION & CURETTAGE/HYSTEROSCOPY WITH THERMACHOICE ABLATION;  Surgeon: Cyril Mourning, MD;  Location: [facility];  Service: Gynecology;  Laterality: N/A;  . ERCP  03/03/2012   Procedure: ENDOSCOPIC RETROGRADE CHOLANGIOPANCREATOGRAPHY (ERCP);  Surgeon: Beryle Beams, MD;  [facility]COPY;  Service: Endoscopy;  Laterality: N/A;  dl/hung  . LAPAROSCOPY  2011  . [place] SURGERY  2003; 2008; 2009  . MULTIPLE TOOTH EXTRACTIONS  "scattered dates"  . POSTERIOR LUMBAR FUSION  2010  . REDUCTION MAMMAPLASTY Bilateral 02/27/2015  . TUBAL LIGATION  2013  . WISDOM TOOTH EXTRACTION  2001    There were no vitals filed for this visit.  Subjective Assessment - 01/17/19 1234    Subjective  Ann has decided to "ride it out" about her attention and not inquire to MD about attention med.    Currently in Pain?  No/denies            ADULT SLP TREATMENT - 01/17/19 1235      General Information   Behavior/Cognition  Alert;Cooperative;Pleasant mood      Treatment Provided   Treatment  provided  Cognitive-Linquistic      Cognitive-Linquistic Treatment   Treatment focused on  Cognition    Skilled Treatment  "Ever since they gave me the steroids in the hospital, things have slowly gotten back to normal." Ann is fine with d/c today. SLP provided Ann some written information about local MS Society chapter as well as [place] MS Society, signs/sx of aspiration PNA, and signs of dysphagia. SLP reviewed what is necessary to receive services here in the future (MD script). Ann was satisfied with ST and her current level.       Assessment / Recommendations / Plan   Plan  Discharge SLP treatment due to (comment)   Ann satisfied with current  progress/level     Progression Toward Goals   Progression toward goals  Goals met, education completed, Ann discharged from [facility] - 01/17/19 1243    Education Details  overt s/s aspiration PNA, s/sx dysphagia, requirement for referral    Person(s) Educated  Ann    Methods  Explanation;Handout    Comprehension  Verbalized understanding       SLP Short Term Goals - 01/17/19 1202      SLP SHORT TERM GOAL #1   Title  Ann [name] tell SLP 2 overt s/sx aspiration and 3 s/sx aspiration PNA with modified independence    Time  --    Period  --    Status  Achieved      SLP SHORT TERM GOAL #2   Title  Ann [name] demo 10 minutes of mod complex conversation using compensations for speech intelligibility, overt three sessions    Baseline  01-11-19; 01-17-19    Time  --    Period  --    Status  Partially Met      SLP SHORT TERM GOAL #3   Title  assess Ann's cognitive linguistics if deemed clinically appropriate in the first 3 sessions    Status  Achieved       SLP Long Term Goals - 01/17/19 [place] #1   Title  Ann [name] engage in 20 minutes mod complex/complex conversation (work-like conversation) with successful use of compensations over three sessions    Baseline  01-17-19    Time  --    Period  --    Status  Partially Met      SLP LONG TERM GOAL #2   Title  Ann [name] complete dysarthria HEP (tongue twisters, etc.) independently in two sessions    Time  --    Period  --    Status  Deferred       Plan - 01/17/19 1245    Clinical Impression Statement  Ann presents today with speech sounding WNL, with premorbid but reportedly worse attention skills than prior to dx of MS. See "skilled intervention" for more details of today's session. Ann is comfortable with d/c today.    Treatment/Interventions  Aspiration precaution training;Pharyngeal strengthening exercises;Diet toleration management by SLP;Internal/external aids;Ann/family  education;Compensatory strategies;Cueing hierarchy;SLP instruction and feedback;Multimodal communcation approach;Trials of upgraded texture/liquids;Functional tasks;Oral motor exercises    Potential to Achieve Goals  Good    Consulted and Agree with Plan of Care  Ann       Ann [name] benefit from skilled therapeutic intervention in order to improve the following deficits and impairments:   Dysarthria and anarthria  Cognitive communication deficit  Dysphagia, unspecified type   SPEECH THERAPY DISCHARGE SUMMARY  Visits from Start of Care: 4  Current functional level related to goals / functional outcomes: See goal summary above. Some of Ann's goals became deferred over the course of ST. Ann's skills returned to baseline except for her attention.    Remaining deficits: Attention.   Education / Equipment: Overt s/sx aspiration PNA, s/sx dysphagia, local and national MS society   Plan: Ann agrees to discharge.  Ann goals were partially met. Ann is being discharged due to being pleased with the current functional level.  ?????       Problem List Ann Active Problem List   Diagnosis Date Noted  . Multiple sclerosis ([place]) 12/27/2018  . Dysesthesia 12/27/2018  . Gait disturbance 12/27/2018  . Slurred speech 12/13/2018  . Anxiety 12/13/2018  . Breast hypertrophy in female 02/27/2015  . Hyponatremia 03/04/2012  . Acute cholecystitis 03/02/2012  . Morbid obesity ([place]) 08/25/2011  . Von Willebrand disease - hx PPH 04/14/2011  . History of abnormal Pap smear - had colpo 04/14/2011  . Infertility, female 04/14/2011  . HSV-2 infection - history, no outbreaks 04/14/2011  . Anemia 04/14/2011  . Asthma - uses inhaler PRN 04/14/2011  . History of pyelonephritis - frequent history as a child 04/14/2011  . Migraine 04/14/2011  . History of depression 04/14/2011  . History of smoking 04/14/2011    [facility] ,[place], [place]  01/17/2019, 12:48 PM  [facility] [address] [place], [place], [address] Phone: [phone]   Fax:  [phone]   Name: Ann [name] MRN: [id number] Date of Birth: [birth year]

## 2019-01-18 LAB — VON WILLEBRAND PANEL
Coagulation Factor VIII: 77 % (ref 56–140)
Ristocetin Co-factor, Plasma: 29 % — ABNORMAL LOW (ref 50–200)
Von Willebrand Antigen, Plasma: 60 % (ref 50–200)

## 2019-01-18 LAB — COAG STUDIES INTERP REPORT

## 2019-01-23 ENCOUNTER — Other Ambulatory Visit: Payer: Self-pay | Admitting: Neurology

## 2019-01-23 DIAGNOSIS — G35 Multiple sclerosis: Secondary | ICD-10-CM

## 2019-02-03 LAB — VON WILLEBRAND FACTOR MULTIMER

## 2019-02-05 ENCOUNTER — Telehealth: Payer: Self-pay | Admitting: Hematology

## 2019-02-05 NOTE — Telephone Encounter (Signed)
Confirmed appt and verified info. °

## 2019-02-05 NOTE — Progress Notes (Signed)
HEMATOLOGY/ONCOLOGY CLINIC NOTE  Date of Service: 02/05/2019  Patient Care Team: Simona Huh, NP as PCP - General (Nurse Practitioner)  CHIEF COMPLAINTS/PURPOSE OF CONSULTATION:  Von Willebrand's disease  HISTORY OF PRESENTING ILLNESS:   Ann [name] is a wonderful 38 y.o. female who has been referred to [place] by Dr Simona Huh for evaluation and management of VonWillebrand's disease type 1A. The pt reports that she is doing well overall.   The pt reports that she was recently diagnosed with Multiple Sclerosis. She was experiencing slurred speech and went to the hospital. They then put her on 5 days of steroids and her symptoms subsided. She still experiences speech issues, body aches and fatigue occasionally. Her current Neurologist is Dr. Felecia Shelling at [facility], who plans on putting her on infusion therapy.   Her mother has bleeding issues and bruises frequently and one of her mother's siblings has similar issues. Neither of her parents have been tested for Von Willebrand's disease. She was diagnosed with Von Willebrand's in 2005 due to C-section related excess bleeding. Since she was younger she had excess nose bleeds before her menstruation, which improved after her cycle was over. She began birth control soon after she began mensturating but it did not help the bleeding. She remained on birth control until she was 60. She has been pregnant 3 times and has two children, one pregnancy ended in a planned abortion. Both of her children were delivered by C-section. Both have also been tested for Von Willebrand's disease. Her daughter tested positive and her son tested negative but only blood tests were used to diagnose. Her son does have sickle cell trait. She did not have any excessive bleeding with her second C-section, gallbladder surgery or back surgery. She has not required Stimate outside of surgery nor Amicar. Pt had a D&C/ablation in 2013 and has had controlled  bleeding since. Her daughter had an extraction without Stimate and did not have any excessive bleeding issues. In 2017 pt received a laceration from a pencil sharpener, bled for 6 hours and needed stiches for her injuries. She has no upcoming surgeries.   Most recent lab results (01/01/2019) of CBC is as follows: all values are WNL except for Lymphs Abs at 3.3K, BUN/Creatinine Ratio at 8, Sodium at 150, Chloride at 109, CO2 at 18. 01/01/2019 Urinalysis shows all values are WNL except for pH at 8.5.  01/01/2019 hCG is negative.  01/01/2019 Hepatitis B Core AB is negative.  01/01/2019 IgG, IgA, IgM shows all results are WNL.   On review of systems, pt reports fatigue, body aches, speech issues and denies abdominal pain and any other symptoms.   On PMHx the pt reports two C-sections, back surgery, gallbladder surgery, D&C ablation On Social Hx the pt reports no smoking, low alcohol use  On Family Hx the pt reports no diagnosed Von Willebrand's in mother or father, daughter diagnosed with Von Willebrand's   INTERVAL HISTORY:   I connected with  Ann [name] on 02/05/19 by telephone and verified that I am speaking with the correct person using two identifiers.   I discussed the limitations of evaluation and management by telemedicine. The patient expressed understanding and agreed to proceed.  Other persons participating in the visit and their role in the encounter:    -Yevette Edwards, Medical Scribe   Patient's location: Home Provider's location: [facility] at [facility]  Ann [name] is a wonderful 38 y.o. female who is here for  evaluation and management of Von Willebrand's disease. The patient's last visit with us was on 01/16/2019. The pt reports that she is doing well overall.  The pt reports no new concerns in the interim. Pt does not recall every having a Stimate challenge test. Pt has concerns about her daughter who was also told that she has Von  Willebrand type 1. She is about to begin an experimental drug for her MS. Her MS has been stable.   Lab results (01/16/19) of CBC w/diff and CMP is as follows: all values are WNL.  01/16/2019 Ferritin at 84 01/16/2019 Vitamin B12 at 331 01/16/2019 VW Panel is as follows: Coagulation Factor VIII at 77, Ristocetin Co-factor at 29, VW Antigen at 60 01/16/2019 VWF Multimeric shows "VWF multimer analysis demonstrates a normal pattern and distribution of bands. This is the pattern of multimers  that occurs in normal individuals as well as in those with type 1 von Willebrand disease (VWD), type 37M and type 2N VWD. Some acquired von Willebrand syndrome cases may yield  a normal pattern as well." 01/16/2019 Coag Studies Interp Report shows "The VWF:Ag is normal. The VWF:RCo is decreased. The FVIII is normal."  On review of systems, pt denies and any other symptoms.   MEDICAL HISTORY:  Past Medical History:  Diagnosis Date  . ADD (attention deficit disorder)    takes Adderall daily  . Anemia   . Anxiety    takes Xanax daily as needed  . Asthma    mild case per pt  . Chronic lower back pain   . Depression    has meds prescribed but doesn't take them  . Family history of adverse reaction to anesthesia    "Mom gets PONV too"  . Headache    "monthly" (02/27/2015)  . Hemorrhoids   . History of esophagogastroduodenoscopy (EGD)   . Migraine    "none in the last 6 months" (02/27/2015)  . Nocturia   . Pneumonia "several times"  . PONV (postoperative nausea and vomiting)   . Von Willebrand disease ([facility])    Dr. Cyndie ChimeGranfortuna    SURGICAL HISTORY: Past Surgical History:  Procedure Laterality Date  . BACK SURGERY    . BREAST CYST EXCISION Left 02/20/2018   Procedure: EXCISION OF LEFT BREAST SEBACEOUS CYST;  Surgeon: Emelia LoronWakefield, Matthew, MD;  Location: [facility];  Service: General;  Laterality: Left;  . BREAST REDUCTION SURGERY Bilateral 02/27/2015   Procedure: BILATERAL BREAST  REDUCTION WITH FREE NIPPLE GRAFT TECHNIQUE ON RIGHT BREAST;  Surgeon: Etter Sjogrenavid Bowers, MD;  [facility];  Service: Plastics;  Laterality: Bilateral;  . CESAREAN SECTION  2004; 2013  . CHOLECYSTECTOMY  03/02/2012   Procedure: LAPAROSCOPIC CHOLECYSTECTOMY WITH INTRAOPERATIVE CHOLANGIOGRAM;  Surgeon: Currie Parishristian J Streck, MD;  Location: [facility];  Service: General;  Laterality: N/A;  . DILATION AND CURETTAGE OF UTERUS  ~ 1997; ~ 2005  . DILITATION & CURRETTAGE/HYSTROSCOPY WITH THERMACHOICE ABLATION  04/28/2012   Procedure: DILATATION & CURETTAGE/HYSTEROSCOPY WITH THERMACHOICE ABLATION;  Surgeon: Jeani HawkingMichelle L Grewal, MD;  Location: [facility];  Service: Gynecology;  Laterality: N/A;  . ERCP  03/03/2012   Procedure: ENDOSCOPIC RETROGRADE CHOLANGIOPANCREATOGRAPHY (ERCP);  Surgeon: Theda BelfastPatrick D Hung, MD;  [facility];  Service: Endoscopy;  Laterality: N/A;  dl/hung  . LAPAROSCOPY  2011  . LUMBAR DISC SURGERY  2003; 2008; 2009  . MULTIPLE TOOTH EXTRACTIONS  "scattered dates"  . POSTERIOR LUMBAR FUSION  2010  . REDUCTION MAMMAPLASTY Bilateral 02/27/2015  . TUBAL LIGATION  2013  .  WISDOM TOOTH EXTRACTION  2001    SOCIAL HISTORY: Social History   Socioeconomic History  . Marital status: Married    Spouse name: Not on file  . Number of children: Not on file  . Years of education: Not on file  . Highest education level: Not on file  Occupational History  . Occupation: regulatory affairs  Social Needs  . Financial resource strain: Not on file  . Food insecurity    Worry: Not on file    Inability: Not on file  . Transportation needs    Medical: Not on file    Non-medical: Not on file  Tobacco Use  . Smoking status: Former Smoker    Packs/day: 0.25    Years: 2.00    Pack years: 0.50    Types: Cigarettes    Quit date: 2000    Years since quitting: 20.7  . Smokeless tobacco: Never Used  Substance and Sexual Activity  . Alcohol use: Yes    Alcohol/week: 0.0 standard drinks    Comment:  social  . Drug use: Yes    Types: Marijuana    Comment: occasional use, last use last week  . Sexual activity: Yes    Birth control/protection: None    Comment: BTL  Lifestyle  . Physical activity    Days per week: Not on file    Minutes per session: Not on file  . Stress: Not on file  Relationships  . Social Musicianconnections    Talks on phone: Not on file    Gets together: Not on file    Attends religious service: Not on file    Active member of club or organization: Not on file    Attends meetings of clubs or organizations: Not on file    Relationship status: Not on file  . Intimate partner violence    Fear of current or ex partner: Not on file    Emotionally abused: Not on file    Physically abused: Not on file    Forced sexual activity: Not on file  Other Topics Concern  . Not on file  Social History Narrative   Lives with husband and kids   Caffeine use:    16 oz per day   Right handed    FAMILY HISTORY: Family History  Problem Relation Age of Onset  . Stroke Mother   . Arthritis Mother   . Depression Mother   . COPD Father   . Kidney disease Paternal Uncle   . Stroke Maternal Grandfather   . Heart disease Paternal Grandfather   . Multiple sclerosis Neg Hx     ALLERGIES:  is allergic to compazine [prochlorperazine edisylate]; darvocet [propoxyphene n-acetaminophen]; codeine; and sulfa antibiotics.  MEDICATIONS:  Current Outpatient Medications  Medication Sig Dispense Refill  . ALPRAZolam (XANAX) 1 MG tablet Take 0.5 mg by mouth 2 (two) times daily as needed for anxiety.     Marland Kitchen. BIOTIN PO Take 1 Dose by mouth daily.    . Cyanocobalamin (B-12 PO) Take 1 Dose by mouth daily.    Marland Kitchen. etodolac (LODINE XL) 400 MG 24 hr tablet Take 400 mg by mouth 2 (two) times daily as needed for pain.    Marland Kitchen. gabapentin (NEURONTIN) 300 MG capsule One po qAm, one po qPM and two po QHS 120 capsule 11  . meclizine (ANTIVERT) 25 MG tablet Take 25 mg by mouth 3 (three) times daily as needed for  dizziness.    . traMADol (ULTRAM) 50 MG tablet Take  2 tablets (100 mg total) by mouth daily as needed for severe pain (headache). 5 tablet 0  . Vitamin D, Ergocalciferol, (DRISDOL) 1.25 MG (50000 UT) CAPS capsule Take 50,000 Units by mouth every 7 (seven) days.     No current facility-administered medications for this visit.     REVIEW OF SYSTEMS:    A 10+ POINT REVIEW OF SYSTEMS WAS OBTAINED including neurology, dermatology, psychiatry, cardiac, respiratory, lymph, extremities, GI, GU, Musculoskeletal, constitutional, breasts, reproductive, HEENT.  All pertinent positives are noted in the HPI.  All others are negative.   PHYSICAL EXAMINATION: ECOG PERFORMANCE STATUS: 1 - Symptomatic but completely ambulatory  . There were no vitals filed for this visit. There were no vitals filed for this visit. .There is no height or weight on file to calculate BMI.  Phone visit  LABORATORY DATA:  I have reviewed the data as listed  . CBC Latest Ref Rng & Units 01/16/2019 01/01/2019 12/12/2018  WBC 4.0 - 10.5 K/uL 7.9 9.0 -  Hemoglobin 12.0 - 15.0 g/dL 76.7 20.9 47.0  Hematocrit 36.0 - 46.0 % 38.5 37.0 42.0  Platelets 150 - 400 K/uL 319 250 -    . CMP Latest Ref Rng & Units 01/16/2019 01/01/2019 12/12/2018  Glucose 70 - 99 mg/dL 92 88 90  BUN 6 - 20 mg/dL 8 7 7   Creatinine 0.44 - 1.00 mg/dL 9.62 8.36  Sodium 135 - 145 mmol/L 140 150(H) 141  Potassium 3.5 - 5.1 mmol/L 4.0 4.5 3.7  Chloride 98 - 111 mmol/L 106 109(H) 106  CO2 22 - 32 mmol/L 26 18(L) -  Calcium 8.9 - 10.3 mg/dL 9.5 6.29 -  Total Protein 6.5 - 8.1 g/dL 7.6 7.6 -  Total Bilirubin 0.3 - 1.2 mg/dL 0.4 47.6 -  Alkaline Phos 38 - 126 U/L 59 75 -  AST 15 - 41 U/L 15 17 -  ALT 0 - 44 U/L 14 19 -     RADIOGRAPHIC STUDIES: I have personally reviewed the radiological images as listed and agreed with the findings in the report. No results found.  ASSESSMENT & PLAN:    38 yo with h/o VWD type 1 and recently diagnosed MS  1)  Von Willebrand disease   PLAN: -Discussed pt labwork, 01/16/19; all values are WNL.  -Discussed 01/16/2019 Ferritin at 84 -Discussed 01/16/2019 Vitamin B12 at 331 -Discussed 01/16/2019 VW Panel is as follows: Coagulation Factor VIII at 77, Ristocetin Co-factor at 29, VW Antigen at 60 -Discussed 01/16/2019 VWF Multimeric shows "VWF multimer analysis demonstrates a normal pattern and distribution of bands. This is the pattern of multimers  that occurs in normal individuals as well as in those with type 1 von Willebrand disease (VWD), type 25M and type 2N VWD. Some acquired von Willebrand syndrome cases may yield a normal pattern as well." -Discussed 01/16/2019 Coag Studies Interp Report shows "The VWF:Ag is normal. The VWF:RCo is decreased. The FVIII is normal." -Recommended avoidance of OTC pain medications, NSAIDS, alcohol use due to increased bleeding risks  -Discussed that certain MS medications could decrease platelet counts  -Has been a stimate responder in the past and has needed this with previous C -section and gallbladder surgery. -[place] with dental procedures -Most likely Type 2 (M or N) Von Willebrand disease based on labs  -Will need ulcer preventative medication if placed on long-term high dose steroids for MS  -Advised pt to follow up with pediatric hematologist for her daughter's Von Willebrand's disease  -Will see back in a  year with labs    FOLLOW UP: RTC with Dr Candise Che with labs in 12 months   The total time spent in the appt was and more than 50% was on counseling and direct patient cares.  All of the patient's questions were answered with apparent satisfaction. The patient knows to call the clinic with any problems, questions or concerns.   Wyvonnia Lora MD MS AAHIVMS [facility] Hematology/Oncology Physician [facility]  (Office):       [phone] (Work cell):  [phone] (Fax):           [phone]  02/05/2019 10:55 PM  I, Carollee Herter, am acting as a scribe for Dr. Wyvonnia Lora.   .I have reviewed the above documentation for accuracy and completeness, and I agree with the above. Johney Maine MD

## 2019-02-06 ENCOUNTER — Other Ambulatory Visit: Payer: Self-pay

## 2019-02-06 ENCOUNTER — Inpatient Hospital Stay (HOSPITAL_BASED_OUTPATIENT_CLINIC_OR_DEPARTMENT_OTHER): Payer: No Typology Code available for payment source | Admitting: Hematology

## 2019-02-06 ENCOUNTER — Telehealth: Payer: Self-pay | Admitting: Hematology

## 2019-02-06 ENCOUNTER — Encounter: Payer: Self-pay | Admitting: Neurology

## 2019-02-06 ENCOUNTER — Ambulatory Visit (INDEPENDENT_AMBULATORY_CARE_PROVIDER_SITE_OTHER): Payer: No Typology Code available for payment source | Admitting: Neurology

## 2019-02-06 ENCOUNTER — Ambulatory Visit: Payer: Self-pay | Admitting: Neurology

## 2019-02-06 DIAGNOSIS — D6800 Von Willebrand disease, unspecified: Secondary | ICD-10-CM

## 2019-02-06 DIAGNOSIS — G35 Multiple sclerosis: Secondary | ICD-10-CM

## 2019-02-06 MED ORDER — PHENTERMINE HCL 37.5 MG PO CAPS: 37.5000 mg | ORAL_CAPSULE | ORAL | 5 refills | Status: DC

## 2019-02-06 NOTE — Progress Notes (Signed)
[facility]  PATIENT: Ann [name] DOB: May 21, 1980  REFERRING DOCTOR OR PCP: Courtney Paris (PCP); Ritta Slot (neuro hospitalist) SOURCE: Patient, notes from recent hospital admission, lab reports, imaging reports, MRI images personally reviewed  _________________________________   HISTORICAL  CHIEF COMPLAINT:  Chief Complaint  Patient presents with  . Follow-up    RM 12, alone. Here for screening for chimes study. Pt reports she does not take gabapentin d/t potential SE of weight gain that she had in the past with it.     HISTORY OF PRESENT ILLNESS:  Ann [name] is a 38 y.o. woman with MS diagnosed 12/2018.     Update 02/06/2019: She has more tingling in the limbs like they are going to sleep.   She also is feeling more tired.   She is tripping some but no falls.    She feels gait is slightly better than last visit.   She has some urinary urgency, no worse than last visit.  She notes fatigue and mildly reduced attention.   She was on Adderall in the past for ADD and phentermine for weight loss and both of those helped her focus/attention.  She has von Willebrand's disease but does not require treatment,   She was diagnosed in 2005.  In 2004, she had bleeding postpartum and needed medical treatment with DDAVP.   She also was given prophylactic DDAVP for gallbladder surgery and breast reduction surgery.   She has never had an issue with IV's or blood draws.    EDSS 2.5 (2 visual due to 20/30 OD, 2 cognitive (ftigue 2, cog 1), 1 coordination and 1 bladder)  From 12/21/2018. She is a 38 year old woman who had the onset of slurred speech 12/10/2018.   When symptoms persisted, she went to the ED a few days later.   Her Ct scan was normal.   The MRi was consistent with MS.    One focus enhanced helping to confirm the diagnosis.   MRI cervical spine did not show any MS plaque according to the official interpretation.  However, by my interpretation  there are 2 foci within the spinal cord, one medially slightly to the left adjacent to C3 and another focus adjacent to T3-T4 noted on the sagittal images but not covered on the axial views..    She had 5 days of IV Solumedrol and her speech was much better by the time she was discharged.       She actually had noted some clumsiness with her gait in early July.  She still feels a little clumsy though she is better than she was last month.  Of note, she has had dysesthesias in the chest since this event.  Also, she had right visual blurriness that started 5-6 years ago.   She doesn't recall an actual diagnosis but she did have intra-ocular steroid injections.  She has felt much more tired the last 2 months.    She is sleeping poorly.   She snores. She has never had witnessed OSA.    She had a sleep study that was 'inconclusive' last year.    She has 3 cousins with MS.   One was diagnosed in her 38's and the other two in their 80's.    Currently, gait is mildly off balanced.   She has had stumbles but no falls.   She denies weakness but the legs feel heavy and more sluggish.   She denies numbness or tingling in the limbs but does note some  in the left scalp near the vertex.  Additionally, she has painful dysesthesias in the trunk.  She is noting some issues with urinary frequency.  She had a single episode of urinary incontinence last month, shortly after the balance issues started.   She notes that vision is more blurry on the right and colors are desaturated OD.     She is mostly healthy but had menorrhagia and was diagnosed with Von Willebrand disease.    I personally reviewed the MRI of the brain performed 12/13/2018 and the MRI of the cervical spine performed 12/13/2018.    The MRI of the brain shows multiple T2/flair hyperintense foci in the periventricular, juxtacortical deep white matter consistent with demyelinating plaque.  One focus in the left deep/periventricular white matter enhanced after  contrast consistent with an acute demyelinating plaque.  This plaque was also hyperintense on diffusion-weighted images.  The brainstem was fine.  MRI of the cervical spine shows 2 plaques, one small focus ventromedially towards the left adjacent to C3 and another focus adjacent to T3-T4 (not covered on the axial views)  REVIEW OF SYSTEMS: Constitutional: No fevers, chills, sweats, or change in appetite.  She has fatigue. Eyes: No visual changes, double vision, eye pain Ear, nose and throat: No hearing loss, ear pain, nasal congestion, sore throat Cardiovascular: No chest pain, palpitations Respiratory: No shortness of breath at rest or with exertion.   No wheezes GastrointestinaI: No nausea, vomiting, diarrhea, abdominal pain, fecal incontinence Genitourinary: No dysuria, urinary retention or frequency.  No nocturia. Musculoskeletal: No neck pain, back pain Integumentary: No rash, pruritus, skin lesions Neurological: as above Psychiatric: No depression at this time.  No anxiety Endocrine: No palpitations, diaphoresis, change in appetite, change in weigh or increased thirst Hematologic/Lymphatic: No anemia, purpura, petechiae.  She has von Willebrand's disease not requiring any treatment Allergic/Immunologic: No itchy/runny eyes, nasal congestion, recent allergic reactions, rashes  ALLERGIES: Allergies  Allergen Reactions  . Compazine [Prochlorperazine Edisylate]     Jaws locked up per pt  . Darvocet [Propoxyphene N-Acetaminophen] Nausea And Vomiting  . Codeine Nausea And Vomiting and Rash  . Sulfa Antibiotics Rash    HOME MEDICATIONS:  Current Outpatient Medications:  .  ALPRAZolam (XANAX) 1 MG tablet, Take 0.5 mg by mouth 2 (two) times daily as needed for anxiety. , Disp: , Rfl:  .  BIOTIN PO, Take 1 Dose by mouth daily., Disp: , Rfl:  .  Cyanocobalamin (B-12 PO), Take 1 Dose by mouth daily., Disp: , Rfl:  .  etodolac (LODINE XL) 400 MG 24 hr tablet, Take 400 mg by mouth 2  (two) times daily as needed for pain., Disp: , Rfl:  .  Vitamin D, Ergocalciferol, (DRISDOL) 1.25 MG (50000 UT) CAPS capsule, Take 50,000 Units by mouth every 7 (seven) days., Disp: , Rfl:  .  phentermine 37.5 MG capsule, Take 1 capsule (37.5 mg total) by mouth every morning., Disp: 30 capsule, Rfl: 5 .  traMADol (ULTRAM) 50 MG tablet, Take 2 tablets (100 mg total) by mouth daily as needed for severe pain (headache). (Patient not taking: Reported on 02/06/2019), Disp: 5 tablet, Rfl: 0  PAST MEDICAL HISTORY: Past Medical History:  Diagnosis Date  . ADD (attention deficit disorder)    takes Adderall daily  . Anemia   . Anxiety    takes Xanax daily as needed  . Asthma    mild case per pt  . Chronic lower back pain   . Depression    has meds prescribed  but doesn't take them  . Family history of adverse reaction to anesthesia    "Mom gets PONV too"  . Headache    "monthly" (02/27/2015)  . Hemorrhoids   . History of esophagogastroduodenoscopy (EGD)   . Migraine    "none in the last 6 months" (02/27/2015)  . Nocturia   . Pneumonia "several times"  . PONV (postoperative nausea and vomiting)   . Von Willebrand disease (HCC)    Dr. Cyndie Chime    PAST SURGICAL HISTORY: Past Surgical History:  Procedure Laterality Date  . BACK SURGERY    . BREAST CYST EXCISION Left 02/20/2018   Procedure: EXCISION OF LEFT BREAST SEBACEOUS CYST;  Surgeon: Emelia Loron, MD;  Location: [facility];  Service: General;  Laterality: Left;  . BREAST REDUCTION SURGERY Bilateral 02/27/2015   Procedure: BILATERAL BREAST REDUCTION WITH FREE NIPPLE GRAFT TECHNIQUE ON RIGHT BREAST;  Surgeon: Etter Sjogren, MD;  Location: [facility] OR;  Service: Plastics;  Laterality: Bilateral;  . CESAREAN SECTION  2004; 2013  . CHOLECYSTECTOMY  03/02/2012   Procedure: LAPAROSCOPIC CHOLECYSTECTOMY WITH INTRAOPERATIVE CHOLANGIOGRAM;  Surgeon: Currie Paris, MD;  Location: [facility] OR;  Service: General;  Laterality: N/A;   . DILATION AND CURETTAGE OF UTERUS  ~ 1997; ~ 2005  . DILITATION & CURRETTAGE/HYSTROSCOPY WITH THERMACHOICE ABLATION  04/28/2012   Procedure: DILATATION & CURETTAGE/HYSTEROSCOPY WITH THERMACHOICE ABLATION;  Surgeon: Jeani Hawking, MD;  Location: [facility]S;  Service: Gynecology;  Laterality: N/A;  . ERCP  03/03/2012   Procedure: ENDOSCOPIC RETROGRADE CHOLANGIOPANCREATOGRAPHY (ERCP);  Surgeon: Theda Belfast, MD;  Location: [facility] ENDOSCOPY;  Service: Endoscopy;  Laterality: N/A;  dl/hung  . LAPAROSCOPY  2011  . LUMBAR DISC SURGERY  2003; 2008; 2009  . MULTIPLE TOOTH EXTRACTIONS  "scattered dates"  . POSTERIOR LUMBAR FUSION  2010  . REDUCTION MAMMAPLASTY Bilateral 02/27/2015  . TUBAL LIGATION  2013  . WISDOM TOOTH EXTRACTION  2001    FAMILY HISTORY: Family History  Problem Relation Age of Onset  . Stroke Mother   . Arthritis Mother   . Depression Mother   . COPD Father   . Kidney disease Paternal Uncle   . Stroke Maternal Grandfather   . Heart disease Paternal Grandfather   . Multiple sclerosis Neg Hx     SOCIAL HISTORY:  Social History   Socioeconomic History  . Marital status: Married    Spouse name: Not on file  . Number of children: Not on file  . Years of education: Not on file  . Highest education level: Not on file  Occupational History  . Occupation: regulatory affairs  Social Needs  . Financial resource strain: Not on file  . Food insecurity    Worry: Not on file    Inability: Not on file  . Transportation needs    Medical: Not on file    Non-medical: Not on file  Tobacco Use  . Smoking status: Former Smoker    Packs/day: 0.25    Years: 2.00    Pack years: 0.50    Types: Cigarettes    Quit date: 2000    Years since quitting: 20.7  . Smokeless tobacco: Never Used  Substance and Sexual Activity  . Alcohol use: Yes    Alcohol/week: 0.0 standard drinks    Comment: social  . Drug use: Yes    Types: Marijuana    Comment: occasional use, last use last week   . Sexual activity: Yes    Birth control/protection: None  Comment: BTL  Lifestyle  . Physical activity    Days per week: Not on file    Minutes per session: Not on file  . Stress: Not on file  Relationships  . Social Musicianconnections    Talks on phone: Not on file    Gets together: Not on file    Attends religious service: Not on file    Active member of club or organization: Not on file    Attends meetings of clubs or organizations: Not on file    Relationship status: Not on file  . Intimate partner violence    Fear of current or ex partner: Not on file    Emotionally abused: Not on file    Physically abused: Not on file    Forced sexual activity: Not on file  Other Topics Concern  . Not on file  Social History Narrative   Lives with husband and kids   Caffeine use:    16 oz per day   Right handed     PHYSICAL EXAM  Vitals:   02/06/19 1303  BP: 130/85  Pulse: 86  Temp: (!) 97.3 F (36.3 C)  SpO2: 96%  Weight: 245 lb 8 oz (111.4 kg)    Body mass index is 43.49 kg/m.   General: The patient is well-developed and well-nourished and in no acute distress  HEENT:  Head is [place]/AT.  Sclera are anicteric.    Neck: No carotid bruits are noted.  The neck is nontender.  Cardiovascular: The heart has a regular rate and rhythm with a normal S1 and S2. There were no murmurs, gallops or rubs.    Skin: Extremities are without rash or  edema.  Musculoskeletal:  Joints are nontender  Neurologic Exam  Mental status: The patient is alert and oriented x 3 at the time of the examination. The patient has apparent normal recent and remote memory, with an apparently normal attention span and concentration ability.   Speech is normal.  Cranial nerves: Extraocular movements are full. Pupils are equal, round, and reactive to light and accomodation.  Visual fields are full.  Facial symmetry is present. There is good facial sensation to soft touch bilaterally.Facial strength is normal.   Trapezius and sternocleidomastoid strength is normal. No dysarthria is noted.   No obvious hearing deficits are noted.  Motor:  Muscle bulk is normal.   Tone is normal. Strength is  5 / 5 in all 4 extremities.   Sensory: Sensory testing is intact to pinprick, soft touch and vibration sensation in all 4 extremities.  Coordination: Cerebellar testing reveals good finger-nose-finger and heel-to-shin bilaterally.  Gait and station: Station is normal.   Gait is normal. Tandem gait is mildly wide. Romberg is negative.   Reflexes: Deep tendon reflexes are symmetric and normal bilaterally.   Plantar responses are flexor.    DIAGNOSTIC DATA (LABS, IMAGING, TESTING) - I reviewed patient records, labs, notes, testing and imaging myself where available.  Lab Results  Component Value Date   WBC 7.9 01/16/2019   HGB 12.4 01/16/2019   HCT 38.5 01/16/2019   MCV 96.7 01/16/2019   PLT 319 01/16/2019      Component Value Date/Time   NA 140 01/16/2019 1137   NA 150 (H) 01/01/2019 1635   K 4.0 01/16/2019 1137   CL 106 01/16/2019 1137   CO2 26 01/16/2019 1137   GLUCOSE 92 01/16/2019 1137   BUN 8 01/16/2019 1137   BUN 7 01/01/2019 1635   CREATININE 0.85 01/16/2019  1137   CREATININE 0.59 09/07/2011 1801   CALCIUM 9.5 01/16/2019 1137   PROT 7.6 01/16/2019 1137   PROT 7.6 01/01/2019 1635   ALBUMIN 4.4 01/16/2019 1137   ALBUMIN 4.5 01/01/2019 1635   AST 15 01/16/2019 1137   ALT 14 01/16/2019 1137   ALKPHOS 59 01/16/2019 1137   BILITOT 0.4 01/16/2019 1137   GFRNONAA >60 01/16/2019 1137   GFRAA >60 01/16/2019 1137   Lab Results  Component Value Date   CHOL 185 12/14/2018   HDL 52 12/14/2018   LDLCALC 122 (H) 12/14/2018   TRIG 56 12/14/2018   CHOLHDL 3.6 12/14/2018   Lab Results  Component Value Date   HGBA1C 5.1 12/14/2018   Lab Results  Component Value Date   JXBJYNWG95 621 01/16/2019   Lab Results  Component Value Date   TSH 0.979 12/13/2018       ASSESSMENT AND  PLAN  1. Multiple sclerosis ([place])     1.   She will be screened for the [place] study.  MRI of the brain with/without and cervical spine with will be ordered. 2.   She has fatigue and reduced focus/attention.  In the past, she has felt better when she was on a stimulant (Adderall and phentermine have been used in the past).  I will start phentermine 37.5 mg.  This will also help with her obesity. 3.    I will see her back when she returns for her initial treatment visit.  Vontrell Pullman A. Felecia Shelling, MD, PhD, FAAN Certified in Neurology, Clinical Neurophysiology, Sleep Medicine, Pain Medicine and Neuroimaging Director, [place] at [facility] [address], [place], [place] [address] [phone]

## 2019-02-06 NOTE — Telephone Encounter (Signed)
Scheduled appt per 02/06/2019 los,  Sent a staff message and a calendar will be mailed out.

## 2019-02-07 ENCOUNTER — Telehealth: Payer: Self-pay | Admitting: *Deleted

## 2019-02-07 NOTE — Telephone Encounter (Signed)
Submitted PA phentermine on CMM. Key: [id number] - PA Case ID: [id number] - Rx #: [id number]. Waiting on determination from [facility]

## 2019-02-08 NOTE — Telephone Encounter (Signed)
Received fax that PA denied. Only approved if pt enrolled in the medical weight loss program administered by [facility]. I called pt and she will use goodrx coupon to get rx for around 18.88. Nothing further needed.

## 2019-02-08 NOTE — Telephone Encounter (Signed)
Called [facility] at [phone] because we received fax that they cx PA d/t denial on file from 11/07/2018 and to follow appeal letter sent previously. Spoke with Carlita. I informed them that pt was not under Dr. Garth Bigness care until 12/27/18 and this previous PA was not submitted by [place]. She states we will have to do another PA over the phone and whoever reviewed did not realize we were a different office/provider. I asked that they pull PA info that was just submitted on CMM and submit this. She placed me on hold while she did this. She submitted. Case # [id number]. Under review, waiting on determination.

## 2019-02-15 ENCOUNTER — Other Ambulatory Visit: Payer: Self-pay | Admitting: Neurology

## 2019-02-15 DIAGNOSIS — G35 Multiple sclerosis: Secondary | ICD-10-CM

## 2019-02-15 DIAGNOSIS — Z79899 Other long term (current) drug therapy: Secondary | ICD-10-CM

## 2019-02-15 NOTE — Progress Notes (Signed)
Labs for research ordered

## 2019-02-20 LAB — MISC LABCORP TEST (SEND OUT): Labcorp test code: 630468

## 2019-02-22 ENCOUNTER — Other Ambulatory Visit: Payer: Self-pay

## 2019-02-22 ENCOUNTER — Ambulatory Visit
Admission: RE | Admit: 2019-02-22 | Discharge: 2019-02-22 | Disposition: A | Discharge status: Home / Self Care | Payer: No Typology Code available for payment source | Source: Ambulatory Visit | Attending: Neurology | Admitting: Neurology

## 2019-02-22 DIAGNOSIS — G35 Multiple sclerosis: Secondary | ICD-10-CM

## 2019-02-22 MED ORDER — GADOTERIDOL 279.3 MG/ML IV SOLN
20.0000 mL | Freq: Once | INTRAVENOUS | Status: AC | PRN
  Administered 2019-02-22: 12:00:00 20 mL via INTRAVENOUS

## 2019-02-26 ENCOUNTER — Telehealth: Payer: Self-pay | Admitting: *Deleted

## 2019-02-26 NOTE — Telephone Encounter (Signed)
Called and spoke with pt about MRI results per Dr. Felecia Shelling note. Asked her to call me back if she does not hear from research to schedule first infusion. She verbalized understanding.

## 2019-02-26 NOTE — Telephone Encounter (Signed)
-----   Message from [name], MD sent at 02/22/2019  9:12 PM EDT ----- Please let her know that the MRI of the brain and spinal cord did not show any new lesions.  Research should be calling her within a couple days to set up the first infusion.

## 2019-06-05 ENCOUNTER — Telehealth: Payer: Self-pay | Admitting: Neurology

## 2019-06-05 MED ORDER — PHENTERMINE HCL 37.5 MG PO CAPS: 37.5000 mg | ORAL_CAPSULE | ORAL | 5 refills | Status: DC

## 2019-06-05 MED ORDER — SILODOSIN 4 MG PO CAPS: 4.0000 mg | ORAL_CAPSULE | Freq: Every day | ORAL | 3 refills | Status: DC

## 2019-06-05 NOTE — Telephone Encounter (Signed)
She is here today for her 12-week follow-up for the chimes study.  She tolerated Ocrevus infusions well.  She does not note any new symptoms.  She feels she is neurologically about the same as at the last visit.  Her EDSS score today is 2.5.  She scored 2 for visual acuity (20/40 OD and 20/20 OS).  Coordination subscore was 1 due to reduced tandem gait.  She has urinary hesitancy scoring a 1, and increased fatigue and mildly reduced cognition and depression scoring a 2 on that subscale.  25 foot timed walk was 5.35.  I will start her on tamsulosin for the urinary hesitancy.  She has recently started Lexapro for depression and will continue.  Phentermine has helped her fatigue some

## 2019-07-04 ENCOUNTER — Telehealth: Payer: Self-pay | Admitting: Neurology

## 2019-07-04 ENCOUNTER — Encounter: Payer: Self-pay | Admitting: *Deleted

## 2019-07-04 NOTE — Telephone Encounter (Signed)
Spoke with Dr. Epimenio Foot, he is ok with providing letter. I called pt to let her know. She will come tomorrow morning to pick up letter. Placed up front for pick up.

## 2019-07-04 NOTE — Telephone Encounter (Signed)
Dr. Epimenio Foot- are you ok with providing a letter for this?

## 2019-07-04 NOTE — Telephone Encounter (Signed)
Pt is requesting a letter to be excused from [employer] due to medical reasons, please call

## 2019-08-16 ENCOUNTER — Other Ambulatory Visit: Payer: Self-pay

## 2019-08-16 ENCOUNTER — Ambulatory Visit
Admission: RE | Admit: 2019-08-16 | Discharge: 2019-08-16 | Disposition: A | Discharge status: Home / Self Care | Payer: No Typology Code available for payment source | Source: Ambulatory Visit | Attending: Neurology | Admitting: Neurology

## 2019-08-16 DIAGNOSIS — G35 Multiple sclerosis: Secondary | ICD-10-CM

## 2019-08-16 MED ORDER — GADOTERIDOL 279.3 MG/ML IV SOLN
20.0000 mL | Freq: Once | INTRAVENOUS | Status: AC | PRN
  Administered 2019-08-16: 20 mL via INTRAVENOUS

## 2019-09-04 DIAGNOSIS — R030 Elevated blood-pressure reading, without diagnosis of hypertension: Secondary | ICD-10-CM | POA: Insufficient documentation

## 2019-09-04 DIAGNOSIS — E8881 Metabolic syndrome: Secondary | ICD-10-CM | POA: Insufficient documentation

## 2019-09-04 DIAGNOSIS — Z6841 Body Mass Index (BMI) 40.0 and over, adult: Secondary | ICD-10-CM | POA: Insufficient documentation

## 2019-09-04 DIAGNOSIS — E781 Pure hyperglyceridemia: Secondary | ICD-10-CM | POA: Insufficient documentation

## 2019-09-19 ENCOUNTER — Telehealth: Payer: Self-pay | Admitting: Neurology

## 2019-09-19 MED ORDER — BACLOFEN 10 MG PO TABS: ORAL_TABLET | ORAL | 5 refills | Status: DC

## 2019-09-19 NOTE — Telephone Encounter (Signed)
Lets do baclofen 10 mg  1/2 to 1 pill po tid #90  #5

## 2019-09-19 NOTE — Telephone Encounter (Signed)
Called patient back. She reports increased muscle spasms. They are more severe in her upper back/shoulder area. PCP prescribed flexeril/vicodin in the past so she tried taking these but they were ineffective. Sx started this past Saturday. She also has started Wellbutrin 08/31/19. Her Last Ocrevus infusion was around 08/23/19. Advised I will speak with MD and call back with his recommendation.

## 2019-09-19 NOTE — Telephone Encounter (Signed)
Pt called wanting to speak to RN or provider to discuss the tightness of her muscles through out her body. She states her body is so tight to the point that it is painful. Please advise.

## 2019-09-19 NOTE — Telephone Encounter (Signed)
Called pt back. Relayed Dr. Bonnita Hollow recommendation. She is agreeable to try this. I e-scribed rx to [facility] for her.

## 2019-10-11 ENCOUNTER — Telehealth: Payer: Self-pay | Admitting: Neurology

## 2019-10-11 NOTE — Telephone Encounter (Signed)
Called pt back. Advised per Dr. Epimenio Foot that she can get either the moderna or Pfizer covid-19 vaccine. She verbalized understanding.

## 2019-10-11 NOTE — Telephone Encounter (Signed)
Pt would like to know if Dr Epimenio Foot would recommend the covid vaccine for her and if so which one, please call

## 2019-11-09 DIAGNOSIS — F32A Depression, unspecified: Secondary | ICD-10-CM | POA: Insufficient documentation

## 2019-12-06 ENCOUNTER — Other Ambulatory Visit: Payer: Self-pay | Admitting: Neurology

## 2019-12-12 ENCOUNTER — Telehealth: Payer: Self-pay | Admitting: *Deleted

## 2019-12-12 NOTE — Telephone Encounter (Signed)
Received fax from [facility] that PA denied again. Medication only covered if pt is enrolled in the medical weight loss program by [facility]. Pt can continue to use goodrx coupon to fill prescription.

## 2019-12-12 NOTE — Telephone Encounter (Signed)
Submitted PA phentermine on CMM. Key: [id number]. Pt under: Annalise Marsh Dolly (no - between last name).  PA Case ID: [id number] - Rx #: [id number]. Waiting on determination from [facility].

## 2019-12-20 ENCOUNTER — Other Ambulatory Visit: Payer: Self-pay | Admitting: Neurology

## 2019-12-20 ENCOUNTER — Other Ambulatory Visit: Payer: Self-pay | Admitting: *Deleted

## 2019-12-20 MED ORDER — PHENTERMINE HCL 37.5 MG PO TABS: 37.5000 mg | ORAL_TABLET | Freq: Every day | ORAL | 5 refills | Status: DC

## 2019-12-24 NOTE — Telephone Encounter (Signed)
Submitted PA phentermine tablet on CMM.  Key: [id number] - PA Case ID: [id number] - Rx #: [id number]. Waiting on determination from [facility].

## 2019-12-25 NOTE — Telephone Encounter (Signed)
PA phentermine tablets denied via [facility]. Pt was using goodrx coupon to fill capsules and can use goodrx for tablets as well.

## 2020-01-15 ENCOUNTER — Other Ambulatory Visit: Payer: Self-pay | Admitting: Neurology

## 2020-01-15 DIAGNOSIS — G35 Multiple sclerosis: Secondary | ICD-10-CM

## 2020-01-17 ENCOUNTER — Ambulatory Visit (INDEPENDENT_AMBULATORY_CARE_PROVIDER_SITE_OTHER): Payer: 59 | Admitting: Neurology

## 2020-01-17 ENCOUNTER — Telehealth: Payer: Self-pay | Admitting: *Deleted

## 2020-01-17 ENCOUNTER — Other Ambulatory Visit: Payer: Self-pay

## 2020-01-17 ENCOUNTER — Encounter: Payer: Self-pay | Admitting: Neurology

## 2020-01-17 VITALS — BP 143/80 | HR 84 | Ht 63.0 in | Wt 250.0 lb

## 2020-01-17 DIAGNOSIS — M2550 Pain in unspecified joint: Secondary | ICD-10-CM

## 2020-01-17 DIAGNOSIS — F988 Other specified behavioral and emotional disorders with onset usually occurring in childhood and adolescence: Secondary | ICD-10-CM

## 2020-01-17 DIAGNOSIS — R5383 Other fatigue: Secondary | ICD-10-CM | POA: Diagnosis not present

## 2020-01-17 DIAGNOSIS — R269 Unspecified abnormalities of gait and mobility: Secondary | ICD-10-CM

## 2020-01-17 DIAGNOSIS — G35 Multiple sclerosis: Secondary | ICD-10-CM

## 2020-01-17 DIAGNOSIS — H2011 Chronic iridocyclitis, right eye: Secondary | ICD-10-CM

## 2020-01-17 MED ORDER — MELOXICAM 7.5 MG PO TABS: 7.5000 mg | ORAL_TABLET | Freq: Every day | ORAL | 5 refills | Status: DC

## 2020-01-17 MED ORDER — ARMODAFINIL 200 MG PO TABS: ORAL_TABLET | ORAL | 3 refills | Status: DC

## 2020-01-17 NOTE — Progress Notes (Signed)
[facility]  PATIENT: Ann [name] DOB: 1980-06-17  REFERRING DOCTOR OR PCP: Simona Huh (PCP); Roland Rack (neuro hospitalist) SOURCE: Patient, notes from recent hospital admission, lab reports, imaging reports, MRI images personally reviewed  _________________________________   HISTORICAL  CHIEF COMPLAINT:  Chief Complaint  Patient presents with  . Follow-up    RM 13, alone. Last seen 02/06/19  . Multiple Sclerosis    In CHIMES study. Here for possible MS flare up. Having increased fatigue, hand numbness, joint pain.   [employer] FMLA    Needing forms filled out    HISTORY OF PRESENT ILLNESS:  Ann [name] is a 39 y.o. woman with MS diagnosed 12/2018.     Update 01/17/2020: She is on Ocrevus and has tolerated it well.   She has an MRI scheduled in a couple weeks and infusion in 3-4 weeks.     She has noted more joint pain in her fingers.   Typing is a little harder.   She feels more fatigued.  Cognitively, she feels she has more word finding difficulty and less focus/attnetion.   She has more insomnia.   She tried melatonin and trazodone without much benefit.   She has taken some naps, though, during the day.  She is on phentermine and noted she had less fatigue while on it then when off.  She was on Adderall in the past for ADD and it helped her focus/attention.   She has more tingling in the limbs like they are going to sleep.   She also is feeling more tired.   She is tripping some but no falls.    She feels gait is slightly better than last visit.   She has some urinary urgency, no worse than last visit.  She has more hesitancy with her bladder though is usually able to empty.  Vision is about the same.    She has right uveitis.  15 years ago, she had elevated ESR and had a temporal artery biopsy (reportedly normal) and she had an LP.  She has von Willebrand's disease but does not require treatment,   She was diagnosed in 2005.  In 2004,  she had bleeding postpartum and needed medical treatment with DDAVP.   She also was given prophylactic DDAVP for gallbladder surgery and breast reduction surgery.   She has never had an issue with IV's or blood draws.   \ MS History: She had the onset of slurred speech 12/10/2018.   When symptoms persisted, she went to the ED a few days later.   Her Ct scan was normal.   The MRi was consistent with MS.    One focus enhanced helping to confirm the diagnosis.   MRI cervical spine did not show any MS plaque according to the official interpretation.  However, by my interpretation there are 2 foci within the spinal cord, one medially slightly to the left adjacent to C3 and another focus adjacent to T3-T4 noted on the sagittal images but not covered on the axial views..    She had 5 days of IV Solumedrol and her speech was much better by the time she was discharged.       She actually had noted some clumsiness with her gait in early July 2020.  She still feels a little clumsy though she is better than she was last month.  Of note, she has had dysesthesias in the chest since this event.  Also, she had right visual blurriness that started 5-6  years ago.   She doesn't recall an actual diagnosis but she did have intra-ocular steroid injections.   Imaging The MRI of the brain 12/12/200 shows multiple T2/flair hyperintense foci in the periventricular, juxtacortical deep white matter consistent with demyelinating plaque.  One focus in the left deep/periventricular white matter enhanced after contrast consistent with an acute demyelinating plaque.  This plaque was also hyperintense on diffusion-weighted images.  The brainstem was fine.  MRI of the cervical spine 12/13/2018 shows 2 plaques, one small focus ventromedially towards the left adjacent to C3 and another focus adjacent to T3-T4 (not covered on the axial views)  REVIEW OF SYSTEMS: Constitutional: No fevers, chills, sweats, or change in appetite.  She has  fatigue. Eyes: No visual changes, double vision, eye pain Ear, nose and throat: No hearing loss, ear pain, nasal congestion, sore throat Cardiovascular: No chest pain, palpitations Respiratory: No shortness of breath at rest or with exertion.   No wheezes GastrointestinaI: No nausea, vomiting, diarrhea, abdominal pain, fecal incontinence Genitourinary: No dysuria, urinary retention or frequency.  No nocturia. Musculoskeletal: multiple joint pain Integumentary: No rash, pruritus, skin lesions Neurological: as above Psychiatric: No depression at this time.  No anxiety Endocrine: No palpitations, diaphoresis, change in appetite, change in weigh or increased thirst Hematologic/Lymphatic: No anemia, purpura, petechiae.  She has von Willebrand's disease not requiring any treatment Allergic/Immunologic: No itchy/runny eyes, nasal congestion, recent allergic reactions, rashes  ALLERGIES: Allergies  Allergen Reactions  . Compazine [Prochlorperazine Edisylate]     Jaws locked up per pt  . Darvocet [Propoxyphene N-Acetaminophen] Nausea And Vomiting  . Codeine Nausea And Vomiting and Rash  . Sulfa Antibiotics Rash    HOME MEDICATIONS:  Current Outpatient Medications:  .  ALPRAZolam (XANAX) 1 MG tablet, Take 0.5 mg by mouth 2 (two) times daily as needed for anxiety. , Disp: , Rfl:  .  baclofen (LIORESAL) 10 MG tablet, 1/2-1 tablet by mouth three times daily, Disp: 90 tablet, Rfl: 5 .  BIOTIN PO, Take 1 Dose by mouth daily., Disp: , Rfl:  .  Cyanocobalamin (B-12 PO), Take 1 Dose by mouth daily., Disp: , Rfl:  .  FLUoxetine (PROZAC) 40 MG capsule, Take 40 mg by mouth at bedtime., Disp: , Rfl:  .  phentermine (ADIPEX-P) 37.5 MG tablet, Take 1 tablet (37.5 mg total) by mouth daily before breakfast., Disp: 30 tablet, Rfl: 5 .  Vitamin D, Ergocalciferol, (DRISDOL) 1.25 MG (50000 UT) CAPS capsule, Take 50,000 Units by mouth every 7 (seven) days., Disp: , Rfl:  .  Armodafinil 200 MG TABS, One po  qAM, Disp: 30 tablet, Rfl: 3 .  meloxicam (MOBIC) 7.5 MG tablet, Take 1 tablet (7.5 mg total) by mouth daily., Disp: 30 tablet, Rfl: 5  PAST MEDICAL HISTORY: Past Medical History:  Diagnosis Date  . ADD (attention deficit disorder)    takes Adderall daily  . Anemia   . Anxiety    takes Xanax daily as needed  . Asthma    mild case per pt  . Chronic lower back pain   . Depression    has meds prescribed but doesn't take them  . Family history of adverse reaction to anesthesia    "Mom gets PONV too"  . Headache    "monthly" (02/27/2015)  . Hemorrhoids   . History of esophagogastroduodenoscopy (EGD)   . Migraine    "none in the last 6 months" (02/27/2015)  . Nocturia   . Pneumonia "several times"  . PONV (postoperative nausea and vomiting)   .  Von Willebrand disease ([place])    Dr. Beryle Beams    PAST SURGICAL HISTORY: Past Surgical History:  Procedure Laterality Date  . BACK SURGERY    . BREAST CYST EXCISION Left 02/20/2018   Procedure: EXCISION OF LEFT BREAST SEBACEOUS CYST;  Surgeon: Rolm Bookbinder, MD;  Location: [facility];  Service: General;  Laterality: Left;  . BREAST REDUCTION SURGERY Bilateral 02/27/2015   Procedure: BILATERAL BREAST REDUCTION WITH FREE NIPPLE GRAFT TECHNIQUE ON RIGHT BREAST;  Surgeon: Crissie Reese, MD;  Location: [facility];  Service: Plastics;  Laterality: Bilateral;  . CESAREAN SECTION  2004; 2013  . CHOLECYSTECTOMY  03/02/2012   Procedure: LAPAROSCOPIC CHOLECYSTECTOMY WITH INTRAOPERATIVE CHOLANGIOGRAM;  Surgeon: Haywood Lasso, MD;  Location: [facility];  Service: General;  Laterality: N/A;  . DILATION AND CURETTAGE OF UTERUS  ~ 1997; ~ 2005  . DILITATION & CURRETTAGE/HYSTROSCOPY WITH THERMACHOICE ABLATION  04/28/2012   Procedure: DILATATION & CURETTAGE/HYSTEROSCOPY WITH THERMACHOICE ABLATION;  Surgeon: Cyril Mourning, MD;  Location: [facility];  Service: Gynecology;  Laterality: N/A;  . ERCP  03/03/2012   Procedure: ENDOSCOPIC  RETROGRADE CHOLANGIOPANCREATOGRAPHY (ERCP);  Surgeon: Beryle Beams, MD;  Location: [facility];  Service: Endoscopy;  Laterality: N/A;  dl/hung  . LAPAROSCOPY  2011  . [place] SURGERY  2003; 2008; 2009  . MULTIPLE TOOTH EXTRACTIONS  "scattered dates"  . POSTERIOR LUMBAR FUSION  2010  . REDUCTION MAMMAPLASTY Bilateral 02/27/2015  . TUBAL LIGATION  2013  . WISDOM TOOTH EXTRACTION  2001    FAMILY HISTORY: Family History  Problem Relation Age of Onset  . Stroke Mother   . Arthritis Mother   . Depression Mother   . COPD Father   . Kidney disease Paternal Uncle   . Stroke Maternal Grandfather   . Heart disease Paternal Grandfather   . Multiple sclerosis Neg Hx     SOCIAL HISTORY:  Social History   Socioeconomic History  . Marital status: Married    Spouse name: Not on file  . Number of children: Not on file  . Years of education: Not on file  . Highest education level: Not on file  Occupational History  . Occupation: regulatory affairs  Tobacco Use  . Smoking status: Former Smoker    Packs/day: 0.25    Years: 2.00    Pack years: 0.50    Types: Cigarettes    Quit date: 2000    Years since quitting: 21.7  . Smokeless tobacco: Never Used  Substance and Sexual Activity  . Alcohol use: Yes    Alcohol/week: 0.0 standard drinks    Comment: social  . Drug use: Yes    Types: Marijuana    Comment: occasional use, last use last week  . Sexual activity: Yes    Birth control/protection: None    Comment: BTL  Other Topics Concern  . Not on file  Social History Narrative   Lives with husband and kids   Caffeine use:    16 oz per day   Right handed   Social Determinants of Health   Financial Resource Strain:   . Difficulty of Paying Living Expenses: Not on file  Food Insecurity:   . Worried About Charity fundraiser in the Last Year: Not on file  . Ran Out of Food in the Last Year: Not on file  Transportation Needs:   . Lack of Transportation (Medical): Not on  file  . Lack of Transportation (Non-Medical): Not on file  Physical Activity:   .  Days of Exercise per Week: Not on file  . Minutes of Exercise per Session: Not on file  Stress:   . Feeling of Stress : Not on file  Social Connections:   . Frequency of Communication with Friends and Family: Not on file  . Frequency of Social Gatherings with Friends and Family: Not on file  . Attends Religious Services: Not on file  . Active Member of Clubs or Organizations: Not on file  . Attends Archivist Meetings: Not on file  . Marital Status: Not on file  Intimate Partner Violence:   . Fear of Current or Ex-Partner: Not on file  . Emotionally Abused: Not on file  . Physically Abused: Not on file  . Sexually Abused: Not on file     PHYSICAL EXAM  Vitals:   01/17/20 1313  BP: (!) 143/80  Pulse: 84  Weight: 250 lb (113.4 kg)  Height: $Remove'5\' 3"'GmcCSDl$  (1.6 m)    Body mass index is 44.29 kg/m.   General: The patient is well-developed and well-nourished and in no acute distress  HEENT:  Head is [place]/AT.  Sclera are anicteric.    Neck: No carotid bruits are noted.  The neck is nontender.  Cardiovascular: The heart has a regular rate and rhythm with a normal S1 and S2. There were no murmurs, gallops or rubs.    Skin: Extremities are without rash or  edema.  Musculoskeletal:  Joints are tender in hands  Neurologic Exam  Mental status: The patient is alert and oriented x 3 at the time of the examination. The patient has apparent normal recent and remote memory, with an apparently normal attention span and concentration ability.   Speech is normal.  Cranial nerves: Extraocular movements are full. Pupils are equal, round, and reactive to light and accomodation. Mildly reduced color vision OD.  Facial symmetry is present. There is good facial sensation to soft touch bilaterally.Facial strength is normal.  Trapezius and sternocleidomastoid strength is normal. No dysarthria is noted.   No  obvious hearing deficits are noted.  Motor:  Muscle bulk is normal.   Tone is normal. Strength is  5 / 5 in all 4 extremities.   Sensory: Sensory testing is intact to pinprick, soft touch and vibration sensation in all 4 extremities.  Coordination: Cerebellar testing reveals good finger-nose-finger and heel-to-shin bilaterally.  Gait and station: Station is normal.   Gait is normal. Tandem gait is wide. Romberg is negative.   Reflexes: Deep tendon reflexes are symmetric and normal bilaterally.      DIAGNOSTIC DATA (LABS, IMAGING, TESTING) - I reviewed patient records, labs, notes, testing and imaging myself where available.  Lab Results  Component Value Date   WBC 7.9 01/16/2019   HGB 12.4 01/16/2019   HCT 38.5 01/16/2019   MCV 96.7 01/16/2019   PLT 319 01/16/2019      Component Value Date/Time   NA 140 01/16/2019 1137   NA 150 (H) 01/01/2019 1635   K 4.0 01/16/2019 1137   CL 106 01/16/2019 1137   CO2 26 01/16/2019 1137   GLUCOSE 92 01/16/2019 1137   BUN 8 01/16/2019 1137   BUN 7 01/01/2019 1635   CREATININE 0.85 01/16/2019 1137   CREATININE 0.59 09/07/2011 1801   CALCIUM 9.5 01/16/2019 1137   PROT 7.6 01/16/2019 1137   PROT 7.6 01/01/2019 1635   ALBUMIN 4.4 01/16/2019 1137   ALBUMIN 4.5 01/01/2019 1635   AST 15 01/16/2019 1137   ALT 14 01/16/2019 1137  ALKPHOS 59 01/16/2019 1137   BILITOT 0.4 01/16/2019 1137   GFRNONAA >60 01/16/2019 1137   GFRAA >60 01/16/2019 1137   Lab Results  Component Value Date   CHOL 185 12/14/2018   HDL 52 12/14/2018   LDLCALC 122 (H) 12/14/2018   TRIG 56 12/14/2018   CHOLHDL 3.6 12/14/2018   Lab Results  Component Value Date   HGBA1C 5.1 12/14/2018   Lab Results  Component Value Date   VITAMINB12 331 01/16/2019   Lab Results  Component Value Date   TSH 0.979 12/13/2018       ASSESSMENT AND PLAN  1. Multiple sclerosis (HCC)   2. Pain in joint, multiple sites   3. Other fatigue   4. Gait disturbance   5.  Attention deficit disorder (ADD) in adult   6. Chronic uveitis, right     1.   For MS, she will continue Ocrevus.  She is in the chimes study.  Her next infusion is in several weeks and she will have an MRI in a couple weeks. 2.   I will add Nuvigil to the phentermine to see if that helps her fatigue and sleepiness  3.   She has joint pain in multiple joints.  I will start meloxicam and check some blood work.  4.   I will see her back when she returns for her next treatment visit.  She should call sooner if new or worsening neurologic symptoms.  Jordynn Marcella A. Felecia Shelling, MD, PhD, FAAN Certified in Neurology, Clinical Neurophysiology, Sleep Medicine, Pain Medicine and Neuroimaging Director, [place] [facility] [address], [place], [place] [address] [phone]

## 2020-01-17 NOTE — Telephone Encounter (Signed)
Gave completed/signed FMLA forms back to [employer] to process for pt. Asked they be sent asap as forms are due 01/23/20

## 2020-01-17 NOTE — Telephone Encounter (Signed)
Submitted PA armodafinil on CMM. Key: [id number]. Marked urgent. Waiting on determination from [facility].

## 2020-01-17 NOTE — Telephone Encounter (Signed)
Request Reference Number: [id number]. ARMODAFINIL TAB 200MG  is approved through 01/16/2021. Your patient may now fill this prescription and it will be covered.

## 2020-01-19 LAB — C-REACTIVE PROTEIN: CRP: 12 mg/L — ABNORMAL HIGH (ref 0–10)

## 2020-01-19 LAB — RHEUMATOID FACTOR: Rheumatoid fact SerPl-aCnc: <10 IU/mL (ref 0.0–13.9)

## 2020-01-19 LAB — ANA W/REFLEX: Anti Nuclear Antibody (ANA): NEGATIVE

## 2020-01-19 LAB — SEDIMENTATION RATE: Sed Rate: 26 mm/hr (ref 0–32)

## 2020-02-05 ENCOUNTER — Other Ambulatory Visit: Payer: Self-pay

## 2020-02-05 ENCOUNTER — Ambulatory Visit
Admission: RE | Admit: 2020-02-05 | Discharge: 2020-02-05 | Disposition: A | Discharge status: Home / Self Care | Payer: No Typology Code available for payment source | Source: Ambulatory Visit | Attending: Neurology | Admitting: Neurology

## 2020-02-05 DIAGNOSIS — G35 Multiple sclerosis: Secondary | ICD-10-CM

## 2020-02-05 MED ORDER — GADOTERIDOL 279.3 MG/ML IV SOLN
20.0000 mL | Freq: Once | INTRAVENOUS | Status: AC | PRN
  Administered 2020-02-05: 20 mL via INTRAVENOUS

## 2020-02-06 ENCOUNTER — Inpatient Hospital Stay: Payer: No Typology Code available for payment source

## 2020-02-06 ENCOUNTER — Inpatient Hospital Stay: Payer: No Typology Code available for payment source | Admitting: Hematology

## 2020-02-07 ENCOUNTER — Other Ambulatory Visit: Payer: Self-pay | Admitting: Neurology

## 2020-02-07 MED ORDER — HYDROXYZINE HCL 10 MG PO TABS: 10.0000 mg | ORAL_TABLET | Freq: Three times a day (TID) | ORAL | 5 refills | Status: DC | PRN

## 2020-02-07 NOTE — Progress Notes (Signed)
She is here for the CHIMES study.   No new neurologic issues but has had pruritis without rash.   Benadryl cream/spray had not helped.   I will have her try hydroxyzine.     Today was 20/30 OD  20/20 OS (2), reduced tandem gait (1), mild urinary urgency (1), mild depression and ambulatory 1  (EDSS 2.0)

## 2020-02-20 ENCOUNTER — Inpatient Hospital Stay: Payer: 59 | Admitting: Hematology

## 2020-02-20 ENCOUNTER — Inpatient Hospital Stay: Payer: 59 | Attending: Hematology

## 2020-03-24 ENCOUNTER — Telehealth: Payer: Self-pay | Admitting: Hematology

## 2020-03-24 NOTE — Telephone Encounter (Signed)
Called pt per 11/12 sch msg - no answer ,. Left message for patient to call back if appt is still needed

## 2020-03-25 ENCOUNTER — Other Ambulatory Visit: Payer: Self-pay

## 2020-03-25 ENCOUNTER — Inpatient Hospital Stay (HOSPITAL_BASED_OUTPATIENT_CLINIC_OR_DEPARTMENT_OTHER): Payer: 59 | Admitting: Hematology

## 2020-03-25 ENCOUNTER — Inpatient Hospital Stay: Payer: 59 | Attending: Hematology

## 2020-03-25 ENCOUNTER — Other Ambulatory Visit: Payer: Self-pay | Admitting: Hematology

## 2020-03-25 VITALS — BP 139/79 | HR 91 | Temp 97.2°F | Resp 18 | Ht 63.0 in | Wt 257.0 lb

## 2020-03-25 DIAGNOSIS — Z79899 Other long term (current) drug therapy: Secondary | ICD-10-CM | POA: Insufficient documentation

## 2020-03-25 DIAGNOSIS — F418 Other specified anxiety disorders: Secondary | ICD-10-CM | POA: Insufficient documentation

## 2020-03-25 DIAGNOSIS — D6800 Von Willebrand disease, unspecified: Secondary | ICD-10-CM

## 2020-03-25 DIAGNOSIS — G35 Multiple sclerosis: Secondary | ICD-10-CM | POA: Insufficient documentation

## 2020-03-25 DIAGNOSIS — R5383 Other fatigue: Secondary | ICD-10-CM | POA: Diagnosis not present

## 2020-03-25 DIAGNOSIS — Z791 Long term (current) use of non-steroidal anti-inflammatories (NSAID): Secondary | ICD-10-CM | POA: Insufficient documentation

## 2020-03-25 DIAGNOSIS — Z87891 Personal history of nicotine dependence: Secondary | ICD-10-CM | POA: Diagnosis not present

## 2020-03-25 LAB — CMP (CANCER CENTER ONLY)
ALT: 12 U/L (ref 0–44)
AST: 15 U/L (ref 15–41)
Albumin: 4.1 g/dL (ref 3.5–5.0)
Alkaline Phosphatase: 78 U/L (ref 38–126)
Anion gap: 8 (ref 5–15)
BUN: 8 mg/dL (ref 6–20)
CO2: 26 mmol/L (ref 22–32)
Calcium: 9.3 mg/dL (ref 8.9–10.3)
Chloride: 106 mmol/L (ref 98–111)
Creatinine: 0.78 mg/dL (ref 0.44–1.00)
GFR, Estimated: >60 mL/min (ref >60)
Glucose, Bld: 98 mg/dL (ref 70–99)
Potassium: 4.1 mmol/L (ref 3.5–5.1)
Sodium: 140 mmol/L (ref 135–145)
Total Bilirubin: 0.5 mg/dL (ref 0.3–1.2)
Total Protein: 7.7 g/dL (ref 6.5–8.1)

## 2020-03-25 LAB — CBC WITH DIFFERENTIAL/PLATELET
Abs Immature Granulocytes: 0.07 10*3/uL (ref 0.00–0.07)
Basophils Absolute: 0 10*3/uL (ref 0.0–0.1)
Basophils Relative: 0 %
Eosinophils Absolute: 0.2 10*3/uL (ref 0.0–0.5)
Eosinophils Relative: 2 %
HCT: 38.8 % (ref 36.0–46.0)
Hemoglobin: 12.5 g/dL (ref 12.0–15.0)
Immature Granulocytes: 1 %
Lymphocytes Relative: 24 %
Lymphs Abs: 2.4 10*3/uL (ref 0.7–4.0)
MCH: 30.8 pg (ref 26.0–34.0)
MCHC: 32.2 g/dL (ref 30.0–36.0)
MCV: 95.6 fL (ref 80.0–100.0)
Monocytes Absolute: 0.6 10*3/uL (ref 0.1–1.0)
Monocytes Relative: 6 %
Neutro Abs: 6.8 10*3/uL (ref 1.7–7.7)
Neutrophils Relative %: 67 %
Platelets: 351 10*3/uL (ref 150–400)
RBC: 4.06 MIL/uL (ref 3.87–5.11)
RDW: 12.7 % (ref 11.5–15.5)
WBC: 10 10*3/uL (ref 4.0–10.5)
nRBC: 0 % (ref 0.0–0.2)

## 2020-03-25 LAB — FERRITIN: Ferritin: 60 ng/mL (ref 11–307)

## 2020-03-25 NOTE — Progress Notes (Signed)
HEMATOLOGY/ONCOLOGY CLINIC NOTE  Date of Service: 03/25/2020  Patient Care Team: Courtney Paris, NP as PCP - General (Nurse Practitioner)  CHIEF COMPLAINTS/PURPOSE OF CONSULTATION:  Von Willebrand's disease  HISTORY OF PRESENTING ILLNESS:   Ann [name] is a wonderful 39 y.o. female who has been referred to [place] by Dr Courtney Paris for evaluation and management of VonWillebrand's disease type 1A. The pt reports that she is doing well overall.   The pt reports that she was recently diagnosed with Multiple Sclerosis. She was experiencing slurred speech and went to the hospital. They then put her on 5 days of steroids and her symptoms subsided. She still experiences speech issues, body aches and fatigue occasionally. Her current Neurologist is Dr. Epimenio Foot at [facility], who plans on putting her on infusion therapy.   Her mother has bleeding issues and bruises frequently and one of her mother's siblings has similar issues. Neither of her parents have been tested for Von Willebrand's disease. She was diagnosed with Von Willebrand's in 2005 due to C-section related excess bleeding. Since she was younger she had excess nose bleeds before her menstruation, which improved after her cycle was over. She began birth control soon after she began mensturating but it did not help the bleeding. She remained on birth control until she was 89. She has been pregnant 3 times and has two children, one pregnancy ended in a planned abortion. Both of her children were delivered by C-section. Both have also been tested for Von Willebrand's disease. Her daughter tested positive and her son tested negative but only blood tests were used to diagnose. Her son does have sickle cell trait. She did not have any excessive bleeding with her second C-section, gallbladder surgery or back surgery. She has not required Stimate outside of surgery nor Amicar. Pt had a D&C/ablation in 2013 and has had controlled  bleeding since. Her daughter had an extraction without Stimate and did not have any excessive bleeding issues. In 2017 pt received a laceration from a pencil sharpener, bled for 6 hours and needed stiches for her injuries. She has no upcoming surgeries.   Most recent lab results (01/01/2019) of CBC is as follows: all values are WNL except for Lymphs Abs at 3.3K, BUN/Creatinine Ratio at 8, Sodium at 150, Chloride at 109, CO2 at 18. 01/01/2019 Urinalysis shows all values are WNL except for pH at 8.5.  01/01/2019 hCG is negative.  01/01/2019 Hepatitis B Core AB is negative.  01/01/2019 IgG, IgA, IgM shows all results are WNL.   On review of systems, pt reports fatigue, body aches, speech issues and denies abdominal pain and any other symptoms.   On PMHx the pt reports two C-sections, back surgery, gallbladder surgery, D&C ablation On Social Hx the pt reports no smoking, low alcohol use  On Family Hx the pt reports no diagnosed Von Willebrand's in mother or father, daughter diagnosed with Von Willebrand's   INTERVAL HISTORY:   Ann [name] is a wonderful 39 y.o. female who is here for evaluation and management of Von Willebrand's disease. The patient's last visit with [place] was on 02/06/2019. The pt reports that she is doing well overall.  The pt reports that she is interested in getting a gastric sleeve at [facility] next year. Pt used IV DDAVP for her C-section and Cholecystectomy. Pt had a breast reduction surgery three years ago and did not require any clotting promoter. She has recently been spotting and denies any significant menstrual  losses. Pt has not spoken with her Neurologist about her upcoming gastric surgery. Her MS has been stable as she continues Ocrevus. Pt is having significant fatigue.  Lab results today (03/25/20) of CBC w/diff and CMP is as follows: all values are WNL. 03/25/2020 Von Willebrand Panel is in progress 03/25/2020 Ferritin at 60  On review of  systems, pt reports fatigue and denies nose bleeds, gum bleeds, abnormal bruising, hematuria, bloody/black stools, heavy menstrual losses, fevers, chills, night sweats and any other symptoms.   MEDICAL HISTORY:  Past Medical History:  Diagnosis Date  . ADD (attention deficit disorder)    takes Adderall daily  . Anemia   . Anxiety    takes Xanax daily as needed  . Asthma    mild case per pt  . Chronic lower back pain   . Depression    has meds prescribed but doesn't take them  . Family history of adverse reaction to anesthesia    "Mom gets PONV too"  . Headache    "monthly" (02/27/2015)  . Hemorrhoids   . History of esophagogastroduodenoscopy (EGD)   . Migraine    "none in the last 6 months" (02/27/2015)  . Nocturia   . Pneumonia "several times"  . PONV (postoperative nausea and vomiting)   . Von Willebrand disease (HCC)    Dr. Cyndie Chime    SURGICAL HISTORY: Past Surgical History:  Procedure Laterality Date  . BACK SURGERY    . BREAST CYST EXCISION Left 02/20/2018   Procedure: EXCISION OF LEFT BREAST SEBACEOUS CYST;  Surgeon: Emelia Loron, MD;  Location: [facility];  Service: General;  Laterality: Left;  . BREAST REDUCTION SURGERY Bilateral 02/27/2015   Procedure: BILATERAL BREAST REDUCTION WITH FREE NIPPLE GRAFT TECHNIQUE ON RIGHT BREAST;  Surgeon: Etter Sjogren, MD;  Location: [facility] OR;  Service: Plastics;  Laterality: Bilateral;  . CESAREAN SECTION  2004; 2013  . CHOLECYSTECTOMY  03/02/2012   Procedure: LAPAROSCOPIC CHOLECYSTECTOMY WITH INTRAOPERATIVE CHOLANGIOGRAM;  Surgeon: Currie Paris, MD;  Location: [facility] OR;  Service: General;  Laterality: N/A;  . DILATION AND CURETTAGE OF UTERUS  ~ 1997; ~ 2005  . DILITATION & CURRETTAGE/HYSTROSCOPY WITH THERMACHOICE ABLATION  04/28/2012   Procedure: DILATATION & CURETTAGE/HYSTEROSCOPY WITH THERMACHOICE ABLATION;  Surgeon: Jeani Hawking, MD;  Location: [facility]S;  Service: Gynecology;  Laterality: N/A;  .  ERCP  03/03/2012   Procedure: ENDOSCOPIC RETROGRADE CHOLANGIOPANCREATOGRAPHY (ERCP);  Surgeon: Theda Belfast, MD;  Location: [facility] END[facility]COPY;  Service: Endoscopy;  Laterality: N/A;  dl/hung  . LAPAROSCOPY  2011  . LUMBAR DISC SURGERY  2003; 2008; 2009  . MULTIPLE TOOTH EXTRACTIONS  "scattered dates"  . POSTERIOR LUMBAR FUSION  2010  . REDUCTION MAMMAPLASTY Bilateral 02/27/2015  . TUBAL LIGATION  2013  . WISDOM TOOTH EXTRACTION  2001    SOCIAL HISTORY: Social History   Socioeconomic History  . Marital status: Married    Spouse name: Not on file  . Number of children: Not on file  . Years of education: Not on file  . Highest education level: Not on file  Occupational History  . Occupation: regulatory affairs  Tobacco Use  . Smoking status: Former Smoker    Packs/day: 0.25    Years: 2.00    Pack years: 0.50    Types: Cigarettes    Quit date: 2000    Years since quitting: 21.8  . Smokeless tobacco: Never Used  Substance and Sexual Activity  . Alcohol use: Yes    Alcohol/week: 0.0 standard  drinks    Comment: social  . Drug use: Yes    Types: Marijuana    Comment: occasional use, last use last week  . Sexual activity: Yes    Birth control/protection: None    Comment: BTL  Other Topics Concern  . Not on file  Social History Narrative   Lives with husband and kids   Caffeine use:    16 oz per day   Right handed   Social Determinants of Health   Financial Resource Strain:   . Difficulty of Paying Living Expenses: Not on file  Food Insecurity:   . Worried About Programme researcher, broadcasting/film/video in the Last Year: Not on file  . Ran Out of Food in the Last Year: Not on file  Transportation Needs:   . Lack of Transportation (Medical): Not on file  . Lack of Transportation (Non-Medical): Not on file  Physical Activity:   . Days of Exercise per Week: Not on file  . Minutes of Exercise per Session: Not on file  Stress:   . Feeling of Stress : Not on file  Social Connections:   .  Frequency of Communication with Friends and Family: Not on file  . Frequency of Social Gatherings with Friends and Family: Not on file  . Attends Religious Services: Not on file  . Active Member of Clubs or Organizations: Not on file  . Attends Banker Meetings: Not on file  . Marital Status: Not on file  Intimate Partner Violence:   . Fear of Current or Ex-Partner: Not on file  . Emotionally Abused: Not on file  . Physically Abused: Not on file  . Sexually Abused: Not on file    FAMILY HISTORY: Family History  Problem Relation Age of Onset  . Stroke Mother   . Arthritis Mother   . Depression Mother   . COPD Father   . Kidney disease Paternal Uncle   . Stroke Maternal Grandfather   . Heart disease Paternal Grandfather   . Multiple sclerosis Neg Hx     ALLERGIES:  is allergic to compazine [prochlorperazine edisylate], darvocet [propoxyphene n-acetaminophen], codeine, and sulfa antibiotics.  MEDICATIONS:  Current Outpatient Medications  Medication Sig Dispense Refill  . ALPRAZolam (XANAX) 1 MG tablet Take 0.5 mg by mouth 2 (two) times daily as needed for anxiety.     . Armodafinil 200 MG TABS One po qAM 30 tablet 3  . baclofen (LIORESAL) 10 MG tablet 1/2-1 tablet by mouth three times daily 90 tablet 5  . BIOTIN PO Take 1 Dose by mouth daily.    . Cyanocobalamin (B-12 PO) Take 1 Dose by mouth daily.    Marland Kitchen FLUoxetine (PROZAC) 40 MG capsule Take 40 mg by mouth at bedtime.    . hydrOXYzine (ATARAX/VISTARIL) 10 MG tablet Take 1 tablet (10 mg total) by mouth 3 (three) times daily as needed. 90 tablet 5  . meloxicam (MOBIC) 7.5 MG tablet Take 1 tablet (7.5 mg total) by mouth daily. 30 tablet 5  . phentermine (ADIPEX-P) 37.5 MG tablet Take 1 tablet (37.5 mg total) by mouth daily before breakfast. 30 tablet 5  . Vitamin D, Ergocalciferol, (DRISDOL) 1.25 MG (50000 UT) CAPS capsule Take 50,000 Units by mouth every 7 (seven) days.     No current facility-administered  medications for this visit.    REVIEW OF SYSTEMS:   A 10+ POINT REVIEW OF SYSTEMS WAS OBTAINED including neurology, dermatology, psychiatry, cardiac, respiratory, lymph, extremities, GI, GU, Musculoskeletal, constitutional, breasts, reproductive,  HEENT.  All pertinent positives are noted in the HPI.  All others are negative.   PHYSICAL EXAMINATION: ECOG PERFORMANCE STATUS: 1 - Symptomatic but completely ambulatory  . Vitals:   03/25/20 1150  BP: 139/79  Pulse: 91  Resp: 18  Temp: (!) 97.2 F (36.2 C)  SpO2: 99%   Filed Weights   03/25/20 1150  Weight: 257 lb (116.6 kg)   .Body mass index is 45.53 kg/m.  GENERAL:alert, in no acute distress and comfortable SKIN: no acute rashes, no significant lesions EYES: conjunctiva are pink and non-injected, sclera anicteric OROPHARYNX: MMM, no exudates, no oropharyngeal erythema or ulceration NECK: supple, no JVD LYMPH:  no palpable lymphadenopathy in the cervical, axillary or inguinal regions LUNGS: clear to auscultation b/l with normal respiratory effort HEART: regular rate & rhythm ABDOMEN:  normoactive bowel sounds , non tender, not distended. No palpable hepatosplenomegaly.  Extremity: no pedal edema PSYCH: alert & oriented x 3 with fluent speech NEURO: no focal motor/sensory deficits  LABORATORY DATA:  I have reviewed the data as listed  . CBC Latest Ref Rng & Units 03/25/2020 01/16/2019 01/01/2019  WBC 4.0 - 10.5 K/uL 10.0 7.9 9.0  Hemoglobin 12.0 - 15.0 g/dL 26.3 33.5 45.6  Hematocrit 36 - 46 % 38.8 38.5 37.0  Platelets 150 - 400 K/uL 351 319 250    . CMP Latest Ref Rng & Units 03/25/2020 01/16/2019 01/01/2019  Glucose 70 - 99 mg/dL 98 92 88  BUN 6 - 20 mg/dL 8 8 7   Creatinine 0.44 - 1.00 mg/dL 2.56 3.89 3.73  Sodium 135 - 145 mmol/L 140 140 150(H)  Potassium 3.5 - 5.1 mmol/L 4.1 4.0 4.5  Chloride 98 - 111 mmol/L 106 106 109(H)  CO2 22 - 32 mmol/L 26 26 18(L)  Calcium 8.9 - 10.3 mg/dL 9.3 9.5 42.8  Total Protein 6.5 -  8.1 g/dL 7.7 7.6 7.6  Total Bilirubin 0.3 - 1.2 mg/dL 0.5 0.4 <7.6  Alkaline Phos 38 - 126 U/L 78 59 75  AST 15 - 41 U/L 15 15 17   ALT 0 - 44 U/L 12 14 19      RADIOGRAPHIC STUDIES: I have personally reviewed the radiological images as listed and agreed with the findings in the report. No results found.  ASSESSMENT & PLAN:    39 yo with h/o VWD type 1 and recently diagnosed MS  1) Von Willebrand disease  01/16/2019 Coag Studies Interp Report shows "The VWF:Ag is normal. The VWF:RCo is decreased. The FVIII is normal."  PLAN: -Discussed pt labwork today, 03/25/20; blood counts and chemistries are nml, Ferritin is WNL, Von Willebrand panel is in progress. -Advised pt that for abdominal surgery we would prefer Von Willebrand levels closer to 100 for 48hours and then 50% for 1 week. -she will likely need to be seen by a hematology at [facility] if having surgery there so they can direct ddavp and factor replacement. -Continue to avoid OTC pain medications, NSAIDS, and decrease alcohol use.  -Will see back in 12 months with labs    FOLLOW UP: RTC with Dr Candise Che with labs in 12 months   The total time spent in the appt was 20 minutes and more than 50% was on counseling and direct patient cares.  All of the patient's questions were answered with apparent satisfaction. The patient knows to call the clinic with any problems, questions or concerns.    Wyvonnia Lora MD MS AAHIVMS [facility] Hematology/Oncology Physician [facility]  (Office):  [phone] (Work cell):  [phone] (Fax):           [phone]  03/25/2020 1:32 PM  I, Carollee Herter, am acting as a scribe for Dr. Wyvonnia Lora.   .I have reviewed the above documentation for accuracy and completeness, and I agree with the above. Johney Maine MD

## 2020-03-26 LAB — VON WILLEBRAND PANEL
Coagulation Factor VIII: 81 % (ref 56–140)
Ristocetin Co-factor, Plasma: 31 % — ABNORMAL LOW (ref 50–200)
Von Willebrand Antigen, Plasma: 52 % (ref 50–200)

## 2020-03-26 LAB — COAG STUDIES INTERP REPORT

## 2020-05-21 ENCOUNTER — Other Ambulatory Visit: Payer: Self-pay | Admitting: Surgical Oncology

## 2020-05-21 DIAGNOSIS — K449 Diaphragmatic hernia without obstruction or gangrene: Secondary | ICD-10-CM

## 2020-06-03 ENCOUNTER — Ambulatory Visit
Admission: RE | Admit: 2020-06-03 | Discharge: 2020-06-03 | Disposition: A | Discharge status: Home / Self Care | Payer: 59 | Source: Ambulatory Visit | Attending: Surgical Oncology | Admitting: Surgical Oncology

## 2020-06-03 ENCOUNTER — Other Ambulatory Visit: Payer: Self-pay | Admitting: Surgical Oncology

## 2020-06-03 DIAGNOSIS — K449 Diaphragmatic hernia without obstruction or gangrene: Secondary | ICD-10-CM

## 2020-07-13 ENCOUNTER — Other Ambulatory Visit: Payer: Self-pay | Admitting: Neurology

## 2020-07-15 ENCOUNTER — Telehealth: Payer: Self-pay | Admitting: *Deleted

## 2020-07-15 NOTE — Telephone Encounter (Signed)
Request Reference Number: [id number]. PHENTERMINE TAB 37.5MG  is approved through 01/15/2021. Your patient may now fill this prescription and it will be covered.

## 2020-07-15 NOTE — Telephone Encounter (Signed)
Submitted PA phentermine on CMM. Key: [id number]. Waiting on determination from [facility].

## 2020-07-24 ENCOUNTER — Other Ambulatory Visit: Payer: Self-pay | Admitting: Neurology

## 2020-07-24 DIAGNOSIS — R200 Anesthesia of skin: Secondary | ICD-10-CM

## 2020-07-24 MED ORDER — MODAFINIL 200 MG PO TABS: 200.0000 mg | ORAL_TABLET | Freq: Every day | ORAL | 5 refills | Status: DC

## 2020-07-28 ENCOUNTER — Telehealth: Payer: Self-pay

## 2020-07-28 NOTE — Telephone Encounter (Signed)
Received notice that patient will be transferring out of the CHIMES study.  Her last infusion was March 17th, 2022.  She will be due for her Ocrevus again on January 24, 2021.  I have given Ocrevus order and start form for Dr. Epimenio Foot to sign.

## 2020-07-28 NOTE — Telephone Encounter (Signed)
Received PA request for modafinil from [facility].  Attempted via CMM.  CMM could not find patient.  I called [facility].  I completed PA for modafinil via phone.  PA was approved 07/28/2020 -07/28/2021.  Case ID: [id number].  [facility] notified.

## 2020-07-28 NOTE — Telephone Encounter (Signed)
Order and start form signed by Dr. Epimenio Foot.  Start form faxed to [place].  Received a receipt of confirmation.  Start form, order, demographics, insurance, labs, office visit, and imaging have been given to [employer] for scheduling.

## 2020-07-29 ENCOUNTER — Ambulatory Visit (INDEPENDENT_AMBULATORY_CARE_PROVIDER_SITE_OTHER): Payer: 59 | Admitting: Neurology

## 2020-07-29 ENCOUNTER — Other Ambulatory Visit: Payer: Self-pay

## 2020-07-29 ENCOUNTER — Encounter (INDEPENDENT_AMBULATORY_CARE_PROVIDER_SITE_OTHER): Payer: 59 | Admitting: Neurology

## 2020-07-29 DIAGNOSIS — Z0289 Encounter for other administrative examinations: Secondary | ICD-10-CM

## 2020-07-29 DIAGNOSIS — R200 Anesthesia of skin: Secondary | ICD-10-CM

## 2020-07-29 NOTE — Progress Notes (Signed)
Full Name: Ann [name] Gender: Female MRN #: [id number] Date of Birth: 03-26-1981    Visit Date: 07/29/2020 07:23 Age: 40 Years Examining Physician: Despina Arias, MD  Referring Physician: Despina Arias, MD  History: Ms. Droz is a 40 year old woman with multiple sclerosis.  Has pain and numbness in the hands, right greater than left.  The uncomfortable sensations extend from the right wrist into the hand, the first 3 fingers more than the fifth.  Episodes occur more at night and awaken her from sleep.  He is after 5 to 10 minutes the pain improves.  If the pain and tingling is more intense sensation will radiate a few inches into the forearm.  She has similar but much milder symptoms on the left.  Nerve conduction studies:  Bilateral median motor responses and the left ulnar motor response had normal distal latencies, amplitudes and conduction velocities.  F-wave latencies were normal.  The right median sensory response was slowed across the wrist.  The left median sensory response had a normal peak latency when tested with orthodromic stimulation and borderline latency with antidromic testing.  The left ulnar sensory response was normal.  Electromyography: Needle EMG of selected muscles of the right arm was performed.  Motor unit morphology and recruitment was normal in all of the muscles tested including a median innervated hand muscle.  There was no abnormal spontaneous activity.  Impression: This NCV/EMG study shows the following: 1.   Delayed right median sensory response across the wrist with preserved median motor response had normal EMG.  This is consistent with a mild median neuropathy across the wrist (carpal tunnel syndrome) 2.   Borderline normal median responses across the left wrist.  Richard A. Epimenio Foot, MD, PhD, FAAN Certified in Neurology, Clinical Neurophysiology, Sleep Medicine, Pain Medicine and Neuroimaging Director, Multiple Sclerosis Center at  [facility]  [facility] [address], [address] [place], [place] [address] [phone]   Clinical note: The patient was advised to obtain and wear a wrist plan for carpal tunnel syndrome at night on the right arm.  If symptoms do not improve, we could consider referral to orthopedics. ---RAS  Verbal informed consent was obtained from the patient, patient was informed of potential risk of procedure, including bruising, bleeding, hematoma formation, infection, muscle weakness, muscle pain, numbness, among others.        MNC    Nerve / Sites Muscle Latency Ref. Amplitude Ref. Rel Amp Segments Distance Velocity Ref. Area    ms ms mV mV %  cm m/s m/s mVms  L Median - APB     Wrist APB 3.9 ?4.4 9.3 ?4.0 100 Wrist - APB 7   34.6     Upper arm APB 7.5  8.7  93.3 Upper arm - Wrist 20 55 ?49 32.7  R Median - APB     Wrist APB 4.1 ?4.4 9.5 ?4.0 100 Wrist - APB 7   32.2     Upper arm APB 8.1  9.2  96.5 Upper arm - Wrist 21 53 ?49 32.4  L Ulnar - ADM     Wrist ADM 2.1 ?3.3 13.6 ?6.0 100 Wrist - ADM 7   30.8     B.Elbow ADM 5.2  12.5  92.2 B.Elbow - Wrist 18 59 ?49 30.3     A.Elbow ADM 7.1  11.7  93.3 A.Elbow - B.Elbow 10 53 ?49 29.8           SNC  Nerve / Sites Rec. Site Peak Lat Ref.  Amp Ref. Segments Distance Peak Diff Ref.    ms ms V V  cm ms ms  L Median, Ulnar - Transcarpal comparison     Median Palm Wrist 2.2 ?2.2 51 ?35 Median Palm - Wrist 8       Ulnar Palm Wrist 1.9 ?2.2 15 ?12 Ulnar Palm - Wrist 8          Median Palm - Ulnar Palm  0.3 ?0.4  R Median, Ulnar - Transcarpal comparison     Median Palm Wrist 2.9 ?2.2 30 ?35 Median Palm - Wrist 8       Ulnar Palm Wrist 1.7 ?2.2 23 ?12 Ulnar Palm - Wrist 8          Median Palm - Ulnar Palm  1.1 ?0.4  L Median - Orthodromic (Dig II, Mid palm)     Dig II Wrist 3.1 ?3.4 14 ?10 Dig II - Wrist 13    R Median - Orthodromic (Dig II, Mid palm)     Dig II Wrist 3.9 ?3.4 11 ?10 Dig II - Wrist 13     L Ulnar - Orthodromic, (Dig V, Mid palm)     Dig V Wrist 2.5 ?3.1 6 ?5 Dig V - Wrist 61                 F  Wave    Nerve F Lat Ref.   ms ms  L Ulnar - ADM 28.7 ?32.0       EMG Summary Table    Spontaneous MUAP Recruitment  Muscle IA Fib PSW Fasc Other Amp Dur. Poly Pattern  R. Deltoid Normal None None None _______ Normal Normal Normal Normal  R. Triceps brachii Normal None None None _______ Normal Normal Normal Normal  R. Biceps brachii Normal None None None _______ Normal Normal Normal Normal  R. Extensor digitorum communis Normal None None None _______ Normal Normal Normal Normal  R. Abductor pollicis brevis Normal None None None _______ Normal Normal Normal Normal  R. First dorsal interosseous Normal None None None _______ Normal Normal Normal Normal

## 2020-09-29 NOTE — Telephone Encounter (Signed)
Checking w/ intrafusion to get update on this. Waiting on response

## 2020-10-15 ENCOUNTER — Other Ambulatory Visit: Payer: Self-pay | Admitting: Neurology

## 2020-10-15 DIAGNOSIS — G35 Multiple sclerosis: Secondary | ICD-10-CM

## 2020-11-12 ENCOUNTER — Other Ambulatory Visit: Payer: Self-pay | Admitting: Obstetrics and Gynecology

## 2020-11-12 DIAGNOSIS — R928 Other abnormal and inconclusive findings on diagnostic imaging of breast: Secondary | ICD-10-CM

## 2020-12-03 ENCOUNTER — Other Ambulatory Visit: Payer: Self-pay | Admitting: Obstetrics and Gynecology

## 2020-12-03 ENCOUNTER — Ambulatory Visit
Admission: RE | Admit: 2020-12-03 | Discharge: 2020-12-03 | Disposition: A | Discharge status: Home / Self Care | Payer: 59 | Source: Ambulatory Visit | Attending: Obstetrics and Gynecology | Admitting: Obstetrics and Gynecology

## 2020-12-03 ENCOUNTER — Other Ambulatory Visit: Payer: Self-pay

## 2020-12-03 DIAGNOSIS — R928 Other abnormal and inconclusive findings on diagnostic imaging of breast: Secondary | ICD-10-CM

## 2020-12-03 DIAGNOSIS — N6489 Other specified disorders of breast: Secondary | ICD-10-CM

## 2021-01-08 ENCOUNTER — Other Ambulatory Visit: Payer: Self-pay

## 2021-01-08 ENCOUNTER — Ambulatory Visit
Admission: RE | Admit: 2021-01-08 | Discharge: 2021-01-08 | Disposition: A | Discharge status: Home / Self Care | Payer: No Typology Code available for payment source | Source: Ambulatory Visit | Attending: Neurology | Admitting: Neurology

## 2021-01-08 DIAGNOSIS — G35 Multiple sclerosis: Secondary | ICD-10-CM

## 2021-01-08 MED ORDER — GADOTERIDOL 279.3 MG/ML IV SOLN
20.0000 mL | Freq: Once | INTRAVENOUS | Status: AC | PRN
  Administered 2021-01-08: 20 mL via INTRAVENOUS

## 2021-01-19 ENCOUNTER — Telehealth (INDEPENDENT_AMBULATORY_CARE_PROVIDER_SITE_OTHER): Payer: Self-pay | Admitting: Neurology

## 2021-01-19 DIAGNOSIS — G35 Multiple sclerosis: Secondary | ICD-10-CM

## 2021-01-19 NOTE — Telephone Encounter (Signed)
The patient was here today for the week 96 visit for the chimes study (Ocrevus).  She feels her MS is very stable.  She denies any side effects of consequence.  Of note, she did have bariatric surgery July 2022  On examination today, the EDSS was 2.0: Visual 2.0 (20/40 OD and 20/20 OS) Cranial nerves 0 Motor 0 Coordination 1 (tandem gait) Sensory 0 Bladder/bowel 0 Cerebral 1 (occasional mild depression and occasional mild cognitive issues not interfering with normal activity).  She also has mild fatigue rarely interfering with activities. Ambulation 1 (fairly unlimited but was better 5 years ago)  She will have her infusion of Ocrevus later this month.

## 2021-03-23 ENCOUNTER — Other Ambulatory Visit: Payer: Self-pay

## 2021-03-23 DIAGNOSIS — D6800 Von Willebrand disease, unspecified: Secondary | ICD-10-CM

## 2021-03-24 ENCOUNTER — Ambulatory Visit: Payer: 59 | Admitting: Hematology

## 2021-03-24 ENCOUNTER — Other Ambulatory Visit: Payer: 59

## 2021-03-28 IMAGING — MR MRI HEAD WITH CONTRAST
4 of 6 series · 20 of 48 positions shown · IV contrast (agent unspecified)
Comparison: MRI brain without contrast or earlier today

CLINICAL DATA: Multiple sclerosis.  New neurologic event.

EXAM:
MRI HEAD WITH CONTRAST
TECHNIQUE: Multiplanar, multiecho pulse sequences of the brain and surrounding
structures were obtained with intravenous contrast.
CONTRAST:  8 mL Gadovist IV

[Series 11: t2_space_dark-fluid_sag_p2_ns-ir · sagittal · 1.0mm · 0.49mm/px · 9 of 160 slices shown]
[im 1/160]
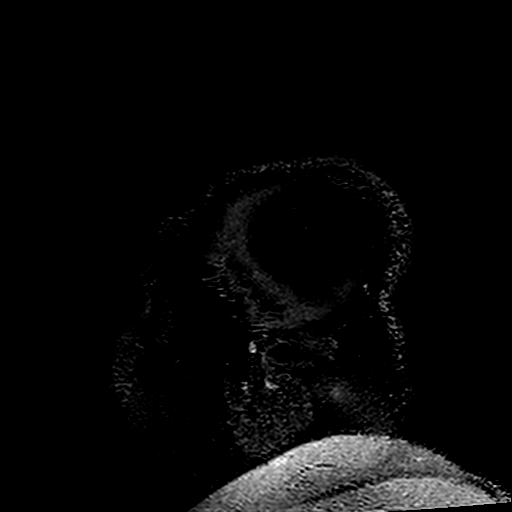
[im 27/160]
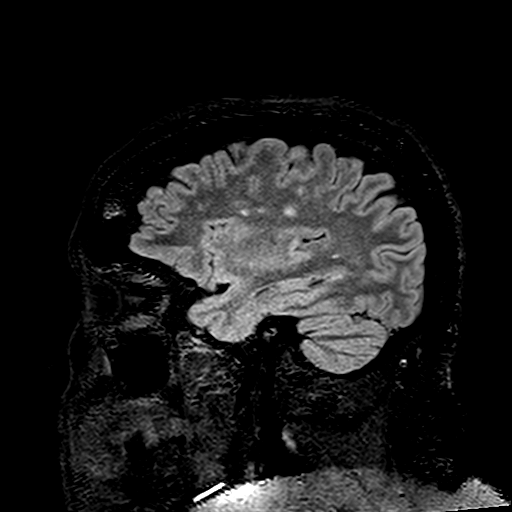
[im 54/160]
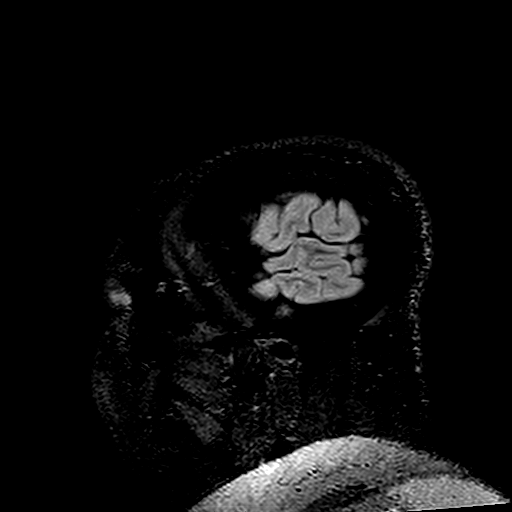
[im 67/160]
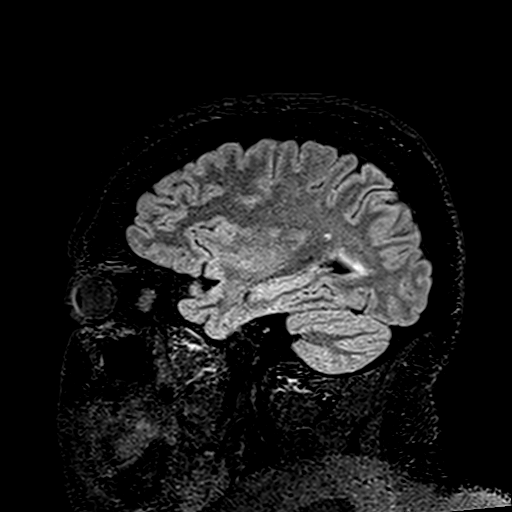
[im 80/160]
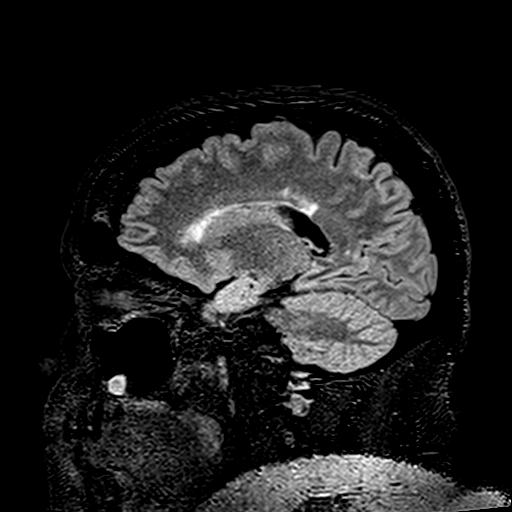
[im 93/160]
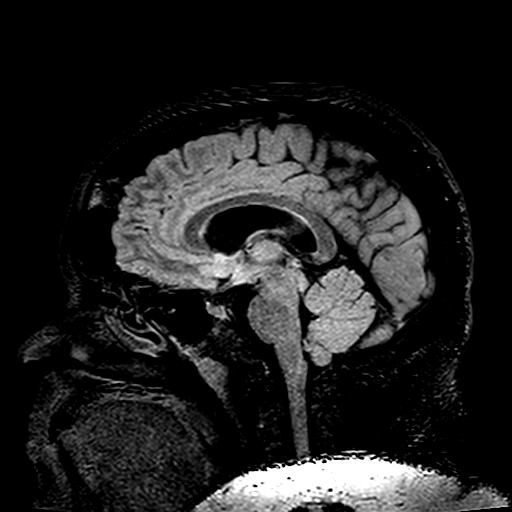
[im 107/160]
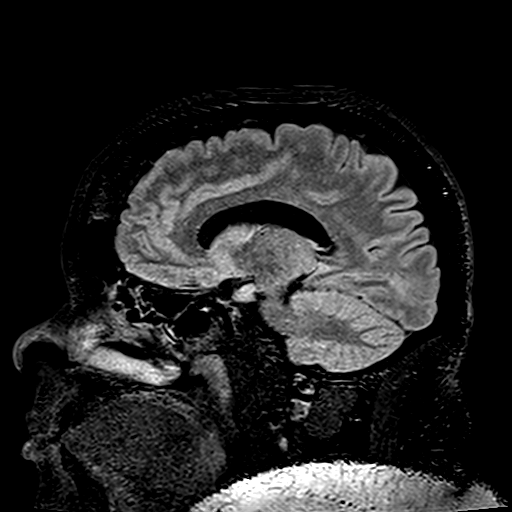
[im 133/160]
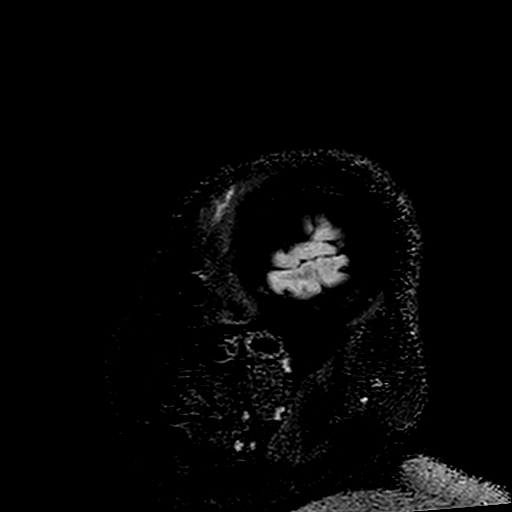
[im 160/160]
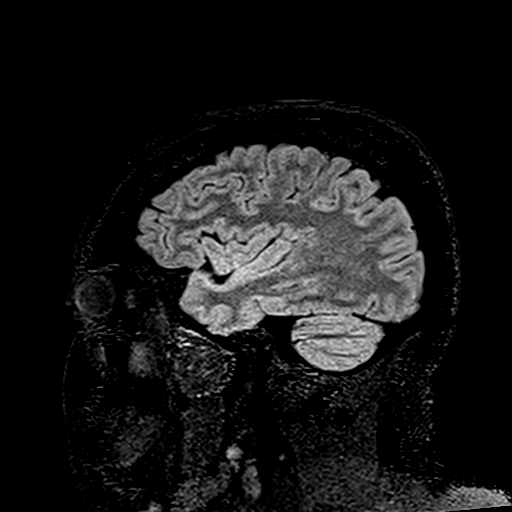

[Series 12: t2_space_dark-fluid_sag_p2_ns-ir_mpr_ axial · axial · 1.0mm · 0.45mm/px · z∈[-105,+27]mm · 7 of 160 slices shown]
[im 1/160]
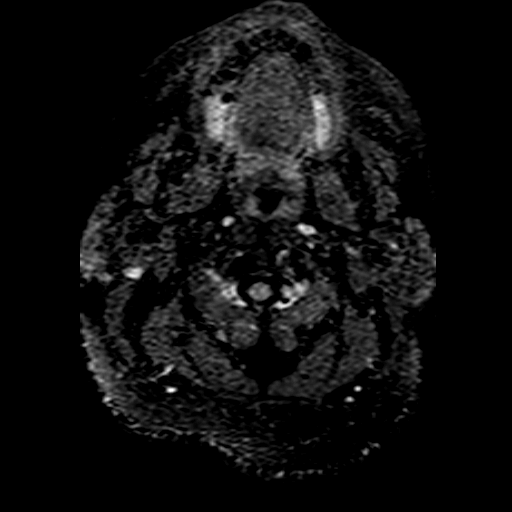
[im 27/160]
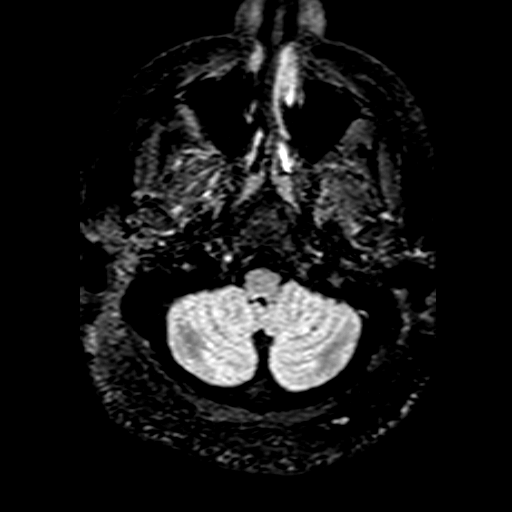
[im 54/160]
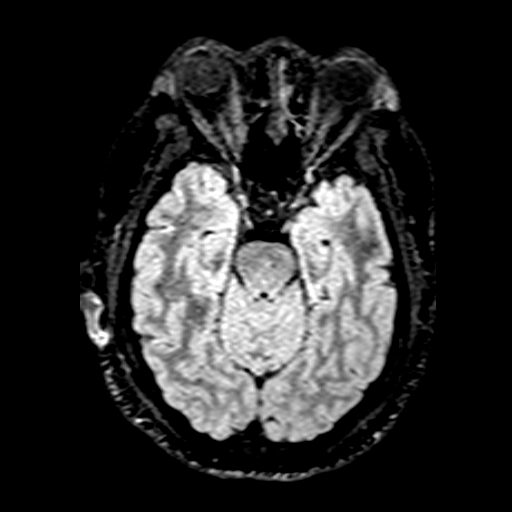
[im 67/160]
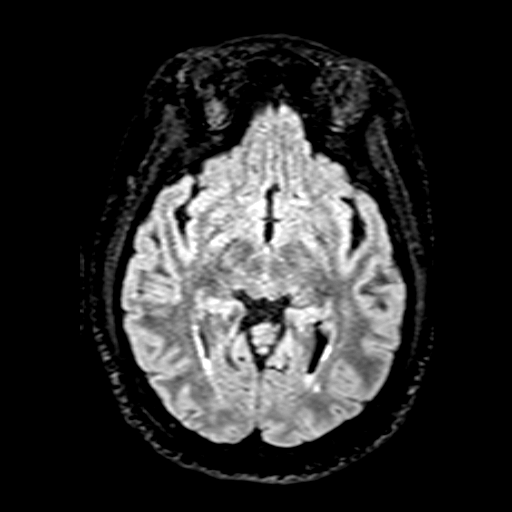
[im 80/160]
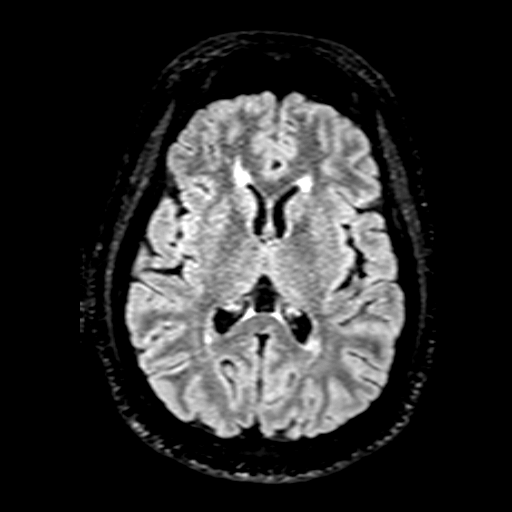
[im 93/160]
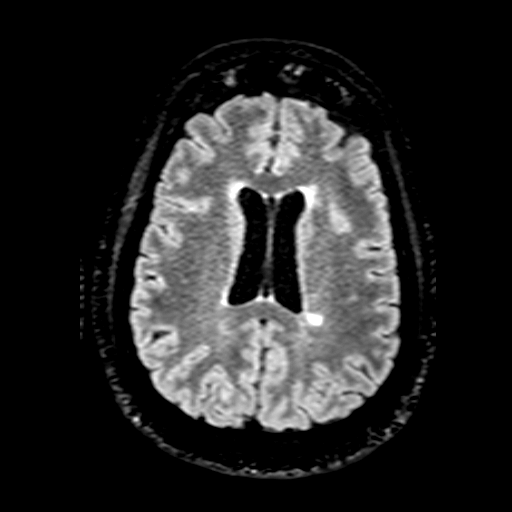
[im 133/160]
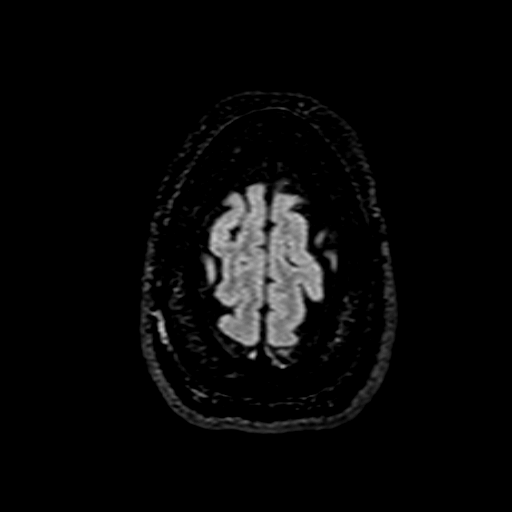

[Series 17: T1 post-contrast · coronal · 5.0mm · 0.34mm/px · 2 of 29 slices shown (1 of 2)]
[im 1/29]
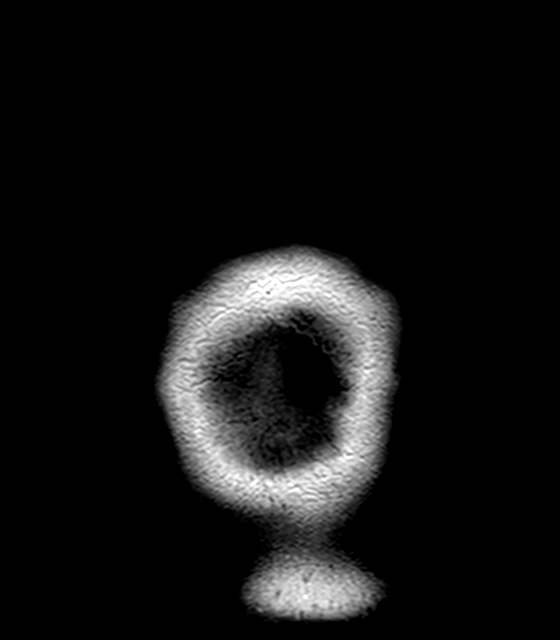
[im 29/29]
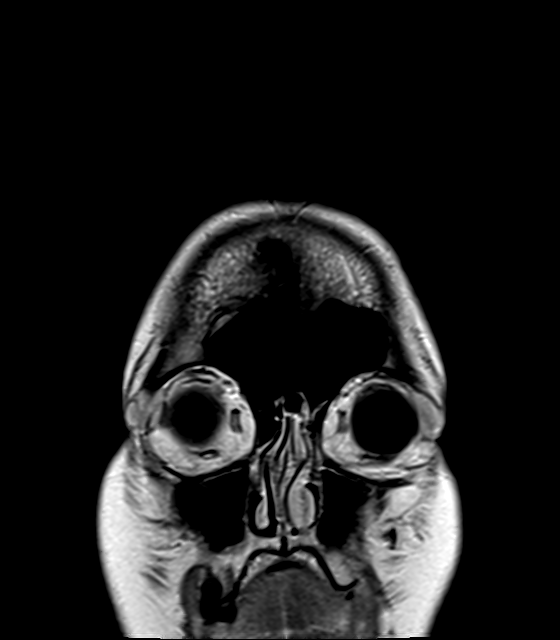

[Series 18: T1 post-contrast · sagittal · 4.0mm · 0.72mm/px · 2 of 31 slices shown (2 of 2)]
[im 1/31]
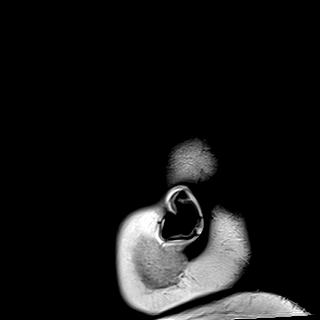
[im 31/31]
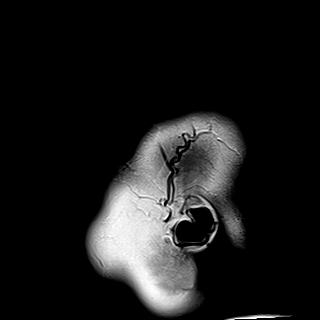

[20 of 48 positions shown; findings below may reference images not displayed]

FINDINGS: White matter lesion in the corona radiata on the left shows mild
patchy enhancement. This area showed increased signal on
diffusion-weighted imaging. Additional lesions in the
periventricular white matter bilaterally are strongly suspicious for
multiple sclerosis. No lesions in the posterior fossa

Ventricle size normal.  No other enhancing lesions.
IMPRESSION: Periventricular white matter lesions are present bilaterally, highly
suspicious for multiple sclerosis. Patchy enhancement in a lesion in
the left corona radiata most consistent with active demyelinization.

## 2021-04-27 ENCOUNTER — Other Ambulatory Visit: Payer: Self-pay | Admitting: Neurology

## 2021-04-27 ENCOUNTER — Ambulatory Visit (HOSPITAL_COMMUNITY): Admission: RE | Admit: 2021-04-27 | Payer: 59 | Source: Home / Self Care | Admitting: Obstetrics and Gynecology

## 2021-04-27 ENCOUNTER — Encounter: Payer: Self-pay | Admitting: Neurology

## 2021-04-27 ENCOUNTER — Encounter (HOSPITAL_COMMUNITY): Admission: RE | Payer: Self-pay | Source: Home / Self Care

## 2021-04-27 DIAGNOSIS — N920 Excessive and frequent menstruation with regular cycle: Secondary | ICD-10-CM

## 2021-04-27 SURGERY — HYSTERECTOMY, TOTAL, ABDOMINAL, WITH SALPINGECTOMY
Anesthesia: General | Laterality: Bilateral

## 2021-04-27 MED ORDER — BACLOFEN 10 MG PO TABS: 5.0000 mg | ORAL_TABLET | Freq: Three times a day (TID) | ORAL | 0 refills | Status: DC

## 2021-04-27 MED ORDER — PHENTERMINE HCL 37.5 MG PO TABS: ORAL_TABLET | ORAL | 0 refills | Status: DC

## 2021-04-28 ENCOUNTER — Telehealth: Payer: Self-pay

## 2021-04-28 NOTE — Telephone Encounter (Signed)
Renewal PA for Phentermine has been completed and received instant approval.   (Key: [id number])  This request has received a Favorable outcome.  Please note any additional information provided by [facility] at the bottom of your screen. Pharmacy has been notified.

## 2021-06-22 ENCOUNTER — Other Ambulatory Visit: Payer: Self-pay

## 2021-06-22 ENCOUNTER — Ambulatory Visit
Admission: RE | Admit: 2021-06-22 | Discharge: 2021-06-22 | Disposition: A | Discharge status: Home / Self Care | Payer: 59 | Source: Ambulatory Visit | Attending: Obstetrics and Gynecology | Admitting: Obstetrics and Gynecology

## 2021-06-22 DIAGNOSIS — N6489 Other specified disorders of breast: Secondary | ICD-10-CM

## 2021-06-30 ENCOUNTER — Telehealth: Payer: Self-pay | Admitting: Neurology

## 2021-06-30 MED ORDER — TIZANIDINE HCL 4 MG PO TABS: 4.0000 mg | ORAL_TABLET | Freq: Three times a day (TID) | ORAL | 0 refills | Status: DC | PRN

## 2021-06-30 MED ORDER — AMPHETAMINE-DEXTROAMPHETAMINE 10 MG PO TABS: ORAL_TABLET | ORAL | 0 refills | Status: DC

## 2021-06-30 NOTE — Telephone Encounter (Signed)
[name] was seen today for a research visit for the chimes multiple sclerosis study.  She feels her MS has been stable.  She has tolerated the study medication well.  She has no new neurologic symptoms.  She continues to experience fatigue as her main symptom.  EDSS today was 2.0 (vision to, cerebella 1, cognitive 1, bladder 1, ambulation index 1, all others 0)

## 2021-07-16 ENCOUNTER — Other Ambulatory Visit: Payer: Self-pay | Admitting: Neurology

## 2021-07-16 ENCOUNTER — Encounter: Payer: Self-pay | Admitting: Neurology

## 2021-07-16 MED ORDER — METHYLPHENIDATE HCL 10 MG PO TABS: 10.0000 mg | ORAL_TABLET | Freq: Two times a day (BID) | ORAL | 0 refills | Status: DC

## 2021-08-31 ENCOUNTER — Other Ambulatory Visit: Payer: Self-pay | Admitting: Neurology

## 2021-08-31 DIAGNOSIS — G35 Multiple sclerosis: Secondary | ICD-10-CM

## 2021-09-25 ENCOUNTER — Other Ambulatory Visit: Payer: 59

## 2021-09-28 ENCOUNTER — Other Ambulatory Visit: Payer: Self-pay | Admitting: Neurology

## 2021-09-29 MED ORDER — TIZANIDINE HCL 4 MG PO TABS: 4.0000 mg | ORAL_TABLET | Freq: Three times a day (TID) | ORAL | 0 refills | Status: DC | PRN

## 2021-10-13 ENCOUNTER — Other Ambulatory Visit: Payer: Self-pay | Admitting: Neurology

## 2021-10-13 DIAGNOSIS — G35 Multiple sclerosis: Secondary | ICD-10-CM

## 2021-11-21 ENCOUNTER — Other Ambulatory Visit: Payer: Self-pay | Admitting: Neurology

## 2021-12-03 ENCOUNTER — Ambulatory Visit
Admission: RE | Admit: 2021-12-03 | Discharge: 2021-12-03 | Disposition: A | Discharge status: Home / Self Care | Payer: No Typology Code available for payment source | Source: Ambulatory Visit | Attending: Neurology | Admitting: Neurology

## 2021-12-03 DIAGNOSIS — G35 Multiple sclerosis: Secondary | ICD-10-CM

## 2021-12-03 DIAGNOSIS — Z0289 Encounter for other administrative examinations: Secondary | ICD-10-CM

## 2021-12-03 MED ORDER — GADOTERIDOL 279.3 MG/ML IV SOLN
19.0000 mL | Freq: Once | INTRAVENOUS | Status: AC | PRN
  Administered 2021-12-03: 19 mL via INTRAVENOUS

## 2021-12-17 ENCOUNTER — Other Ambulatory Visit: Payer: Self-pay | Admitting: Neurology

## 2021-12-17 MED ORDER — TIZANIDINE HCL 4 MG PO TABS: 4.0000 mg | ORAL_TABLET | Freq: Three times a day (TID) | ORAL | 1 refills | Status: DC | PRN

## 2021-12-18 ENCOUNTER — Telehealth: Payer: Self-pay | Admitting: Neurology

## 2021-12-18 NOTE — Telephone Encounter (Signed)
IV access was difficult due to small veins.   Once the IV was established, her infusion went well and she tolerated the Ocrevus well.  We had a conversation about various options.  She could continue on Ocrevus with peripheral IV access knowing that multiple attempts at IV may be necessary.  I again reminded her about the voluntary nature of participating in a research study.  Alternatively, we could refer her for a port and I went over the risks and benefits of doing so.  We also discussed that there are other medications that could treat her MS including Kesimpta which is a once a month shot with the same mechanism of action as Ocrevus and sharing similar risks and benefits.  I also discussed that there are some oral agents.  For the time being she would like to continue on Ocrevus every 6 months.

## 2021-12-18 NOTE — Telephone Encounter (Signed)
Ann [name] was seen today for a research visit for the chimes multiple sclerosis study.   She feels her MS has been stable.  She has tolerated the study medication well.  She has no new neurologic symptoms.  She reports that her fatigue is generally at a higher level the last month of age Ocrevus cycle.  Even when most tired, she is able to easily walk a 1/3 mile though at a slower pace than her friends/family   EDSS today was 2.0 (vision 2, cerebellar 1, cerebral 1, ambulation index 1, all others 0)

## 2021-12-18 NOTE — Telephone Encounter (Signed)
Duplicate note created in error.

## 2022-01-04 ENCOUNTER — Other Ambulatory Visit: Payer: Self-pay | Admitting: Obstetrics and Gynecology

## 2022-01-04 DIAGNOSIS — R928 Other abnormal and inconclusive findings on diagnostic imaging of breast: Secondary | ICD-10-CM

## 2022-01-18 ENCOUNTER — Ambulatory Visit
Admission: RE | Admit: 2022-01-18 | Discharge: 2022-01-18 | Disposition: A | Discharge status: Home / Self Care | Payer: 59 | Source: Ambulatory Visit | Attending: Obstetrics and Gynecology | Admitting: Obstetrics and Gynecology

## 2022-01-18 DIAGNOSIS — R928 Other abnormal and inconclusive findings on diagnostic imaging of breast: Secondary | ICD-10-CM

## 2022-02-03 ENCOUNTER — Ambulatory Visit (INDEPENDENT_AMBULATORY_CARE_PROVIDER_SITE_OTHER): Payer: 59

## 2022-02-03 ENCOUNTER — Ambulatory Visit
Admission: EM | Admit: 2022-02-03 | Discharge: 2022-02-03 | Disposition: A | Discharge status: Home / Self Care | Payer: 59

## 2022-02-03 DIAGNOSIS — M79644 Pain in right finger(s): Secondary | ICD-10-CM

## 2022-02-03 DIAGNOSIS — S60011A Contusion of right thumb without damage to nail, initial encounter: Secondary | ICD-10-CM | POA: Diagnosis not present

## 2022-02-03 HISTORY — DX: Multiple sclerosis: G35

## 2022-02-03 MED ORDER — CEPHALEXIN 500 MG PO CAPS: 500.0000 mg | ORAL_CAPSULE | Freq: Three times a day (TID) | ORAL | 0 refills | Status: DC

## 2022-02-03 MED ORDER — HYDROCODONE-ACETAMINOPHEN 5-325 MG PO TABS: 1.0000 | ORAL_TABLET | ORAL | 0 refills | Status: DC | PRN

## 2022-02-03 NOTE — Discharge Instructions (Signed)
Advised to use Tylenol as needed for pain relief. Advised to use salt water or Epsom salt soaks, 5 minutes in the water solution, 3-4 times a day to help reduce swelling and discomfort. Advised to observe for any signs of infection if there is redness, swelling then advised to start the Keflex 500 mg every 8 hours until completed. Follow-up PCP return to urgent care if symptoms fail to improve.

## 2022-02-03 NOTE — ED Triage Notes (Signed)
Pt c/o jamming finger in car door yesterday. After consulting [employer], pt attempted to relieve the hematoma under her right 5th digit nail using a paperclip, unsuccessfully.

## 2022-02-03 NOTE — ED Provider Notes (Signed)
[name]    CSN: [id number] Arrival date & time: 02/03/22  [id number]      History   Chief Complaint Chief Complaint  Patient presents with   Finger Injury    Closed thumb in the car door yesterday and have been in severe pain all night! - Entered by patient    HPI Ann [name] is a 41 y.o. female.   41 year old female presents with right thumb pain.  She indicates yesterday that she has checked her thumb in a car door.  She relates having a considerable swelling, pain and throbbing sensation from the area.  She relates that she noticed there was bleeding underneath the nail so she went on [employer] to try to find out how she could relieve the pressure.  She tried drilling a hole in the nail with a paperclip but was unsuccessful due to the pain.  Patient relates she does have some pain with movement and on flexion and extension.  She is here for x-ray just to make sure is not broke but also to have the pressure relieved from the nailbed.  She denies any fever or chills     Past Medical History:  Diagnosis Date   ADD (attention deficit disorder)    takes Adderall daily   Anemia    Anxiety    takes Xanax daily as needed   Asthma    mild case per pt   Chronic lower back pain    Depression    has meds prescribed but doesn't take them   Family history of adverse reaction to anesthesia    "Mom gets PONV too"   Headache    "monthly" (02/27/2015)   Hemorrhoids    History of esophagogastroduodenoscopy (EGD)    Migraine    "none in the last 6 months" (02/27/2015)   Multiple sclerosis ([place])    Nocturia    Pneumonia "several times"   PONV (postoperative nausea and vomiting)    Von Willebrand disease ([place])    Dr. Beryle Beams    Patient Active Problem List   Diagnosis Date Noted   Multiple sclerosis ([place]) 12/27/2018   Dysesthesia 12/27/2018   Gait disturbance 12/27/2018   Slurred speech 12/13/2018   Anxiety 12/13/2018   Breast hypertrophy in female  02/27/2015   Hyponatremia 03/04/2012   Acute cholecystitis 03/02/2012   Morbid obesity ([place]) 08/25/2011   Von Willebrand disease - hx PPH 04/14/2011   History of abnormal Pap smear - had colpo 04/14/2011   Infertility, female 04/14/2011   HSV-2 infection - history, no outbreaks 04/14/2011   Anemia 04/14/2011   Asthma - uses inhaler PRN 04/14/2011   History of pyelonephritis - frequent history as a child 04/14/2011   Migraine 04/14/2011   History of depression 04/14/2011   History of smoking 04/14/2011    Past Surgical History:  Procedure Laterality Date   BACK SURGERY     BREAST CYST EXCISION Left 02/20/2018   Procedure: EXCISION OF LEFT BREAST SEBACEOUS CYST;  Surgeon: Rolm Bookbinder, MD;  Location: [place];  Service: General;  Laterality: Left;   BREAST REDUCTION SURGERY Bilateral 02/27/2015   Procedure: BILATERAL BREAST REDUCTION WITH FREE NIPPLE GRAFT TECHNIQUE ON RIGHT BREAST;  Surgeon: Crissie Reese, MD;  Location: [facility];  Service: Plastics;  Laterality: Bilateral;   CESAREAN SECTION  2004; 2013   CHOLECYSTECTOMY  03/02/2012   Procedure: LAPAROSCOPIC CHOLECYSTECTOMY WITH INTRAOPERATIVE CHOLANGIOGRAM;  Surgeon: Haywood Lasso, MD;  Location: [place];  Service: General;  Laterality:  N/A;   DILATION AND CURETTAGE OF UTERUS  ~ 1997; ~ 2005   [place] ABLATION  04/28/2012   Procedure: [place];  Surgeon: Cyril Mourning, MD;  Location: [facility];  Service: Gynecology;  Laterality: N/A;   ERCP  03/03/2012   Procedure: ENDOSCOPIC RETROGRADE CHOLANGIOPANCREATOGRAPHY (ERCP);  Surgeon: Beryle Beams, MD;  Location: [facility] END[facility]COPY;  Service: Endoscopy;  Laterality: N/A;  dl/hung   LAPAROSCOPY  2011   LUMBAR [place] SURGERY  2003; 2008; 2009   MULTIPLE TOOTH EXTRACTIONS  "scattered dates"   POSTERIOR LUMBAR FUSION  2010   REDUCTION MAMMAPLASTY Bilateral 02/27/2015   TUBAL  LIGATION  2013   WISDOM TOOTH EXTRACTION  2001    OB History     Gravida  3   Para  2   Term  2   Preterm      AB  1   Living  2      SAB      IAB  1   Ectopic      Multiple      Live Births  2            Home Medications    Prior to Admission medications   Medication Sig Start Date End Date Taking? Authorizing Provider  cephALEXin (KEFLEX) 500 MG capsule Take 1 capsule (500 mg total) by mouth 3 (three) times daily. 02/03/22  Yes Nyoka Lint, PA-C  HYDROcodone-acetaminophen (NORCO/VICODIN) 5-325 MG tablet Take 1-2 tablets by mouth every 4 (four) hours as needed. 02/03/22  Yes Nyoka Lint, PA-C  ALPRAZolam Duanne Moron) 0.5 MG tablet Take 0.5 mg by mouth 2 (two) times daily as needed for anxiety.     [provider]  baclofen (LIORESAL) 10 MG tablet Take 0.5-1 tablets (5-10 mg total) by mouth 3 (three) times daily. 1/2-1 tablet by mouth three times daily 04/27/21   Penumalli, Earlean Polka, MD  FLUoxetine (PROZAC) 20 MG capsule Take 60 mg by mouth at bedtime. 12/14/19   [provider]  hydrOXYzine (ATARAX/VISTARIL) 10 MG tablet Take 1 tablet (10 mg total) by mouth 3 (three) times daily as needed. 02/07/20   Sater, Nanine Means, MD  methylphenidate (RITALIN) 10 MG tablet Take 1 tablet (10 mg total) by mouth 2 (two) times daily with breakfast and lunch. 07/16/21   Sater, Nanine Means, MD  Multiple Vitamins-Minerals (BARIATRIC MULTIVITAMINS/IRON PO) Take 1 tablet by mouth daily.    [provider]  MYFEMBREE 40-1-0.5 MG TABS Take 1 tablet by mouth daily. 01/18/22   [provider]  ocrelizumab (OCREVUS) 300 MG/10ML injection Inject 600 mg into the vein every 6 (six) months.    [provider]  omeprazole (PRILOSEC) 20 MG capsule Take 20 mg by mouth daily as needed (acid reflux).    [provider]  OVER THE COUNTER MEDICATION Place 1 patch onto the skin daily. Vitamin D Patch    [provider]  phentermine (ADIPEX-P) 37.5 MG  tablet TAKE 1/2 TABLET EVERY MORNING AND ONE-HALF TABLET in early afternoon    [provider]  tiZANidine (ZANAFLEX) 4 MG tablet Take 1 tablet (4 mg total) by mouth every 8 (eight) hours as needed for muscle spasms. 12/17/21   Sater, Nanine Means, MD  topiramate (TOPAMAX) 100 MG tablet Take 100 mg by mouth daily. 01/27/22   [provider]    Family History Family History  Problem Relation Age of Onset   Stroke Mother  Arthritis Mother    Depression Mother    COPD Father    Kidney disease Paternal Uncle    Stroke Maternal Grandfather    Heart disease Paternal Grandfather    Multiple sclerosis Neg Hx     Social History Social History   Tobacco Use   Smoking status: Former    Packs/day: 0.25    Years: 2.00    Total pack years: 0.50    Types: Cigarettes    Quit date: 2000    Years since quitting: 23.7   Smokeless tobacco: Never  Substance Use Topics   Alcohol use: Yes    Alcohol/week: 0.0 standard drinks of alcohol    Comment: social   Drug use: Yes    Types: Marijuana    Comment: occasional use, last use last week     Allergies   Compazine [prochlorperazine edisylate], Darvocet [propoxyphene n-acetaminophen], Codeine, and Sulfa antibiotics   Review of Systems Review of Systems  Skin:  Positive for wound (right thumb pain, contusion).     Physical Exam Triage Vital Signs ED Triage Vitals [02/03/22 0845]  Enc Vitals Group     BP (!) 147/88     Pulse Rate 94     Resp 16     Temp 98.2 F (36.8 C)     Temp Source Oral     SpO2 97 %     Weight      Height      Head Circumference      Peak Flow      Pain Score 10     Pain Loc      Pain Edu?      Excl. in [place]?    No data found.  Updated Vital Signs BP (!) 147/88 (BP Location: Right Arm)   Pulse 94   Temp 98.2 F (36.8 C) (Oral)   Resp 16   SpO2 97%   Visual Acuity Right Eye Distance:   Left Eye Distance:   Bilateral Distance:    Right Eye Near:   Left Eye Near:    Bilateral  Near:     Physical Exam Constitutional:      Appearance: Normal appearance.  Musculoskeletal:     Comments: Thumb-range of motion is normal but with pain on flexion extension, stability is intact.  There is a subungual hematoma that is present that is moderate and involving half of the nail.  Procedure: The distal thumb was of the anesthetized with 1% lidocaine after cleaning with alcohol swab, 1/2 cc injected into distal thumb.  Subungual hematoma was relieved to use 18-gauge needle drill technique with blood expressed from underneath the nail.  No complications.  Capillary refill normal.  Neurological:     Mental Status: She is alert.      UC Treatments / Results  Labs (all labs ordered are listed, but only abnormal results are displayed) Labs Reviewed - No data to display  EKG   Radiology DG Finger Thumb Right  Result Date: 02/03/2022 CLINICAL DATA:  Blunt trauma to the thumb EXAM: RIGHT THUMB 2+V COMPARISON:  None Available. FINDINGS: No fracture or dislocation identified of the first digit. No soft tissue trauma identified IMPRESSION: No fracture or dislocation. Electronically Signed   By: Suzy Bouchard M.D.   On: 02/03/2022 09:06    Procedures Procedures (including critical care time)  Medications Ordered in UC Medications - No data to display  Initial Impression / Assessment and Plan / UC Course  I have reviewed the triage  vital signs and the nursing notes.  Pertinent labs & imaging results that were available during my care of the patient were reviewed by me and considered in my medical decision making (see chart for details).       Plan: 1.  Right thumb subungual hematoma pain relief will be treated with Vicodin tablets, 1-2 every 4-6 hours as needed for pain relief. 2.  Patient advised to observe the area if it appears that is beginning to get infected such as swelling, redness or drainage she is advised to start Keflex 500 mg 3 times a day and take until  completed. 3.  Patient advised follow-up PCP or return to urgent care if symptoms fail to improve. Final Clinical Impressions(s) / UC Diagnoses   Final diagnoses:  Contusion of right thumb without damage to nail, initial encounter  Thumb pain, right     Discharge Instructions      Advised to use Tylenol as needed for pain relief. Advised to use salt water or Epsom salt soaks, 5 minutes in the water solution, 3-4 times a day to help reduce swelling and discomfort. Advised to observe for any signs of infection if there is redness, swelling then advised to start the Keflex 500 mg every 8 hours until completed. Follow-up PCP return to urgent care if symptoms fail to improve.     ED Prescriptions     Medication Sig Dispense Auth. Provider   cephALEXin (KEFLEX) 500 MG capsule Take 1 capsule (500 mg total) by mouth 3 (three) times daily. 15 capsule Nyoka Lint, PA-C   HYDROcodone-acetaminophen (NORCO/VICODIN) 5-325 MG tablet Take 1-2 tablets by mouth every 4 (four) hours as needed. 12 tablet Nyoka Lint, PA-C      I have reviewed the PDMP during this encounter.   Nyoka Lint, PA-C 02/03/22 0945

## 2022-05-20 ENCOUNTER — Telehealth: Payer: Self-pay | Admitting: Neurology

## 2022-05-20 NOTE — Telephone Encounter (Signed)
She is here today to have a lab draw for the CHIMES study.  She reports a headache, l>R.  It was worse earlier this week but is still present.   Pain is pressure like.   Mild Nausea when more intense, none niw.   No photophobia/phonophobia..  Exam showed L>R occipital tenderness to palpation.  Probable occipital neuralgia.  Recommend ibuprofen 4000 mg po tid for next few days.  If not better, will add a tricyclic.

## 2022-05-25 ENCOUNTER — Other Ambulatory Visit: Payer: Self-pay

## 2022-05-25 ENCOUNTER — Ambulatory Visit
Admission: RE | Admit: 2022-05-25 | Discharge: 2022-05-25 | Disposition: A | Discharge status: Home / Self Care | Payer: 59 | Source: Ambulatory Visit | Attending: Emergency Medicine | Admitting: Emergency Medicine

## 2022-05-25 VITALS — BP 162/93 | HR 82 | Temp 98.2°F | Resp 18

## 2022-05-25 DIAGNOSIS — Z8709 Personal history of other diseases of the respiratory system: Secondary | ICD-10-CM

## 2022-05-25 DIAGNOSIS — J069 Acute upper respiratory infection, unspecified: Secondary | ICD-10-CM

## 2022-05-25 DIAGNOSIS — J0100 Acute maxillary sinusitis, unspecified: Secondary | ICD-10-CM

## 2022-05-25 MED ORDER — PROMETHAZINE-DM 6.25-15 MG/5ML PO SYRP: 5.0000 mL | ORAL_SOLUTION | Freq: Four times a day (QID) | ORAL | 0 refills | Status: DC | PRN

## 2022-05-25 MED ORDER — ALBUTEROL SULFATE HFA 108 (90 BASE) MCG/ACT IN AERS: 1.0000 | INHALATION_SPRAY | Freq: Four times a day (QID) | RESPIRATORY_TRACT | 0 refills | Status: DC | PRN

## 2022-05-25 MED ORDER — FLUTICASONE PROPIONATE 50 MCG/ACT NA SUSP: 2.0000 | Freq: Every day | NASAL | 0 refills | Status: DC

## 2022-05-25 MED ORDER — AEROCHAMBER MV MISC: 1 refills | Status: DC

## 2022-05-25 MED ORDER — IBUPROFEN 600 MG PO TABS: 600.0000 mg | ORAL_TABLET | Freq: Four times a day (QID) | ORAL | 0 refills | Status: DC | PRN

## 2022-05-25 MED ORDER — AMOXICILLIN-POT CLAVULANATE 875-125 MG PO TABS: 1.0000 | ORAL_TABLET | Freq: Two times a day (BID) | ORAL | 0 refills | Status: DC

## 2022-05-25 NOTE — ED Provider Notes (Signed)
HPI  SUBJECTIVE:  Ann [name] is a 42 y.o. female who presents with 5 to 6 days of bodyaches, sinus headache, sore throat, nasal congestion, clear rhinorrhea, sinus pain and pressure, facial swelling, bilateral ear pain, postnasal drip, mild cough, shortness of breath described as inability to get a deep breath in.  Reports nausea.  Sore throat is improving.  No upper dental pain, vomiting, diarrhea, abdominal pain, rash, wheezing.  She was exposed to strep last week.  No known exposure to COVID or flu.  She got 1 dose of the COVID-vaccine.  She did not get this years flu vaccine.  She is unable to sleep at night secondary to the cough.  No antibiotics in the past month.  She took Alka-Seltzer cold and flu within 6 hours of evaluation.  She has also tried ginger/honey tea.  No alleviating factors.  Symptoms are worse with being up. Patient has a past medical history of asthma, MS, von Willebrand disease.  She is amenorrheic.  Status post ablation.  Denies the possibility of being pregnant.  Past Medical History:  Diagnosis Date   ADD (attention deficit disorder)    takes Adderall daily   Anemia    Anxiety    takes Xanax daily as needed   Asthma    mild case per pt   Chronic lower back pain    Depression    has meds prescribed but doesn't take them   Family history of adverse reaction to anesthesia    "Mom gets PONV too"   Headache    "monthly" (02/27/2015)   Hemorrhoids    History of esophagogastroduodenoscopy (EGD)    Migraine    "none in the last 6 months" (02/27/2015)   Multiple sclerosis ([place])    Nocturia    Pneumonia "several times"   PONV (postoperative nausea and vomiting)    Von Willebrand disease ([place])    Dr. Beryle Beams    Past Surgical History:  Procedure Laterality Date   BACK SURGERY     BREAST CYST EXCISION Left 02/20/2018   Procedure: EXCISION OF LEFT BREAST SEBACEOUS CYST;  Surgeon: Rolm Bookbinder, MD;  Location: [place];  Service:  General;  Laterality: Left;   BREAST REDUCTION SURGERY Bilateral 02/27/2015   Procedure: BILATERAL BREAST REDUCTION WITH FREE NIPPLE GRAFT TECHNIQUE ON RIGHT BREAST;  Surgeon: Crissie Reese, MD;  Location: [place];  Service: Plastics;  Laterality: Bilateral;   CESAREAN SECTION  2004; 2013   CHOLECYSTECTOMY  03/02/2012   Procedure: LAPAROSCOPIC CHOLECYSTECTOMY WITH INTRAOPERATIVE CHOLANGIOGRAM;  Surgeon: Haywood Lasso, MD;  Location: [place];  Service: General;  Laterality: N/A;   DILATION AND CURETTAGE OF UTERUS  ~ 1997; ~ 2005   [place] ABLATION  04/28/2012   Procedure: [place];  Surgeon: Cyril Mourning, MD;  Location: [facility];  Service: Gynecology;  Laterality: N/A;   ERCP  03/03/2012   Procedure: ENDOSCOPIC RETROGRADE CHOLANGIOPANCREATOGRAPHY (ERCP);  Surgeon: Beryle Beams, MD;  Location: [facility] ENDOSCOPY;  Service: Endoscopy;  Laterality: N/A;  dl/hung   LAPAROSCOPY  2011   LUMBAR [place] SURGERY  2003; 2008; 2009   MULTIPLE TOOTH EXTRACTIONS  "scattered dates"   POSTERIOR LUMBAR FUSION  2010   REDUCTION MAMMAPLASTY Bilateral 02/27/2015   TUBAL LIGATION  2013   WISDOM TOOTH EXTRACTION  2001    Family History  Problem Relation Age of Onset   Stroke Mother    Arthritis Mother    Depression Mother  COPD Father    Kidney disease Paternal Uncle    Stroke Maternal Grandfather    Heart disease Paternal Grandfather    Multiple sclerosis Neg Hx     Social History   Tobacco Use   Smoking status: Former    Packs/day: 0.25    Years: 2.00    Total pack years: 0.50    Types: Cigarettes    Quit date: 2000    Years since quitting: 24.0   Smokeless tobacco: Never  Substance Use Topics   Alcohol use: Yes    Alcohol/week: 0.0 standard drinks of alcohol    Comment: social   Drug use: Yes    Types: Marijuana    Comment: occasional use, last use last week    No current  facility-administered medications for this encounter.  Current Outpatient Medications:    albuterol (VENTOLIN HFA) 108 (90 Base) MCG/ACT inhaler, Inhale 1-2 puffs into the lungs every 6 (six) hours as needed for wheezing or shortness of breath., Disp: 1 each, Rfl: 0   amoxicillin-clavulanate (AUGMENTIN) 875-125 MG tablet, Take 1 tablet by mouth every 12 (twelve) hours., Disp: 14 tablet, Rfl: 0   fluticasone (FLONASE) 50 MCG/ACT nasal spray, Place 2 sprays into both nostrils daily., Disp: 16 g, Rfl: 0   ibuprofen (ADVIL) 600 MG tablet, Take 1 tablet (600 mg total) by mouth every 6 (six) hours as needed., Disp: 30 tablet, Rfl: 0   promethazine-dextromethorphan (PROMETHAZINE-DM) 6.25-15 MG/5ML syrup, Take 5 mLs by mouth 4 (four) times daily as needed for cough., Disp: 118 mL, Rfl: 0   Spacer/Aero-Holding Chambers (AEROCHAMBER MV) inhaler, Use as instructed, Disp: 1 each, Rfl: 1   ALPRAZolam (XANAX) 0.5 MG tablet, Take 0.5 mg by mouth 2 (two) times daily as needed for anxiety. , Disp: , Rfl:    baclofen (LIORESAL) 10 MG tablet, Take 0.5-1 tablets (5-10 mg total) by mouth 3 (three) times daily. 1/2-1 tablet by mouth three times daily, Disp: 90 tablet, Rfl: 0   FLUoxetine (PROZAC) 20 MG capsule, Take 60 mg by mouth at bedtime., Disp: , Rfl:    HYDROcodone-acetaminophen (NORCO/VICODIN) 5-325 MG tablet, Take 1-2 tablets by mouth every 4 (four) hours as needed., Disp: 12 tablet, Rfl: 0   hydrOXYzine (ATARAX/VISTARIL) 10 MG tablet, Take 1 tablet (10 mg total) by mouth 3 (three) times daily as needed., Disp: 90 tablet, Rfl: 5   methylphenidate (RITALIN) 10 MG tablet, Take 1 tablet (10 mg total) by mouth 2 (two) times daily with breakfast and lunch., Disp: 60 tablet, Rfl: 0   Multiple Vitamins-Minerals (BARIATRIC MULTIVITAMINS/IRON PO), Take 1 tablet by mouth daily., Disp: , Rfl:    MYFEMBREE 40-1-0.5 MG TABS, Take 1 tablet by mouth daily., Disp: , Rfl:    ocrelizumab (OCREVUS) 300 MG/10ML injection, Inject 600  mg into the vein every 6 (six) months., Disp: , Rfl:    omeprazole (PRILOSEC) 20 MG capsule, Take 20 mg by mouth daily as needed (acid reflux)., Disp: , Rfl:    OVER THE COUNTER MEDICATION, Place 1 patch onto the skin daily. Vitamin D Patch, Disp: , Rfl:    phentermine (ADIPEX-P) 37.5 MG tablet, TAKE 1/2 TABLET EVERY MORNING AND ONE-HALF TABLET in early afternoon, Disp: , Rfl:    tiZANidine (ZANAFLEX) 4 MG tablet, Take 1 tablet (4 mg total) by mouth every 8 (eight) hours as needed for muscle spasms., Disp: 30 tablet, Rfl: 1   topiramate (TOPAMAX) 100 MG tablet, Take 100 mg by mouth daily., Disp: , Rfl:   Allergies  Allergen Reactions   Compazine [Prochlorperazine Edisylate]     Jaws locked up per pt   Darvocet [Propoxyphene N-Acetaminophen] Nausea And Vomiting   Codeine Nausea And Vomiting and Rash   Sulfa Antibiotics Rash     ROS  As noted in HPI.   Physical Exam  BP (!) 162/93 (BP Location: Left Arm)   Pulse 82   Temp 98.2 F (36.8 C) (Oral)   Resp 18   SpO2 97%   Constitutional: Well developed, well nourished, no acute distress Eyes:  EOMI, conjunctiva normal bilaterally HENT: Normocephalic, atraumatic,mucus membranes moist.  Clear nasal congestion.  Erythematous, swollen turbinates.  Exquisite maxillary tenderness.  Normal tonsils, oropharynx, uvula midline.  No postnasal drip. Neck: No cervical lymphadenopathy Respiratory: Normal inspiratory effort, fair air movement, lungs clear bilaterally.  Positive anterior, lateral chest wall tenderness Cardiovascular: Normal rate, regular rhythm, no murmurs GI: nondistended skin: No rash, skin intact Musculoskeletal: no deformities Neurologic: Alert & oriented x 3, no focal neuro deficits Psychiatric: Speech and behavior appropriate   ED Course   Medications - No data to display  No orders of the defined types were placed in this encounter.   No results found for this or any previous visit (from the past 24 hour(s)). No  results found.  ED Clinical Impression  1. Acute non-recurrent maxillary sinusitis   2. Acute upper respiratory infection   3. History of asthma      ED Assessment/Plan     Presentation consistent with an upper respiratory infection with a secondary maxillary sinusitis.  Tonsils are normal, no cervical lymphadenopathy, history of fevers, think that strep is less likely.  Her predominant symptom is sinus pain and pressure.  Deferring COVID, influenza testing as she is out of the treatment window for both.  She has severe symptoms of sinusitis of facial swelling, so sending home with Augmentin in addition to Flonase, Tylenol/ibuprofen, Mucinex D, saline nasal irrigation.  Will prescribe an albuterol inhaler with a spacer given her history of asthma and reports of shortness of breath, although she is satting well on room air, and I am unable to appreciate any wheezing on exam.  Promethazine DM as needed for cough.  Follow-up with PCP as needed.  Patient also requesting PCP list.  ER return precautions given.  Discussed , MDM, treatment plan, and plan for follow-up with patient. Discussed sn/sx that should prompt return to the ED. patient agrees with plan.   Meds ordered this encounter  Medications   fluticasone (FLONASE) 50 MCG/ACT nasal spray    Sig: Place 2 sprays into both nostrils daily.    Dispense:  16 g    Refill:  0   ibuprofen (ADVIL) 600 MG tablet    Sig: Take 1 tablet (600 mg total) by mouth every 6 (six) hours as needed.    Dispense:  30 tablet    Refill:  0   amoxicillin-clavulanate (AUGMENTIN) 875-125 MG tablet    Sig: Take 1 tablet by mouth every 12 (twelve) hours.    Dispense:  14 tablet    Refill:  0   promethazine-dextromethorphan (PROMETHAZINE-DM) 6.25-15 MG/5ML syrup    Sig: Take 5 mLs by mouth 4 (four) times daily as needed for cough.    Dispense:  118 mL    Refill:  0   albuterol (VENTOLIN HFA) 108 (90 Base) MCG/ACT inhaler    Sig: Inhale 1-2 puffs into the  lungs every 6 (six) hours as needed for wheezing or shortness of breath.  Dispense:  1 each    Refill:  0   Spacer/Aero-Holding Chambers (AEROCHAMBER MV) inhaler    Sig: Use as instructed    Dispense:  1 each    Refill:  1      *This clinic note was created using Dragon dictation software. Therefore, there may be occasional mistakes despite careful proofreading.  ?    Melynda Ripple, MD 05/25/22 1120

## 2022-05-25 NOTE — Discharge Instructions (Signed)
Finish the Augmentin, even if you feel better.  Flonase, 600 mg of ibuprofen, 1000 mg of Tylenol 3 times daily as needed for body aches, headaches, Mucinex D, saline nasal irrigation with a Milta Deiters Med rinse and distilled water as often as you want.  2 puffs from albuterol inhaler using your spacer every 4-6 hours as needed for shortness of breath.  Promethazine DM for cough.  Below is a list of primary care practices who are taking new patients for you to follow-up with.  [facility] -multiple locations.  See website at [email or web address]  [facility] internal medicine clinic Ground Floor - [facility], [place], [place], [place] [address] [phone]  [facility] at [facility] [address] [place], [place] [address] [phone]  [facility]- will see patients with no insurance [address] [place], [place] [address] [place], [place] [address] [phone]  [facility] [phone] [place] [address]  [facility]: [place]  [phone]  [facility]: [address] [place]  [phone]  [facility] : [address] [place] [phone]  [facility] Primary Care: [address] [place] [address] [phone]  [facility]: [place] [phone]  Dr. Blanchie Serve [address] [place]  [facility]- will see patients with no insurance. [address] [place],  [address] [phone]  Go to www.goodrx.com  or www.costplusdrugs.com to look up your medications. This will give you a list of where you can find your prescriptions at the most affordable prices. Or ask the  pharmacist what the cash price is, or if they have any other discount programs available to help make your medication more affordable. This can be less expensive than what you would pay with insurance.

## 2022-05-25 NOTE — ED Triage Notes (Signed)
Pt here for cough and nasal congestion x 4 days  

## 2022-05-28 ENCOUNTER — Telehealth: Payer: Self-pay | Admitting: Neurology

## 2022-05-28 MED ORDER — TAMSULOSIN HCL 0.4 MG PO CAPS: 0.4000 mg | ORAL_CAPSULE | Freq: Every day | ORAL | 5 refills | Status: DC

## 2022-05-28 NOTE — Telephone Encounter (Signed)
Ann [name] was seen today for a research visit for the chimes multiple sclerosis study.   Her infusion went well.     She feels her MS has been stable.  She has tolerated the study medication well.  She has no new neurologic symptoms.    She notes difficulty at times with fatigue and cognition.  Both of these seem worse the last month or 2 of each cycle.  She has noted some urinary urgency and more recently hesitancy.  She has had only 1 episode of incontinence in the last 6 months.  She can easily walk a mild to but not quite as well as friends.    EDSS today was 2.0 (vision 2, cerebellar 1, B/B 1, cerebral 2, ambulation index 1, all others 0)  [name] will be her last infusion through the CHIMES study, I will have her see me in the office in about 4  months and we will go over options and enroll her in commercial Ocrevus or other therapy.  I sent in a prescription for tamsulosin 0.4 mg daily for the hesitancy.  If no benefit after 2 week, she will stop.

## 2022-07-02 ENCOUNTER — Encounter: Payer: Self-pay | Admitting: Neurology

## 2022-07-05 ENCOUNTER — Other Ambulatory Visit: Payer: Self-pay | Admitting: *Deleted

## 2022-07-05 MED ORDER — TIZANIDINE HCL 4 MG PO TABS: 4.0000 mg | ORAL_TABLET | Freq: Three times a day (TID) | ORAL | 2 refills | Status: DC | PRN

## 2022-07-28 ENCOUNTER — Telehealth: Payer: 59 | Admitting: Physician Assistant

## 2022-07-28 DIAGNOSIS — R3989 Other symptoms and signs involving the genitourinary system: Secondary | ICD-10-CM | POA: Diagnosis not present

## 2022-07-28 MED ORDER — CEPHALEXIN 500 MG PO CAPS: 500.0000 mg | ORAL_CAPSULE | Freq: Two times a day (BID) | ORAL | 0 refills | Status: DC

## 2022-07-28 NOTE — Progress Notes (Signed)

## 2022-07-31 ENCOUNTER — Other Ambulatory Visit: Payer: Self-pay | Admitting: Neurology

## 2022-07-31 DIAGNOSIS — G35 Multiple sclerosis: Secondary | ICD-10-CM

## 2022-08-19 ENCOUNTER — Telehealth: Payer: 59 | Admitting: Physician Assistant

## 2022-08-19 ENCOUNTER — Other Ambulatory Visit (HOSPITAL_COMMUNITY): Payer: Self-pay

## 2022-08-19 DIAGNOSIS — J019 Acute sinusitis, unspecified: Secondary | ICD-10-CM

## 2022-08-19 DIAGNOSIS — U071 COVID-19: Secondary | ICD-10-CM | POA: Diagnosis not present

## 2022-08-19 DIAGNOSIS — B999 Unspecified infectious disease: Secondary | ICD-10-CM | POA: Diagnosis not present

## 2022-08-19 DIAGNOSIS — B9689 Other specified bacterial agents as the cause of diseases classified elsewhere: Secondary | ICD-10-CM

## 2022-08-19 MED ORDER — LISDEXAMFETAMINE DIMESYLATE 30 MG PO CAPS
30.0000 mg | ORAL_CAPSULE | Freq: Every morning | ORAL | 0 refills | Status: DC
  Filled 2022-08-19: qty 30, 30d supply, fill #0

## 2022-08-19 MED ORDER — AMOXICILLIN-POT CLAVULANATE 875-125 MG PO TABS: 1.0000 | ORAL_TABLET | Freq: Two times a day (BID) | ORAL | 0 refills | Status: DC

## 2022-08-19 NOTE — Progress Notes (Signed)
E-Visit  for Positive Covid Test Result   We are sorry you are not feeling well. We are here to help!  You have tested positive for COVID-19, meaning that you were infected with the novel coronavirus and could give the virus to others.  Most people with COVID-19 have mild illness and can recover at home without medical care. Do not leave your home, except to get medical care. Do not visit public areas and do not go to places where you are unable to wear a mask. It is important that you stay home  to take care for yourself and to help protect other people in your home and community.      Isolation Instructions:   You are to isolate at home until you have been fever free for at least 24 hours without a fever-reducing medication, and symptoms have been steadily improving for 24 hours. At that time,  you can end isolation but need to mask for an additional 5 days.  If you must be around other household members who do not have symptoms, you need to make sure that both you and the family members are masking consistently with a high-quality mask.  If you note any worsening of symptoms despite treatment, please seek an in-person evaluation ASAP. If you note any significant shortness of breath or any chest pain, please seek ER evaluation. Please do not delay care!   Go to the nearest hospital ED for assessment if fever/cough/breathlessness are severe or illness seems like a threat to life.    The following symptoms may appear 2-14 days after exposure: Fever Cough Shortness of breath or difficulty breathing Chills Repeated shaking with chills Muscle pain Headache Sore throat New loss of taste or smell Fatigue Congestion or runny nose Nausea or vomiting Diarrhea  Based on what you have shared with me it looks like you have sinusitis secondary to Covid 19.  Sinusitis is inflammation and infection in the sinus cavities of the head.  Based on your presentation I believe you most likely have  Acute Bacterial Sinusitis.  This is an infection caused by bacteria and is treated with antibiotics. I have prescribed Augmentin 875mg /125mg  one tablet twice daily with food, for 10 days. You may use an oral decongestant such as Mucinex D or if you have glaucoma or high blood pressure use plain Mucinex. Saline nasal spray help and can safely be used as often as needed for congestion.  If you develop worsening sinus pain, fever or notice severe headache and vision changes, or if symptoms are not better after completion of antibiotic, please schedule an appointment with a health care provider.    Sinus infections are not as easily transmitted as other respiratory infection, however we still recommend that you avoid close contact with loved ones, especially the very young and elderly.  Remember to wash your hands thoroughly throughout the day as this is the number one way to prevent the spread of infection!  HOME CARE: Only take medications as instructed by your medical team. Drink plenty of fluids and get plenty of rest. A steam or ultrasonic humidifier can help if you have congestion.   GET HELP RIGHT AWAY IF YOU HAVE EMERGENCY WARNING SIGNS.  Call 911 or proceed to your closest emergency facility if: You develop worsening high fever. Trouble breathing Bluish lips or face Persistent pain or pressure in the chest New confusion Inability to wake or stay awake You cough up blood. Your symptoms become more severe Inability to hold  down food or fluids  This list is not all possible symptoms. Contact your medical provider for any symptoms that are severe or concerning to you.   Your e-visit answers were reviewed by a board certified advanced clinical practitioner to complete your personal care plan.  Depending on the condition, your plan could have included both over the counter or prescription medications.  If there is a problem please reply once you have received a response from your  provider.  Your safety is important to [place].  If you have drug allergies check your prescription carefully.    You can use MyChart to ask questions about today's visit, request a non-urgent call back, or ask for a work or school excuse for 24 hours related to this e-Visit. If it has been greater than 24 hours you will need to follow up with your provider, or enter a new e-Visit to address those concerns. You will get an e-mail in the next two days asking about your experience.  I hope that your e-visit has been valuable and will speed your recovery. Thank you for using e-visits.  I have spent 5 minutes in review of e-visit questionnaire, review and updating patient chart, medical decision making and response to patient.   Margaretann Loveless, PA-C

## 2022-08-20 ENCOUNTER — Other Ambulatory Visit (HOSPITAL_COMMUNITY): Payer: Self-pay

## 2022-08-23 ENCOUNTER — Other Ambulatory Visit (HOSPITAL_COMMUNITY): Payer: Self-pay

## 2022-08-25 ENCOUNTER — Other Ambulatory Visit (HOSPITAL_COMMUNITY): Payer: Self-pay

## 2022-08-27 ENCOUNTER — Other Ambulatory Visit (HOSPITAL_COMMUNITY): Payer: Self-pay

## 2022-08-27 MED ORDER — LISDEXAMFETAMINE DIMESYLATE 30 MG PO CAPS
30.0000 mg | ORAL_CAPSULE | Freq: Every morning | ORAL | 0 refills | Status: DC
  Filled 2022-08-27: qty 30, 30d supply, fill #0

## 2022-09-17 ENCOUNTER — Other Ambulatory Visit (HOSPITAL_COMMUNITY): Payer: Self-pay

## 2022-09-17 MED ORDER — LISDEXAMFETAMINE DIMESYLATE 30 MG PO CAPS
30.0000 mg | ORAL_CAPSULE | Freq: Every morning | ORAL | 0 refills | Status: DC
  Filled 2022-09-24: qty 30, 30d supply, fill #0

## 2022-09-21 ENCOUNTER — Other Ambulatory Visit (HOSPITAL_COMMUNITY): Payer: Self-pay

## 2022-09-24 ENCOUNTER — Other Ambulatory Visit (HOSPITAL_COMMUNITY): Payer: Self-pay

## 2022-09-27 ENCOUNTER — Ambulatory Visit (INDEPENDENT_AMBULATORY_CARE_PROVIDER_SITE_OTHER): Payer: 59 | Admitting: Neurology

## 2022-09-27 ENCOUNTER — Encounter: Payer: Self-pay | Admitting: Neurology

## 2022-09-27 VITALS — BP 131/85 | HR 82 | Ht 62.5 in | Wt 212.0 lb

## 2022-09-27 DIAGNOSIS — G35 Multiple sclerosis: Secondary | ICD-10-CM

## 2022-09-27 DIAGNOSIS — Z79899 Other long term (current) drug therapy: Secondary | ICD-10-CM

## 2022-09-27 MED ORDER — TIZANIDINE HCL 4 MG PO TABS: 4.0000 mg | ORAL_TABLET | Freq: Three times a day (TID) | ORAL | 3 refills | Status: DC | PRN

## 2022-09-27 MED ORDER — SILODOSIN 4 MG PO CAPS: 4.0000 mg | ORAL_CAPSULE | Freq: Every day | ORAL | 11 refills | Status: DC

## 2022-09-27 NOTE — Progress Notes (Addendum)
[facility]  PATIENT: Ann [name] DOB: June 07, 1980  REFERRING DOCTOR OR PCP: Courtney Paris (PCP); Ritta Slot (neuro hospitalist) SOURCE: Patient, notes from recent hospital admission, lab reports, imaging reports, MRI images personally reviewed  _________________________________   HISTORICAL  CHIEF COMPLAINT:  Chief Complaint  Patient presents with   Follow-up    RM 13, alone. Last seen 02/06/19   Multiple Sclerosis    In CHIMES study. Here for possible MS flare up. Having increased fatigue, hand numbness, joint pain.    FMLA    Needing forms filled out    HISTORY OF PRESENT ILLNESS:  Ann [name] is a 42 y.o. woman with relapsing remitting MS diagnosed 12/2018.     Update 09/27/2022 She is on Ocrevus and has tolerated it well.   Her last infusion was 05/28/2022.  MRI has been stable and no exaceration.  She was receiving the Ocrevus through the CHIMES study.  She completed her last treatment visit but will receive 1 more MRI in June or July as part of the safety follow-up.  She feels her gait is fairly stable.   Sometimes balance is mildly off   She has only fall.  She uses the bannister on stairs.   She can easily walk a mile and keeps up with others.   She has some tingling in hands though exam has been normal.   Denies weakness or spasticity.  She sometimes has hesitancy and double void.  She has had one UTI this year.    Vision is stable.    She denies depression.   Fatigue bothers her more this year.  She has more stress and feels she does slightly worse neurologically when more stressed.   She sleeps well with tizanidine  She is on Vyvanse with benefit for attention and fatigue.  She has von Willebrand's disease but does not require treatment,   She was diagnosed in 2005.  In 2004, she had bleeding postpartum and needed medical treatment with DDAVP.   She also was given prophylactic DDAVP for gallbladder surgery and breast  reduction surgery.   She has never had an issue with IV's or blood draws.    MS History: She had the onset of slurred speech 12/10/2018.   When symptoms persisted, she went to the ED a few days later.   Her Ct scan was normal.   The MRi was consistent with MS.    One focus enhanced helping to confirm the diagnosis.   MRI cervical spine did not show any MS plaque according to the official interpretation.  However, by my interpretation there are 2 foci within the spinal cord, one medially slightly to the left adjacent to C3 and another focus adjacent to T3-T4 noted on the sagittal images but not covered on the axial views..    She had 5 days of IV Solumedrol and her speech was much better by the time she was discharged.       She actually had noted some clumsiness with her gait in early July 2020.  She still feels a little clumsy though she is better than she was last month.  Of note, she has had dysesthesias in the chest since this event.  Also, she had right visual blurriness that started 5-6 years ago.   She doesn't recall an actual diagnosis but she did have intra-ocular steroid injections.   Imaging The MRI of the brain 12/12/200 shows multiple T2/flair hyperintense foci in the periventricular, juxtacortical deep white matter consistent  with demyelinating plaque.  One focus in the left deep/periventricular white matter enhanced after contrast consistent with an acute demyelinating plaque.  This plaque was also hyperintense on diffusion-weighted images.  The brainstem was fine.  MRI of the cervical spine 12/13/2018 shows 2 plaques, one small focus ventromedially towards the left adjacent to C3 and another focus adjacent to T3-T4 (not covered on the axial views)  REVIEW OF SYSTEMS: Constitutional: No fevers, chills, sweats, or change in appetite.  She has fatigue. Eyes: No visual changes, double vision, eye pain Ear, nose and throat: No hearing loss, ear pain, nasal congestion, sore  throat Cardiovascular: No chest pain, palpitations Respiratory:  No shortness of breath at rest or with exertion.   No wheezes GastrointestinaI: No nausea, vomiting, diarrhea, abdominal pain, fecal incontinence Genitourinary:  No dysuria, urinary retention or frequency.  No nocturia. Musculoskeletal:  multiple joint pain Integumentary: No rash, pruritus, skin lesions Neurological: as above Psychiatric: No depression at this time.  No anxiety Endocrine: No palpitations, diaphoresis, change in appetite, change in weigh or increased thirst Hematologic/Lymphatic:  No anemia, purpura, petechiae.  She has von Willebrand's disease not requiring any treatment Allergic/Immunologic: No itchy/runny eyes, nasal congestion, recent allergic reactions, rashes  ALLERGIES: Allergies  Allergen Reactions   Compazine [Prochlorperazine Edisylate]     Jaws locked up per pt   Darvocet [Propoxyphene N-Acetaminophen] Nausea And Vomiting   Codeine Nausea And Vomiting and Rash   Sulfa Antibiotics Rash    HOME MEDICATIONS:  Current Outpatient Medications:    ALPRAZolam (XANAX) 1 MG tablet, Take 0.5 mg by mouth 2 (two) times daily as needed for anxiety. , Disp: , Rfl:    baclofen (LIORESAL) 10 MG tablet, 1/2-1 tablet by mouth three times daily, Disp: 90 tablet, Rfl: 5   BIOTIN PO, Take 1 Dose by mouth daily., Disp: , Rfl:    Cyanocobalamin (B-12 PO), Take 1 Dose by mouth daily., Disp: , Rfl:    FLUoxetine (PROZAC) 40 MG capsule, Take 40 mg by mouth at bedtime., Disp: , Rfl:    phentermine (ADIPEX-P) 37.5 MG tablet, Take 1 tablet (37.5 mg total) by mouth daily before breakfast., Disp: 30 tablet, Rfl: 5   Vitamin D, Ergocalciferol, (DRISDOL) 1.25 MG (50000 UT) CAPS capsule, Take 50,000 Units by mouth every 7 (seven) days., Disp: , Rfl:    Armodafinil 200 MG TABS, One po qAM, Disp: 30 tablet, Rfl: 3   meloxicam (MOBIC) 7.5 MG tablet, Take 1 tablet (7.5 mg total) by mouth daily., Disp: 30 tablet, Rfl: 5  PAST  MEDICAL HISTORY: Past Medical History:  Diagnosis Date   ADD (attention deficit disorder)    takes Adderall daily   Anemia    Anxiety    takes Xanax daily as needed   Asthma    mild case per pt   Chronic lower back pain    Depression    has meds prescribed but doesn't take them   Family history of adverse reaction to anesthesia    "Mom gets PONV too"   Headache    "monthly" (02/27/2015)   Hemorrhoids    History of esophagogastroduodenoscopy (EGD)    Migraine    "none in the last 6 months" (02/27/2015)   Nocturia    Pneumonia "several times"   PONV (postoperative nausea and vomiting)    Von Willebrand disease (HCC)    Dr. Cyndie Chime    PAST SURGICAL HISTORY: Past Surgical History:  Procedure Laterality Date   BACK SURGERY     BREAST CYST  EXCISION Left 02/20/2018   Procedure: EXCISION OF LEFT BREAST SEBACEOUS CYST;  Surgeon: Emelia Loron, MD;  Location: [facility];  Service: General;  Laterality: Left;   BREAST REDUCTION SURGERY Bilateral 02/27/2015   Procedure: BILATERAL BREAST REDUCTION WITH FREE NIPPLE GRAFT TECHNIQUE ON RIGHT BREAST;  Surgeon: Etter Sjogren, MD;  Location: [facility] OR;  Service: Plastics;  Laterality: Bilateral;   CESAREAN SECTION  2004; 2013   CHOLECYSTECTOMY  03/02/2012   Procedure: LAPAROSCOPIC CHOLECYSTECTOMY WITH INTRAOPERATIVE CHOLANGIOGRAM;  Surgeon: Currie Paris, MD;  Location: [facility] OR;  Service: General;  Laterality: N/A;   DILATION AND CURETTAGE OF UTERUS  ~ 1997; ~ 2005   DILITATION & CURRETTAGE/HYSTROSCOPY WITH THERMACHOICE ABLATION  04/28/2012   Procedure: DILATATION & CURETTAGE/HYSTEROSCOPY WITH THERMACHOICE ABLATION;  Surgeon: Jeani Hawking, MD;  Location: [facility];  Service: Gynecology;  Laterality: N/A;   ERCP  03/03/2012   Procedure: ENDOSCOPIC RETROGRADE CHOLANGIOPANCREATOGRAPHY (ERCP);  Surgeon: Theda Belfast, MD;  Location: [facility];  Service: Endoscopy;  Laterality: N/A;  dl/hung   LAPAROSCOPY  1610    LUMBAR DISC SURGERY  2003; 2008; 2009   MULTIPLE TOOTH EXTRACTIONS  "scattered dates"   POSTERIOR LUMBAR FUSION  2010   REDUCTION MAMMAPLASTY Bilateral 02/27/2015   TUBAL LIGATION  2013   WISDOM TOOTH EXTRACTION  2001    FAMILY HISTORY: Family History  Problem Relation Age of Onset   Stroke Mother    Arthritis Mother    Depression Mother    COPD Father    Kidney disease Paternal Uncle    Stroke Maternal Grandfather    Heart disease Paternal Grandfather    Multiple sclerosis Neg Hx     SOCIAL HISTORY:  Social History   Socioeconomic History   Marital status: Married    Spouse name: Not on file   Number of children: Not on file   Years of education: Not on file   Highest education level: Not on file  Occupational History   Occupation: regulatory affairs  Tobacco Use   Smoking status: Former Smoker    Packs/day: 0.25    Years: 2.00    Pack years: 0.50    Types: Cigarettes    Quit date: 2000    Years since quitting: 21.7   Smokeless tobacco: Never Used  Substance and Sexual Activity   Alcohol use: Yes    Alcohol/week: 0.0 standard drinks    Comment: social   Drug use: Yes    Types: Marijuana    Comment: occasional use, last use last week   Sexual activity: Yes    Birth control/protection: None    Comment: BTL  Other Topics Concern   Not on file  Social History Narrative   Lives with husband and kids   Caffeine use:    16 oz per day   Right handed   Social Determinants of Health   Financial Resource Strain:    Difficulty of Paying Living Expenses: Not on file  Food Insecurity:    Worried About Programme researcher, broadcasting/film/video in the Last Year: Not on file   [employer] of Food in the Last Year: Not on file  Transportation Needs:    Lack of Transportation (Medical): Not on file   Lack of Transportation (Non-Medical): Not on file  Physical Activity:    Days of Exercise per Week: Not on file   Minutes of Exercise per Session: Not on file  Stress:    Feeling of Stress  : Not on file  Social Connections:    Frequency of Communication with Friends and Family: Not on file   Frequency of Social Gatherings with Friends and Family: Not on file   Attends Religious Services: Not on Scientist, clinical (histocompatibility and immunogenetics) or Organizations: Not on file   Attends Banker Meetings: Not on file   Marital Status: Not on file  Intimate Partner Violence:    Fear of Current or Ex-Partner: Not on file   Emotionally Abused: Not on file   Physically Abused: Not on file   Sexually Abused: Not on file     PHYSICAL EXAM  Vitals:   01/17/20 1313  BP: (!) 143/80  Pulse: 84  Weight: 250 lb (113.4 kg)  Height: 5\' 3"  (1.6 m)    Body mass index is 44.29 kg/m.   General: The patient is well-developed and well-nourished and in no acute distress  HEENT:  Head is [place]/AT.  Sclera are anicteric.    Neck: No carotid bruits are noted.  The neck is nontender.  Cardiovascular: The heart has a regular rate and rhythm with a normal S1 and S2. There were no murmurs, gallops or rubs.    Skin: Extremities are without rash or  edema.  Musculoskeletal:  Joints are tender in hands  Neurologic Exam  Mental status: The patient is alert and oriented x 3 at the time of the examination. The patient has apparent normal recent and remote memory, with an apparently normal attention span and concentration ability.   Speech is normal.  Cranial nerves: Extraocular movements are full. Pupils are equal, round, and reactive to light and accomodation. Mildly reduced color vision OD.  Facial symmetry is present. There is good facial sensation to soft touch bilaterally.Facial strength is normal.  Trapezius and sternocleidomastoid strength is normal. No dysarthria is noted.   No obvious hearing deficits are noted.  Motor:  Muscle bulk is normal.   Tone is normal. Strength is  5 / 5 in all 4 extremities.   Sensory: Sensory testing is intact to pinprick, soft touch and vibration sensation in all  4 extremities.  Coordination: Cerebellar testing reveals good finger-nose-finger and heel-to-shin bilaterally.  Gait and station: Station is normal.   Gait is normal. Tandem gait is mildly wide. Romberg is negative.   Reflexes: Deep tendon reflexes are symmetric and normal bilaterally.      DIAGNOSTIC DATA (LABS, IMAGING, TESTING) - I reviewed patient records, labs, notes, testing and imaging myself where available.  Lab Results  Component Value Date   WBC 7.9 01/16/2019   HGB 12.4 01/16/2019   HCT 38.5 01/16/2019   MCV 96.7 01/16/2019   PLT 319 01/16/2019      Component Value Date/Time   NA 140 01/16/2019 1137   NA 150 (H) 01/01/2019 1635   K 4.0 01/16/2019 1137   CL 106 01/16/2019 1137   CO2 26 01/16/2019 1137   GLUCOSE 92 01/16/2019 1137   BUN 8 01/16/2019 1137   BUN 7 01/01/2019 1635   CREATININE 0.85 01/16/2019 1137   CREATININE 0.59 09/07/2011 1801   CALCIUM 9.5 01/16/2019 1137   PROT 7.6 01/16/2019 1137   PROT 7.6 01/01/2019 1635   ALBUMIN 4.4 01/16/2019 1137   ALBUMIN 4.5 01/01/2019 1635   AST 15 01/16/2019 1137   ALT 14 01/16/2019 1137   ALKPHOS 59 01/16/2019 1137   BILITOT 0.4 01/16/2019 1137   GFRNONAA >60 01/16/2019 1137   GFRAA >60 01/16/2019 1137   Lab Results  Component Value  Date   CHOL 185 12/14/2018   HDL 52 12/14/2018   LDLCALC 122 (H) 12/14/2018   TRIG 56 12/14/2018   CHOLHDL 3.6 12/14/2018   Lab Results  Component Value Date   HGBA1C 5.1 12/14/2018   Lab Results  Component Value Date   VITAMINB12 331 01/16/2019   Lab Results  Component Value Date   TSH 0.979 12/13/2018       ASSESSMENT AND PLAN  1. Multiple sclerosis (HCC)   2. Pain in joint, multiple sites   3. Other fatigue   4. Gait disturbance   5. Attention deficit disorder (ADD) in adult   6. Chronic uveitis, right     1.  We discussed options.  She would like to switch from Ocrevu.s due to more fatigue the last 2 months of each cycle.   We discussed Briumvi and  Kesimpta as same MOA, benefits, ris but she may do differently for the 'crap gap'.  I will check lab work. 2.   Rapaflo for her urinary hesitancy.  I will avoid tamsulosin as she had a sulfa allergy 3.   She will return to see [place] in 6 months or sooner if there are new or worsening neurologic symptoms.  Aliyah Abeyta A. Epimenio Foot, MD, PhD, FAAN Certified in Neurology, Clinical Neurophysiology, Sleep Medicine, Pain Medicine and Neuroimaging Director, Multiple Sclerosis Center at [facility]  [facility] [address] [place], [place] [address] [phone]

## 2022-09-29 LAB — QUANTIFERON-TB GOLD PLUS
QuantiFERON Mitogen Value: >10 IU/mL
QuantiFERON Nil Value: 0.18 IU/mL
QuantiFERON TB1 Ag Value: 0.17 IU/mL
QuantiFERON TB2 Ag Value: 0.15 IU/mL
QuantiFERON-TB Gold Plus: NEGATIVE

## 2022-09-29 LAB — HIV ANTIBODY (ROUTINE TESTING W REFLEX): HIV Screen 4th Generation wRfx: NONREACTIVE

## 2022-09-29 LAB — HEPATITIS B CORE ANTIBODY, TOTAL: Hep B Core Total Ab: NEGATIVE

## 2022-09-29 LAB — HEPATITIS B SURFACE ANTIGEN: Hepatitis B Surface Ag: NEGATIVE

## 2022-10-06 ENCOUNTER — Telehealth: Payer: Self-pay | Admitting: *Deleted

## 2022-10-06 NOTE — Telephone Encounter (Signed)
-----   Message from Asa Lente, MD sent at 09/30/2022  8:07 AM EDT ----- Lab work is fine.  She would like to go on Briumvi (if we have trouble getting this she is okay with Kesimpta).  I think she signed both forms

## 2022-10-06 NOTE — Telephone Encounter (Signed)
Called pt at [phone]. Relayed results per Dr. Bonnita Hollow note. She verbalized understanding. Aware we will fax in Briumvi start form. Once infusion suite has this approved, they will call to get her scheduled.

## 2022-10-07 ENCOUNTER — Other Ambulatory Visit: Payer: Self-pay | Admitting: *Deleted

## 2022-10-07 DIAGNOSIS — G35 Multiple sclerosis: Secondary | ICD-10-CM

## 2022-10-07 DIAGNOSIS — Z79899 Other long term (current) drug therapy: Secondary | ICD-10-CM

## 2022-10-07 NOTE — Telephone Encounter (Signed)
Faxed completed/signed Briumvi start form to Briumvi Patient Support at [phone]. Received fax confirmation.  Gave completed/signed orders to intrafusion.

## 2022-10-11 ENCOUNTER — Other Ambulatory Visit (INDEPENDENT_AMBULATORY_CARE_PROVIDER_SITE_OTHER): Payer: Self-pay

## 2022-10-11 DIAGNOSIS — Z0289 Encounter for other administrative examinations: Secondary | ICD-10-CM

## 2022-10-11 DIAGNOSIS — Z79899 Other long term (current) drug therapy: Secondary | ICD-10-CM

## 2022-10-11 DIAGNOSIS — G35 Multiple sclerosis: Secondary | ICD-10-CM

## 2022-10-12 LAB — IGG, IGA, IGM
IgA/Immunoglobulin A, Serum: 316 mg/dL (ref 87–352)
IgG (Immunoglobin G), Serum: 1079 mg/dL (ref 586–1602)
IgM (Immunoglobulin M), Srm: 54 mg/dL (ref 26–217)

## 2022-10-21 ENCOUNTER — Other Ambulatory Visit (HOSPITAL_COMMUNITY): Payer: Self-pay

## 2022-10-21 MED ORDER — LISDEXAMFETAMINE DIMESYLATE 50 MG PO CAPS
50.0000 mg | ORAL_CAPSULE | Freq: Every morning | ORAL | 0 refills | Status: DC
  Filled 2022-10-21 (×2): qty 30, 30d supply, fill #0

## 2022-11-04 ENCOUNTER — Ambulatory Visit
Admission: RE | Admit: 2022-11-04 | Discharge: 2022-11-04 | Disposition: A | Discharge status: Home / Self Care | Payer: No Typology Code available for payment source | Source: Ambulatory Visit | Attending: Neurology | Admitting: Neurology

## 2022-11-04 DIAGNOSIS — G35 Multiple sclerosis: Secondary | ICD-10-CM

## 2022-11-04 MED ORDER — GADOTERIDOL 279.3 MG/ML IV SOLN
19.0000 mL | Freq: Once | INTRAVENOUS | Status: AC | PRN
  Administered 2022-11-04: 19 mL via INTRAVENOUS

## 2022-11-08 ENCOUNTER — Telehealth: Payer: Self-pay | Admitting: Neurology

## 2022-11-08 NOTE — Telephone Encounter (Signed)
Ann [name] was seen today for a research visit for the CHIMES multiple sclerosis study.  She opted to switch from Ocrevus to [facility] and had her first infusion last week.  She had a mild headache and minimal itching that day only.  She felt very well the next day.       She feels her MS has been stable.  She has tolerated the study medication well.  She has no new neurologic symptoms.     She feels fatigue is much better.   She has depression.  Notes no cognitive issues.  Bladder is doing well.     She can walk 3+ mil;es and keeps up with friends.     EDSS today was 2.0 (vision 2, cerebellar 1,  all others 0).   She did labwork and scales

## 2022-11-22 ENCOUNTER — Encounter: Payer: Self-pay | Admitting: Neurology

## 2022-11-24 NOTE — Telephone Encounter (Signed)
I called pt she said that after her first Briumvi had about 10 days feeling malaise.  She is feeling better to day.   Made appt per Dr. Epimenio Foot ok for 11-30-2022 at 0900 for follow up.  Pt appreciated call back.

## 2022-11-28 NOTE — Progress Notes (Unsigned)
[facility]  PATIENT: Ann [name] DOB: Jentry 30, 1982  REFERRING DOCTOR OR PCP: Courtney Paris (PCP); Ritta Slot (neuro hospitalist) SOURCE: Patient, notes from recent hospital admission, lab reports, imaging reports, MRI images personally reviewed  _________________________________   HISTORICAL  CHIEF COMPLAINT:  No chief complaint on file.   HISTORY OF PRESENT ILLNESS:  Ann [name] is a 42 y.o. woman with relapsing remitting MS diagnosed 12/2018.     Update 09/27/2022 She is on Ocrevus and has tolerated it well.   Her last infusion was 05/28/2022.  MRI has been stable and no exaceration.  She was receiving the Ocrevus through the [facility]ES study.  She completed her last treatment visit but will receive 1 more MRI in June or July as part of the safety follow-up.  She feels her gait is fairly stable.   Sometimes balance is mildly off   She has only fall.  She uses the bannister on stairs.   She can easily walk a mile and keeps up with others.   She has some tingling in hands though exam has been normal.   Denies weakness or spasticity.  She sometimes has hesitancy and double void.  She has had one UTI this year.    Vision is stable.    She denies depression.   Fatigue bothers her more this year.  She has more stress and feels she does slightly worse neurologically when more stressed.   She sleeps well with tizanidine  She is on Vyvanse with benefit for attention and fatigue.  She has von Willebrand's disease but does not require treatment,   She was diagnosed in 2005.  In 2004, she had bleeding postpartum and needed medical treatment with DDAVP.   She also was given prophylactic DDAVP for gallbladder surgery and breast reduction surgery.   She has never had an issue with IV's or blood draws.    MS History: She had the onset of slurred speech 12/10/2018.   When symptoms persisted, she went to the ED a few days later.   Her Ct scan was normal.   The  MRi was consistent with MS.    One focus enhanced helping to confirm the diagnosis.   MRI cervical spine did not show any MS plaque according to the official interpretation.  However, by my interpretation there are 2 foci within the spinal cord, one medially slightly to the left adjacent to C3 and another focus adjacent to T3-T4 noted on the sagittal images but not covered on the axial views..    She had 5 days of IV Solumedrol and her speech was much better by the time she was discharged.       She actually had noted some clumsiness with her gait in early July 2020.  She still feels a little clumsy though she is better than she was last month.  Of note, she has had dysesthesias in the chest since this event.  Also, she had right visual blurriness that started 5-6 years ago.   She doesn't recall an actual diagnosis but she did have intra-ocular steroid injections.   Imaging The MRI of the brain 12/12/2020 shows multiple T2/flair hyperintense foci in the periventricular, juxtacortical deep white matter consistent with demyelinating plaque.  One focus in the left deep/periventricular white matter enhanced after contrast consistent with an acute demyelinating plaque.  This plaque was also hyperintense on diffusion-weighted images.  The brainstem was fine.  MRI of the cervical spine 12/13/2018 shows 2 plaques, one small focus  ventromedially towards the left adjacent to C3 and another focus adjacent to T3-T4 (not covered on the axial views)  MRI brain 12/03/2021 showed no new MS lesions.   MRI Brain 11/04/2022 showed T2/FLAIR hyperintense foci in the cerebral hemispheres in a pattern consistent with chronic demyelinating plaque associated with multiple sclerosis. None of the foci enhanced or appear to be acute. Compared to the MRI from 12/03/2021, there are no new lesions.   REVIEW OF SYSTEMS: Constitutional: No fevers, chills, sweats, or change in appetite.  She has fatigue. Eyes: No visual changes, double  vision, eye pain Ear, nose and throat: No hearing loss, ear pain, nasal congestion, sore throat Cardiovascular: No chest pain, palpitations Respiratory:  No shortness of breath at rest or with exertion.   No wheezes GastrointestinaI: No nausea, vomiting, diarrhea, abdominal pain, fecal incontinence Genitourinary:  No dysuria, urinary retention or frequency.  No nocturia. Musculoskeletal:  multiple joint pain Integumentary: No rash, pruritus, skin lesions Neurological: as above Psychiatric: No depression at this time.  No anxiety Endocrine: No palpitations, diaphoresis, change in appetite, change in weigh or increased thirst Hematologic/Lymphatic:  No anemia, purpura, petechiae.  She has von Willebrand's disease not requiring any treatment Allergic/Immunologic: No itchy/runny eyes, nasal congestion, recent allergic reactions, rashes  ALLERGIES: Allergies  Allergen Reactions   Compazine [Prochlorperazine Edisylate]     Jaws locked up per pt   Darvocet [Propoxyphene N-Acetaminophen] Nausea And Vomiting   Codeine Nausea And Vomiting and Rash   Sulfa Antibiotics Rash    HOME MEDICATIONS:  Current Outpatient Medications:    albuterol (VENTOLIN HFA) 108 (90 Base) MCG/ACT inhaler, Inhale 1-2 puffs into the lungs every 6 (six) hours as needed for wheezing or shortness of breath., Disp: 1 each, Rfl: 0   ALPRAZolam (XANAX) 0.5 MG tablet, Take 0.5 mg by mouth 2 (two) times daily as needed for anxiety. , Disp: , Rfl:    amoxicillin-clavulanate (AUGMENTIN) 875-125 MG tablet, Take 1 tablet by mouth 2 (two) times daily. (Patient not taking: Reported on 09/27/2022), Disp: 20 tablet, Rfl: 0   baclofen (LIORESAL) 10 MG tablet, Take 0.5-1 tablets (5-10 mg total) by mouth 3 (three) times daily. 1/2-1 tablet by mouth three times daily (Patient not taking: Reported on 09/27/2022), Disp: 90 tablet, Rfl: 0   FLUoxetine (PROZAC) 20 MG capsule, Take 60 mg by mouth at bedtime. (Patient not taking: Reported on  09/27/2022), Disp: , Rfl:    fluticasone (FLONASE) 50 MCG/ACT nasal spray, Place 2 sprays into both nostrils daily. (Patient not taking: Reported on 09/27/2022), Disp: 16 g, Rfl: 0   HYDROcodone-acetaminophen (NORCO/VICODIN) 5-325 MG tablet, Take 1-2 tablets by mouth every 4 (four) hours as needed. (Patient not taking: Reported on 09/27/2022), Disp: 12 tablet, Rfl: 0   hydrOXYzine (ATARAX/VISTARIL) 10 MG tablet, Take 1 tablet (10 mg total) by mouth 3 (three) times daily as needed. (Patient not taking: Reported on 09/27/2022), Disp: 90 tablet, Rfl: 5   ibuprofen (ADVIL) 600 MG tablet, Take 1 tablet (600 mg total) by mouth every 6 (six) hours as needed., Disp: 30 tablet, Rfl: 0   methylphenidate (RITALIN) 10 MG tablet, Take 1 tablet (10 mg total) by mouth 2 (two) times daily with breakfast and lunch. (Patient not taking: Reported on 09/27/2022), Disp: 60 tablet, Rfl: 0   Multiple Vitamins-Minerals (BARIATRIC MULTIVITAMINS/IRON PO), Take 1 tablet by mouth daily., Disp: , Rfl:    MYFEMBREE 40-1-0.5 MG TABS, Take 1 tablet by mouth daily. (Patient not taking: Reported on 09/27/2022), Disp: , Rfl:  ocrelizumab (OCREVUS) 300 MG/10ML injection, Inject 600 mg into the vein every 6 (six) months., Disp: , Rfl:    omeprazole (PRILOSEC) 20 MG capsule, Take 20 mg by mouth daily as needed (acid reflux). (Patient not taking: Reported on 09/27/2022), Disp: , Rfl:    OVER THE COUNTER MEDICATION, Place 1 patch onto the skin daily. Vitamin D Patch (Patient not taking: Reported on 09/27/2022), Disp: , Rfl:    phentermine (ADIPEX-P) 37.5 MG tablet, TAKE 1/2 TABLET EVERY MORNING AND ONE-HALF TABLET in early afternoon (Patient not taking: Reported on 09/27/2022), Disp: , Rfl:    silodosin (RAPAFLO) 4 MG CAPS capsule, Take 1 capsule (4 mg total) by mouth daily with breakfast., Disp: 30 capsule, Rfl: 11   Spacer/Aero-Holding Chambers (AEROCHAMBER MV) inhaler, Use as instructed, Disp: 1 each, Rfl: 1   tiZANidine (ZANAFLEX) 4 MG tablet,  Take 1 tablet (4 mg total) by mouth every 8 (eight) hours as needed for muscle spasms., Disp: 90 tablet, Rfl: 3   topiramate (TOPAMAX) 100 MG tablet, Take 100 mg by mouth daily. (Patient not taking: Reported on 09/27/2022), Disp: , Rfl:   PAST MEDICAL HISTORY: Past Medical History:  Diagnosis Date   ADD (attention deficit disorder)    takes Adderall daily   Anemia    Anxiety    takes Xanax daily as needed   Asthma    mild case per pt   Chronic lower back pain    Depression    has meds prescribed but doesn't take them   Family history of adverse reaction to anesthesia    "Mom gets PONV too"   Headache    "monthly" (02/27/2015)   Hemorrhoids    History of esophagogastroduodenoscopy (EGD)    Migraine    "none in the last 6 months" (02/27/2015)   Multiple sclerosis (HCC)    Nocturia    Pneumonia "several times"   PONV (postoperative nausea and vomiting)    Von Willebrand disease (HCC)    Dr. Cyndie Chime    PAST SURGICAL HISTORY: Past Surgical History:  Procedure Laterality Date   BACK SURGERY     BREAST CYST EXCISION Left 02/20/2018   Procedure: EXCISION OF LEFT BREAST SEBACEOUS CYST;  Surgeon: Emelia Loron, MD;  Location: [facility];  Service: General;  Laterality: Left;   BREAST REDUCTION SURGERY Bilateral 02/27/2015   Procedure: BILATERAL BREAST REDUCTION WITH FREE NIPPLE GRAFT TECHNIQUE ON RIGHT BREAST;  Surgeon: Etter Sjogren, MD;  Location: [facility] OR;  Service: Plastics;  Laterality: Bilateral;   CESAREAN SECTION  2004; 2013   CHOLECYSTECTOMY  03/02/2012   Procedure: LAPAROSCOPIC CHOLECYSTECTOMY WITH INTRAOPERATIVE CHOLANGIOGRAM;  Surgeon: Currie Paris, MD;  Location: [facility] OR;  Service: General;  Laterality: N/A;   DILATION AND CURETTAGE OF UTERUS  ~ 1997; ~ 2005   DILITATION & CURRETTAGE/HYSTROSCOPY WITH THERMACHOICE ABLATION  04/28/2012   Procedure: DILATATION & CURETTAGE/HYSTEROSCOPY WITH THERMACHOICE ABLATION;  Surgeon: Jeani Hawking, MD;   Location: [facility]S;  Service: Gynecology;  Laterality: N/A;   ERCP  03/03/2012   Procedure: ENDOSCOPIC RETROGRADE CHOLANGIOPANCREATOGRAPHY (ERCP);  Surgeon: Theda Belfast, MD;  Location: [facility] END[facility]COPY;  Service: Endoscopy;  Laterality: N/A;  dl/hung   LAPAROSCOPY  1610   LUMBAR DISC SURGERY  2003; 2008; 2009   MULTIPLE TOOTH EXTRACTIONS  "scattered dates"   POSTERIOR LUMBAR FUSION  2010   REDUCTION MAMMAPLASTY Bilateral 02/27/2015   TUBAL LIGATION  2013   WISDOM TOOTH EXTRACTION  2001    FAMILY HISTORY: Family History  Problem  Relation Age of Onset   Stroke Mother    Arthritis Mother    Depression Mother    COPD Father    Kidney disease Paternal Uncle    Stroke Maternal Grandfather    Heart disease Paternal Grandfather    Multiple sclerosis Neg Hx     SOCIAL HISTORY:  Social History   Socioeconomic History   Marital status: Married    Spouse name: Not on file   Number of children: Not on file   Years of education: Not on file   Highest education level: Not on file  Occupational History   Occupation: regulatory affairs  Tobacco Use   Smoking status: Former    Current packs/day: 0.00    Average packs/day: 0.3 packs/day for 2.0 years (0.5 ttl pk-yrs)    Types: Cigarettes    Start date: 43    Quit date: 2000    Years since quitting: 24.5   Smokeless tobacco: Never  Substance and Sexual Activity   Alcohol use: Yes    Alcohol/week: 0.0 standard drinks of alcohol    Comment: social   Drug use: Yes    Types: Marijuana    Comment: occasional use, last use last week   Sexual activity: Yes    Birth control/protection: None    Comment: BTL  Other Topics Concern   Not on file  Social History Narrative   Lives with husband and kids   Caffeine use:    16 oz per day   Right handed   Social Determinants of Health   Financial Resource Strain: Not on file  Food Insecurity: Low Risk  (11/01/2022)   [facility]   Food vital sign    Within  the past 12 months, you worried that your food would run out before you got money to buy more: Never true    Within the past 12 months, the food you bought just didn't last and you didn't have money to get more. : Never true  Transportation Needs: Not on file (11/01/2022)  Physical Activity: Not on file  Stress: Not on file  Social Connections: Not on file  Intimate Partner Violence: Not on file     PHYSICAL EXAM  There were no vitals filed for this visit.   There is no height or weight on file to calculate BMI.   General: The patient is well-developed and well-nourished and in no acute distress  HEENT:  Head is [place]/AT.  Sclera are anicteric.    Neck: No carotid bruits are noted.  The neck is nontender.  Cardiovascular: The heart has a regular rate and rhythm with a normal S1 and S2. There were no murmurs, gallops or rubs.    Skin: Extremities are without rash or  edema.  Musculoskeletal:  Joints are tender in hands  Neurologic Exam  Mental status: The patient is alert and oriented x 3 at the time of the examination. The patient has apparent normal recent and remote memory, with an apparently normal attention span and concentration ability.   Speech is normal.  Cranial nerves: Extraocular movements are full. Pupils are equal, round, and reactive to light and accomodation. Mildly reduced color vision OD.  Facial symmetry is present. There is good facial sensation to soft touch bilaterally.Facial strength is normal.  Trapezius and sternocleidomastoid strength is normal. No dysarthria is noted.   No obvious hearing deficits are noted.  Motor:  Muscle bulk is normal.   Tone is normal. Strength is  5 / 5 in all 4 extremities.   Sensory: Sensory testing is intact to pinprick, soft touch and vibration sensation in all 4 extremities.  Coordination: Cerebellar testing reveals good finger-nose-finger and heel-to-shin bilaterally.  Gait and station: Station is normal.   Gait is normal.  Tandem gait is mildly wide. Romberg is negative.   Reflexes: Deep tendon reflexes are symmetric and normal bilaterally.      DIAGNOSTIC DATA (LABS, IMAGING, TESTING) - I reviewed patient records, labs, notes, testing and imaging myself where available.  Lab Results  Component Value Date   WBC 10.0 03/25/2020   HGB 12.5 03/25/2020   HCT 38.8 03/25/2020   MCV 95.6 03/25/2020   PLT 351 03/25/2020      Component Value Date/Time   NA 140 03/25/2020 1135   NA 150 (H) 01/01/2019 1635   K 4.1 03/25/2020 1135   CL 106 03/25/2020 1135   CO2 26 03/25/2020 1135   GLUCOSE 98 03/25/2020 1135   BUN 8 03/25/2020 1135   BUN 7 01/01/2019 1635   CREATININE 0.78 03/25/2020 1135   CREATININE 0.59 09/07/2011 1801   CALCIUM 9.3 03/25/2020 1135   PROT 7.7 03/25/2020 1135   PROT 7.6 01/01/2019 1635   ALBUMIN 4.1 03/25/2020 1135   ALBUMIN 4.5 01/01/2019 1635   AST 15 03/25/2020 1135   ALT 12 03/25/2020 1135   ALKPHOS 78 03/25/2020 1135   BILITOT 0.5 03/25/2020 1135   GFRNONAA >60 03/25/2020 1135   GFRAA >60 01/16/2019 1137   Lab Results  Component Value Date   CHOL 185 12/14/2018   HDL 52 12/14/2018   LDLCALC 122 (H) 12/14/2018   TRIG 56 12/14/2018   CHOLHDL 3.6 12/14/2018   Lab Results  Component Value Date   HGBA1C 5.1 12/14/2018   Lab Results  Component Value Date   VITAMINB12 331 01/16/2019   Lab Results  Component Value Date   TSH 0.979 12/13/2018       ASSESSMENT AND PLAN  No diagnosis found.   1.  We discussed options.  She would like to switch from Ocrevu.s due to more fatigue the last 2 months of each cycle.   We discussed Briumvi and Kesimpta as same MOA, benefits, ris but she may do differently for the 'crap gap'.  I will check lab work. 2.   Rapaflo for her urinary hesitancy.  I will avoid tamsulosin as she had a sulfa allergy 3.   She will return to see [place] in 6 months or sooner if there are new or worsening neurologic symptoms.  Van Seymore A. Epimenio Foot, MD, PhD,  FAAN Certified in Neurology, Clinical Neurophysiology, Sleep Medicine, Pain Medicine and Neuroimaging Director, [facility] at [facility]  [facility] [address], [address] [place], [place] [address] [phone]

## 2022-11-30 ENCOUNTER — Encounter: Payer: Self-pay | Admitting: Neurology

## 2022-11-30 ENCOUNTER — Ambulatory Visit (INDEPENDENT_AMBULATORY_CARE_PROVIDER_SITE_OTHER): Payer: 59 | Admitting: Neurology

## 2022-11-30 VITALS — BP 129/84 | HR 94 | Ht 62.5 in | Wt 203.0 lb

## 2022-11-30 DIAGNOSIS — F419 Anxiety disorder, unspecified: Secondary | ICD-10-CM | POA: Diagnosis not present

## 2022-11-30 DIAGNOSIS — Z79899 Other long term (current) drug therapy: Secondary | ICD-10-CM

## 2022-11-30 DIAGNOSIS — R269 Unspecified abnormalities of gait and mobility: Secondary | ICD-10-CM

## 2022-11-30 DIAGNOSIS — R5383 Other fatigue: Secondary | ICD-10-CM

## 2022-11-30 DIAGNOSIS — G35 Multiple sclerosis: Secondary | ICD-10-CM | POA: Diagnosis not present

## 2023-03-31 DIAGNOSIS — I10 Essential (primary) hypertension: Secondary | ICD-10-CM | POA: Insufficient documentation

## 2023-04-04 ENCOUNTER — Encounter: Payer: Self-pay | Admitting: Neurology

## 2023-04-04 ENCOUNTER — Ambulatory Visit (INDEPENDENT_AMBULATORY_CARE_PROVIDER_SITE_OTHER): Payer: 59 | Admitting: Neurology

## 2023-04-04 VITALS — BP 153/98 | HR 85 | Ht 62.5 in | Wt 201.5 lb

## 2023-04-04 DIAGNOSIS — R269 Unspecified abnormalities of gait and mobility: Secondary | ICD-10-CM | POA: Diagnosis not present

## 2023-04-04 DIAGNOSIS — Z79899 Other long term (current) drug therapy: Secondary | ICD-10-CM | POA: Diagnosis not present

## 2023-04-04 DIAGNOSIS — R5383 Other fatigue: Secondary | ICD-10-CM

## 2023-04-04 DIAGNOSIS — F988 Other specified behavioral and emotional disorders with onset usually occurring in childhood and adolescence: Secondary | ICD-10-CM | POA: Insufficient documentation

## 2023-04-04 DIAGNOSIS — F419 Anxiety disorder, unspecified: Secondary | ICD-10-CM

## 2023-04-04 DIAGNOSIS — G35 Multiple sclerosis: Secondary | ICD-10-CM

## 2023-04-04 MED ORDER — LISDEXAMFETAMINE DIMESYLATE 60 MG PO CAPS: 60.0000 mg | ORAL_CAPSULE | Freq: Every morning | ORAL | 0 refills | Status: DC

## 2023-04-04 MED ORDER — TIZANIDINE HCL 4 MG PO TABS: 4.0000 mg | ORAL_TABLET | Freq: Three times a day (TID) | ORAL | 3 refills | Status: DC | PRN

## 2023-04-04 NOTE — Progress Notes (Signed)
[facility]  PATIENT: Ann [name] DOB: 10-01-1980  REFERRING DOCTOR OR PCP: Courtney Paris (PCP); Ritta Slot (neuro hospitalist) SOURCE: Patient, notes from recent hospital admission, lab reports, imaging reports, MRI images personally reviewed  _________________________________   HISTORICAL  CHIEF COMPLAINT:  Chief Complaint  Patient presents with   Follow-up    Pt in room 10, boyfriend in room. Here for MS follow up DMT: Briumvi. Pt reports doing well. No concerns. Patient blood pressure elevated x 2. Was started on blood pressure medication, pt has not picked up Rx up yet.     HISTORY OF PRESENT ILLNESS:  Ann [name] is a 42 y.o. woman with relapsing remitting MS diagnosed 12/2018.     Update 04/03/2023 She is on Briumvi infusion and tolerates it well.   She feels good - next infusion is 04/14/2023.   Before Briumvi, she was on Ocrevus and tolerated it okay though did have some mild infusion reactions.  She also felt a little worse the last month of each cycle.  Due to that, she opted to go on Briumvi instead of Ocrevus.   Her last Ocrevus infusion was 05/28/2022.  MRI has been stable and no exaceration.  Her last brain MRI was 11/04/2022 showing no new lesions.  She feels her gait is fairly stable.   Sometimes balance is mildly off   She has one fall 2-3 months ago.  She uses the bannister on stairs.   She can easily walk a mile and keeps up with others.   Parestheias have improved  Denies weakness or spasticity.  Bladder is doing better with less hesitancy.    Vision is stable.    She reports depression and is on fluoxetine.   More stress going through a divorce,  Fatigue bothers her more this year.  She has more stress and feels she does slightly worse neurologically when more stressed.   She was sleeping well with tizanidine but with more stress doing worse.  Therefore, she also takes a xanax many nights  She is on Vyvanse with  benefit for attention and fatigue.  MS History: She had the onset of slurred speech 12/10/2018.   When symptoms persisted, she went to the ED a few days later.   Her Ct scan was normal.   The MRi was consistent with MS.    One focus enhanced helping to confirm the diagnosis.   MRI cervical spine did not show any MS plaque according to the official interpretation.  However, by my interpretation there are 2 foci within the spinal cord, one medially slightly to the left adjacent to C3 and another focus adjacent to T3-T4 noted on the sagittal images but not covered on the axial views..    She had 5 days of IV Solumedrol and her speech was much better by the time she was discharged.       She actually had noted some clumsiness with her gait in early July 2020.  She still feels a little clumsy though she is better than she was last month.  Of note, she has had dysesthesias in the chest since this event.  Also, she had right visual blurriness that started 5-6 years ago.   She doesn't recall an actual diagnosis but she did have intra-ocular steroid injections.   Imaging The MRI of the brain 12/12/2020 shows multiple T2/flair hyperintense foci in the periventricular, juxtacortical deep white matter consistent with demyelinating plaque.  One focus in the left deep/periventricular white matter enhanced  after contrast consistent with an acute demyelinating plaque.  This plaque was also hyperintense on diffusion-weighted images.  The brainstem was fine.  MRI of the cervical spine 12/13/2018 shows 2 plaques, one small focus ventromedially towards the left adjacent to C3 and another focus adjacent to T3-T4 (not covered on the axial views)  MRI brain 12/03/2021 showed no new MS lesions.   MRI Brain 11/04/2022 showed T2/FLAIR hyperintense foci in the cerebral hemispheres in a pattern consistent with chronic demyelinating plaque associated with multiple sclerosis. None of the foci enhanced or appear to be acute. Compared to the  MRI from 12/03/2021, there are no new lesions.   REVIEW OF SYSTEMS: Constitutional: No fevers, chills, sweats, or change in appetite.  She has fatigue. Eyes: No visual changes, double vision, eye pain Ear, nose and throat: No hearing loss, ear pain, nasal congestion, sore throat Cardiovascular: No chest pain, palpitations Respiratory:  No shortness of breath at rest or with exertion.   No wheezes GastrointestinaI: No nausea, vomiting, diarrhea, abdominal pain, fecal incontinence Genitourinary:  No dysuria, urinary retention or frequency.  No nocturia. Musculoskeletal:  multiple joint pain Integumentary: No rash, pruritus, skin lesions Neurological: as above Psychiatric: No depression at this time.  No anxiety Endocrine: No palpitations, diaphoresis, change in appetite, change in weigh or increased thirst Hematologic/Lymphatic:  No anemia, purpura, petechiae.  She has von Willebrand's disease not requiring any treatment Allergic/Immunologic: No itchy/runny eyes, nasal congestion, recent allergic reactions, rashes  ALLERGIES: Allergies  Allergen Reactions   Compazine [Prochlorperazine Edisylate]     Jaws locked up per pt   Darvocet [Propoxyphene N-Acetaminophen] Nausea And Vomiting   Codeine Nausea And Vomiting and Rash   Sulfa Antibiotics Rash    HOME MEDICATIONS:  Current Outpatient Medications:    ALPRAZolam (XANAX) 0.5 MG tablet, Take 0.5 mg by mouth 2 (two) times daily as needed for anxiety. , Disp: , Rfl:    amLODipine (NORVASC) 2.5 MG tablet, Take 1 tablet by mouth daily., Disp: , Rfl:    FLUoxetine (PROZAC) 20 MG capsule, Take 60 mg by mouth at bedtime., Disp: , Rfl:    ibuprofen (ADVIL) 600 MG tablet, Take 1 tablet (600 mg total) by mouth every 6 (six) hours as needed., Disp: 30 tablet, Rfl: 0   omeprazole (PRILOSEC) 20 MG capsule, Take 20 mg by mouth daily as needed (acid reflux)., Disp: , Rfl:    topiramate (TOPAMAX) 100 MG tablet, Take 100 mg by mouth daily., Disp: , Rfl:     tranexamic acid (LYSTEDA) 650 MG TABS tablet, Take 1,300 mg by mouth 3 (three) times daily., Disp: , Rfl:    albuterol (VENTOLIN HFA) 108 (90 Base) MCG/ACT inhaler, Inhale 1-2 puffs into the lungs every 6 (six) hours as needed for wheezing or shortness of breath. (Patient not taking: Reported on 04/04/2023), Disp: 1 each, Rfl: 0   amoxicillin-clavulanate (AUGMENTIN) 875-125 MG tablet, Take 1 tablet by mouth 2 (two) times daily. (Patient not taking: Reported on 09/27/2022), Disp: 20 tablet, Rfl: 0   baclofen (LIORESAL) 10 MG tablet, Take 0.5-1 tablets (5-10 mg total) by mouth 3 (three) times daily. 1/2-1 tablet by mouth three times daily (Patient not taking: Reported on 09/27/2022), Disp: 90 tablet, Rfl: 0   fluticasone (FLONASE) 50 MCG/ACT nasal spray, Place 2 sprays into both nostrils daily. (Patient not taking: Reported on 09/27/2022), Disp: 16 g, Rfl: 0   HYDROcodone-acetaminophen (NORCO/VICODIN) 5-325 MG tablet, Take 1-2 tablets by mouth every 4 (four) hours as needed. (Patient not taking: Reported  on 09/27/2022), Disp: 12 tablet, Rfl: 0   hydrOXYzine (ATARAX/VISTARIL) 10 MG tablet, Take 1 tablet (10 mg total) by mouth 3 (three) times daily as needed. (Patient not taking: Reported on 09/27/2022), Disp: 90 tablet, Rfl: 5   lisdexamfetamine (VYVANSE) 60 MG capsule, Take 1 capsule (60 mg total) by mouth every morning., Disp: 30 capsule, Rfl: 0   Multiple Vitamins-Minerals (BARIATRIC MULTIVITAMINS/IRON PO), Take 1 tablet by mouth daily. (Patient not taking: Reported on 11/30/2022), Disp: , Rfl:    MYFEMBREE 40-1-0.5 MG TABS, Take 1 tablet by mouth daily. (Patient not taking: Reported on 09/27/2022), Disp: , Rfl:    ocrelizumab (OCREVUS) 300 MG/10ML injection, Inject 600 mg into the vein every 6 (six) months. (Patient not taking: Reported on 04/04/2023), Disp: , Rfl:    OVER THE COUNTER MEDICATION, Place 1 patch onto the skin daily. Vitamin D Patch (Patient not taking: Reported on 09/27/2022), Disp: , Rfl:     phentermine (ADIPEX-P) 37.5 MG tablet, TAKE 1/2 TABLET EVERY MORNING AND ONE-HALF TABLET in early afternoon (Patient not taking: Reported on 09/27/2022), Disp: , Rfl:    silodosin (RAPAFLO) 4 MG CAPS capsule, Take 1 capsule (4 mg total) by mouth daily with breakfast. (Patient not taking: Reported on 11/30/2022), Disp: 30 capsule, Rfl: 11   Spacer/Aero-Holding Chambers (AEROCHAMBER MV) inhaler, Use as instructed (Patient not taking: Reported on 04/04/2023), Disp: 1 each, Rfl: 1   tiZANidine (ZANAFLEX) 4 MG tablet, Take 1 tablet (4 mg total) by mouth every 8 (eight) hours as needed for muscle spasms., Disp: 90 tablet, Rfl: 3  PAST MEDICAL HISTORY: Past Medical History:  Diagnosis Date   ADD (attention deficit disorder)    takes Adderall daily   Anemia    Anxiety    takes Xanax daily as needed   Asthma    mild case per pt   Chronic lower back pain    Depression    has meds prescribed but doesn't take them   Family history of adverse reaction to anesthesia    "Mom gets PONV too"   Headache    "monthly" (02/27/2015)   Hemorrhoids    History of esophagogastroduodenoscopy (EGD)    Migraine    "none in the last 6 months" (02/27/2015)   Multiple sclerosis (HCC)    Nocturia    Pneumonia "several times"   PONV (postoperative nausea and vomiting)    Von Willebrand disease (HCC)    Dr. Cyndie Chime    PAST SURGICAL HISTORY: Past Surgical History:  Procedure Laterality Date   BACK SURGERY     BREAST CYST EXCISION Left 02/20/2018   Procedure: EXCISION OF LEFT BREAST SEBACEOUS CYST;  Surgeon: Emelia Loron, MD;  Location: [facility];  Service: General;  Laterality: Left;   BREAST REDUCTION SURGERY Bilateral 02/27/2015   Procedure: BILATERAL BREAST REDUCTION WITH FREE NIPPLE GRAFT TECHNIQUE ON RIGHT BREAST;  Surgeon: Etter Sjogren, MD;  Location: [facility] OR;  Service: Plastics;  Laterality: Bilateral;   CESAREAN SECTION  2004; 2013   CHOLECYSTECTOMY  03/02/2012   Procedure:  LAPAROSCOPIC CHOLECYSTECTOMY WITH INTRAOPERATIVE CHOLANGIOGRAM;  Surgeon: Currie Paris, MD;  Location: [facility] OR;  Service: General;  Laterality: N/A;   DILATION AND CURETTAGE OF UTERUS  ~ 1997; ~ 2005   DILITATION & CURRETTAGE/HYSTROSCOPY WITH THERMACHOICE ABLATION  04/28/2012   Procedure: DILATATION & CURETTAGE/HYSTEROSCOPY WITH THERMACHOICE ABLATION;  Surgeon: Jeani Hawking, MD;  Location: [facility];  Service: Gynecology;  Laterality: N/A;   ERCP  03/03/2012   Procedure: ENDOSCOPIC RETROGRADE CHOLANGIOPANCREATOGRAPHY (  ERCP);  Surgeon: Theda Belfast, MD;  Location: [facility] ENDOSCOPY;  Service: Endoscopy;  Laterality: N/A;  dl/hung   LAPAROSCOPY  1610   LUMBAR DISC SURGERY  2003; 2008; 2009   MULTIPLE TOOTH EXTRACTIONS  "scattered dates"   POSTERIOR LUMBAR FUSION  2010   REDUCTION MAMMAPLASTY Bilateral 02/27/2015   TUBAL LIGATION  2013   WISDOM TOOTH EXTRACTION  2001    FAMILY HISTORY: Family History  Problem Relation Age of Onset   Stroke Mother    Arthritis Mother    Depression Mother    COPD Father    Kidney disease Paternal Uncle    Stroke Maternal Grandfather    Heart disease Paternal Grandfather    Multiple sclerosis Neg Hx     SOCIAL HISTORY:  Social History   Socioeconomic History   Marital status: Married    Spouse name: Not on file   Number of children: Not on file   Years of education: Not on file   Highest education level: Not on file  Occupational History   Occupation: regulatory affairs  Tobacco Use   Smoking status: Former    Current packs/day: 0.00    Average packs/day: 0.3 packs/day for 2.0 years (0.5 ttl pk-yrs)    Types: Cigarettes    Start date: 5    Quit date: 2000    Years since quitting: 24.9   Smokeless tobacco: Never  Substance and Sexual Activity   Alcohol use: Yes    Alcohol/week: 0.0 standard drinks of alcohol    Comment: social   Drug use: Yes    Types: Marijuana    Comment: occasional use, last use last week   Sexual activity:  Yes    Birth control/protection: None    Comment: BTL  Other Topics Concern   Not on file  Social History Narrative   Lives with husband and kids   Caffeine use:    16 oz per day   Right handed   Social Determinants of Health   Financial Resource Strain: Not on file  Food Insecurity: Low Risk  (01/05/2023)   Received from [facility]   Hunger Vital Sign    Worried About Running Out of Food in the Last Year: Never true    Ran Out of Food in the Last Year: Never true  Transportation Needs: No Transportation Needs (01/05/2023)   Received from [facility]    In the past 12 months, has lack of reliable transportation kept you from medical appointments, meetings, work or from getting things needed for daily living? : No  Physical Activity: Not on file  Stress: Not on file  Social Connections: Not on file  Intimate Partner Violence: Not on file     PHYSICAL EXAM  Vitals:   04/04/23 1105 04/04/23 1114  BP: (!) 165/103 (!) 153/98  Pulse: 85   Weight: 201 lb 8 oz (91.4 kg)   Height: 5' 2.5" (1.588 m)      Body mass index is 36.27 kg/m.   General: The patient is well-developed and well-nourished and in no acute distress  HEENT:  Head is [place]/AT.  Sclera are anicteric.     Skin: Extremities are without rash or  edema.   Neurologic Exam  Mental status: The patient is alert and oriented x 3 at the time of the examination. The patient has apparent normal recent and remote memory, with an apparently normal attention span and concentration ability.   Speech is normal.  Cranial nerves: Extraocular  movements are full. Pupils are equal, round, and reactive to light and accomodation. Mildly reduced color vision OD.  Facial symmetry is present. There is good facial sensation to soft touch bilaterally.Facial strength is normal.  Trapezius and sternocleidomastoid strength is normal. No dysarthria is noted.   No obvious hearing deficits are noted.  Motor:  Muscle bulk  is normal.   Tone is normal. Strength is  5 / 5 in all 4 extremities.   Sensory: Sensory testing is intact to pinprick, soft touch and vibration sensation in all 4 extremities.  Coordination: Cerebellar testing reveals good finger-nose-finger and heel-to-shin bilaterally.  Gait and station: Station is normal.   The gait is normal.  Tandem gait is mildly wide. Romberg is negative  Reflexes: Deep tendon reflexes are symmetric and normal bilaterally.      DIAGNOSTIC DATA (LABS, IMAGING, TESTING) - I reviewed patient records, labs, notes, testing and imaging myself where available.  Lab Results  Component Value Date   WBC 10.0 03/25/2020   HGB 12.5 03/25/2020   HCT 38.8 03/25/2020   MCV 95.6 03/25/2020   PLT 351 03/25/2020      Component Value Date/Time   NA 140 03/25/2020 1135   NA 150 (H) 01/01/2019 1635   K 4.1 03/25/2020 1135   CL 106 03/25/2020 1135   CO2 26 03/25/2020 1135   GLUCOSE 98 03/25/2020 1135   BUN 8 03/25/2020 1135   BUN 7 01/01/2019 1635   CREATININE 0.78 03/25/2020 1135   CREATININE 0.59 09/07/2011 1801   CALCIUM 9.3 03/25/2020 1135   PROT 7.7 03/25/2020 1135   PROT 7.6 01/01/2019 1635   ALBUMIN 4.1 03/25/2020 1135   ALBUMIN 4.5 01/01/2019 1635   AST 15 03/25/2020 1135   ALT 12 03/25/2020 1135   ALKPHOS 78 03/25/2020 1135   BILITOT 0.5 03/25/2020 1135   GFRNONAA >60 03/25/2020 1135   GFRAA >60 01/16/2019 1137   Lab Results  Component Value Date   CHOL 185 12/14/2018   HDL 52 12/14/2018   LDLCALC 122 (H) 12/14/2018   TRIG 56 12/14/2018   CHOLHDL 3.6 12/14/2018   Lab Results  Component Value Date   HGBA1C 5.1 12/14/2018   Lab Results  Component Value Date   VITAMINB12 331 01/16/2019   Lab Results  Component Value Date   TSH 0.979 12/13/2018       ASSESSMENT AND PLAN  1. Multiple sclerosis (HCC)   2. High risk medication use   3. Gait disturbance   4. Other fatigue   5. Anxiety   6. Attention deficit disorder, unspecified type        1.  Continue Briumvi   She is currently doing well.  She has 2 spinal cord plaques. 2.  If bladder worsens, consider re-adding Rapaflo.   3.   Vyvanse for MS related ADD.  Will increase to 60 mg. 4.   Advised to start her HTN medication.   5.  She will return to see [place] in 6 months or sooner if there are new or worsening neurologic symptoms.  Francoise Chojnowski A. Epimenio Foot, MD, PhD, FAAN Certified in Neurology, Clinical Neurophysiology, Sleep Medicine, Pain Medicine and Neuroimaging Director, [facility]  [facility] [address] [place], [place] [address] [phone]

## 2023-04-05 LAB — IGG, IGA, IGM
IgA/Immunoglobulin A, Serum: 300 mg/dL (ref 87–352)
IgG (Immunoglobin G), Serum: 999 mg/dL (ref 586–1602)
IgM (Immunoglobulin M), Srm: 51 mg/dL (ref 26–217)

## 2023-04-05 LAB — CD20 B CELLS
% CD19-B Cells: 0 % — ABNORMAL LOW (ref 4.6–22.1)
% CD20-B Cells: 0 % — ABNORMAL LOW (ref 5.0–22.3)

## 2023-05-05 ENCOUNTER — Other Ambulatory Visit: Payer: Self-pay

## 2023-05-05 ENCOUNTER — Other Ambulatory Visit: Payer: Self-pay | Admitting: Neurology

## 2023-05-05 MED ORDER — LISDEXAMFETAMINE DIMESYLATE 60 MG PO CAPS: 60.0000 mg | ORAL_CAPSULE | Freq: Every morning | ORAL | 0 refills | Status: DC

## 2023-05-05 NOTE — Telephone Encounter (Signed)
Pt last seen 04/04/2023 Upcoming Appointment 10/20/2023  Vyvanse 60 mg Last Filled 04/04/2023 Escript 05/05/2023

## 2023-06-12 ENCOUNTER — Other Ambulatory Visit: Payer: Self-pay

## 2023-06-12 ENCOUNTER — Encounter: Payer: Self-pay | Admitting: Emergency Medicine

## 2023-06-12 ENCOUNTER — Ambulatory Visit
Admission: EM | Admit: 2023-06-12 | Discharge: 2023-06-12 | Disposition: A | Discharge status: Home / Self Care | Payer: 59 | Attending: Physician Assistant | Admitting: Physician Assistant

## 2023-06-12 DIAGNOSIS — J4521 Mild intermittent asthma with (acute) exacerbation: Secondary | ICD-10-CM | POA: Diagnosis not present

## 2023-06-12 DIAGNOSIS — J069 Acute upper respiratory infection, unspecified: Secondary | ICD-10-CM

## 2023-06-12 DIAGNOSIS — Z87891 Personal history of nicotine dependence: Secondary | ICD-10-CM | POA: Insufficient documentation

## 2023-06-12 LAB — POCT INFLUENZA A/B
Influenza A, POC: NEGATIVE
Influenza B, POC: NEGATIVE

## 2023-06-12 MED ORDER — PREDNISONE 10 MG (21) PO TBPK: ORAL_TABLET | ORAL | 0 refills | Status: DC

## 2023-06-12 MED ORDER — BENZONATATE 100 MG PO CAPS: 100.0000 mg | ORAL_CAPSULE | Freq: Three times a day (TID) | ORAL | 0 refills | Status: DC

## 2023-06-12 MED ORDER — IPRATROPIUM-ALBUTEROL 0.5-2.5 (3) MG/3ML IN SOLN
3.0000 mL | Freq: Once | RESPIRATORY_TRACT | Status: AC
  Administered 2023-06-12: 3 mL via RESPIRATORY_TRACT

## 2023-06-12 MED ORDER — ALBUTEROL SULFATE HFA 108 (90 BASE) MCG/ACT IN AERS: 1.0000 | INHALATION_SPRAY | Freq: Four times a day (QID) | RESPIRATORY_TRACT | 0 refills | Status: AC | PRN

## 2023-06-12 NOTE — ED Provider Notes (Signed)
[name]    CSN: [id number] Arrival date & time: 06/12/23  0802      History   Chief Complaint Chief Complaint  Patient presents with   Cough    HPI Ann [name] is a 43 y.o. female.   Patient presents today with a 5-day history of URI symptoms that are significantly worsened in the past 2 days.  Reports that she had a mild cough and congestion but then over the past 2 days has felt like she was hit by a truck and reports widespread body aches, cough, shortness of breath, congestion.  She denies any chest pain, nausea, vomiting, diarrhea.  Reports her son was diagnosed with influenza A.  She has had COVID several years ago but not more recently.  She has had COVID-19 vaccinations but has not had influenza vaccine.  She has been taking Mucinex as well as over-the-counter cough and cold medication without improvement of symptoms.  She denies any recent antibiotics or steroids.  Reports her last steroids would be in November 2024 as a premedication for her MS infusion.  She does have a history of asthma but has not required albuterol inhaler in many years.  Denies previous hospitalization related to asthma.  She is confident that she is not pregnant.    Past Medical History:  Diagnosis Date   ADD (attention deficit disorder)    takes Adderall daily   Anemia    Anxiety    takes Xanax daily as needed   Asthma    mild case per pt   Chronic lower back pain    Depression    has meds prescribed but doesn't take them   Family history of adverse reaction to anesthesia    "Mom gets PONV too"   Headache    "monthly" (02/27/2015)   Hemorrhoids    History of esophagogastroduodenoscopy (EGD)    Migraine    "none in the last 6 months" (02/27/2015)   Multiple sclerosis (HCC)    Nocturia    Pneumonia "several times"   PONV (postoperative nausea and vomiting)    Von Willebrand disease (HCC)    Dr. Cyndie Chime    Patient Active Problem List   Diagnosis Date  Noted   Attention deficit disorder 04/04/2023   High risk medication use 04/04/2023   Multiple sclerosis (HCC) 12/27/2018   Dysesthesia 12/27/2018   Gait disturbance 12/27/2018   Slurred speech 12/13/2018   Anxiety 12/13/2018   Breast hypertrophy in female 02/27/2015   Hyponatremia 03/04/2012   Acute cholecystitis 03/02/2012   Morbid obesity (HCC) 08/25/2011   Von Willebrand disease - hx PPH 04/14/2011   History of abnormal Pap smear - had colpo 04/14/2011   Infertility, female 04/14/2011   HSV-2 infection - history, no outbreaks 04/14/2011   Anemia 04/14/2011   Asthma 04/14/2011   History of pyelonephritis - frequent history as a child 04/14/2011   Migraine 04/14/2011   History of depression 04/14/2011   History of smoking 04/14/2011    Past Surgical History:  Procedure Laterality Date   BACK SURGERY     BREAST CYST EXCISION Left 02/20/2018   Procedure: EXCISION OF LEFT BREAST SEBACEOUS CYST;  Surgeon: Emelia Loron, MD;  Location: [facility];  Service: General;  Laterality: Left;   BREAST REDUCTION SURGERY Bilateral 02/27/2015   Procedure: BILATERAL BREAST REDUCTION WITH FREE NIPPLE GRAFT TECHNIQUE ON RIGHT BREAST;  Surgeon: Etter Sjogren, MD;  Location: [facility] OR;  Service: Plastics;  Laterality: Bilateral;   CESAREAN SECTION  2004; 2013   CHOLECYSTECTOMY  03/02/2012   Procedure: LAPAROSCOPIC CHOLECYSTECTOMY WITH INTRAOPERATIVE CHOLANGIOGRAM;  Surgeon: Currie Paris, MD;  Location: [facility] OR;  Service: General;  Laterality: N/A;   DILATION AND CURETTAGE OF UTERUS  ~ 1997; ~ 2005   DILITATION & CURRETTAGE/HYSTROSCOPY WITH THERMACHOICE ABLATION  04/28/2012   Procedure: DILATATION & CURETTAGE/HYSTEROSCOPY WITH THERMACHOICE ABLATION;  Surgeon: Jeani Hawking, MD;  Location: [facility] ORS;  Service: Gynecology;  Laterality: N/A;   ERCP  03/03/2012   Procedure: ENDOSCOPIC RETROGRADE CHOLANGIOPANCREATOGRAPHY (ERCP);  Surgeon: Theda Belfast, MD;  Location: [facility] ENDOSCOPY;   Service: Endoscopy;  Laterality: N/A;  dl/hung   LAPAROSCOPY  4782   LUMBAR DISC SURGERY  2003; 2008; 2009   MULTIPLE TOOTH EXTRACTIONS  "scattered dates"   POSTERIOR LUMBAR FUSION  2010   REDUCTION MAMMAPLASTY Bilateral 02/27/2015   TUBAL LIGATION  2013   WISDOM TOOTH EXTRACTION  2001    OB History     Gravida  3   Para  2   Term  2   Preterm      AB  1   Living  2      SAB      IAB  1   Ectopic      Multiple      Live Births  2            Home Medications    Prior to Admission medications   Medication Sig Start Date End Date Taking? Authorizing Provider  albuterol (VENTOLIN HFA) 108 (90 Base) MCG/ACT inhaler Inhale 1-2 puffs into the lungs every 6 (six) hours as needed for wheezing or shortness of breath. 06/12/23  Yes Glennie Bose, Denny Peon K, PA-C  ALPRAZolam (XANAX) 0.5 MG tablet Take 1 tablet by mouth 2 (two) times daily. 09/27/22  Yes [provider]  amLODipine (NORVASC) 5 MG tablet Take 1 tablet by mouth daily. 05/06/23  Yes [provider]  benzonatate (TESSALON) 100 MG capsule Take 1 capsule (100 mg total) by mouth every 8 (eight) hours. 06/12/23  Yes Charie Pinkus K, PA-C  escitalopram (LEXAPRO) 20 MG tablet Take 20 mg by mouth daily. 04/10/19  Yes [provider]  predniSONE (STERAPRED UNI-PAK 21 TAB) 10 MG (21) TBPK tablet As directed 06/12/23  Yes Oneta Sigman K, PA-C  progesterone (PROMETRIUM) 200 MG capsule Take 200 mg by mouth daily. 12/09/20  Yes [provider]  senna-docusate (SENOKOT-S) 8.6-50 MG tablet Take 1 tablet by mouth 2 (two) times daily. 11/07/20  Yes [provider]  valACYclovir (VALTREX) 500 MG tablet Take 500 mg by mouth as needed. 09/28/21  Yes [provider]  acetaminophen (TYLENOL) 500 MG tablet Take 500 mg by mouth every 6 (six) hours as needed.    [provider]  amLODipine (NORVASC) 2.5 MG tablet Take 1 tablet by mouth daily. 03/31/23   [provider]  buPROPion  (WELLBUTRIN XL) 150 MG 24 hr tablet Take 150 mg by mouth daily.    [provider]  clonazePAM (KLONOPIN) 0.5 MG tablet Take 0.5 mg by mouth as directed.    [provider]  cyclobenzaprine (FLEXERIL) 10 MG tablet Take 1 tablet by mouth 3 (three) times daily.    [provider]  dicyclomine (BENTYL) 10 MG capsule Take 10 mg by mouth as directed.    [provider]  Eflornithine HCl (VANIQA) 13.9 % cream Apply topically 2 (two) times daily with a meal.    [provider]  FLUoxetine (PROZAC) 20  MG capsule Take 60 mg by mouth at bedtime. 12/14/19   [provider]  ibuprofen (ADVIL) 600 MG tablet Take 1 tablet (600 mg total) by mouth every 6 (six) hours as needed. 05/25/22   Domenick Gong, MD  lisdexamfetamine (VYVANSE) 60 MG capsule Take 1 capsule (60 mg total) by mouth every morning. 05/05/23   Huston Foley, MD  meclizine (ANTIVERT) 25 MG tablet Take 25 mg by mouth every 6 (six) hours as needed.    [provider]  medroxyPROGESTERone (PROVERA) 10 MG tablet Take 20 mg by mouth daily.    [provider]  omeprazole (PRILOSEC) 20 MG capsule Take 20 mg by mouth daily as needed (acid reflux).    [provider]  promethazine (PHENERGAN) 25 MG tablet Take 25 mg by mouth as directed.    [provider]  Spacer/Aero-Holding Chambers (AEROCHAMBER MV) inhaler Use as instructed Patient not taking: Reported on 04/04/2023 05/25/22   Domenick Gong, MD  spironolactone (ALDACTONE) 50 MG tablet Take 50 mg by mouth daily.    [provider]  tiZANidine (ZANAFLEX) 4 MG tablet Take 1 tablet (4 mg total) by mouth every 8 (eight) hours as needed for muscle spasms. 04/04/23   Sater, Pearletha Furl, MD  topiramate (TOPAMAX) 100 MG tablet Take 100 mg by mouth daily. 01/27/22   [provider]  tranexamic acid (LYSTEDA) 650 MG TABS tablet Take 1,300 mg by mouth 3 (three) times daily.    [provider]  Von  Willebrand Factor, Recomb, (VONVENDI) 650 units SOLR Vonvendi 1,300 (+/-) unit range intravenous solution    [provider]    Family History Family History  Problem Relation Age of Onset   Stroke Mother    Arthritis Mother    Depression Mother    COPD Father    Kidney disease Paternal Uncle    Stroke Maternal Grandfather    Heart disease Paternal Grandfather    Multiple sclerosis Neg Hx     Social History Social History   Tobacco Use   Smoking status: Former    Current packs/day: 0.00    Average packs/day: 0.3 packs/day for 2.0 years (0.5 ttl pk-yrs)    Types: Cigarettes    Start date: 53    Quit date: 2000    Years since quitting: 25.1   Smokeless tobacco: Never  Substance Use Topics   Alcohol use: Yes    Alcohol/week: 0.0 standard drinks of alcohol    Comment: social   Drug use: Yes    Types: Marijuana    Comment: occasional use, last use last week     Allergies   Prochlorperazine, Compazine [prochlorperazine edisylate], Darvocet [propoxyphene n-acetaminophen], Other, Propoxyphene, Codeine, Sulfa antibiotics, and Sulfamethoxazole   Review of Systems Review of Systems  Constitutional:  Positive for activity change and fatigue. Negative for appetite change and fever.  HENT:  Positive for congestion, postnasal drip and sore throat. Negative for sinus pressure and sneezing.   Respiratory:  Positive for cough and shortness of breath.   Cardiovascular:  Negative for chest pain.  Gastrointestinal:  Negative for abdominal pain, diarrhea, nausea and vomiting.  Musculoskeletal:  Positive for arthralgias and myalgias.  Neurological:  Positive for headaches. Negative for dizziness and light-headedness.     Physical Exam Triage Vital Signs ED Triage Vitals  Encounter Vitals Group     BP 06/12/23 0826 (!) 156/104     Systolic BP Percentile --      Diastolic BP Percentile --  Pulse Rate 06/12/23 0826 82     Resp 06/12/23 0826 18     Temp 06/12/23  0826 98.7 F (37.1 C)     Temp Source 06/12/23 0826 Oral     SpO2 06/12/23 0826 96 %     Weight --      Height --      Head Circumference --      Peak Flow --      Pain Score 06/12/23 0827 5     Pain Loc --      Pain Education --      Exclude from Growth Chart --    No data found.  Updated Vital Signs BP (!) 156/104 (BP Location: Left Arm)   Pulse 82   Temp 98.7 F (37.1 C) (Oral)   Resp 18   SpO2 96%   Visual Acuity Right Eye Distance:   Left Eye Distance:   Bilateral Distance:    Right Eye Near:   Left Eye Near:    Bilateral Near:     Physical Exam Vitals reviewed.  Constitutional:      General: She is awake. She is not in acute distress.    Appearance: Normal appearance. She is well-developed. She is not ill-appearing.     Comments: Very pleasant female appears stated age in no acute distress sitting comfortably in exam room  HENT:     Head: Normocephalic and atraumatic.     Right Ear: Tympanic membrane, ear canal and external ear normal. Tympanic membrane is not erythematous or bulging.     Left Ear: Tympanic membrane, ear canal and external ear normal. Tympanic membrane is not erythematous or bulging.     Nose:     Right Sinus: No maxillary sinus tenderness or frontal sinus tenderness.     Left Sinus: No maxillary sinus tenderness or frontal sinus tenderness.     Mouth/Throat:     Pharynx: Uvula midline. No oropharyngeal exudate or posterior oropharyngeal erythema.  Cardiovascular:     Rate and Rhythm: Normal rate and regular rhythm.     Heart sounds: Normal heart sounds, S1 normal and S2 normal. No murmur heard. Pulmonary:     Effort: Pulmonary effort is normal.     Breath sounds: Decreased breath sounds present. No wheezing, rhonchi or rales.  Psychiatric:        Behavior: Behavior is cooperative.      UC Treatments / Results  Labs (all labs ordered are listed, but only abnormal results are displayed) Labs Reviewed  POCT INFLUENZA A/B - Normal   SARS CORONAVIRUS 2 (TAT 6-24 HRS)    EKG   Radiology No results found.  Procedures Procedures (including critical care time)  Medications Ordered in UC Medications  ipratropium-albuterol (DUONEB) 0.5-2.5 (3) MG/3ML nebulizer solution 3 mL (3 mLs Nebulization Given 06/12/23 0841)    Initial Impression / Assessment and Plan / UC Course  I have reviewed the triage vital signs and the nursing notes.  Pertinent labs & imaging results that were available during my care of the patient were reviewed by me and considered in my medical decision making (see chart for details).     Patient is well-appearing, afebrile, nontoxic, nontachycardic.  No evidence of acute infection on physical exam that would warrant initiation of antibiotics.  Flu testing was negative in clinic.  COVID testing is pending.  Unfortunately, by the time we have results she will be outside the window of effectiveness for Paxlovid so we will defer antiviral therapy.  I suspect that the viral illness she is suffering from has triggered her asthma.  She was given a DuoNeb with some improvement of symptoms including better aeration of bilateral bases.  Chest x-ray was deferred as she had no adventitious lung sounds and her oxygen saturation was 96%.  She was provided a refill of albuterol as well as will start prednisone taper.  Discussed that she should not take NSAIDs with prednisone due to risk of GI bleeding.  She can continue over-the-counter medication including Mucinex, Flonase, Tylenol.  She is to rest and drink plenty of fluid.  If her symptoms are not improving within a week or if she has any worsening symptoms she needs to be seen immediately.  Strict return precautions given.  Work excuse note provided.  Final Clinical Impressions(s) / UC Diagnoses   Final diagnoses:  Viral URI with cough  Mild intermittent asthma with acute exacerbation     Discharge Instructions      You are negative for flu.  We will contact  you if you are positive for COVID.  I am concerned that you have a viral illness that has triggered your asthma.  Use albuterol every 4-6 hours as needed.  Start prednisone as prescribed.  Do not take NSAIDs with this medication including aspirin, ibuprofen/Advil, naproxen/Aleve.  Use benzonatate for cough.  I recommend over-the-counter medication including Mucinex, Flonase, Tylenol, nasal saline/sinus rinses.  Make sure you rest and drink plenty of fluid.  If your symptoms are not improving within a week or if anything worsens and you have high fever, worsening cough, shortness of breath, chest pain, nausea/vomiting you need to be seen immediately.     ED Prescriptions     Medication Sig Dispense Auth. Provider   albuterol (VENTOLIN HFA) 108 (90 Base) MCG/ACT inhaler Inhale 1-2 puffs into the lungs every 6 (six) hours as needed for wheezing or shortness of breath. 18 g Kasidy Gianino K, PA-C   predniSONE (STERAPRED UNI-PAK 21 TAB) 10 MG (21) TBPK tablet As directed 21 tablet Dewitt Judice K, PA-C   benzonatate (TESSALON) 100 MG capsule Take 1 capsule (100 mg total) by mouth every 8 (eight) hours. 21 capsule Vincenza Dail K, PA-C      PDMP not reviewed this encounter.   Jeani Hawking, PA-C 06/12/23 [phone]

## 2023-06-12 NOTE — Discharge Instructions (Addendum)
You are negative for flu.  We will contact you if you are positive for COVID.  I am concerned that you have a viral illness that has triggered your asthma.  Use albuterol every 4-6 hours as needed.  Start prednisone as prescribed.  Do not take NSAIDs with this medication including aspirin, ibuprofen/Advil, naproxen/Aleve.  Use benzonatate for cough.  I recommend over-the-counter medication including Mucinex, Flonase, Tylenol, nasal saline/sinus rinses.  Make sure you rest and drink plenty of fluid.  If your symptoms are not improving within a week or if anything worsens and you have high fever, worsening cough, shortness of breath, chest pain, nausea/vomiting you need to be seen immediately.

## 2023-06-12 NOTE — ED Triage Notes (Signed)
Pt here for cough and congestion x 5 days worse over last 2 days; pt sts body aches and HA

## 2023-06-13 LAB — SARS CORONAVIRUS 2 (TAT 6-24 HRS): SARS Coronavirus 2: NEGATIVE

## 2023-06-15 ENCOUNTER — Other Ambulatory Visit: Payer: Self-pay | Admitting: Neurology

## 2023-06-15 MED ORDER — LISDEXAMFETAMINE DIMESYLATE 60 MG PO CAPS: 60.0000 mg | ORAL_CAPSULE | Freq: Every morning | ORAL | 0 refills | Status: DC

## 2023-07-21 ENCOUNTER — Other Ambulatory Visit: Payer: Self-pay | Admitting: Neurology

## 2023-07-21 MED ORDER — LISDEXAMFETAMINE DIMESYLATE 60 MG PO CAPS
60.0000 mg | ORAL_CAPSULE | Freq: Every morning | ORAL | 0 refills | Status: DC
Start: 2023-07-21 — End: 2023-08-24

## 2023-07-21 NOTE — Telephone Encounter (Signed)
 Last seen 04/04/23 and next f/u 10/20/23. Last refilled 06/15/23 #30.

## 2023-08-24 ENCOUNTER — Other Ambulatory Visit: Payer: Self-pay | Admitting: Neurology

## 2023-08-25 MED ORDER — LISDEXAMFETAMINE DIMESYLATE 60 MG PO CAPS: 60.0000 mg | ORAL_CAPSULE | Freq: Every morning | ORAL | 0 refills | Status: DC

## 2023-09-25 ENCOUNTER — Encounter

## 2023-10-04 ENCOUNTER — Encounter: Payer: Self-pay | Admitting: Neurology

## 2023-10-04 ENCOUNTER — Other Ambulatory Visit: Payer: Self-pay | Admitting: *Deleted

## 2023-10-04 MED ORDER — LISDEXAMFETAMINE DIMESYLATE 60 MG PO CAPS: 60.0000 mg | ORAL_CAPSULE | Freq: Every morning | ORAL | 0 refills | Status: DC

## 2023-10-04 NOTE — Telephone Encounter (Signed)
 Pt sent mychart message asking for refill. Last seen 03/2023. Next f/u 10/20/23. Last refilled 08/25/23 #30.

## 2023-10-20 ENCOUNTER — Ambulatory Visit: Payer: 59 | Admitting: Neurology

## 2023-11-07 ENCOUNTER — Encounter: Payer: Self-pay | Admitting: Internal Medicine

## 2023-11-09 ENCOUNTER — Telehealth: Admitting: Family Medicine

## 2023-11-09 DIAGNOSIS — R3989 Other symptoms and signs involving the genitourinary system: Secondary | ICD-10-CM

## 2023-11-09 MED ORDER — CEPHALEXIN 500 MG PO CAPS
500.0000 mg | ORAL_CAPSULE | Freq: Two times a day (BID) | ORAL | 0 refills | Status: AC
Start: 2023-11-09 — End: 2023-11-16

## 2023-11-09 NOTE — Progress Notes (Signed)

## 2023-11-12 ENCOUNTER — Other Ambulatory Visit: Payer: Self-pay | Admitting: Neurology

## 2023-11-14 ENCOUNTER — Encounter: Payer: Self-pay | Admitting: Neurology

## 2023-11-14 NOTE — Telephone Encounter (Signed)
 Last seen on 04/04/23 Follow up scheduled on 04/03/24

## 2023-11-15 MED ORDER — LISDEXAMFETAMINE DIMESYLATE 60 MG PO CAPS: 60.0000 mg | ORAL_CAPSULE | Freq: Every morning | ORAL | 0 refills | Status: DC

## 2023-11-15 NOTE — Telephone Encounter (Signed)
 Last seen on 04/04/23 Follow up scheduled on 04/03/24   Dispensed Days Supply Quantity Provider Pharmacy  LISDEXAMFETAMINE 60MG  CAP 10/09/2023 30 30 each Sater, Charlie LABOR, MD [facility] [phone] ...     Rx pending to be signed

## 2023-11-25 ENCOUNTER — Ambulatory Visit

## 2023-11-25 VITALS — Ht 62.0 in | Wt 185.0 lb

## 2023-11-25 DIAGNOSIS — Z1211 Encounter for screening for malignant neoplasm of colon: Secondary | ICD-10-CM

## 2023-11-25 MED ORDER — NA SULFATE-K SULFATE-MG SULF 17.5-3.13-1.6 GM/177ML PO SOLN: 1.0000 | Freq: Once | ORAL | 0 refills | Status: AC

## 2023-11-25 NOTE — Progress Notes (Signed)

## 2023-11-29 ENCOUNTER — Telehealth: Payer: Self-pay | Admitting: Internal Medicine

## 2023-11-29 ENCOUNTER — Encounter: Payer: Self-pay | Admitting: Internal Medicine

## 2023-11-29 NOTE — Telephone Encounter (Signed)
 Call returned all questions answer. Outside medications can not be administered at the facility,

## 2023-11-29 NOTE — Telephone Encounter (Signed)
 Inbound call from [place] with [facility] requesting f/u call to her direct line [phone]  Inquiring if a medication can be administered to patient at the time of her upcoming procedure.   Please advise.

## 2023-11-30 ENCOUNTER — Encounter: Payer: Self-pay | Admitting: Neurology

## 2023-12-01 ENCOUNTER — Other Ambulatory Visit: Payer: Self-pay | Admitting: Neurology

## 2023-12-01 ENCOUNTER — Encounter: Payer: Self-pay | Admitting: Neurology

## 2023-12-01 MED ORDER — AMPHETAMINE-DEXTROAMPHETAMINE 10 MG PO TABS: 10.0000 mg | ORAL_TABLET | Freq: Every day | ORAL | 0 refills | Status: AC

## 2023-12-19 ENCOUNTER — Encounter: Payer: Self-pay | Admitting: Neurology

## 2023-12-19 ENCOUNTER — Ambulatory Visit (AMBULATORY_SURGERY_CENTER): Admitting: Internal Medicine

## 2023-12-19 ENCOUNTER — Encounter: Payer: Self-pay | Admitting: Internal Medicine

## 2023-12-19 VITALS — BP 115/65 | HR 81 | Temp 97.0°F | Resp 13 | Ht 62.5 in | Wt 185.0 lb

## 2023-12-19 DIAGNOSIS — K648 Other hemorrhoids: Secondary | ICD-10-CM | POA: Diagnosis not present

## 2023-12-19 DIAGNOSIS — Z1211 Encounter for screening for malignant neoplasm of colon: Secondary | ICD-10-CM | POA: Diagnosis present

## 2023-12-19 DIAGNOSIS — K5730 Diverticulosis of large intestine without perforation or abscess without bleeding: Secondary | ICD-10-CM

## 2023-12-19 MED ORDER — SODIUM CHLORIDE 0.9 % IV SOLN: 500.0000 mL | INTRAVENOUS | Status: DC

## 2023-12-19 MED ORDER — LISDEXAMFETAMINE DIMESYLATE 60 MG PO CAPS: 60.0000 mg | ORAL_CAPSULE | Freq: Every morning | ORAL | 0 refills | Status: DC

## 2023-12-19 NOTE — Progress Notes (Signed)
 Report to PACU, RN, vss, BBS= Clear.

## 2023-12-19 NOTE — Telephone Encounter (Signed)
 Last seen on 04/04/23 Follow up scheduled 04/03/24   Dispensed Days Supply Quantity Provider Pharmacy  LISDEXAMFETAMINE 60MG  CAP 11/16/2023 30 30 each Sater, Charlie LABOR, MD [facility] [phone] ...      Rx pending to be signed

## 2023-12-19 NOTE — Patient Instructions (Signed)

## 2023-12-19 NOTE — Op Note (Signed)
 [facility] Patient Name: Ann [name] Procedure Date: 12/19/2023 10:29 AM MRN: [id number] Endoscopist: Rosario Estefana Kidney , , [id number] Age: 43 Referring MD:  Date of Birth: 08-16-1980 Gender: Female Account #: [id number] Procedure:                Colonoscopy Indications:              Screening for colorectal malignant neoplasm, This                            is the patient's first colonoscopy Medicines:                Monitored Anesthesia Care Procedure:                Pre-Anesthesia Assessment:                           - Prior to the procedure, a History and Physical                            was performed, and patient medications and                            allergies were reviewed. The patient's tolerance of                            previous anesthesia was also reviewed. The risks                            and benefits of the procedure and the sedation                            options and risks were discussed with the patient.                            All questions were answered, and informed consent                            was obtained. Prior Anticoagulants: The patient has                            taken no anticoagulant or antiplatelet agents. ASA                            Grade Assessment: II - A patient with mild systemic                            disease. After reviewing the risks and benefits,                            the patient was deemed in satisfactory condition to                            undergo the procedure.  After obtaining informed consent, the colonoscope                            was passed under direct vision. Throughout the                            procedure, the patient's blood pressure, pulse, and                            oxygen saturations were monitored continuously. The                            Colonoscope was introduced through the anus and                            advanced to  the the terminal ileum. The colonoscopy                            was performed without difficulty. The patient                            tolerated the procedure well. The quality of the                            bowel preparation was good. The terminal ileum,                            ileocecal valve, appendiceal orifice, and rectum                            were photographed. Scope In: 10:51:04 AM Scope Out: 11:11:53 AM Scope Withdrawal Time: 0 hours 17 minutes 14 seconds  Total Procedure Duration: 0 hours 20 minutes 49 seconds  Findings:                 The terminal ileum appeared normal.                           Multiple diverticula were found in the sigmoid                            colon and descending colon.                           Non-bleeding internal hemorrhoids were found during                            retroflexion. Complications:            No immediate complications. Estimated Blood Loss:     Estimated blood loss: none. Impression:               - The examined portion of the ileum was normal.                           - Diverticulosis in the sigmoid colon and in the  descending colon.                           - Non-bleeding internal hemorrhoids.                           - No specimens collected. Recommendation:           - Discharge patient to home (with escort).                           - Repeat colonoscopy in 10 years for screening                            purposes.                           - The findings and recommendations were discussed                            with the patient. Dr Estefana Federico Rosario Estefana Federico,  12/19/2023 11:14:55 AM

## 2023-12-19 NOTE — Progress Notes (Signed)
 Pt's states no medical or surgical changes since previsit or office visit.

## 2023-12-19 NOTE — Progress Notes (Signed)
 GASTROENTEROLOGY PROCEDURE H&P NOTE   Primary Care Physician: Jolee Madelin Patch, MD    Reason for Procedure:   Colon cancer screening  Plan:    Colonoscopy  Patient is appropriate for endoscopic procedure(s) in the ambulatory ([facility]) setting.  The nature of the procedure, as well as the risks, benefits, and alternatives were carefully and thoroughly reviewed with the patient. Ample time for discussion and questions allowed. The patient understood, was satisfied, and agreed to proceed.     HPI: Ann [name] is a 43 y.o. female who presents for colonoscopy for colon cancer screening. Denies blood in stools, changes in bowel habits, or unintentional weight loss. Denies family history of colon cancer.  Past Medical History:  Diagnosis Date   ADD (attention deficit disorder)    takes Adderall daily   Anemia    Anxiety    takes Xanax  daily as needed   Arthritis    Asthma    mild case per pt   Chronic lower back pain    Clotting disorder (HCC)    Depression    has meds prescribed but doesn't take them   Family history of adverse reaction to anesthesia    Mom gets PONV too   Headache    monthly (02/27/2015)   Hemorrhoids    History of esophagogastroduodenoscopy (EGD)    Hypertension    Migraine    none in the last 6 months (02/27/2015)   Multiple sclerosis (HCC)    Nocturia    Pneumonia several times   PONV (postoperative nausea and vomiting)    Von Willebrand disease (HCC)    Dr. Freddie    Past Surgical History:  Procedure Laterality Date   BACK SURGERY     BREAST CYST EXCISION Left 02/20/2018   Procedure: EXCISION OF LEFT BREAST SEBACEOUS CYST;  Surgeon: Ebbie Cough, MD;  Location: [facility];  Service: General;  Laterality: Left;   BREAST REDUCTION SURGERY Bilateral 02/27/2015   Procedure: BILATERAL BREAST REDUCTION WITH FREE NIPPLE GRAFT TECHNIQUE ON RIGHT BREAST;  Surgeon: Alm Sick, MD;  Location: [facility] OR;   Service: Plastics;  Laterality: Bilateral;   CESAREAN SECTION  2004; 2013   CHOLECYSTECTOMY  03/02/2012   Procedure: LAPAROSCOPIC CHOLECYSTECTOMY WITH INTRAOPERATIVE CHOLANGIOGRAM;  Surgeon: Sherlean JINNY Laughter, MD;  Location: [facility] OR;  Service: General;  Laterality: N/A;   DILATION AND CURETTAGE OF UTERUS  ~ 1997; ~ 2005   DILITATION & CURRETTAGE/HYSTROSCOPY WITH THERMACHOICE ABLATION  04/28/2012   Procedure: DILATATION & CURETTAGE/HYSTEROSCOPY WITH THERMACHOICE ABLATION;  Surgeon: Rosaline LITTIE Cobble, MD;  Location: [facility] ORS;  Service: Gynecology;  Laterality: N/A;   ERCP  03/03/2012   Procedure: ENDOSCOPIC RETROGRADE CHOLANGIOPANCREATOGRAPHY (ERCP);  Surgeon: Belvie JONETTA Just, MD;  Location: [facility] ENDOSCOPY;  Service: Endoscopy;  Laterality: N/A;  dl/hung   LAPAROSCOPY  7988   LUMBAR DISC SURGERY  2003; 2008; 2009   MULTIPLE TOOTH EXTRACTIONS  scattered dates   POSTERIOR LUMBAR FUSION  2010   REDUCTION MAMMAPLASTY Bilateral 02/27/2015   TUBAL LIGATION  2013   WISDOM TOOTH EXTRACTION  2001    Prior to Admission medications   Medication Sig Start Date End Date Taking? Authorizing Provider  acetaminophen  (TYLENOL ) 500 MG tablet Take 500 mg by mouth every 6 (six) hours as needed.    [provider]  albuterol  (VENTOLIN  HFA) 108 (90 Base) MCG/ACT inhaler Inhale 1-2 puffs into the lungs every 6 (six) hours as needed for wheezing or shortness of breath. 06/12/23   Raspet, Rocky  K, PA-C  ALPRAZolam  (XANAX ) 0.5 MG tablet Take 1 tablet by mouth 2 (two) times daily. 09/27/22   [provider]  amLODipine  (NORVASC ) 5 MG tablet Take 1 tablet by mouth daily. 05/06/23   [provider]  amphetamine -dextroamphetamine  (ADDERALL) 10 MG tablet Take 1 tablet (10 mg total) by mouth daily with breakfast. Take one pill  po  once or twice in the afternoon 12/01/23   Sater, Charlie LABOR, MD  clonazePAM (KLONOPIN) 0.5 MG tablet Take 0.5 mg by mouth as directed.    [provider]   cyclobenzaprine  (FLEXERIL ) 10 MG tablet Take 1 tablet by mouth 3 (three) times daily.    [provider]  Eflornithine HCl (VANIQA) 13.9 % cream Apply topically 2 (two) times daily with a meal.    [provider]  escitalopram (LEXAPRO) 20 MG tablet Take 20 mg by mouth daily. 04/10/19   [provider]  FLUoxetine  (PROZAC ) 20 MG capsule Take 60 mg by mouth at bedtime. 12/14/19   [provider]  ibuprofen  (ADVIL ) 600 MG tablet Take 1 tablet (600 mg total) by mouth every 6 (six) hours as needed. Patient not taking: Reported on 11/25/2023 05/25/22   Mortenson, Ashley, MD  lisdexamfetamine (VYVANSE ) 60 MG capsule Take 1 capsule (60 mg total) by mouth every morning. 11/15/23   Sater, Charlie LABOR, MD  meclizine (ANTIVERT) 25 MG tablet Take 25 mg by mouth every 6 (six) hours as needed. Patient not taking: Reported on 11/25/2023    [provider]  medroxyPROGESTERone (PROVERA) 10 MG tablet Take 20 mg by mouth daily.    [provider]  omeprazole (PRILOSEC) 20 MG capsule Take 20 mg by mouth daily as needed (acid reflux).    [provider]  predniSONE  (STERAPRED UNI-PAK 21 TAB) 10 MG (21) TBPK tablet As directed 06/12/23   Raspet, Erin K, PA-C  progesterone (PROMETRIUM) 200 MG capsule Take 200 mg by mouth daily. 12/09/20   [provider]  promethazine  (PHENERGAN ) 25 MG tablet Take 25 mg by mouth as directed.    [provider]  [facility] MANAGEMENT [place]     [provider]  senna-docusate (SENOKOT-S) 8.6-50 MG tablet Take 1 tablet by mouth 2 (two) times daily. 11/07/20   [provider]  Spacer/Aero-Holding Chambers (AEROCHAMBER MV) inhaler Use as instructed 05/25/22   Van Knee, MD  spironolactone (ALDACTONE) 50 MG tablet Take 50 mg by mouth daily.    [provider]  tiZANidine  (ZANAFLEX ) 4 MG tablet TAKE 1 TABLET(4 MG) BY MOUTH EVERY 8 HOURS AS NEEDED FOR MUSCLE SPASMS 11/14/23   Sater, Charlie LABOR, MD  topiramate (TOPAMAX) 100 MG tablet Take 100 mg by mouth daily. 01/27/22   [provider]  tranexamic acid (LYSTEDA) 650 MG TABS tablet Take 1,300 mg by mouth 3 (three) times daily. Patient not taking: Reported on 11/25/2023    [provider]  Ublituximab-xiiy (BRIUMVI IV) Inject into the vein.    [provider]  valACYclovir  (VALTREX ) 500 MG tablet Take 500 mg by mouth as needed. 09/28/21   [provider]  Von Willebrand Factor , Recomb, (VONVENDI ) 650 units SOLR Vonvendi  1,300 (+/-) unit range intravenous solution    [provider]    Current Outpatient Medications  Medication Sig Dispense Refill   acetaminophen  (TYLENOL ) 500 MG tablet Take 500 mg by mouth every 6 (six) hours as needed.     albuterol  (VENTOLIN  HFA) 108 (90 Base) MCG/ACT inhaler Inhale 1-2 puffs into the lungs  every 6 (six) hours as needed for wheezing or shortness of breath. 18 g 0   ALPRAZolam  (XANAX ) 0.5 MG tablet Take 1 tablet by mouth 2 (two) times daily.     amLODipine  (NORVASC ) 5 MG tablet Take 1 tablet by mouth daily.     amphetamine -dextroamphetamine  (ADDERALL) 10 MG tablet Take 1 tablet (10 mg total) by mouth daily with breakfast. Take one pill  po  once or twice in the afternoon 60 tablet 0   clonazePAM (KLONOPIN) 0.5 MG tablet Take 0.5 mg by mouth as directed.     cyclobenzaprine  (FLEXERIL ) 10 MG tablet Take 1 tablet by mouth 3 (three) times daily.     Eflornithine HCl (VANIQA) 13.9 % cream Apply topically 2 (two) times daily with a meal.     escitalopram (LEXAPRO) 20 MG tablet Take 20 mg by mouth daily.     FLUoxetine  (PROZAC ) 20 MG capsule Take 60 mg by mouth at bedtime.     ibuprofen  (ADVIL ) 600 MG tablet Take 1 tablet (600 mg total) by mouth every 6 (six) hours as needed. (Patient not taking: Reported on 11/25/2023) 30 tablet 0   lisdexamfetamine (VYVANSE ) 60 MG capsule Take 1 capsule (60 mg total) by mouth every morning. 30 capsule 0   meclizine (ANTIVERT) 25  MG tablet Take 25 mg by mouth every 6 (six) hours as needed. (Patient not taking: Reported on 11/25/2023)     medroxyPROGESTERone (PROVERA) 10 MG tablet Take 20 mg by mouth daily.     omeprazole (PRILOSEC) 20 MG capsule Take 20 mg by mouth daily as needed (acid reflux).     predniSONE  (STERAPRED UNI-PAK 21 TAB) 10 MG (21) TBPK tablet As directed 21 tablet 0   progesterone (PROMETRIUM) 200 MG capsule Take 200 mg by mouth daily.     promethazine  (PHENERGAN ) 25 MG tablet Take 25 mg by mouth as directed.     SEMAGLUTIDE-WEIGHT MANAGEMENT [place]      senna-docusate (SENOKOT-S) 8.6-50 MG tablet Take 1 tablet by mouth 2 (two) times daily.     Spacer/Aero-Holding Chambers (AEROCHAMBER MV) inhaler Use as instructed 1 each 1   spironolactone (ALDACTONE) 50 MG tablet Take 50 mg by mouth daily.     tiZANidine  (ZANAFLEX ) 4 MG tablet TAKE 1 TABLET(4 MG) BY MOUTH EVERY 8 HOURS AS NEEDED FOR MUSCLE SPASMS 90 tablet 1   topiramate (TOPAMAX) 100 MG tablet Take 100 mg by mouth daily.     tranexamic acid (LYSTEDA) 650 MG TABS tablet Take 1,300 mg by mouth 3 (three) times daily. (Patient not taking: Reported on 11/25/2023)     Ublituximab-xiiy (BRIUMVI IV) Inject into the vein.     valACYclovir  (VALTREX ) 500 MG tablet Take 500 mg by mouth as needed.     Von Willebrand Factor , Recomb, (VONVENDI ) 650 units SOLR Vonvendi  1,300 (+/-) unit range intravenous solution     Current Facility-Administered Medications  Medication Dose Route Frequency Provider Last Rate Last Admin   0.9 %  sodium chloride  infusion  500 mL Intravenous Continuous Federico Rosario BROCKS, MD        Allergies as of 12/19/2023 - Review Complete 11/25/2023  Allergen Reaction Noted   Compazine  [prochlorperazine  edisylate]  12/27/2018   Prochlorperazine  Other (See Comments) 12/14/2018   Darvocet [propoxyphene n-acetaminophen ] Nausea And Vomiting    Other Other (See Comments) 06/12/2023   Propoxyphene Nausea And Vomiting 08/30/2019   Codeine Nausea And Vomiting  and Rash 09/11/2012   Sulfa antibiotics Rash 06/12/2023   Sulfamethoxazole Rash 09/11/2012  Family History  Problem Relation Age of Onset   Colon polyps Mother    Stroke Mother    Arthritis Mother    Depression Mother    COPD Father    Kidney disease Paternal Uncle    Stroke Maternal Grandfather    Heart disease Paternal Grandfather    Multiple sclerosis Neg Hx    Colon cancer Neg Hx    Esophageal cancer Neg Hx    Rectal cancer Neg Hx    Stomach cancer Neg Hx     Social History   Socioeconomic History   Marital status: Married    Spouse name: Not on file   Number of children: Not on file   Years of education: Not on file   Highest education level: Not on file  Occupational History   Occupation: regulatory affairs  Tobacco Use   Smoking status: Former    Current packs/day: 0.00    Average packs/day: 0.3 packs/day for 2.0 years (0.5 ttl pk-yrs)    Types: Cigarettes    Start date: 4    Quit date: 2000    Years since quitting: 25.6   Smokeless tobacco: Never  Vaping Use   Vaping status: Never Used  Substance and Sexual Activity   Alcohol use: Yes    Alcohol/week: 0.0 standard drinks of alcohol    Comment: social   Drug use: Never   Sexual activity: Yes    Birth control/protection: None    Comment: BTL  Other Topics Concern   Not on file  Social History Narrative   Lives with husband and kids   Caffeine use:    16 oz per day   Right handed   Social Drivers of Corporate investment banker Strain: Not on file  Food Insecurity: Low Risk  (08/02/2023)   Received from [facility]   Hunger Vital Sign    Within the past 12 months, you worried that your food would run out before you got money to buy more: Never true    Within the past 12 months, the food you bought just didn't last and you didn't have money to get more. : Never true  Transportation Needs: No Transportation Needs (08/02/2023)   Received from [facility]    In the past 12  months, has lack of reliable transportation kept you from medical appointments, meetings, work or from getting things needed for daily living? : No  Physical Activity: Not on file  Stress: Not on file  Social Connections: Not on file  Intimate Partner Violence: Not on file    Physical Exam: Vital signs in last 24 hours: BP (!) 145/97   Pulse 85   Temp (!) 97 F (36.1 C) (Temporal)   Ht 5' 2.5 (1.588 m)   Wt 185 lb (83.9 kg)   SpO2 99%   BMI 33.30 kg/m  GEN: NAD EYE: Sclerae anicteric ENT: MMM CV: Non-tachycardic Pulm: No increased work of breathing GI: Soft, NT/ND NEURO:  Alert & Oriented   Estefana Kidney, MD [facility]  12/19/2023 10:29 AM

## 2023-12-20 ENCOUNTER — Telehealth: Payer: Self-pay

## 2023-12-20 NOTE — Telephone Encounter (Signed)
 Left message on follow up call.

## 2023-12-21 NOTE — Pre-Procedure Instructions (Signed)
 Surgical Instructions   Your procedure is scheduled on December 27, 2023. Report to [facility] Main Entrance A at 5:30 A.M., then check in with the Admitting office. Any questions or running late day of surgery: call [phone]  Questions prior to your surgery date: call [phone], Monday-Friday, 8am-4pm. If you experience any cold or flu symptoms such as cough, fever, chills, shortness of breath, etc. between now and your scheduled surgery, please notify us  at the above number.     Remember:  Do not eat or drink after midnight the night before your surgery    Take these medicines the morning of surgery with A SIP OF WATER : amLODipine (NORVASC)  FLUoxetine (PROZAC)    May take these medicines IF NEEDED: acetaminophen  (TYLENOL )  albuterol  (VENTOLIN  HFA) inhaler - please bring your inhaler with you morning of surgery ALPRAZolam  (XANAX )  hydrOXYzine  (ATARAX )  tiZANidine  (ZANAFLEX )    STOP taking your SEMAGLUTIDE one week prior to surgery. DO NOT take any doses after August 11th.   One week prior to surgery, STOP taking any Aspirin (unless otherwise instructed by your surgeon) Aleve, Naproxen, Ibuprofen , Motrin , Advil , Goody's, BC's, all herbal medications, fish oil, and non-prescription vitamins.                     Do NOT Smoke (Tobacco/Vaping) for 24 hours prior to your procedure.  If you use a CPAP at night, you may bring your mask/headgear for your overnight stay.   You will be asked to remove any contacts, glasses, piercing's, hearing aid's, dentures/partials prior to surgery. Please bring cases for these items if needed.    Patients discharged the day of surgery will not be allowed to drive home, and someone needs to stay with them for 24 hours.  SURGICAL WAITING ROOM VISITATION Patients may have no more than 2 support people in the waiting area - these visitors may rotate.   Pre-op nurse will coordinate an appropriate time for 1 ADULT support person, who may not  rotate, to accompany patient in pre-op.  Children under the age of 83 must have an adult with them who is not the patient and must remain in the main waiting area with an adult.  If the patient needs to stay at the hospital during part of their recovery, the visitor guidelines for inpatient rooms apply.  Please refer to the [facility] website for the visitor guidelines for any additional information.   If you received a COVID test during your pre-op visit  it is requested that you wear a mask when out in public, stay away from anyone that may not be feeling well and notify your surgeon if you develop symptoms. If you have been in contact with anyone that has tested positive in the last 10 days please notify you surgeon.      Pre-operative CHG Bathing Instructions   You can play a key role in reducing the risk of infection after surgery. Your skin needs to be as free of germs as possible. You can reduce the number of germs on your skin by washing with CHG (chlorhexidine  gluconate) soap before surgery. CHG is an antiseptic soap that kills germs and continues to kill germs even after washing.   DO NOT use if you have an allergy to chlorhexidine /CHG or antibacterial soaps. If your skin becomes reddened or irritated, stop using the CHG and notify one of our RNs at [phone].  TAKE A SHOWER THE NIGHT BEFORE SURGERY AND THE DAY OF SURGERY    Please keep in mind the following:  DO NOT shave, including legs and underarms, 48 hours prior to surgery.   You may shave your face before/day of surgery.  Place clean sheets on your bed the night before surgery Use a clean washcloth (not used since being washed) for each shower. DO NOT sleep with pet's night before surgery.  CHG Shower Instructions:  Wash your face and private area with normal soap. If you choose to wash your hair, wash first with your normal shampoo.  After you use shampoo/soap, rinse your hair and body thoroughly to  remove shampoo/soap residue.  Turn the water  OFF and apply half the bottle of CHG soap to a CLEAN washcloth.  Apply CHG soap ONLY FROM YOUR NECK DOWN TO YOUR TOES (washing for 3-5 minutes)  DO NOT use CHG soap on face, private areas, open wounds, or sores.  Pay special attention to the area where your surgery is being performed.  If you are having back surgery, having someone wash your back for you may be helpful. Wait 2 minutes after CHG soap is applied, then you may rinse off the CHG soap.  Pat dry with a clean towel  Put on clean pajamas    Additional instructions for the day of surgery: DO NOT APPLY any lotions, deodorants, cologne, or perfumes.   Do not wear jewelry or makeup Do not wear nail polish, gel polish, artificial nails, or any other type of covering on natural nails (fingers and toes) Do not bring valuables to the hospital. [facility] is not responsible for valuables/personal belongings. Put on clean/comfortable clothes.  Please brush your teeth.  Ask your nurse before applying any prescription medications to the skin.

## 2023-12-22 ENCOUNTER — Encounter (HOSPITAL_COMMUNITY)
Admission: RE | Admit: 2023-12-22 | Discharge: 2023-12-22 | Disposition: A | Discharge status: Home / Self Care | Source: Ambulatory Visit | Attending: Obstetrics and Gynecology | Admitting: Obstetrics and Gynecology

## 2023-12-22 ENCOUNTER — Other Ambulatory Visit: Payer: Self-pay

## 2023-12-22 ENCOUNTER — Encounter (HOSPITAL_COMMUNITY): Payer: Self-pay

## 2023-12-22 VITALS — BP 148/93 | HR 90 | Temp 98.4°F | Resp 16 | Ht 62.0 in | Wt 185.4 lb

## 2023-12-22 DIAGNOSIS — R519 Headache, unspecified: Secondary | ICD-10-CM | POA: Insufficient documentation

## 2023-12-22 DIAGNOSIS — R102 Pelvic and perineal pain: Secondary | ICD-10-CM | POA: Insufficient documentation

## 2023-12-22 DIAGNOSIS — Z6833 Body mass index (BMI) 33.0-33.9, adult: Secondary | ICD-10-CM | POA: Diagnosis not present

## 2023-12-22 DIAGNOSIS — D6800 Von Willebrand disease, unspecified: Secondary | ICD-10-CM | POA: Insufficient documentation

## 2023-12-22 DIAGNOSIS — Z7984 Long term (current) use of oral hypoglycemic drugs: Secondary | ICD-10-CM | POA: Diagnosis not present

## 2023-12-22 DIAGNOSIS — F319 Bipolar disorder, unspecified: Secondary | ICD-10-CM | POA: Insufficient documentation

## 2023-12-22 DIAGNOSIS — F988 Other specified behavioral and emotional disorders with onset usually occurring in childhood and adolescence: Secondary | ICD-10-CM | POA: Diagnosis not present

## 2023-12-22 DIAGNOSIS — E66812 Obesity, class 2: Secondary | ICD-10-CM | POA: Insufficient documentation

## 2023-12-22 DIAGNOSIS — I2510 Atherosclerotic heart disease of native coronary artery without angina pectoris: Secondary | ICD-10-CM | POA: Insufficient documentation

## 2023-12-22 DIAGNOSIS — Z9884 Bariatric surgery status: Secondary | ICD-10-CM | POA: Diagnosis not present

## 2023-12-22 DIAGNOSIS — I10 Essential (primary) hypertension: Secondary | ICD-10-CM | POA: Diagnosis not present

## 2023-12-22 DIAGNOSIS — Z01818 Encounter for other preprocedural examination: Secondary | ICD-10-CM | POA: Diagnosis not present

## 2023-12-22 DIAGNOSIS — K219 Gastro-esophageal reflux disease without esophagitis: Secondary | ICD-10-CM | POA: Diagnosis not present

## 2023-12-22 DIAGNOSIS — Z01810 Encounter for preprocedural cardiovascular examination: Secondary | ICD-10-CM | POA: Diagnosis present

## 2023-12-22 DIAGNOSIS — G35 Multiple sclerosis: Secondary | ICD-10-CM | POA: Insufficient documentation

## 2023-12-22 DIAGNOSIS — J45909 Unspecified asthma, uncomplicated: Secondary | ICD-10-CM | POA: Insufficient documentation

## 2023-12-22 DIAGNOSIS — Z01812 Encounter for preprocedural laboratory examination: Secondary | ICD-10-CM | POA: Diagnosis present

## 2023-12-22 DIAGNOSIS — Z79899 Other long term (current) drug therapy: Secondary | ICD-10-CM | POA: Diagnosis not present

## 2023-12-22 HISTORY — DX: Gastro-esophageal reflux disease without esophagitis: K21.9

## 2023-12-22 HISTORY — DX: Cardiac arrhythmia, unspecified: I49.9

## 2023-12-22 HISTORY — DX: Bipolar disorder, unspecified: F31.9

## 2023-12-22 LAB — CBC
HCT: 37.9 % (ref 36.0–46.0)
Hemoglobin: 12.3 g/dL (ref 12.0–15.0)
MCH: 31.5 pg (ref 26.0–34.0)
MCHC: 32.5 g/dL (ref 30.0–36.0)
MCV: 97.2 fL (ref 80.0–100.0)
Platelets: 300 K/uL (ref 150–400)
RBC: 3.9 MIL/uL (ref 3.87–5.11)
RDW: 12.9 % (ref 11.5–15.5)
WBC: 6.4 K/uL (ref 4.0–10.5)
nRBC: 0 % (ref 0.0–0.2)

## 2023-12-22 LAB — BASIC METABOLIC PANEL WITH GFR
Anion gap: 9 (ref 5–15)
BUN: 5 mg/dL — ABNORMAL LOW (ref 6–20)
CO2: 24 mmol/L (ref 22–32)
Calcium: 9.5 mg/dL (ref 8.9–10.3)
Chloride: 105 mmol/L (ref 98–111)
Creatinine, Ser: 0.76 mg/dL (ref 0.44–1.00)
GFR, Estimated: >60 mL/min (ref >60)
Glucose, Bld: 87 mg/dL (ref 70–99)
Potassium: 4 mmol/L (ref 3.5–5.1)
Sodium: 138 mmol/L (ref 135–145)

## 2023-12-22 NOTE — Progress Notes (Signed)
 PCP - Dr. Madelin Brought, but pt planning on moving to a previous PCP that is relocating to [facility] with [place]. Cardiologist - Denies Hematologist - Dr. Bernardino Ill - Usually see's Tinnie Frederick, PA-C - for Von Willebrand Disorder at [facility]  PPM/ICD - Denies Device Orders - n/a Rep Notified - n/a  Chest x-ray - n/a EKG - 12/22/2023 Stress Test - Denies ECHO - Denies Cardiac Cath - Denies  Sleep Study - Denies - Pt said she had a sleep study that came back inconclusive and never followed up after.  CPAP - n/a  No DM  Last dose of GLP1 agonist- Last dose of Semaglutide was August 3rd GLP1 instructions: Pt instructed to continue to hold medication until after surgery  Blood Thinner Instructions: n/a Aspirin Instructions: n/a  NPO after midnight  COVID TEST- n/a   Anesthesia review: Yes. Hx of Von Willebrand Disorder. Pre/Post operative plan already established and placed in chart. Medication has already been ordered for pt to receive prior to surgery.    Patient denies shortness of breath, fever, cough and chest pain at PAT appointment. Pt denies any respiratory illness/infection in the last two months.    All instructions explained to the patient, with a verbal understanding of the material. Patient agrees to go over the instructions while at home for a better understanding. Patient also instructed to self quarantine after being tested for COVID-19. The opportunity to ask questions was provided.

## 2023-12-23 NOTE — Progress Notes (Signed)
 Anesthesia Chart Review:  43 year old female follows with hematologist Dr. Milissa at [facility] for history of von Willebrand's disorder.  Per note by [place], [facility] on 11/29/2023 periprocedural management plan has been communicated to [facility] health.  Note states: -Bleeding disorder: VWD -Scheduled for hysterectomy by Dr. Mat on 12/27/2023 -Current weight: 84.2 kg (11/16/2023)    Pre-operative plan 30-60 minutes prior to incision, please administer 3600 units or approximately 40 units/kg (+/-10% depending on available vial size) of EITHER VONVENDI  (recombinant VWF) OR HUMATE-P  (plasma derived CTQ:QCPPP) IV at a maximum rate of 4 ml/min in pre-operative holding room. [facility] department of pharmacy has been notified and will have adequate stock on hand to provide pre/post op dosing. If Humate-P  is used, dosing should be based on VWF:Rco (NOT FVIII). Post-operative plan Vonvendi  on POD1-7 (starting 24 hours after procedure): 1800 units daily (20 units/kg; +/-10% depending on available vial size) for 7 days. May dose 1800 units (~20 units/kg) AS NEEDED on POD8-10 for bleeding. Please contact the hemophilia treatment center if as needed doses are needed.   Patient will require home health nursing to administer home doses, which will be arranged by hemophilia treatment center team.   Plan was drafted with Dr. Bernardino Milissa and Tinnie Frederick, PA-C. Please do not hesitate to reach out with any questions regarding plan.   For non-urgent questions, please call Tinnie Frederick [phone]) PA for hemophilia center, coordinator of Hemophilia Treatment Center, or for urgent issues contact hemophilia attending Dr Milissa through [facility] line at [facility] [phone]).    Other pertinent history includes PONV, class II obesity BMI 33, GERD, depression, bipolar disorder, ADD, asthma, headaches, HTN, relapsing/remitting multiple sclerosis diagnosed 2020-maintained on Briumvi infusions, s/p  laparoscopic sleeve gastrectomy 11/2020.  Review of anesthesia records in Care Everywhere shows that Glide scope was used electively for intubation 11/24/2020.  No difficulty was noted.  Patient reports last dose semaglutide on 12/11/2023.  Preop labs reviewed, unremarkable.  EKG 12/22/2023: NSR.  Rate 82.   Lynwood Geofm RIGGERS [facility] Short Stay Center/Anesthesiology Phone [phone] 12/23/2023 12:02 PM

## 2023-12-23 NOTE — Anesthesia Preprocedure Evaluation (Signed)
 Anesthesia Evaluation  Patient identified by MRN, date of birth, ID band Patient awake    Reviewed: Allergy & Precautions, NPO status , Patient's Chart, lab work & pertinent test results, reviewed documented beta blocker date and time   History of Anesthesia Complications (+) PONV, Family history of anesthesia reaction and history of anesthetic complications  Airway Mallampati: III  TM Distance: >3 FB     Dental no notable dental hx.    Pulmonary neg shortness of breath, asthma (childhood) , neg sleep apnea, pneumonia, neg recent URI, former smoker   breath sounds clear to auscultation       Cardiovascular hypertension, (-) CAD, (-) Past MI, (-) Cardiac Stents and (-) CABG + dysrhythmias  Rhythm:Regular Rate:Normal     Neuro/Psych  Headaches, neg Seizures PSYCHIATRIC DISORDERS Anxiety Depression Bipolar Disorder   Multiple sclerosis    GI/Hepatic PUD,GERD  ,,(+) neg Cirrhosis        Endo/Other    Renal/GU Renal disease     Musculoskeletal  (+) Arthritis ,    Abdominal   Peds  Hematology  (+) Blood dyscrasia, anemia VWD   Anesthesia Other Findings   Reproductive/Obstetrics                              Anesthesia Physical Anesthesia Plan  ASA: 3  Anesthesia Plan: General   Post-op Pain Management:    Induction: Intravenous  PONV Risk Score and Plan: 3 and Ondansetron  and TIVA  Airway Management Planned: Oral ETT  Additional Equipment:   Intra-op Plan:   Post-operative Plan: Extubation in OR  Informed Consent: I have reviewed the patients History and Physical, chart, labs and discussed the procedure including the risks, benefits and alternatives for the proposed anesthesia with the patient or authorized representative who has indicated his/her understanding and acceptance.     Dental advisory given  Plan Discussed with: CRNA  Anesthesia Plan Comments: (PAT note by  Lynwood Hope, PA-C: 43 year old female follows with hematologist Dr. Milissa at [facility] for history of von Willebrand's disorder.  Per note by [place], [facility] on 11/29/2023 periprocedural management plan has been communicated to [facility].  Note states: -Bleeding disorder: VWD -Scheduled for hysterectomy by Dr. Mat on 12/27/2023 -Current weight: 84.2 kg (11/16/2023)   1. Pre-operative plan  1. 30-60 minutes prior to incision, please administer 3600 units or approximately 40 units/kg (+/-10% depending on available vial size) of EITHER VONVENDI  (recombinant VWF) OR HUMATE-P  (plasma derived CTQ:QCPPP) IV at a maximum rate of 4 ml/min in pre-operative holding room. [facility] department of pharmacy has been notified and will have adequate stock on hand to provide pre/post op dosing. If Humate-P  is used, dosing should be based on VWF:Rco (NOT FVIII). 2. Post-operative plan  1. Vonvendi  on POD1-7 (starting 24 hours after procedure): 1800 units daily (20 units/kg; +/-10% depending on available vial size) for 7 days.  2. May dose 1800 units (~20 units/kg) AS NEEDED on POD8-10 for bleeding. Please contact the hemophilia treatment center if as needed doses are needed.  Patient will require home health nursing to administer home doses, which will be arranged by hemophilia treatment center team.  Plan was drafted with Dr. Bernardino Milissa and Tinnie Frederick, PA-C. Please do not hesitate to reach out with any questions regarding plan.  For non-urgent questions, please call Tinnie Frederick [phone]) PA for hemophilia center, coordinator of Hemophilia Treatment Center, or for urgent issues contact hemophilia attending Dr Milissa  through [facility] line at [facility] [phone]).    Other pertinent history includes PONV, class II obesity BMI 33, GERD, depression, bipolar disorder, ADD, asthma, headaches, HTN, relapsing/remitting multiple sclerosis diagnosed 2020-maintained on Briumvi  infusions, s/p laparoscopic sleeve gastrectomy 11/2020.  Review of anesthesia records in Care Everywhere shows that Glide scope was used electively for intubation 11/24/2020.  No difficulty was noted.  Patient reports last dose semaglutide on 12/11/2023.  Preop labs reviewed, unremarkable.  EKG 12/22/2023: NSR.  Rate 82.   )         Anesthesia Quick Evaluation

## 2023-12-26 ENCOUNTER — Encounter (HOSPITAL_COMMUNITY): Payer: Self-pay | Admitting: Obstetrics and Gynecology

## 2023-12-27 ENCOUNTER — Ambulatory Visit (HOSPITAL_COMMUNITY): Admitting: Physician Assistant

## 2023-12-27 ENCOUNTER — Inpatient Hospital Stay (HOSPITAL_COMMUNITY)
Admission: AD | Admit: 2023-12-27 | Discharge: 2024-01-01 | DRG: 982 | Disposition: A | Discharge status: Home / Self Care | Attending: Obstetrics and Gynecology | Admitting: Obstetrics and Gynecology

## 2023-12-27 ENCOUNTER — Other Ambulatory Visit: Payer: Self-pay

## 2023-12-27 ENCOUNTER — Encounter (HOSPITAL_COMMUNITY): Payer: Self-pay | Admitting: Obstetrics and Gynecology

## 2023-12-27 ENCOUNTER — Encounter (HOSPITAL_COMMUNITY)
Admission: AD | Disposition: A | Discharge status: Home / Self Care | Payer: Self-pay | Source: Home / Self Care | Attending: Obstetrics and Gynecology

## 2023-12-27 DIAGNOSIS — I10 Essential (primary) hypertension: Secondary | ICD-10-CM | POA: Diagnosis not present

## 2023-12-27 DIAGNOSIS — Z825 Family history of asthma and other chronic lower respiratory diseases: Secondary | ICD-10-CM

## 2023-12-27 DIAGNOSIS — Y838 Other surgical procedures as the cause of abnormal reaction of the patient, or of later complication, without mention of misadventure at the time of the procedure: Secondary | ICD-10-CM | POA: Diagnosis present

## 2023-12-27 DIAGNOSIS — Z87891 Personal history of nicotine dependence: Secondary | ICD-10-CM

## 2023-12-27 DIAGNOSIS — N736 Female pelvic peritoneal adhesions (postinfective): Secondary | ICD-10-CM | POA: Diagnosis present

## 2023-12-27 DIAGNOSIS — Z981 Arthrodesis status: Secondary | ICD-10-CM

## 2023-12-27 DIAGNOSIS — Z90710 Acquired absence of both cervix and uterus: Secondary | ICD-10-CM | POA: Diagnosis present

## 2023-12-27 DIAGNOSIS — K219 Gastro-esophageal reflux disease without esophagitis: Secondary | ICD-10-CM | POA: Diagnosis present

## 2023-12-27 DIAGNOSIS — D251 Intramural leiomyoma of uterus: Secondary | ICD-10-CM

## 2023-12-27 DIAGNOSIS — Z9884 Bariatric surgery status: Secondary | ICD-10-CM

## 2023-12-27 DIAGNOSIS — F988 Other specified behavioral and emotional disorders with onset usually occurring in childhood and adolescence: Secondary | ICD-10-CM | POA: Diagnosis present

## 2023-12-27 DIAGNOSIS — D62 Acute posthemorrhagic anemia: Principal | ICD-10-CM | POA: Diagnosis present

## 2023-12-27 DIAGNOSIS — R102 Pelvic and perineal pain: Principal | ICD-10-CM | POA: Diagnosis present

## 2023-12-27 DIAGNOSIS — K9189 Other postprocedural complications and disorders of digestive system: Secondary | ICD-10-CM | POA: Diagnosis present

## 2023-12-27 DIAGNOSIS — Z6833 Body mass index (BMI) 33.0-33.9, adult: Secondary | ICD-10-CM

## 2023-12-27 DIAGNOSIS — I2510 Atherosclerotic heart disease of native coronary artery without angina pectoris: Secondary | ICD-10-CM

## 2023-12-27 DIAGNOSIS — Z98891 History of uterine scar from previous surgery: Secondary | ICD-10-CM

## 2023-12-27 DIAGNOSIS — E66812 Obesity, class 2: Secondary | ICD-10-CM | POA: Diagnosis present

## 2023-12-27 DIAGNOSIS — G35 Multiple sclerosis: Secondary | ICD-10-CM | POA: Diagnosis present

## 2023-12-27 DIAGNOSIS — K567 Ileus, unspecified: Secondary | ICD-10-CM | POA: Diagnosis present

## 2023-12-27 DIAGNOSIS — Z818 Family history of other mental and behavioral disorders: Secondary | ICD-10-CM

## 2023-12-27 DIAGNOSIS — N920 Excessive and frequent menstruation with regular cycle: Secondary | ICD-10-CM | POA: Diagnosis present

## 2023-12-27 DIAGNOSIS — Z8249 Family history of ischemic heart disease and other diseases of the circulatory system: Secondary | ICD-10-CM

## 2023-12-27 DIAGNOSIS — Z823 Family history of stroke: Secondary | ICD-10-CM

## 2023-12-27 DIAGNOSIS — F418 Other specified anxiety disorders: Secondary | ICD-10-CM

## 2023-12-27 DIAGNOSIS — Z8261 Family history of arthritis: Secondary | ICD-10-CM

## 2023-12-27 DIAGNOSIS — D6800 Von Willebrand disease, unspecified: Secondary | ICD-10-CM | POA: Diagnosis present

## 2023-12-27 DIAGNOSIS — Z9049 Acquired absence of other specified parts of digestive tract: Secondary | ICD-10-CM

## 2023-12-27 HISTORY — PX: HYSTERECTOMY ABDOMINAL WITH SALPINGECTOMY: SHX6725

## 2023-12-27 LAB — POCT PREGNANCY, URINE: Preg Test, Ur: NEGATIVE

## 2023-12-27 SURGERY — HYSTERECTOMY, TOTAL, ABDOMINAL, WITH SALPINGECTOMY
Anesthesia: General | Laterality: Bilateral

## 2023-12-27 MED ORDER — POLYETHYLENE GLYCOL 3350 17 G PO PACK: 17.0000 g | PACK | Freq: Every day | ORAL | Status: DC | PRN

## 2023-12-27 MED ORDER — ONDANSETRON HCL 4 MG/2ML IJ SOLN
4.0000 mg | Freq: Four times a day (QID) | INTRAMUSCULAR | Status: DC | PRN
  Administered 2023-12-27: 4 mg via INTRAVENOUS
  Filled 2023-12-27: qty 2

## 2023-12-27 MED ORDER — ANTIHEMOPHILIC FACTOR-VWF 250-600 UNITS IV SOLR
3652.0000 [IU] | Freq: Once | INTRAVENOUS | Status: AC
  Administered 2023-12-28: 3652 [IU] via INTRAVENOUS
  Filled 2023-12-27: qty 523

## 2023-12-27 MED ORDER — HYDROMORPHONE HCL 1 MG/ML IJ SOLN
0.2500 mg | INTRAMUSCULAR | Status: DC | PRN
  Administered 2023-12-27 (×4): 0.5 mg via INTRAVENOUS

## 2023-12-27 MED ORDER — ONDANSETRON HCL 4 MG PO TABS
4.0000 mg | ORAL_TABLET | Freq: Four times a day (QID) | ORAL | Status: DC | PRN
  Administered 2023-12-29: 4 mg via ORAL
  Filled 2023-12-27: qty 1

## 2023-12-27 MED ORDER — FENTANYL CITRATE (PF) 250 MCG/5ML IJ SOLN
INTRAMUSCULAR | Status: DC | PRN
  Administered 2023-12-27: 100 ug via INTRAVENOUS
  Administered 2023-12-27 (×2): 50 ug via INTRAVENOUS
  Administered 2023-12-27: 5 ug via INTRAVENOUS

## 2023-12-27 MED ORDER — SODIUM CHLORIDE 0.9 % IV SOLN
2.0000 g | INTRAVENOUS | Status: AC
  Administered 2023-12-27: 2 g via INTRAVENOUS

## 2023-12-27 MED ORDER — FLUOXETINE HCL 20 MG PO CAPS
60.0000 mg | ORAL_CAPSULE | Freq: Every morning | ORAL | Status: DC
  Administered 2023-12-31: 60 mg via ORAL
  Filled 2023-12-27 (×5): qty 3

## 2023-12-27 MED ORDER — FENTANYL CITRATE (PF) 100 MCG/2ML IJ SOLN
INTRAMUSCULAR | Status: AC
  Filled 2023-12-27: qty 2

## 2023-12-27 MED ORDER — FENTANYL CITRATE (PF) 100 MCG/2ML IJ SOLN
25.0000 ug | INTRAMUSCULAR | Status: DC | PRN
  Administered 2023-12-27 (×3): 50 ug via INTRAVENOUS

## 2023-12-27 MED ORDER — HYDROMORPHONE HCL 2 MG PO TABS
2.0000 mg | ORAL_TABLET | ORAL | Status: DC | PRN
  Administered 2023-12-28 – 2024-01-01 (×4): 2 mg via ORAL
  Filled 2023-12-27 (×4): qty 1

## 2023-12-27 MED ORDER — HYDROMORPHONE HCL 1 MG/ML IJ SOLN
0.2000 mg | INTRAMUSCULAR | Status: DC | PRN
  Administered 2023-12-27 (×2): 0.6 mg via INTRAVENOUS
  Administered 2023-12-27: 0.5 mg via INTRAVENOUS
  Administered 2023-12-27 – 2023-12-31 (×19): 0.6 mg via INTRAVENOUS
  Filled 2023-12-27 (×24): qty 1

## 2023-12-27 MED ORDER — MENTHOL 3 MG MT LOZG
1.0000 | LOZENGE | OROMUCOSAL | Status: DC | PRN
Start: 2023-12-27 — End: 2024-01-01

## 2023-12-27 MED ORDER — ACETAMINOPHEN 10 MG/ML IV SOLN
INTRAVENOUS | Status: AC
Start: 2023-12-27 — End: 2023-12-27
  Filled 2023-12-27: qty 100

## 2023-12-27 MED ORDER — DEXMEDETOMIDINE HCL IN NACL 200 MCG/50ML IV SOLN
INTRAVENOUS | Status: DC | PRN
  Administered 2023-12-27: 8 ug via INTRAVENOUS

## 2023-12-27 MED ORDER — PROPOFOL 10 MG/ML IV BOLUS
INTRAVENOUS | Status: AC
  Filled 2023-12-27: qty 20

## 2023-12-27 MED ORDER — ANTIHEMOPHILIC FACTOR-VWF 250-600 UNITS IV SOLR
3544.0000 [IU] | Freq: Once | INTRAVENOUS | Status: AC
  Administered 2023-12-27: 3544 [IU] via INTRAVENOUS
  Filled 2023-12-27: qty 0

## 2023-12-27 MED ORDER — DIPHENHYDRAMINE HCL 50 MG/ML IJ SOLN
25.0000 mg | Freq: Once | INTRAMUSCULAR | Status: AC
  Administered 2023-12-27: 25 mg via INTRAVENOUS

## 2023-12-27 MED ORDER — PROPOFOL 10 MG/ML IV BOLUS
INTRAVENOUS | Status: DC | PRN
  Administered 2023-12-27: 100 ug/kg/min via INTRAVENOUS
  Administered 2023-12-27: 170 mg via INTRAVENOUS

## 2023-12-27 MED ORDER — FENTANYL CITRATE (PF) 250 MCG/5ML IJ SOLN
INTRAMUSCULAR | Status: AC
  Filled 2023-12-27: qty 5

## 2023-12-27 MED ORDER — ORAL CARE MOUTH RINSE: 15.0000 mL | Freq: Once | OROMUCOSAL | Status: AC

## 2023-12-27 MED ORDER — BUPIVACAINE HCL (PF) 0.25 % IJ SOLN
INTRAMUSCULAR | Status: DC | PRN
  Administered 2023-12-27: 30 mg

## 2023-12-27 MED ORDER — SUGAMMADEX SODIUM 200 MG/2ML IV SOLN
INTRAVENOUS | Status: DC | PRN
  Administered 2023-12-27: 200 mg via INTRAVENOUS

## 2023-12-27 MED ORDER — DOCUSATE SODIUM 100 MG PO CAPS
100.0000 mg | ORAL_CAPSULE | Freq: Two times a day (BID) | ORAL | Status: DC
  Administered 2023-12-27 – 2024-01-01 (×10): 100 mg via ORAL
  Filled 2023-12-27 (×9): qty 1

## 2023-12-27 MED ORDER — KETOROLAC TROMETHAMINE 15 MG/ML IJ SOLN
INTRAMUSCULAR | Status: AC
  Filled 2023-12-27: qty 1

## 2023-12-27 MED ORDER — POVIDONE-IODINE 10 % EX SWAB: 2.0000 | Freq: Once | CUTANEOUS | Status: DC

## 2023-12-27 MED ORDER — CHLORHEXIDINE GLUCONATE 0.12 % MT SOLN: 15.0000 mL | Freq: Once | OROMUCOSAL | Status: AC

## 2023-12-27 MED ORDER — ROCURONIUM BROMIDE 10 MG/ML (PF) SYRINGE
PREFILLED_SYRINGE | INTRAVENOUS | Status: DC | PRN
  Administered 2023-12-27: 10 mg via INTRAVENOUS
  Administered 2023-12-27: 60 mg via INTRAVENOUS

## 2023-12-27 MED ORDER — LACTATED RINGERS IV BOLUS
1000.0000 mL | Freq: Once | INTRAVENOUS | Status: AC
  Administered 2023-12-27: 1000 mL via INTRAVENOUS

## 2023-12-27 MED ORDER — DIPHENHYDRAMINE HCL 50 MG/ML IJ SOLN
INTRAMUSCULAR | Status: AC
  Filled 2023-12-27: qty 1

## 2023-12-27 MED ORDER — ONDANSETRON HCL 4 MG/2ML IJ SOLN: 4.0000 mg | Freq: Once | INTRAMUSCULAR | Status: DC | PRN

## 2023-12-27 MED ORDER — HYDROMORPHONE HCL 1 MG/ML IJ SOLN
INTRAMUSCULAR | Status: AC
Start: 2023-12-27 — End: 2023-12-27
  Filled 2023-12-27: qty 1

## 2023-12-27 MED ORDER — OXYCODONE HCL 5 MG PO TABS
5.0000 mg | ORAL_TABLET | Freq: Once | ORAL | Status: AC | PRN
  Administered 2023-12-27: 5 mg via ORAL

## 2023-12-27 MED ORDER — MIDAZOLAM HCL 2 MG/2ML IJ SOLN
INTRAMUSCULAR | Status: AC
  Filled 2023-12-27: qty 2

## 2023-12-27 MED ORDER — OXYCODONE HCL 5 MG PO TABS
5.0000 mg | ORAL_TABLET | ORAL | Status: DC | PRN
  Administered 2023-12-27: 5 mg via ORAL

## 2023-12-27 MED ORDER — DEXTROSE IN LACTATED RINGERS 5 % IV SOLN: INTRAVENOUS | Status: AC

## 2023-12-27 MED ORDER — METHOCARBAMOL 750 MG PO TABS
750.0000 mg | ORAL_TABLET | Freq: Four times a day (QID) | ORAL | Status: DC
  Administered 2023-12-27 – 2024-01-01 (×18): 750 mg via ORAL
  Filled 2023-12-27 (×22): qty 1

## 2023-12-27 MED ORDER — BUPIVACAINE HCL (PF) 0.25 % IJ SOLN: INTRAMUSCULAR | Status: DC | PRN

## 2023-12-27 MED ORDER — ALBUTEROL SULFATE (2.5 MG/3ML) 0.083% IN NEBU: 3.0000 mL | INHALATION_SOLUTION | Freq: Four times a day (QID) | RESPIRATORY_TRACT | Status: DC | PRN

## 2023-12-27 MED ORDER — KETOROLAC TROMETHAMINE 15 MG/ML IJ SOLN
15.0000 mg | Freq: Once | INTRAMUSCULAR | Status: AC
  Administered 2023-12-27: 15 mg via INTRAVENOUS

## 2023-12-27 MED ORDER — SODIUM CHLORIDE 0.9 % IV SOLN
INTRAVENOUS | Status: AC
  Filled 2023-12-27: qty 2

## 2023-12-27 MED ORDER — MIDAZOLAM HCL 2 MG/2ML IJ SOLN
INTRAMUSCULAR | Status: DC | PRN
  Administered 2023-12-27: 2 mg via INTRAVENOUS

## 2023-12-27 MED ORDER — CHLORHEXIDINE GLUCONATE 0.12 % MT SOLN
OROMUCOSAL | Status: AC
  Administered 2023-12-27: 15 mL via OROMUCOSAL
  Filled 2023-12-27: qty 15

## 2023-12-27 MED ORDER — ACETAMINOPHEN 10 MG/ML IV SOLN
INTRAVENOUS | Status: DC | PRN
  Administered 2023-12-27: 1000 mg via INTRAVENOUS

## 2023-12-27 MED ORDER — OXYCODONE HCL 5 MG PO TABS
ORAL_TABLET | ORAL | Status: AC
Start: 2023-12-27 — End: 2023-12-27
  Administered 2023-12-27: 5 mg
  Filled 2023-12-27: qty 1

## 2023-12-27 MED ORDER — AMLODIPINE BESYLATE 5 MG PO TABS
5.0000 mg | ORAL_TABLET | Freq: Every morning | ORAL | Status: DC
  Administered 2023-12-31 – 2024-01-01 (×2): 5 mg via ORAL
  Filled 2023-12-27 (×2): qty 1

## 2023-12-27 MED ORDER — DEXAMETHASONE SODIUM PHOSPHATE 10 MG/ML IJ SOLN
INTRAMUSCULAR | Status: DC | PRN
  Administered 2023-12-27: 10 mg via INTRAVENOUS

## 2023-12-27 MED ORDER — HEMOSTATIC AGENTS (NO CHARGE) OPTIME
TOPICAL | Status: DC | PRN
  Administered 2023-12-27: 1 via TOPICAL

## 2023-12-27 MED ORDER — BUPIVACAINE HCL (PF) 0.25 % IJ SOLN
INTRAMUSCULAR | Status: AC
  Filled 2023-12-27: qty 30

## 2023-12-27 MED ORDER — ACETAMINOPHEN 10 MG/ML IV SOLN: 1000.0000 mg | Freq: Once | INTRAVENOUS | Status: DC | PRN

## 2023-12-27 MED ORDER — LIDOCAINE 2% (20 MG/ML) 5 ML SYRINGE
INTRAMUSCULAR | Status: DC | PRN
  Administered 2023-12-27: 30 mg via INTRAVENOUS

## 2023-12-27 MED ORDER — ONDANSETRON HCL 4 MG/2ML IJ SOLN
INTRAMUSCULAR | Status: DC | PRN
  Administered 2023-12-27: 4 mg via INTRAVENOUS

## 2023-12-27 MED ORDER — DIPHENHYDRAMINE HCL 25 MG PO CAPS
25.0000 mg | ORAL_CAPSULE | Freq: Four times a day (QID) | ORAL | Status: DC | PRN
  Administered 2023-12-27 – 2024-01-01 (×4): 25 mg via ORAL
  Filled 2023-12-27 (×4): qty 1

## 2023-12-27 MED ORDER — LACTATED RINGERS IV SOLN: INTRAVENOUS | Status: DC

## 2023-12-27 MED ORDER — DEXTROSE IN LACTATED RINGERS 5 % IV SOLN: INTRAVENOUS | Status: DC

## 2023-12-27 MED ORDER — OXYCODONE HCL 5 MG/5ML PO SOLN: 5.0000 mg | Freq: Once | ORAL | Status: AC | PRN

## 2023-12-27 MED ORDER — HYDROMORPHONE HCL 1 MG/ML IJ SOLN
INTRAMUSCULAR | Status: AC
  Filled 2023-12-27: qty 1

## 2023-12-27 MED ORDER — OXYCODONE HCL 5 MG PO TABS
ORAL_TABLET | ORAL | Status: AC
  Filled 2023-12-27: qty 1

## 2023-12-27 MED FILL — Antihemophilic Factor/VWF (Human) For Inj 250-600 Unit: INTRAVENOUS | Qty: 3544 | Status: CN

## 2023-12-27 SURGICAL SUPPLY — 32 items
BENZOIN TINCTURE PRP APPL 2/3 (GAUZE/BANDAGES/DRESSINGS) ×1 IMPLANT
COVER MAYO STAND STRL (DRAPES) ×1 IMPLANT
DRAPE CESAREAN BIRTH W POUCH (DRAPES) ×1 IMPLANT
DRAPE WARM FLUID 44X44 (DRAPES) ×1 IMPLANT
DRSG OPSITE POSTOP 4X10 (GAUZE/BANDAGES/DRESSINGS) ×1 IMPLANT
DURAPREP 26ML APPLICATOR (WOUND CARE) ×1 IMPLANT
GAUZE 4X4 16PLY ~~LOC~~+RFID DBL (SPONGE) ×1 IMPLANT
GLOVE BIO SURGEON STRL SZ 6.5 (GLOVE) ×1 IMPLANT
GLOVE SURG UNDER POLY LF SZ7 (GLOVE) ×2 IMPLANT
GOWN STRL REUS W/ TWL LRG LVL3 (GOWN DISPOSABLE) ×1 IMPLANT
HEMOSTAT ARISTA ABSORB 3G PWDR (HEMOSTASIS) IMPLANT
HIBICLENS CHG 4% 4OZ BTL (MISCELLANEOUS) ×1 IMPLANT
KIT TURNOVER KIT B (KITS) ×1 IMPLANT
NDL HYPO 22X1.5 SAFETY MO (MISCELLANEOUS) ×1 IMPLANT
NEEDLE HYPO 22X1.5 SAFETY MO (MISCELLANEOUS) ×1 IMPLANT
NS IRRIG 1000ML POUR BTL (IV SOLUTION) ×1 IMPLANT
PACK ABDOMINAL GYN (CUSTOM PROCEDURE TRAY) ×1 IMPLANT
PAD OB MATERNITY 11 LF (PERSONAL CARE ITEMS) ×1 IMPLANT
SPIKE FLUID TRANSFER (MISCELLANEOUS) IMPLANT
SPONGE T-LAP 18X18 ~~LOC~~+RFID (SPONGE) IMPLANT
STRIP CLOSURE SKIN 1/2X4 (GAUZE/BANDAGES/DRESSINGS) ×1 IMPLANT
SUT PDS AB 0 CT 36 (SUTURE) IMPLANT
SUT PDS AB 0 CTX 60 (SUTURE) IMPLANT
SUT PLAIN ABS 2-0 CT1 27XMFL (SUTURE) IMPLANT
SUT VIC AB 0 CT1 18XCR BRD8 (SUTURE) ×2 IMPLANT
SUT VIC AB 0 CT1 27XBRD ANBCTR (SUTURE) ×4 IMPLANT
SUT VIC AB 2-0 CT1 TAPERPNT 27 (SUTURE) IMPLANT
SUT VIC AB 4-0 KS 27 (SUTURE) ×1 IMPLANT
SUT VICRYL 0 TIES 12 18 (SUTURE) ×1 IMPLANT
SYR CONTROL 10ML LL (SYRINGE) ×1 IMPLANT
TOWEL GREEN STERILE FF (TOWEL DISPOSABLE) ×1 IMPLANT
TRAY FOLEY W/BAG SLVR 14FR (SET/KITS/TRAYS/PACK) ×1 IMPLANT

## 2023-12-27 NOTE — Transfer of Care (Signed)
 Immediate Anesthesia Transfer of Care Note  Patient: Ann [name]  Procedure(s) Performed: HYSTERECTOMY, TOTAL, ABDOMINAL, WITH SALPINGECTOMY (Bilateral)  Patient Location: PACU  Anesthesia Type:General  Level of Consciousness: awake, alert , and oriented  Airway & Oxygen Therapy: Patient Spontanous Breathing  Post-op Assessment: Report given to RN and Post -op Vital signs reviewed and stable  Post vital signs: Reviewed and stable  Last Vitals:  Vitals Value Taken Time  BP 116/80 12/27/23 09:15  Temp 36.4 C 12/27/23 09:09  Pulse 74 12/27/23 09:17  Resp 14 12/27/23 09:17  SpO2 93 % 12/27/23 09:17  Vitals shown include unfiled device data.  Last Pain:  Vitals:   12/27/23 0609  TempSrc:   PainSc: 0-No pain      Patients Stated Pain Goal: 0 (12/27/23 9390)  Complications: No notable events documented.

## 2023-12-27 NOTE — Op Note (Unsigned)
 NAME: LINDSEY-EVANS, Breezy N. MEDICAL RECORD NO: [id number] ACCOUNT NO: [id number] DATE OF BIRTH: [birth year] FACILITY: [facility] LOCATION: [facility] PHYSICIAN: Tymeer Vaquera L. Mat, MD  Operative Report   DATE OF PROCEDURE: 12/27/2023  PREOPERATIVE DIAGNOSES: 1.  Menorrhagia. 2.  History of von Willebrand's disease. 3.  Pelvic pain.  POSTOPERATIVE DIAGNOSES: 1.  Menorrhagia. 2.  History of von Willebrand's disease. 3.  Pelvic pain.  PROCEDURE:  Total abdominal hysterectomy and bilateral salpingectomy and extensive lysis of adhesions.  SURGEON:  Jaken Fregia L. Mat, MD.  ASSISTANT:  Dr. Norleen Skill.  ANESTHESIA:  General.  ESTIMATED BLOOD LOSS:  Per anesthesia record.  COMPLICATIONS:  None.  DRAINS:  Foley catheter.  PATHOLOGY:  Cervix, uterus, and fallopian tubes.  DESCRIPTION OF PROCEDURE:  The patient was taken to the operating room after informed consent was obtained.  She was intubated without difficulty with general anesthesia.  She was prepped and draped, and a Foley catheter was inserted, and timeout was  performed.  Foley catheter was draining clear urine.  A low transverse incision was made, carried down to the fascia.  Fascia scored in the midline and extended laterally.  The rectus muscles were separated from the fascia, and the peritoneum was gently  entered using Metzenbaum scissors.  Upon entry into the peritoneal cavity, we identified that the uterus was adherent to the anterior abdominal wall, most likely from her cesarean section.  We grasped the uterus with *** tenaculum.  We were able to  elevate the uterus, and then by using careful sharp and blunt dissection, we were able to dissect the uterus from the anterior abdominal wall with Metzenbaum scissors.  At this point, we then identified a very thick band involving the lower uterine  segment and anterior abdominal wall.  I placed a clamp across that to divide the band.  It was cut and then suture ligated using 0 Vicryl  suture.  We then identified and were then able to visualize the right round ligament.  The round ligament was then  suture ligated with 0 Vicryl suture.  We then placed curved Haney clamps across the triple pedicle on the right, and the pedicles clamped, cut, and suture ligated using 0 Vicryl suture.  We then turned our attention to the left side of the pelvis and  then ***   DICTATION ENDS HERE   PUS D: 12/27/2023 1:19:29 pm T: 12/27/2023 8:28:00 pm  JOB: [id number]/ [id number]

## 2023-12-27 NOTE — Anesthesia Procedure Notes (Signed)
 Anesthesia Regional Block: TAP block   Pre-Anesthetic Checklist: , timeout performed,  Correct Patient, Correct Site, Correct Laterality,  Correct Procedure, Correct Position, site marked,  Risks and benefits discussed,  Surgical consent,  Pre-op evaluation,  At surgeon's request and post-op pain management  Laterality: Left and Right  Prep: Maximum Sterile Barrier Precautions used, chloraprep       Needles:  Injection technique: Single-shot  Needle Type: Echogenic Needle      Needle Gauge: 20     Additional Needles:   Procedures:,,,, ultrasound used (permanent image in chart),,    Narrative:  Start time: 12/27/2023 11:50 AM End time: 12/27/2023 11:55 AM Injection made incrementally with aspirations every 5 mL.  Performed by: Personally  Anesthesiologist: Keneth Lynwood POUR, MD

## 2023-12-27 NOTE — Brief Op Note (Signed)
 12/27/2023  9:01 AM  PATIENT:  Ann [name]  42 y.o. female  PRE-OPERATIVE DIAGNOSIS:  MENORRHAGIA, FIBROIDS, von willebrand's   POST-OPERATIVE DIAGNOSIS:  MENORRHAGIA, FIBROIDS, same  PROCEDURE:  Procedure(s): HYSTERECTOMY, TOTAL, ABDOMINAL, WITH SALPINGECTOMY (Bilateral)  SURGEON:  Surgeons and Role:    * Mat Browning, MD - Primary  PHYSICIAN ASSISTANT:  Dr norleen Skill  ASSISTANTS: none   ANESTHESIA:   general  EBL:  per anesthesia record   BLOOD ADMINISTERED:none  DRAINS: Urinary Catheter (Foley)   LOCAL MEDICATIONS USED:  NONE  SPECIMEN:  Source of Specimen:  cervix uterus and tubes  DISPOSITION OF SPECIMEN:  PATHOLOGY  COUNTS:  YES  TOURNIQUET:  * No tourniquets in log *  DICTATION: .Other Dictation: Dictation Number dictated  PLAN OF CARE: Admit for overnight observation  PATIENT DISPOSITION:  PACU - hemodynamically stable.   Delay start of Pharmacological VTE agent (>24hrs) due to surgical blood loss or risk of bleeding: not applicable

## 2023-12-27 NOTE — Progress Notes (Signed)
 POD # 0  Pain is decreasing with the IV dilaudid .  BP 118/63 (BP Location: Right Arm)   Pulse 85   Temp 97.6 F (36.4 C)   Resp 18   Ht 5' 2 (1.575 m)   Wt 84.1 kg   LMP 12/02/2023 (Approximate)   SpO2 100%   BMI 33.91 kg/m  Results for orders placed or performed during the hospital encounter of 12/27/23 (from the past 24 hours)  Pregnancy, urine POC     Status: None   Collection Time: 12/27/23  6:16 AM  Result Value Ref Range   Preg Test, Ur NEGATIVE NEGATIVE   Abdomen is soft and non tender  Bandage small amount of drainage on the right and left corners   IMPRESSION: POD # 0  Doing well Change dressing Advance diet  Add robaxin  to pain medication schedule Humate p in the am Discontinue Foley

## 2023-12-27 NOTE — H&P (Signed)
 43 year old with menorrhagia, dysmenorrhea, and fibroids admitted for TAH/BS.  She has VWD and followed by hematology at [facility]. Recommendation for Humate P this am and daily for 8 - 10 days post op. She is just now receiving her infusion this am.   Past Medical History:  Diagnosis Date   ADD (attention deficit disorder)    takes Adderall daily   Anemia    Anxiety    takes Xanax  daily as needed   Arthritis    Asthma    Mild. Only flares up when sick   Bipolar disorder (HCC)    Blood dyscrasia    Von Willebrand   Chronic lower back pain    Clotting disorder (HCC)    Depression    has meds prescribed but doesn't take them   Dysrhythmia    Sinus Arrythmia   Family history of adverse reaction to anesthesia    Mom gets PONV too   GERD (gastroesophageal reflux disease)    Headache    monthly (02/27/2015)   Hemorrhoids    History of esophagogastroduodenoscopy (EGD)    Hypertension    Migraine    none in the last 6 months (02/27/2015)   Multiple sclerosis (HCC)    Nocturia    Pneumonia several times   PONV (postoperative nausea and vomiting)    Von Willebrand disease (HCC)    Dr. Freddie   Past Surgical History:  Procedure Laterality Date   BREAST CYST EXCISION Left 02/20/2018   Procedure: EXCISION OF LEFT BREAST SEBACEOUS CYST;  Surgeon: Ebbie Cough, MD;  Location: [facility];  Service: General;  Laterality: Left;   BREAST REDUCTION SURGERY Bilateral 02/27/2015   Procedure: BILATERAL BREAST REDUCTION WITH FREE NIPPLE GRAFT TECHNIQUE ON RIGHT BREAST;  Surgeon: Alm Sick, MD;  Location: [facility] OR;  Service: Plastics;  Laterality: Bilateral;   CESAREAN SECTION  2004; 2013   2004, 2013   CHOLECYSTECTOMY  03/02/2012   Procedure: LAPAROSCOPIC CHOLECYSTECTOMY WITH INTRAOPERATIVE CHOLANGIOGRAM;  Surgeon: Sherlean JINNY Laughter, MD;  Location: [facility] OR;  Service: General;  Laterality: N/A;   COLONOSCOPY     DILATION AND CURETTAGE OF UTERUS  ~ 1997; ~ 2005    DILITATION & CURRETTAGE/HYSTROSCOPY WITH THERMACHOICE ABLATION  04/28/2012   Procedure: DILATATION & CURETTAGE/HYSTEROSCOPY WITH THERMACHOICE ABLATION;  Surgeon: Rosaline LITTIE Cobble, MD;  Location: [facility] ORS;  Service: Gynecology;  Laterality: N/A;   ERCP  03/03/2012   Procedure: ENDOSCOPIC RETROGRADE CHOLANGIOPANCREATOGRAPHY (ERCP);  Surgeon: Belvie JONETTA Just, MD;  Location: [facility] ENDOSCOPY;  Service: Endoscopy;  Laterality: N/A;  dl/hung   LAPAROSCOPIC GASTRIC SLEEVE RESECTION     with [facility]   LAPAROSCOPY  2011   LUMBAR DISC SURGERY  2003; 2008; 2009   MULTIPLE TOOTH EXTRACTIONS  scattered dates   POSTERIOR LUMBAR FUSION  2010   TUBAL LIGATION  2013   WISDOM TOOTH EXTRACTION  2001   Social History   Socioeconomic History   Marital status: Married    Spouse name: Not on file   Number of children: Not on file   Years of education: Not on file   Highest education level: Not on file  Occupational History   Occupation: regulatory affairs  Tobacco Use   Smoking status: Former    Current packs/day: 0.00    Average packs/day: 0.3 packs/day for 2.0 years (0.5 ttl pk-yrs)    Types: Cigarettes    Start date: 68    Quit date: 2000    Years since quitting: 25.6  Smokeless tobacco: Never  Vaping Use   Vaping status: Never Used  Substance and Sexual Activity   Alcohol use: Yes    Alcohol/week: 0.0 standard drinks of alcohol    Comment: social   Drug use: Never   Sexual activity: Yes    Birth control/protection: None    Comment: BTL  Other Topics Concern   Not on file  Social History Narrative   Lives with husband and kids   Caffeine use:    16 oz per day   Right handed   Social Drivers of Corporate investment banker Strain: Not on file  Food Insecurity: Low Risk  (08/02/2023)   Received from [facility]   Hunger Vital Sign    Within the past 12 months, you worried that your food would run out before you got money to buy more: Never true    Within the past 12 months,  the food you bought just didn't last and you didn't have money to get more. : Never true  Transportation Needs: No Transportation Needs (08/02/2023)   Received from [facility]    In the past 12 months, has lack of reliable transportation kept you from medical appointments, meetings, work or from getting things needed for daily living? : No  Physical Activity: Not on file  Stress: Not on file  Social Connections: Not on file   Family History  Problem Relation Age of Onset   Colon polyps Mother    Stroke Mother    Arthritis Mother    Depression Mother    COPD Father    Kidney disease Paternal Uncle    Stroke Maternal Grandfather    Heart disease Paternal Grandfather    Multiple sclerosis Neg Hx    Colon cancer Neg Hx    Esophageal cancer Neg Hx    Rectal cancer Neg Hx    Stomach cancer Neg Hx    Prior to Admission medications   Medication Sig Start Date End Date Taking? Authorizing Provider  acetaminophen  (TYLENOL ) 500 MG tablet Take 500 mg by mouth every 6 (six) hours as needed (pain.).   Yes [provider]  albuterol  (VENTOLIN  HFA) 108 (90 Base) MCG/ACT inhaler Inhale 1-2 puffs into the lungs every 6 (six) hours as needed for wheezing or shortness of breath. 06/12/23  Yes Raspet, Erin K, PA-C  ALPRAZolam  (XANAX ) 0.5 MG tablet Take 0.5 mg by mouth 2 (two) times daily as needed for anxiety. 09/27/22  Yes [provider]  amLODipine  (NORVASC ) 5 MG tablet Take 5 mg by mouth in the morning. 05/06/23  Yes [provider]  amphetamine -dextroamphetamine  (ADDERALL) 10 MG tablet Take 1 tablet (10 mg total) by mouth daily with breakfast. Take one pill  po  once or twice in the afternoon Patient taking differently: Take 10 mg by mouth See admin instructions. Take 1 tablet (10 mg) by mouth scheduled in the morning, and may take 1-2 tablets (10 mg -20 mg) by mouth in the afternoon--if needed for attention/focus 12/01/23  Yes Sater, Charlie LABOR, MD   diphenhydrAMINE -APAP, sleep, (GOODYS PM) 38-500 MG PACK Take 1 packet by mouth at bedtime as needed (sleep/pain.).   Yes [provider]  FLUoxetine  (PROZAC ) 20 MG capsule Take 60 mg by mouth in the morning. 12/14/19  Yes [provider]  hydrOXYzine  (ATARAX ) 10 MG tablet Take 10 mg by mouth 3 (three) times daily as needed for itching.   Yes [provider]  lisdexamfetamine (VYVANSE ) 60 MG  capsule Take 1 capsule (60 mg total) by mouth every morning. 12/19/23  Yes Sater, Charlie LABOR, MD  senna-docusate (SENOKOT-S) 8.6-50 MG tablet Take 1 tablet by mouth 2 (two) times daily as needed (constipation.). 11/07/20  Yes [provider]  tiZANidine  (ZANAFLEX ) 4 MG tablet TAKE 1 TABLET(4 MG) BY MOUTH EVERY 8 HOURS AS NEEDED FOR MUSCLE SPASMS 11/14/23  Yes Sater, Charlie LABOR, MD  [facility] MANAGEMENT [place] Inject 20 Units into the skin every Tuesday. 2.5 mg    [provider]  Spacer/Aero-Holding Chambers (AEROCHAMBER MV) inhaler Use as instructed 05/25/22   Van Knee, MD  Ublituximab-xiiy (BRIUMVI IV) Inject into the vein every 6 (six) months.    [provider]  Von Willebrand Factor , Recomb, (VONVENDI ) 650 units SOLR Vonvendi  1,300 (+/-) unit range intravenous solution    [provider]  Allergies:  Compazine  [prochlorperazine  edisylate], Codeine, Darvocet [propoxyphene n-acetaminophen ], Sulfa antibiotics, and Sulfamethoxazole  BP (!) 127/93   Pulse 80   Temp 97.7 F (36.5 C) (Oral)   Resp 16   Ht 5' 2 (1.575 m)   Wt 84.1 kg   LMP 12/02/2023 (Approximate)   SpO2 100%   BMI 33.91 kg/m  General alert and oriented  Lung CTAB  Car RRR  Abdomen is soft and non tender  Surgical scars noted   IMPRESSION: Menorrhagia,dysmenorrhea, fibroids VWD  PLAN: Humate P infusion then proceed with TAH/BS Risks reviewed Home health / humate p medication already arranged for home after discharge

## 2023-12-27 NOTE — Anesthesia Postprocedure Evaluation (Signed)
 Anesthesia Post Note  Patient: Brianda N Lindsey-Evans  Procedure(s) Performed: HYSTERECTOMY, TOTAL, ABDOMINAL, WITH SALPINGECTOMY (Bilateral)     Patient location during evaluation: PACU Anesthesia Type: General Level of consciousness: awake and alert Pain management: pain level controlled Vital Signs Assessment: post-procedure vital signs reviewed and stable Respiratory status: spontaneous breathing, nonlabored ventilation, respiratory function stable and patient connected to nasal cannula oxygen Cardiovascular status: blood pressure returned to baseline and stable Postop Assessment: no apparent nausea or vomiting Anesthetic complications: no Comments: Severe pain in PACU unrelieved with escalating doses of IV opioids and adjuvant medications. Bilateral TAP blocks performed with significant relief.    No notable events documented.  Last Vitals:  Vitals:   12/27/23 1145 12/27/23 1200  BP: 109/66 125/74  Pulse: 82 80  Resp: 13 20  Temp:  (!) 36.4 C  SpO2: 97% 97%    Last Pain:  Vitals:   12/27/23 1130  TempSrc:   PainSc: Asleep                 Lynwood MARLA Cornea

## 2023-12-27 NOTE — Anesthesia Procedure Notes (Signed)
 Procedure Name: Intubation Date/Time: 12/27/2023 7:28 AM  Performed by: Evette Ade, CRNAPre-anesthesia Checklist: Patient identified, Emergency Drugs available, Suction available, Patient being monitored and Timeout performed Patient Re-evaluated:Patient Re-evaluated prior to induction Oxygen Delivery Method: Circle system utilized Preoxygenation: Pre-oxygenation with 100% oxygen Induction Type: IV induction Ventilation: Mask ventilation without difficulty Laryngoscope Size: Glidescope and 3 Grade View: Grade I Tube size: 7.0 mm Number of attempts: 1 Airway Equipment and Method: Stylet Placement Confirmation: ETT inserted through vocal cords under direct vision, positive ETCO2 and breath sounds checked- equal and bilateral Secured at: 22 cm Tube secured with: Tape Dental Injury: Teeth and Oropharynx as per pre-operative assessment

## 2023-12-28 ENCOUNTER — Encounter (HOSPITAL_COMMUNITY): Payer: Self-pay | Admitting: Obstetrics and Gynecology

## 2023-12-28 ENCOUNTER — Other Ambulatory Visit: Payer: Self-pay

## 2023-12-28 LAB — SURGICAL PATHOLOGY

## 2023-12-28 LAB — CBC
HCT: 25.1 % — ABNORMAL LOW (ref 36.0–46.0)
Hemoglobin: 8 g/dL — ABNORMAL LOW (ref 12.0–15.0)
MCH: 31.9 pg (ref 26.0–34.0)
MCHC: 31.9 g/dL (ref 30.0–36.0)
MCV: 100 fL (ref 80.0–100.0)
Platelets: 309 K/uL (ref 150–400)
RBC: 2.51 MIL/uL — ABNORMAL LOW (ref 3.87–5.11)
RDW: 13 % (ref 11.5–15.5)
WBC: 14.8 K/uL — ABNORMAL HIGH (ref 4.0–10.5)
nRBC: 0 % (ref 0.0–0.2)

## 2023-12-28 LAB — PREPARE RBC (CROSSMATCH)

## 2023-12-28 MED ORDER — OXYCODONE HCL 5 MG PO TABS
10.0000 mg | ORAL_TABLET | ORAL | Status: DC | PRN
  Administered 2023-12-28 – 2024-01-01 (×9): 10 mg via ORAL
  Filled 2023-12-28 (×10): qty 2

## 2023-12-28 MED ORDER — ACETAMINOPHEN 325 MG PO TABS
650.0000 mg | ORAL_TABLET | Freq: Four times a day (QID) | ORAL | Status: DC | PRN
  Administered 2023-12-28 – 2024-01-01 (×5): 650 mg via ORAL
  Filled 2023-12-28 (×5): qty 2

## 2023-12-28 MED ORDER — BISACODYL 10 MG RE SUPP
10.0000 mg | Freq: Once | RECTAL | Status: AC
  Administered 2023-12-28: 10 mg via RECTAL
  Filled 2023-12-28: qty 1

## 2023-12-28 MED ORDER — SODIUM CHLORIDE 0.9% IV SOLUTION: Freq: Once | INTRAVENOUS | Status: AC

## 2023-12-28 MED ORDER — SIMETHICONE 80 MG PO CHEW
80.0000 mg | CHEWABLE_TABLET | Freq: Four times a day (QID) | ORAL | Status: DC | PRN
  Administered 2023-12-28 – 2023-12-29 (×2): 80 mg via ORAL
  Filled 2023-12-28 (×2): qty 1

## 2023-12-28 MED ORDER — LACTATED RINGERS IV BOLUS
1000.0000 mL | Freq: Once | INTRAVENOUS | Status: AC
  Administered 2023-12-28: 1000 mL via INTRAVENOUS

## 2023-12-28 NOTE — Plan of Care (Signed)

## 2023-12-28 NOTE — Op Note (Signed)
 NAME: Ann [name], Ann N. MEDICAL RECORD NO: [id number] ACCOUNT NO: [id number] DATE OF BIRTH: 1980/08/14 FACILITY: [facility] LOCATION: [facility] PHYSICIAN: Nakoma Gotwalt L. Mat, MD  Operative Report   DATE OF PROCEDURE: 12/27/2023  PREOPERATIVE DIAGNOSES:  Menorrhagia and pelvic pain.  POSTOPERATIVE DIAGNOSES:  Menorrhagia and pelvic pain.  PROCEDURE:  Total abdominal hysterectomy, bilateral salpingectomy, and extensive lysis of adhesions.  SURGEON:  Jayd Cadieux L. Mat, MD.  ASSISTANTBETHA Norleen Skill.  ESTIMATED BLOOD LOSS:  Per anesthesia record.  COMPLICATIONS:  None.  DRAINS:  Foley catheter.  DESCRIPTION OF PROCEDURE:  The patient was taken to the operating room. She was intubated.  She was prepped and draped, and a Foley catheter was inserted.  A timeout was performed.  A low transverse incision was made and carried down to the fascia. The  fascia was scored in the midline and extended laterally.  The rectus muscles were divided, and the peritoneum was entered carefully with Metzenbaum scissors. The peritoneal incision was then extended, and there was noted to be dense adhesions involving  the anterior portion of the uterus to the abdominal wall, most likely from her previous C-section. The uterus was elevated with a Leahy clamp, and the adhesions were taken down with sharp and blunt dissection.  We then could see that there was a thick  band on the right side just above the round ligament.  I clamped that with curved Heaney clamps. It was connecting the uterus to the anterior abdominal wall, and it was suture ligated.  Once that was divided, I could visualize the round ligament, and  then we suture ligated that with 0 Vicryl suture. We then clamped the triple pedicle on the right side.  Each pedicle was clamped, cut, and suture ligated using 0 Vicryl suture.  At this point, I went to the left side of the pelvis in a likewise fashion,  clamped and cut the round ligament.  It was suture  ligated and also clamped and cut the triple pedicle in likewise fashion. It was suture ligated, and then at that point, we then started to work on the bladder flap with sharp and blunt dissection,  freeing the bladder from the anterior surface of the cervix. Once we did that, then we were able to clamp the uterine arteries on either side with straight Heaney clamps.  Each pedicle was clamped, cut, and suture ligated using 0 Vicryl suture. Once we  did that, then we were able to walk our way down the uterosacral cardinal ligament complexes on either side.  Each pedicle was clamped, cut, and suture ligated using 0 Vicryl suture.  We did that without any difficulty.  Once we reached the level at the  external os, we placed curved Heaney clamps across that and then removed the cervix and the uterus and it was sent to Pathology. The angle stitches were secured using 0 Vicryl suture.  We then closed the cuff completely in a running lock stitch using 0  Vicryl suture.  Irrigation was performed. Hemostasis was pretty good. There was a little bit of bleeding near the anterior surface of the vaginal cuff, which was closed with 0 Vicryl suture.  We then placed Arista across the vaginal cuff for additional  hemostasis.  At this point, we then identified the fallopian tubes, and the fallopian tubes were identified and were removed with curved Heaney clamps, and the pedicles were suture ligated using 0 Vicryl suture.  Irrigation was performed. Hemostasis was  very good.  Again, the pelvis  was hemostatic.  The peritoneum was closed using 0 Vicryl.  The fascia was closed using 0 Vicryl x 2 starting each corner meeting in the midline.  After irrigation of the subcutaneous layer, the subcu was closed, and then  the skin was closed with 3-0 Vicryl on a Keith needle.  Benzoin, Steri-Strips, and a honeycomb dressing were applied.  All sponge, lap, and instrument counts were correct x2.  She was extubated and went to the recovery  room in good condition.   NIK D: 12/27/2023 2:11:22 pm T: 12/28/2023 12:53:00 am  JOB: [id number]/ [id number]

## 2023-12-28 NOTE — Progress Notes (Signed)
 POD # 1  S:  Patient is reporting pain control issues - pain is abdominal wall and above the incision. IV Dilaudid  works ok.  Also noticing some dizziness and morning BP meds were held.  O:   BP 99/60   Pulse 89   Temp 97.6 F (36.4 C) (Oral)   Resp 18   Ht 5' 2 (1.575 m)   Wt 84.1 kg   LMP 12/02/2023 (Approximate)   SpO2 97%   BMI 33.91 kg/m  Results for orders placed or performed during the hospital encounter of 12/27/23 (from the past 24 hours)  CBC     Status: Abnormal   Collection Time: 12/28/23  4:18 AM  Result Value Ref Range   WBC 14.8 (H) 4.0 - 10.5 K/uL   RBC 2.51 (L) 3.87 - 5.11 MIL/uL   Hemoglobin 8.0 (L) 12.0 - 15.0 g/dL   HCT 74.8 (L) 63.9 - 53.9 %   MCV 100.0 80.0 - 100.0 fL   MCH 31.9 26.0 - 34.0 pg   MCHC 31.9 30.0 - 36.0 g/dL   RDW 86.9 88.4 - 84.4 %   Platelets 309 150 - 400 K/uL   nRBC 0.0 0.0 - 0.2 %   Abdomen is soft and  Appropriately tender above incision Bandage some drainage noted - looks bright red  Of note bandage changed a few times yesterday   IMPRESSION: POD # 1  TAH/BS/ Extensive lysis of adhesions VWD Anemia  PLAN: I do believe she has had some post op oozing. Discussed with patient Administer 2nd dose of Humate P this am Recommend one unit of red cells this am - discussed with patient and she agrees. Repeat cbc in the am Add Tylenol  and muscle relaxant on regular schedule  Change dressing and monitor drainage Hold discharge until tomorrow am

## 2023-12-29 DIAGNOSIS — Z8261 Family history of arthritis: Secondary | ICD-10-CM | POA: Diagnosis not present

## 2023-12-29 DIAGNOSIS — F988 Other specified behavioral and emotional disorders with onset usually occurring in childhood and adolescence: Secondary | ICD-10-CM | POA: Diagnosis present

## 2023-12-29 DIAGNOSIS — E66812 Obesity, class 2: Secondary | ICD-10-CM | POA: Diagnosis present

## 2023-12-29 DIAGNOSIS — Z9884 Bariatric surgery status: Secondary | ICD-10-CM | POA: Diagnosis not present

## 2023-12-29 DIAGNOSIS — D6800 Von Willebrand disease, unspecified: Secondary | ICD-10-CM | POA: Diagnosis present

## 2023-12-29 DIAGNOSIS — N920 Excessive and frequent menstruation with regular cycle: Secondary | ICD-10-CM | POA: Diagnosis present

## 2023-12-29 DIAGNOSIS — D62 Acute posthemorrhagic anemia: Secondary | ICD-10-CM | POA: Diagnosis present

## 2023-12-29 DIAGNOSIS — D251 Intramural leiomyoma of uterus: Secondary | ICD-10-CM | POA: Diagnosis present

## 2023-12-29 DIAGNOSIS — N736 Female pelvic peritoneal adhesions (postinfective): Secondary | ICD-10-CM | POA: Diagnosis present

## 2023-12-29 DIAGNOSIS — Z9049 Acquired absence of other specified parts of digestive tract: Secondary | ICD-10-CM | POA: Diagnosis not present

## 2023-12-29 DIAGNOSIS — K567 Ileus, unspecified: Secondary | ICD-10-CM | POA: Diagnosis present

## 2023-12-29 DIAGNOSIS — R102 Pelvic and perineal pain: Secondary | ICD-10-CM | POA: Diagnosis present

## 2023-12-29 DIAGNOSIS — Z6833 Body mass index (BMI) 33.0-33.9, adult: Secondary | ICD-10-CM | POA: Diagnosis not present

## 2023-12-29 DIAGNOSIS — Y838 Other surgical procedures as the cause of abnormal reaction of the patient, or of later complication, without mention of misadventure at the time of the procedure: Secondary | ICD-10-CM | POA: Diagnosis present

## 2023-12-29 DIAGNOSIS — Z87891 Personal history of nicotine dependence: Secondary | ICD-10-CM | POA: Diagnosis not present

## 2023-12-29 DIAGNOSIS — I10 Essential (primary) hypertension: Secondary | ICD-10-CM | POA: Diagnosis present

## 2023-12-29 DIAGNOSIS — Z981 Arthrodesis status: Secondary | ICD-10-CM | POA: Diagnosis not present

## 2023-12-29 DIAGNOSIS — Z8249 Family history of ischemic heart disease and other diseases of the circulatory system: Secondary | ICD-10-CM | POA: Diagnosis not present

## 2023-12-29 DIAGNOSIS — K219 Gastro-esophageal reflux disease without esophagitis: Secondary | ICD-10-CM | POA: Diagnosis present

## 2023-12-29 DIAGNOSIS — Z818 Family history of other mental and behavioral disorders: Secondary | ICD-10-CM | POA: Diagnosis not present

## 2023-12-29 DIAGNOSIS — G35 Multiple sclerosis: Secondary | ICD-10-CM | POA: Diagnosis present

## 2023-12-29 DIAGNOSIS — K9189 Other postprocedural complications and disorders of digestive system: Secondary | ICD-10-CM | POA: Diagnosis present

## 2023-12-29 DIAGNOSIS — Z823 Family history of stroke: Secondary | ICD-10-CM | POA: Diagnosis not present

## 2023-12-29 DIAGNOSIS — Z98891 History of uterine scar from previous surgery: Secondary | ICD-10-CM | POA: Diagnosis not present

## 2023-12-29 DIAGNOSIS — Z825 Family history of asthma and other chronic lower respiratory diseases: Secondary | ICD-10-CM | POA: Diagnosis not present

## 2023-12-29 LAB — CBC
HCT: 21.3 % — ABNORMAL LOW (ref 36.0–46.0)
Hemoglobin: 7.1 g/dL — ABNORMAL LOW (ref 12.0–15.0)
MCH: 31.6 pg (ref 26.0–34.0)
MCHC: 33.3 g/dL (ref 30.0–36.0)
MCV: 94.7 fL (ref 80.0–100.0)
Platelets: 201 K/uL (ref 150–400)
RBC: 2.25 MIL/uL — ABNORMAL LOW (ref 3.87–5.11)
RDW: 14.6 % (ref 11.5–15.5)
WBC: 10 K/uL (ref 4.0–10.5)
nRBC: 0 % (ref 0.0–0.2)

## 2023-12-29 LAB — PREPARE RBC (CROSSMATCH)

## 2023-12-29 MED ORDER — SODIUM CHLORIDE 0.9% IV SOLUTION: Freq: Once | INTRAVENOUS | Status: DC

## 2023-12-29 MED ORDER — LACTATED RINGERS IV SOLN: INTRAVENOUS | Status: AC

## 2023-12-29 MED ORDER — HYDROMORPHONE HCL 1 MG/ML IJ SOLN
2.0000 mg | Freq: Once | INTRAMUSCULAR | Status: DC
  Filled 2023-12-29: qty 2

## 2023-12-29 MED ORDER — FLEET ENEMA RE ENEM
1.0000 | ENEMA | Freq: Once | RECTAL | Status: AC
  Administered 2023-12-29: 1 via RECTAL

## 2023-12-29 MED ORDER — ANTIHEMOPHILIC FACTOR-VWF 250-600 UNITS IV SOLR
3630.0000 [IU] | Freq: Once | INTRAVENOUS | Status: AC
  Administered 2023-12-29: 3630 [IU] via INTRAVENOUS
  Filled 2023-12-29: qty 0

## 2023-12-29 MED ORDER — HYDROMORPHONE HCL 1 MG/ML IJ SOLN
1.0000 mg | Freq: Once | INTRAMUSCULAR | Status: AC
  Administered 2023-12-29: 1 mg via INTRAVENOUS
  Filled 2023-12-29: qty 1

## 2023-12-29 MED ORDER — PROMETHAZINE HCL 25 MG PO TABS
25.0000 mg | ORAL_TABLET | Freq: Four times a day (QID) | ORAL | Status: DC | PRN
  Administered 2023-12-29 – 2023-12-31 (×2): 25 mg via ORAL
  Filled 2023-12-29 (×2): qty 1

## 2023-12-29 NOTE — Progress Notes (Signed)
 POD # 2  Yesterday, Patient unable to pass gas. Tried Dulcolax suppository, ambulating No nausea.  Noticed two nickle sized clots passed vaginally. Dizziness much improved since blood transfusion.  BP 121/65 (BP Location: Left Wrist)   Pulse 100   Temp 98.5 F (36.9 C) (Oral)   Resp 17   Ht 5' 2 (1.575 m)   Wt 84.1 kg   LMP 12/02/2023 (Approximate)   SpO2 95%   BMI 33.91 kg/m  Results for orders placed or performed during the hospital encounter of 12/27/23 (from the past 24 hours)  Prepare RBC (crossmatch)     Status: None   Collection Time: 12/28/23  8:14 AM  Result Value Ref Range   Order Confirmation      ORDER PROCESSED BY BLOOD BANK Performed at [facility], [address], [place], [place] [address]    Abdomen is distended - not overly tender, hypoactive bowel sounds Dressing very faint drainage  POD # 2  TAH/BS VWD Post op ileus   Sips today  Fleets enema  IV fluids  Ambulation  Change dressing Humate P this am Discussed with patient that she has a post op ileus maybe secondary to what I believe is post op oozing  Change to inpatient status  All questions answered at the bedside

## 2023-12-29 NOTE — Progress Notes (Signed)
 Patient received enema this am and had a BM but mostly water .  Some nausea - phenergan  is helping. Status post 2nd unit of blood  BP (!) 146/69   Pulse 94   Temp 98.2 F (36.8 C) (Oral)   Resp 17   Ht 5' 2 (1.575 m)   Wt 84.1 kg   LMP 12/02/2023 (Approximate)   SpO2 96%   BMI 33.91 kg/m  Results for orders placed or performed during the hospital encounter of 12/27/23 (from the past 24 hours)  CBC     Status: Abnormal   Collection Time: 12/29/23  8:08 AM  Result Value Ref Range   WBC 10.0 4.0 - 10.5 K/uL   RBC 2.25 (L) 3.87 - 5.11 MIL/uL   Hemoglobin 7.1 (L) 12.0 - 15.0 g/dL   HCT 78.6 (L) 63.9 - 53.9 %   MCV 94.7 80.0 - 100.0 fL   MCH 31.6 26.0 - 34.0 pg   MCHC 33.3 30.0 - 36.0 g/dL   RDW 85.3 88.4 - 84.4 %   Platelets 201 150 - 400 K/uL   nRBC 0.0 0.0 - 0.2 %  Prepare RBC (crossmatch)     Status: None   Collection Time: 12/29/23  9:28 AM  Result Value Ref Range   Order Confirmation      ORDER PROCESSED BY BLOOD BANK Performed at [facility], [address], [place], [place] [address]    Abdomen some hypoactive BS throughout  Slightly softer now  Bandage is slightly saturated   IMPRESSION: POD # 2  Post op ileus Keep NPO until passing gas Ambulate  Treat nausea  Change dressing  Plan reviewed with patient

## 2023-12-30 LAB — CBC
HCT: 22.9 % — ABNORMAL LOW (ref 36.0–46.0)
Hemoglobin: 7.8 g/dL — ABNORMAL LOW (ref 12.0–15.0)
MCH: 31.8 pg (ref 26.0–34.0)
MCHC: 34.1 g/dL (ref 30.0–36.0)
MCV: 93.5 fL (ref 80.0–100.0)
Platelets: 206 K/uL (ref 150–400)
RBC: 2.45 MIL/uL — ABNORMAL LOW (ref 3.87–5.11)
RDW: 15.5 % (ref 11.5–15.5)
WBC: 10.3 K/uL (ref 4.0–10.5)
nRBC: 0 % (ref 0.0–0.2)

## 2023-12-30 LAB — BPAM RBC
Blood Product Expiration Date: 202509162359
Blood Product Expiration Date: 202509192359
ISSUE DATE / TIME: 202508201002
ISSUE DATE / TIME: 202508210958
Unit Type and Rh: 5100
Unit Type and Rh: 5100

## 2023-12-30 LAB — TYPE AND SCREEN
ABO/RH(D): O POS
Antibody Screen: NEGATIVE
Unit division: 0
Unit division: 0

## 2023-12-30 MED ORDER — ANTIHEMOPHILIC FACTOR-VWF 250-600 UNITS IV SOLR
3690.0000 [IU] | Freq: Once | INTRAVENOUS | Status: AC
  Administered 2023-12-30: 3690 [IU] via INTRAVENOUS
  Filled 2023-12-30: qty 0

## 2023-12-30 MED ORDER — BISACODYL 10 MG RE SUPP
10.0000 mg | Freq: Once | RECTAL | Status: AC
  Administered 2023-12-30: 10 mg via RECTAL
  Filled 2023-12-30: qty 1

## 2023-12-30 MED ORDER — LACTATED RINGERS IV SOLN: INTRAVENOUS | Status: AC

## 2023-12-30 MED FILL — Antihemophilic Factor/VWF (Human) For Inj 250-600 Unit: INTRAVENOUS | Qty: 3630 | Status: CN

## 2023-12-30 MED FILL — Antihemophilic Factor/VWF (Human) For Inj 250-600 Unit: INTRAVENOUS | Qty: 532 | Status: AC

## 2023-12-30 MED FILL — Antihemophilic Factor/VWF (Human) For Inj 1000-2400 Unit: INTRAVENOUS | Qty: 2121 | Status: AC

## 2023-12-30 MED FILL — Antihemophilic Factor/VWF (Human) For Inj 500-1200 Unit: INTRAVENOUS | Qty: 1037 | Status: AC

## 2023-12-30 NOTE — Progress Notes (Signed)
 POD # 3  Pain is improving. No flatus yet but nausea resolved.    BP 126/71 (BP Location: Left Arm)   Pulse 97   Temp 98.4 F (36.9 C) (Oral)   Resp 18   Ht 5' 2 (1.575 m)   Wt 84.1 kg   LMP 12/02/2023 (Approximate)   SpO2 97%   BMI 33.91 kg/m  Results for orders placed or performed during the hospital encounter of 12/27/23 (from the past 24 hours)  CBC     Status: Abnormal   Collection Time: 12/29/23  8:08 AM  Result Value Ref Range   WBC 10.0 4.0 - 10.5 K/uL   RBC 2.25 (L) 3.87 - 5.11 MIL/uL   Hemoglobin 7.1 (L) 12.0 - 15.0 g/dL   HCT 78.6 (L) 63.9 - 53.9 %   MCV 94.7 80.0 - 100.0 fL   MCH 31.6 26.0 - 34.0 pg   MCHC 33.3 30.0 - 36.0 g/dL   RDW 85.3 88.4 - 84.4 %   Platelets 201 150 - 400 K/uL   nRBC 0.0 0.0 - 0.2 %  Prepare RBC (crossmatch)     Status: None   Collection Time: 12/29/23  9:28 AM  Result Value Ref Range   Order Confirmation      ORDER PROCESSED BY BLOOD BANK Performed at [facility], [address], [place], [place] [address]   CBC     Status: Abnormal   Collection Time: 12/30/23  6:20 AM  Result Value Ref Range   WBC 10.3 4.0 - 10.5 K/uL   RBC 2.45 (L) 3.87 - 5.11 MIL/uL   Hemoglobin 7.8 (L) 12.0 - 15.0 g/dL   HCT 77.0 (L) 63.9 - 53.9 %   MCV 93.5 80.0 - 100.0 fL   MCH 31.8 26.0 - 34.0 pg   MCHC 34.1 30.0 - 36.0 g/dL   RDW 84.4 88.4 - 84.4 %   Platelets 206 150 - 400 K/uL   nRBC 0.0 0.0 - 0.2 %   Abdomen less distended and less tender  Bandage is dry   POD # 3  TAH VWD Post op ileus Ileus may be starting to improve but continue NPO  Ambulate  Dulocolax suppository today  Continue IV Fluids Humate P this am  Plan of care reviewed with patient and her sister and her mother

## 2023-12-30 NOTE — Hospital Course (Signed)
 POD # 3 S:  feeling a little better this am. No flatus yet.  Ambulating well. Pain is decreased.  O:  BP 126/71 (BP Location: Left Arm)   Pulse 97   Temp 98.4 F (36.9 C) (Oral)   Resp 18   Ht 5' 2 (1.575 m)   Wt 84.1 kg   LMP 12/02/2023 (Approximate)   SpO2 97%   BMI 33.91 kg/m  Results for orders placed or performed during the hospital encounter of 12/27/23 (from the past 24 hours)  CBC     Status: Abnormal   Collection Time: 12/29/23  8:08 AM  Result Value Ref Range   WBC 10.0 4.0 - 10.5 K/uL   RBC 2.25 (L) 3.87 - 5.11 MIL/uL   Hemoglobin 7.1 (L) 12.0 - 15.0 g/dL   HCT 78.6 (L) 63.9 - 53.9 %   MCV 94.7 80.0 - 100.0 fL   MCH 31.6 26.0 - 34.0 pg   MCHC 33.3 30.0 - 36.0 g/dL   RDW 85.3 88.4 - 84.4 %   Platelets 201 150 - 400 K/uL   nRBC 0.0 0.0 - 0.2 %  Prepare RBC (crossmatch)     Status: None   Collection Time: 12/29/23  9:28 AM  Result Value Ref Range   Order Confirmation      ORDER PROCESSED BY BLOOD BANK Performed at [facility], [address], [place], [place] [address]   CBC     Status: Abnormal   Collection Time: 12/30/23  6:20 AM  Result Value Ref Range   WBC 10.3 4.0 - 10.5 K/uL   RBC 2.45 (L) 3.87 - 5.11 MIL/uL   Hemoglobin 7.8 (L) 12.0 - 15.0 g/dL   HCT 77.0 (L) 63.9 - 53.9 %   MCV 93.5 80.0 - 100.0 fL   MCH 31.8 26.0 - 34.0 pg   MCHC 34.1 30.0 - 36.0 g/dL   RDW 84.4 88.4 - 84.4 %   Platelets 206 150 - 400 K/uL   nRBC 0.0 0.0 - 0.2 %   Abdomen less distended  Bowel sounds are better  Tenderness is less  Bandage is clean and dry and intact   IMPRESSION: POD # 3  Postop ileus starting to improve  Ambulate Dulcolax suppositories NPO  Answered all questions today  Humate P today

## 2023-12-31 MED ORDER — ONDANSETRON HCL 4 MG/2ML IJ SOLN
4.0000 mg | Freq: Four times a day (QID) | INTRAMUSCULAR | Status: DC | PRN
  Administered 2023-12-31: 4 mg via INTRAVENOUS
  Filled 2023-12-31: qty 2

## 2023-12-31 MED ORDER — ANTIHEMOPHILIC FACTOR-VWF 250-600 UNITS IV SOLR
3252.0000 [IU] | Freq: Once | INTRAVENOUS | Status: AC
  Administered 2023-12-31: 3252 [IU] via INTRAVENOUS
  Filled 2023-12-31: qty 2164

## 2023-12-31 MED ORDER — PANTOPRAZOLE SODIUM 40 MG IV SOLR
40.0000 mg | Freq: Two times a day (BID) | INTRAVENOUS | Status: DC
  Administered 2023-12-31: 40 mg via INTRAVENOUS
  Filled 2023-12-31: qty 10

## 2023-12-31 MED ORDER — BISACODYL 10 MG RE SUPP
10.0000 mg | Freq: Every day | RECTAL | Status: DC | PRN
  Administered 2023-12-31: 10 mg via RECTAL
  Filled 2023-12-31: qty 1

## 2023-12-31 NOTE — Progress Notes (Signed)
 Patient had BM this evening with some gas - not fully formed. Also now having some epigastric pain - lower abdominal pain is minimal. Distention is less Bandage is dry  Having some nausea  Will give her protonix  IV  Phenergan  prn Of note patient has had laparoscopic gastric sleeve  May be contributing to her ileus and her GERD currently

## 2023-12-31 NOTE — Progress Notes (Signed)
 POD # 4  Passed flatus yesterday and had a few BMs - one was formed.  Started her on clears yesterday and this am she is reporting belching and some more gas pain.  BP (!) 143/81 (BP Location: Left Arm)   Pulse 92   Temp 98.4 F (36.9 C) (Oral)   Resp 17   Ht 5' 2 (1.575 m)   Wt 84.1 kg   LMP 12/02/2023 (Approximate)   SpO2 93%   BMI 33.91 kg/m  No results found for this or any previous visit (from the past 24 hours). Abdomen is still slightly distended  BS present  Bandage is dry   POD # 4  TAH VWD Post op ileus Ileus turning around Repeat dulcolax suppository this am and if no flatus will then administer Fleets Humate P  Advance diet to regular if flatus  Possible discharge home today or tomorrow

## 2024-01-01 LAB — CBC
HCT: 27.9 % — ABNORMAL LOW (ref 36.0–46.0)
Hemoglobin: 9 g/dL — ABNORMAL LOW (ref 12.0–15.0)
MCH: 30.8 pg (ref 26.0–34.0)
MCHC: 32.3 g/dL (ref 30.0–36.0)
MCV: 95.5 fL (ref 80.0–100.0)
Platelets: 349 K/uL (ref 150–400)
RBC: 2.92 MIL/uL — ABNORMAL LOW (ref 3.87–5.11)
RDW: 15.2 % (ref 11.5–15.5)
WBC: 10.9 K/uL — ABNORMAL HIGH (ref 4.0–10.5)
nRBC: 0 % (ref 0.0–0.2)

## 2024-01-01 MED ORDER — PANTOPRAZOLE SODIUM 40 MG PO TBEC
40.0000 mg | DELAYED_RELEASE_TABLET | Freq: Two times a day (BID) | ORAL | Status: DC
  Administered 2024-01-01: 40 mg via ORAL
  Filled 2024-01-01: qty 1

## 2024-01-01 MED ORDER — ANTIHEMOPHILIC FACTOR-VWF 250-600 UNITS IV SOLR
3185.0000 [IU] | Freq: Once | INTRAVENOUS | Status: AC
  Administered 2024-01-01: 3185 [IU] via INTRAVENOUS
  Filled 2024-01-01: qty 1064

## 2024-01-01 MED ORDER — HYDROMORPHONE HCL 2 MG PO TABS: 2.0000 mg | ORAL_TABLET | ORAL | 0 refills | Status: AC | PRN

## 2024-01-01 MED ORDER — PANTOPRAZOLE SODIUM 40 MG PO TBEC: 40.0000 mg | DELAYED_RELEASE_TABLET | Freq: Two times a day (BID) | ORAL | 3 refills | Status: DC

## 2024-01-01 MED ORDER — PROMETHAZINE HCL 25 MG PO TABS: 25.0000 mg | ORAL_TABLET | Freq: Four times a day (QID) | ORAL | 0 refills | Status: DC | PRN

## 2024-01-01 NOTE — Progress Notes (Signed)
 This nurse discussed importance of getting up to eat and ambulate room. Patient states I will later, I just want to sleep right now.

## 2024-01-01 NOTE — Discharge Summary (Addendum)
 Admission Diagnosis: 12/27/2023  Discharge Diagnosis: 01/01/2024  Admission Diagnosis: Menorrhagia  Pelvic Pain Von Willebrands Disease   Discharge Diagnosis: Same Postop ileus  GERD  Anemia   Hospital Course: 43 year old female admitted for TAH/BS for menorrhagia. She has Von Willebrand's Disease and per hematology recommendations received Humate P the morning of surgery. Surgery complicated by dense adhesions.  Patient received a daily dose of humate P postop. Postop course complicated by postop ileus, anemia requiring total of two units of blood, and GERD.  She most likely had some mild postop oozing (from the VWD) on POD #0 and POE # 1 and hemoglobin was 7.8. She was a little dizzy with ambulating so I gave her a total of two units of blood one on POD # 1 and the second on POD # 2 . She responded well and postop hemoglobin on POD # 5 was 9.0.  Her ileus was the biggest problem after surgery which developed on POD # 2. I placed her NPO and gave her IV fluids. She ambulated well and started to pass a small amount of gas on POD # 3 and had some small BMs.  She has a history of gastric sleeve surgery and I think this was a major factor in the ileus and her GERD.  By POD # 4 she had some upper abdominal pain that responded well to IV Protonix . By POD # 5, she was ambulating, had flatus, tolerating very small amounts of regular food but that is her regular diet, and felt much better.  She was discharged home on POD # 5 .   BP 114/67 (BP Location: Right Arm)   Pulse 81   Temp 98.6 F (37 C) (Oral)   Resp 17   Ht 5' 2 (1.575 m)   Wt 84.1 kg   LMP 12/02/2023 (Approximate)   SpO2 97%   BMI 33.91 kg/m  Scheduled Meds:  sodium chloride    Intravenous Once   amLODipine   5 mg Oral q AM   antihemophilic factor -vwf  3,252 Units Intravenous Once   docusate sodium   100 mg Oral BID   FLUoxetine   60 mg Oral q AM   methocarbamol   750 mg Oral QID   pantoprazole   40 mg Oral BID   Continuous  Infusions: PRN Meds:.acetaminophen , albuterol , bisacodyl , diphenhydrAMINE , HYDROmorphone  (DILAUDID ) injection, HYDROmorphone , menthol -cetylpyridinium, ondansetron  (ZOFRAN ) IV, oxyCODONE , polyethylene glycol, promethazine , simethicone  Abdomen is slightly tender and not distended  Bandage is clean and dry  She will be discharged home today  She was given rx protonix , dilaudid  and phenergan . She will follow up with me in 2 days for a post op appointment. She was given strict precautions to call me about.

## 2024-01-01 NOTE — Plan of Care (Signed)
  Problem: Education: Goal: Knowledge of General Education information will improve Description: Including pain rating scale, medication(s)/side effects and non-pharmacologic comfort measures Outcome: Completed/Met   Problem: Health Behavior/Discharge Planning: Goal: Ability to manage health-related needs will improve Outcome: Completed/Met   Problem: Clinical Measurements: Goal: Ability to maintain clinical measurements within normal limits will improve Outcome: Completed/Met Goal: Will remain free from infection Outcome: Completed/Met Goal: Diagnostic test results will improve Outcome: Completed/Met Goal: Respiratory complications will improve Outcome: Completed/Met Goal: Cardiovascular complication will be avoided Outcome: Completed/Met   Problem: Activity: Goal: Risk for activity intolerance will decrease Outcome: Completed/Met   Problem: Nutrition: Goal: Adequate nutrition will be maintained Outcome: Completed/Met   Problem: Coping: Goal: Level of anxiety will decrease Outcome: Completed/Met   Problem: Elimination: Goal: Will not experience complications related to bowel motility Outcome: Completed/Met Goal: Will not experience complications related to urinary retention Outcome: Completed/Met   Problem: Pain Managment: Goal: General experience of comfort will improve and/or be controlled Outcome: Completed/Met   Problem: Safety: Goal: Ability to remain free from injury will improve Outcome: Completed/Met   Problem: Skin Integrity: Goal: Risk for impaired skin integrity will decrease Outcome: Completed/Met   Problem: Education: Goal: Knowledge of the prescribed therapeutic regimen will improve Outcome: Completed/Met Goal: Understanding of sexual limitations or changes related to disease process or condition will improve Outcome: Completed/Met Goal: Individualized Educational Video(s) Outcome: Completed/Met   Problem: Self-Concept: Goal: Communication of  feelings regarding changes in body function or appearance will improve Outcome: Completed/Met   Problem: Skin Integrity: Goal: Demonstration of wound healing without infection will improve Outcome: Completed/Met

## 2024-01-02 MED FILL — Antihemophilic Factor/VWF (Human) For Inj 250-600 Unit: INTRAVENOUS | Qty: 3544 | Status: AC

## 2024-01-02 MED FILL — Antihemophilic Factor/VWF (Human) For Inj 250-600 Unit: INTRAVENOUS | Qty: 3630 | Status: AC

## 2024-01-06 ENCOUNTER — Other Ambulatory Visit: Payer: Self-pay

## 2024-01-06 ENCOUNTER — Inpatient Hospital Stay (HOSPITAL_COMMUNITY)
Admission: EM | Admit: 2024-01-06 | Discharge: 2024-01-13 | DRG: 394 | Disposition: A | Discharge status: Home / Self Care | Attending: Obstetrics & Gynecology | Admitting: Obstetrics & Gynecology

## 2024-01-06 ENCOUNTER — Encounter (HOSPITAL_COMMUNITY): Payer: Self-pay | Admitting: Emergency Medicine

## 2024-01-06 DIAGNOSIS — G8918 Other acute postprocedural pain: Secondary | ICD-10-CM | POA: Diagnosis present

## 2024-01-06 DIAGNOSIS — D6800 Von Willebrand disease, unspecified: Secondary | ICD-10-CM | POA: Diagnosis present

## 2024-01-06 DIAGNOSIS — Z825 Family history of asthma and other chronic lower respiratory diseases: Secondary | ICD-10-CM

## 2024-01-06 DIAGNOSIS — E876 Hypokalemia: Secondary | ICD-10-CM | POA: Diagnosis present

## 2024-01-06 DIAGNOSIS — F988 Other specified behavioral and emotional disorders with onset usually occurring in childhood and adolescence: Secondary | ICD-10-CM | POA: Diagnosis present

## 2024-01-06 DIAGNOSIS — K9189 Other postprocedural complications and disorders of digestive system: Principal | ICD-10-CM | POA: Diagnosis present

## 2024-01-06 DIAGNOSIS — Z882 Allergy status to sulfonamides status: Secondary | ICD-10-CM

## 2024-01-06 DIAGNOSIS — Z823 Family history of stroke: Secondary | ICD-10-CM

## 2024-01-06 DIAGNOSIS — M1990 Unspecified osteoarthritis, unspecified site: Secondary | ICD-10-CM | POA: Diagnosis present

## 2024-01-06 DIAGNOSIS — Z885 Allergy status to narcotic agent status: Secondary | ICD-10-CM

## 2024-01-06 DIAGNOSIS — T8149XA Infection following a procedure, other surgical site, initial encounter: Principal | ICD-10-CM | POA: Diagnosis present

## 2024-01-06 DIAGNOSIS — Z83719 Family history of colon polyps, unspecified: Secondary | ICD-10-CM

## 2024-01-06 DIAGNOSIS — Z818 Family history of other mental and behavioral disorders: Secondary | ICD-10-CM

## 2024-01-06 DIAGNOSIS — Z90722 Acquired absence of ovaries, bilateral: Secondary | ICD-10-CM

## 2024-01-06 DIAGNOSIS — F419 Anxiety disorder, unspecified: Secondary | ICD-10-CM | POA: Diagnosis present

## 2024-01-06 DIAGNOSIS — Z8701 Personal history of pneumonia (recurrent): Secondary | ICD-10-CM

## 2024-01-06 DIAGNOSIS — Y838 Other surgical procedures as the cause of abnormal reaction of the patient, or of later complication, without mention of misadventure at the time of the procedure: Secondary | ICD-10-CM | POA: Diagnosis present

## 2024-01-06 DIAGNOSIS — G8929 Other chronic pain: Secondary | ICD-10-CM | POA: Diagnosis present

## 2024-01-06 DIAGNOSIS — Z90710 Acquired absence of both cervix and uterus: Secondary | ICD-10-CM

## 2024-01-06 DIAGNOSIS — Z9049 Acquired absence of other specified parts of digestive tract: Secondary | ICD-10-CM

## 2024-01-06 DIAGNOSIS — G35 Multiple sclerosis: Secondary | ICD-10-CM | POA: Diagnosis present

## 2024-01-06 DIAGNOSIS — K567 Ileus, unspecified: Secondary | ICD-10-CM | POA: Diagnosis present

## 2024-01-06 DIAGNOSIS — Z8261 Family history of arthritis: Secondary | ICD-10-CM

## 2024-01-06 DIAGNOSIS — D62 Acute posthemorrhagic anemia: Secondary | ICD-10-CM | POA: Diagnosis present

## 2024-01-06 DIAGNOSIS — Z8249 Family history of ischemic heart disease and other diseases of the circulatory system: Secondary | ICD-10-CM

## 2024-01-06 DIAGNOSIS — Z981 Arthrodesis status: Secondary | ICD-10-CM

## 2024-01-06 DIAGNOSIS — J189 Pneumonia, unspecified organism: Secondary | ICD-10-CM

## 2024-01-06 DIAGNOSIS — N920 Excessive and frequent menstruation with regular cycle: Secondary | ICD-10-CM | POA: Diagnosis present

## 2024-01-06 DIAGNOSIS — K56609 Unspecified intestinal obstruction, unspecified as to partial versus complete obstruction: Secondary | ICD-10-CM | POA: Diagnosis present

## 2024-01-06 DIAGNOSIS — M545 Low back pain, unspecified: Secondary | ICD-10-CM | POA: Diagnosis present

## 2024-01-06 DIAGNOSIS — J9811 Atelectasis: Secondary | ICD-10-CM | POA: Diagnosis present

## 2024-01-06 DIAGNOSIS — I10 Essential (primary) hypertension: Secondary | ICD-10-CM | POA: Diagnosis present

## 2024-01-06 DIAGNOSIS — E278 Other specified disorders of adrenal gland: Secondary | ICD-10-CM | POA: Diagnosis present

## 2024-01-06 DIAGNOSIS — Z9884 Bariatric surgery status: Secondary | ICD-10-CM

## 2024-01-06 DIAGNOSIS — F319 Bipolar disorder, unspecified: Secondary | ICD-10-CM | POA: Diagnosis present

## 2024-01-06 DIAGNOSIS — Z87891 Personal history of nicotine dependence: Secondary | ICD-10-CM

## 2024-01-06 DIAGNOSIS — J45909 Unspecified asthma, uncomplicated: Secondary | ICD-10-CM | POA: Diagnosis present

## 2024-01-06 LAB — URINALYSIS, ROUTINE W REFLEX MICROSCOPIC
Bilirubin Urine: NEGATIVE
Glucose, UA: NEGATIVE mg/dL
Ketones, ur: NEGATIVE mg/dL
Leukocytes,Ua: NEGATIVE
Nitrite: NEGATIVE
Protein, ur: NEGATIVE mg/dL
Specific Gravity, Urine: 1.019 (ref 1.005–1.030)
pH: 6 (ref 5.0–8.0)

## 2024-01-06 LAB — CBC
HCT: 32.9 % — ABNORMAL LOW (ref 36.0–46.0)
Hemoglobin: 10.5 g/dL — ABNORMAL LOW (ref 12.0–15.0)
MCH: 31.3 pg (ref 26.0–34.0)
MCHC: 31.9 g/dL (ref 30.0–36.0)
MCV: 98.2 fL (ref 80.0–100.0)
Platelets: 486 K/uL — ABNORMAL HIGH (ref 150–400)
RBC: 3.35 MIL/uL — ABNORMAL LOW (ref 3.87–5.11)
RDW: 16 % — ABNORMAL HIGH (ref 11.5–15.5)
WBC: 8.4 K/uL (ref 4.0–10.5)
nRBC: 0 % (ref 0.0–0.2)

## 2024-01-06 NOTE — ED Triage Notes (Addendum)
 Patient reports persistent pain across lower abdomen this week , S/P hysterectomy last Aug. 19,2025 . No fever or chills. No emesis or diarrhea . Hypertensive at triage .

## 2024-01-07 ENCOUNTER — Inpatient Hospital Stay (HOSPITAL_COMMUNITY)

## 2024-01-07 ENCOUNTER — Encounter (HOSPITAL_COMMUNITY): Payer: Self-pay

## 2024-01-07 ENCOUNTER — Emergency Department (HOSPITAL_COMMUNITY)

## 2024-01-07 DIAGNOSIS — E876 Hypokalemia: Secondary | ICD-10-CM

## 2024-01-07 DIAGNOSIS — Z818 Family history of other mental and behavioral disorders: Secondary | ICD-10-CM | POA: Diagnosis not present

## 2024-01-07 DIAGNOSIS — I10 Essential (primary) hypertension: Secondary | ICD-10-CM | POA: Diagnosis present

## 2024-01-07 DIAGNOSIS — E279 Disorder of adrenal gland, unspecified: Secondary | ICD-10-CM

## 2024-01-07 DIAGNOSIS — Z885 Allergy status to narcotic agent status: Secondary | ICD-10-CM | POA: Diagnosis not present

## 2024-01-07 DIAGNOSIS — R9389 Abnormal findings on diagnostic imaging of other specified body structures: Secondary | ICD-10-CM

## 2024-01-07 DIAGNOSIS — K56609 Unspecified intestinal obstruction, unspecified as to partial versus complete obstruction: Secondary | ICD-10-CM | POA: Diagnosis not present

## 2024-01-07 DIAGNOSIS — Z823 Family history of stroke: Secondary | ICD-10-CM | POA: Diagnosis not present

## 2024-01-07 DIAGNOSIS — G35 Multiple sclerosis: Secondary | ICD-10-CM | POA: Diagnosis present

## 2024-01-07 DIAGNOSIS — J9811 Atelectasis: Secondary | ICD-10-CM | POA: Diagnosis present

## 2024-01-07 DIAGNOSIS — Z981 Arthrodesis status: Secondary | ICD-10-CM | POA: Diagnosis not present

## 2024-01-07 DIAGNOSIS — D62 Acute posthemorrhagic anemia: Secondary | ICD-10-CM | POA: Diagnosis present

## 2024-01-07 DIAGNOSIS — J45909 Unspecified asthma, uncomplicated: Secondary | ICD-10-CM | POA: Diagnosis present

## 2024-01-07 DIAGNOSIS — K567 Ileus, unspecified: Secondary | ICD-10-CM | POA: Diagnosis present

## 2024-01-07 DIAGNOSIS — Y838 Other surgical procedures as the cause of abnormal reaction of the patient, or of later complication, without mention of misadventure at the time of the procedure: Secondary | ICD-10-CM | POA: Diagnosis present

## 2024-01-07 DIAGNOSIS — F319 Bipolar disorder, unspecified: Secondary | ICD-10-CM | POA: Diagnosis present

## 2024-01-07 DIAGNOSIS — D6800 Von Willebrand disease, unspecified: Secondary | ICD-10-CM | POA: Diagnosis present

## 2024-01-07 DIAGNOSIS — Z87891 Personal history of nicotine dependence: Secondary | ICD-10-CM | POA: Diagnosis not present

## 2024-01-07 DIAGNOSIS — G8918 Other acute postprocedural pain: Secondary | ICD-10-CM | POA: Diagnosis present

## 2024-01-07 DIAGNOSIS — Z9884 Bariatric surgery status: Secondary | ICD-10-CM | POA: Diagnosis not present

## 2024-01-07 DIAGNOSIS — K9189 Other postprocedural complications and disorders of digestive system: Secondary | ICD-10-CM | POA: Diagnosis present

## 2024-01-07 DIAGNOSIS — Z8249 Family history of ischemic heart disease and other diseases of the circulatory system: Secondary | ICD-10-CM | POA: Diagnosis not present

## 2024-01-07 DIAGNOSIS — Z8701 Personal history of pneumonia (recurrent): Secondary | ICD-10-CM | POA: Diagnosis not present

## 2024-01-07 DIAGNOSIS — Z825 Family history of asthma and other chronic lower respiratory diseases: Secondary | ICD-10-CM | POA: Diagnosis not present

## 2024-01-07 DIAGNOSIS — Z8261 Family history of arthritis: Secondary | ICD-10-CM | POA: Diagnosis not present

## 2024-01-07 DIAGNOSIS — Z882 Allergy status to sulfonamides status: Secondary | ICD-10-CM | POA: Diagnosis not present

## 2024-01-07 DIAGNOSIS — T8149XA Infection following a procedure, other surgical site, initial encounter: Secondary | ICD-10-CM | POA: Diagnosis present

## 2024-01-07 DIAGNOSIS — N920 Excessive and frequent menstruation with regular cycle: Secondary | ICD-10-CM | POA: Diagnosis present

## 2024-01-07 DIAGNOSIS — E278 Other specified disorders of adrenal gland: Secondary | ICD-10-CM | POA: Diagnosis present

## 2024-01-07 LAB — COMPREHENSIVE METABOLIC PANEL WITH GFR
ALT: 21 U/L (ref 0–44)
AST: 19 U/L (ref 15–41)
Albumin: 3.5 g/dL (ref 3.5–5.0)
Alkaline Phosphatase: 61 U/L (ref 38–126)
Anion gap: 9 (ref 5–15)
BUN: 8 mg/dL (ref 6–20)
CO2: 26 mmol/L (ref 22–32)
Calcium: 9.1 mg/dL (ref 8.9–10.3)
Chloride: 106 mmol/L (ref 98–111)
Creatinine, Ser: 0.75 mg/dL (ref 0.44–1.00)
GFR, Estimated: >60 mL/min (ref >60)
Glucose, Bld: 88 mg/dL (ref 70–99)
Potassium: 3.2 mmol/L — ABNORMAL LOW (ref 3.5–5.1)
Sodium: 141 mmol/L (ref 135–145)
Total Bilirubin: 0.4 mg/dL (ref 0.0–1.2)
Total Protein: 5.6 g/dL — ABNORMAL LOW (ref 6.5–8.1)

## 2024-01-07 LAB — PROCALCITONIN: Procalcitonin: <0.1 ng/mL

## 2024-01-07 LAB — LIPASE, BLOOD: Lipase: 32 U/L (ref 11–51)

## 2024-01-07 LAB — I-STAT CG4 LACTIC ACID, ED
Lactic Acid, Venous: 0.7 mmol/L (ref 0.5–1.9)
Lactic Acid, Venous: 1 mmol/L (ref 0.5–1.9)

## 2024-01-07 LAB — C-REACTIVE PROTEIN: CRP: 2 mg/dL — ABNORMAL HIGH (ref <1.0)

## 2024-01-07 LAB — SEDIMENTATION RATE: Sed Rate: 40 mm/h — ABNORMAL HIGH (ref 0–22)

## 2024-01-07 MED ORDER — ALPRAZOLAM 0.5 MG PO TABS
0.5000 mg | ORAL_TABLET | Freq: Two times a day (BID) | ORAL | Status: DC | PRN
  Administered 2024-01-07 – 2024-01-12 (×6): 0.5 mg via ORAL
  Filled 2024-01-07 (×6): qty 1

## 2024-01-07 MED ORDER — POTASSIUM CHLORIDE CRYS ER 20 MEQ PO TBCR
40.0000 meq | EXTENDED_RELEASE_TABLET | Freq: Once | ORAL | Status: AC
  Administered 2024-01-07: 40 meq via ORAL
  Filled 2024-01-07: qty 2

## 2024-01-07 MED ORDER — PANTOPRAZOLE SODIUM 40 MG PO TBEC: 40.0000 mg | DELAYED_RELEASE_TABLET | Freq: Two times a day (BID) | ORAL | Status: DC

## 2024-01-07 MED ORDER — UBLITUXIMAB-XIIY 150 MG/6ML IV SOLN: INTRAVENOUS | Status: DC

## 2024-01-07 MED ORDER — SENNOSIDES-DOCUSATE SODIUM 8.6-50 MG PO TABS: 1.0000 | ORAL_TABLET | Freq: Two times a day (BID) | ORAL | Status: DC | PRN

## 2024-01-07 MED ORDER — MORPHINE SULFATE (PF) 4 MG/ML IV SOLN
4.0000 mg | Freq: Once | INTRAVENOUS | Status: AC
  Administered 2024-01-07: 4 mg via INTRAVENOUS
  Filled 2024-01-07: qty 1

## 2024-01-07 MED ORDER — FLUOXETINE HCL 20 MG PO CAPS
60.0000 mg | ORAL_CAPSULE | Freq: Every morning | ORAL | Status: DC
  Administered 2024-01-10 – 2024-01-13 (×4): 60 mg via ORAL
  Filled 2024-01-07 (×5): qty 3

## 2024-01-07 MED ORDER — METRONIDAZOLE 500 MG/100ML IV SOLN
500.0000 mg | Freq: Two times a day (BID) | INTRAVENOUS | Status: DC
  Administered 2024-01-07 – 2024-01-08 (×3): 500 mg via INTRAVENOUS
  Filled 2024-01-07 (×3): qty 100

## 2024-01-07 MED ORDER — ONDANSETRON HCL 4 MG/2ML IJ SOLN
4.0000 mg | Freq: Once | INTRAMUSCULAR | Status: AC
  Administered 2024-01-07: 4 mg via INTRAVENOUS
  Filled 2024-01-07: qty 2

## 2024-01-07 MED ORDER — HYDROMORPHONE HCL 1 MG/ML IJ SOLN
1.0000 mg | INTRAMUSCULAR | Status: DC | PRN
  Administered 2024-01-07 – 2024-01-13 (×29): 1 mg via INTRAVENOUS
  Filled 2024-01-07 (×29): qty 1

## 2024-01-07 MED ORDER — SODIUM CHLORIDE 0.9 % IV SOLN
25.0000 mg | Freq: Four times a day (QID) | INTRAVENOUS | Status: DC | PRN
  Administered 2024-01-07 – 2024-01-08 (×3): 25 mg via INTRAVENOUS
  Filled 2024-01-07 (×4): qty 1

## 2024-01-07 MED ORDER — IOHEXOL 350 MG/ML SOLN
75.0000 mL | Freq: Once | INTRAVENOUS | Status: AC | PRN
  Administered 2024-01-07: 75 mL via INTRAVENOUS

## 2024-01-07 MED ORDER — SODIUM CHLORIDE 0.9 % IV SOLN
2.0000 g | Freq: Once | INTRAVENOUS | Status: AC
  Administered 2024-01-07: 2 g via INTRAVENOUS
  Filled 2024-01-07: qty 20

## 2024-01-07 MED ORDER — SODIUM CHLORIDE 0.9 % IV SOLN
500.0000 mg | Freq: Once | INTRAVENOUS | Status: AC
  Administered 2024-01-07: 500 mg via INTRAVENOUS
  Filled 2024-01-07: qty 5

## 2024-01-07 MED ORDER — TIZANIDINE HCL 2 MG PO TABS: 4.0000 mg | ORAL_TABLET | Freq: Three times a day (TID) | ORAL | Status: DC | PRN

## 2024-01-07 MED ORDER — PANTOPRAZOLE SODIUM 40 MG IV SOLR
40.0000 mg | Freq: Every day | INTRAVENOUS | Status: DC
  Administered 2024-01-07 – 2024-01-11 (×5): 40 mg via INTRAVENOUS
  Filled 2024-01-07 (×5): qty 10

## 2024-01-07 MED ORDER — AMLODIPINE BESYLATE 5 MG PO TABS
5.0000 mg | ORAL_TABLET | Freq: Every morning | ORAL | Status: DC
  Filled 2024-01-07: qty 1

## 2024-01-07 MED ORDER — ALBUTEROL SULFATE (2.5 MG/3ML) 0.083% IN NEBU: 3.0000 mL | INHALATION_SOLUTION | Freq: Four times a day (QID) | RESPIRATORY_TRACT | Status: DC | PRN

## 2024-01-07 MED ORDER — DEXTROSE IN LACTATED RINGERS 5 % IV SOLN: INTRAVENOUS | Status: AC

## 2024-01-07 MED ORDER — HYDROMORPHONE HCL 1 MG/ML IJ SOLN
1.0000 mg | Freq: Once | INTRAMUSCULAR | Status: AC
  Administered 2024-01-07: 1 mg via INTRAVENOUS
  Filled 2024-01-07: qty 1

## 2024-01-07 MED ORDER — ONDANSETRON HCL 4 MG/2ML IJ SOLN: 4.0000 mg | Freq: Four times a day (QID) | INTRAMUSCULAR | Status: DC | PRN

## 2024-01-07 MED ORDER — SODIUM CHLORIDE 0.9 % IV BOLUS
1000.0000 mL | Freq: Once | INTRAVENOUS | Status: AC
  Administered 2024-01-07: 1000 mL via INTRAVENOUS

## 2024-01-07 MED ORDER — PROMETHAZINE HCL 25 MG RE SUPP: 25.0000 mg | Freq: Four times a day (QID) | RECTAL | Status: DC | PRN

## 2024-01-07 MED ORDER — AMPHETAMINE-DEXTROAMPHETAMINE 10 MG PO TABS
10.0000 mg | ORAL_TABLET | Freq: Every day | ORAL | Status: DC
Start: 2024-01-08 — End: 2024-01-07

## 2024-01-07 MED ORDER — PROMETHAZINE HCL 25 MG PO TABS
25.0000 mg | ORAL_TABLET | Freq: Four times a day (QID) | ORAL | Status: DC | PRN
  Filled 2024-01-07: qty 1

## 2024-01-07 MED ORDER — LISDEXAMFETAMINE DIMESYLATE 20 MG PO CAPS: 60.0000 mg | ORAL_CAPSULE | Freq: Every morning | ORAL | Status: DC

## 2024-01-07 MED ORDER — DIATRIZOATE MEGLUMINE & SODIUM 66-10 % PO SOLN
90.0000 mL | Freq: Once | ORAL | Status: AC
  Administered 2024-01-07: 90 mL via NASOGASTRIC
  Filled 2024-01-07 (×2): qty 90

## 2024-01-07 MED ORDER — HYDROMORPHONE HCL 1 MG/ML IJ SOLN
1.0000 mg | INTRAMUSCULAR | Status: DC | PRN
  Administered 2024-01-07: 1 mg via INTRAVENOUS
  Filled 2024-01-07: qty 1

## 2024-01-07 MED ORDER — HYDROMORPHONE HCL 2 MG PO TABS
2.0000 mg | ORAL_TABLET | ORAL | Status: DC | PRN
  Administered 2024-01-10 – 2024-01-11 (×2): 2 mg via ORAL
  Filled 2024-01-07 (×2): qty 1

## 2024-01-07 MED ORDER — DIPHENHYDRAMINE HCL 50 MG/ML IJ SOLN
25.0000 mg | Freq: Four times a day (QID) | INTRAMUSCULAR | Status: DC | PRN
  Administered 2024-01-07 – 2024-01-13 (×16): 25 mg via INTRAVENOUS
  Filled 2024-01-07 (×16): qty 1

## 2024-01-07 MED ORDER — HYDROMORPHONE HCL 1 MG/ML IJ SOLN
0.2000 mg | INTRAMUSCULAR | Status: DC | PRN
  Administered 2024-01-07 (×2): 0.5 mg via INTRAVENOUS
  Filled 2024-01-07 (×2): qty 1

## 2024-01-07 MED ORDER — ONDANSETRON HCL 4 MG PO TABS: 4.0000 mg | ORAL_TABLET | Freq: Four times a day (QID) | ORAL | Status: DC | PRN

## 2024-01-07 NOTE — ED Provider Notes (Signed)
 [facility] Provider Note   CSN: [id number] Arrival date & time: 01/06/24  2245     Patient presents with: Abdominal Pain   Ann [name] is a 43 y.o. female with medical history of chronic low back pain, clotting disorder, GERD, von Willebrand disease, hypertension, hemorrhoids, fibroids, menorrhagia, status post total abdominal hysterectomy with bilateral salpingectomy on 12/27/2023.  Patient presents to ED out of concern of postoperative pain.  Reports that she has postoperative pain in her incision site.  Reports that she is at this pain ever since discharge.  States that she was seen by her OB/GYN physician 2 days ago in office where incision site was closed as it had slightly opened with Dermabond.  Patient reports that she was told everything was healing is appropriate.  She reports that today she developed nausea, 2 episodes of vomiting, fever up to 100.4 at home.  Reports he took Tylenol  presented to ED.  Reports that she has slight yellow discharge from site.  Denies vaginal bleeding, dysuria, fevers, diarrhea, flank pain.  Denies vaginal discharge.   Abdominal Pain Associated symptoms: fever, nausea and vomiting        Prior to Admission medications   Medication Sig Start Date End Date Taking? Authorizing Provider  acetaminophen  (TYLENOL ) 500 MG tablet Take 500 mg by mouth every 6 (six) hours as needed (pain.).   Yes [provider]  albuterol  (VENTOLIN  HFA) 108 (90 Base) MCG/ACT inhaler Inhale 1-2 puffs into the lungs every 6 (six) hours as needed for wheezing or shortness of breath. 06/12/23  Yes Raspet, Erin K, PA-C  ALPRAZolam  (XANAX ) 0.5 MG tablet Take 0.5 mg by mouth 2 (two) times daily as needed for anxiety. 09/27/22  Yes [provider]  amLODipine  (NORVASC ) 5 MG tablet Take 5 mg by mouth in the morning. 05/06/23  Yes [provider]  amphetamine -dextroamphetamine  (ADDERALL) 10 MG tablet Take 1  tablet (10 mg total) by mouth daily with breakfast. Take one pill  po  once or twice in the afternoon Patient taking differently: Take 10 mg by mouth See admin instructions. Take 1 tablet (10 mg) by mouth scheduled in the morning, and may take 1-2 tablets (10 mg -20 mg) by mouth in the afternoon--if needed for attention/focus 12/01/23  Yes Sater, Charlie LABOR, MD  diphenhydrAMINE -APAP, sleep, (GOODYS PM) 38-500 MG PACK Take 1 packet by mouth at bedtime as needed (sleep/pain.).   Yes [provider]  FLUoxetine  (PROZAC ) 20 MG capsule Take 60 mg by mouth in the morning. 12/14/19  Yes [provider]  HYDROmorphone  (DILAUDID ) 2 MG tablet Take 1 tablet (2 mg total) by mouth every 3 (three) hours as needed for severe pain (pain score 7-10) ((when tolerating fluids)). 01/01/24  Yes Mat Browning, MD  hydrOXYzine  (ATARAX ) 10 MG tablet Take 10 mg by mouth 3 (three) times daily as needed for itching.   Yes [provider]  lisdexamfetamine (VYVANSE ) 60 MG capsule Take 1 capsule (60 mg total) by mouth every morning. 12/19/23  Yes Sater, Charlie LABOR, MD  pantoprazole  (PROTONIX ) 40 MG tablet Take 1 tablet (40 mg total) by mouth 2 (two) times daily. 01/01/24  Yes Mat Browning, MD  promethazine  (PHENERGAN ) 25 MG tablet Take 1 tablet (25 mg total) by mouth every 6 (six) hours as needed for nausea or vomiting. 01/01/24  Yes Mat Browning, MD  senna-docusate (SENOKOT-S) 8.6-50 MG tablet Take 1 tablet by mouth 2 (two) times daily as needed (constipation.). 11/07/20  Yes [provider]  tiZANidine  (ZANAFLEX ) 4 MG tablet TAKE 1 TABLET(4 MG) BY MOUTH EVERY 8 HOURS AS NEEDED FOR MUSCLE SPASMS 11/14/23  Yes Sater, Charlie LABOR, MD  Ublituximab -xiiy (BRIUMVI  IV) Inject into the vein every 6 (six) months.   Yes [provider]  Von Willebrand Factor , Recomb, (VONVENDI ) 650 units SOLR Vonvendi  1,300 (+/-) unit range intravenous solution   Yes [provider]  SEMAGLUTIDE-WEIGHT  MANAGEMENT Foot of Ten Inject 20 Units into the skin every Tuesday. 2.5 mg Patient not taking: Reported on 01/07/2024    [provider]  Spacer/Aero-Holding Chambers (AEROCHAMBER MV) inhaler Use as instructed 05/25/22   Van Knee, MD    Allergies: Prochlorperazine , Codeine, Darvocet [propoxyphene n-acetaminophen ], Sulfa antibiotics, and Sulfamethoxazole    Review of Systems  Constitutional:  Positive for fever.  Gastrointestinal:  Positive for abdominal pain, nausea and vomiting.  All other systems reviewed and are negative.   Updated Vital Signs BP (!) 153/88   Pulse 92   Temp 99.4 F (37.4 C)   Resp 18   LMP 12/02/2023 (Approximate)   SpO2 99%   Physical Exam Vitals and nursing note reviewed.  Constitutional:      General: She is not in acute distress.    Appearance: She is well-developed.  HENT:     Head: Normocephalic and atraumatic.  Eyes:     Conjunctiva/sclera: Conjunctivae normal.  Cardiovascular:     Rate and Rhythm: Normal rate and regular rhythm.     Heart sounds: No murmur heard. Pulmonary:     Effort: Pulmonary effort is normal. No respiratory distress.     Breath sounds: Normal breath sounds.  Abdominal:     Palpations: Abdomen is soft.     Tenderness: There is abdominal tenderness.     Comments: Incision site noted lower abdominal area.  No evidence of infection.  No obvious purulence.  No dehiscence.  Musculoskeletal:        General: No swelling.     Cervical back: Neck supple.  Skin:    General: Skin is warm and dry.     Capillary Refill: Capillary refill takes less than 2 seconds.  Neurological:     Mental Status: She is alert and oriented to person, place, and time. Mental status is at baseline.  Psychiatric:        Mood and Affect: Mood normal.     (all labs ordered are listed, but only abnormal results are displayed) Labs Reviewed  COMPREHENSIVE METABOLIC PANEL WITH GFR - Abnormal; Notable for the following components:      Result  Value   Potassium 3.2 (*)    Total Protein 5.6 (*)    All other components within normal limits  CBC - Abnormal; Notable for the following components:   RBC 3.35 (*)    Hemoglobin 10.5 (*)    HCT 32.9 (*)    RDW 16.0 (*)    Platelets 486 (*)    All other components within normal limits  URINALYSIS, ROUTINE W REFLEX MICROSCOPIC - Abnormal; Notable for the following components:   Hgb urine dipstick SMALL (*)    Bacteria, UA RARE (*)    All other components within normal limits  LIPASE, BLOOD  PROCALCITONIN  I-STAT CG4 LACTIC ACID, ED  I-STAT CG4 LACTIC ACID, ED    EKG: None  Radiology: CT ABDOMEN PELVIS W CONTRAST Result Date: 01/07/2024 CLINICAL DATA:  43 year old female with abdominal pain on postoperative day 11 status post total abdominal hysterectomy, bilateral salpingectomy  and lysis of adhesions. EXAM: CT ABDOMEN AND PELVIS WITH CONTRAST TECHNIQUE: Multidetector CT imaging of the abdomen and pelvis was performed using the standard protocol following bolus administration of intravenous contrast. RADIATION DOSE REDUCTION: This exam was performed according to the departmental dose-optimization program which includes automated exposure control, adjustment of the mA and/or kV according to patient size and/or use of iterative reconstruction technique. CONTRAST:  75mL OMNIPAQUE  IOHEXOL  350 MG/ML SOLN COMPARISON:  CT Abdomen and Pelvis 07/29/2007. FINDINGS: Lower chest: Chronic elevation of the left hemidiaphragm. Increased lingula and left lower lobe consolidation now. No pleural effusion. Platelike atelectasis in the right lower lobe and right middle lobe is less pronounced. Heart size remains normal. No pericardial effusion. Hepatobiliary: Cholecystectomy. Perihepatic fluid with mostly simple fluid density, small volume. No bile duct dilatation. Pancreas: Negative. Spleen: Perisplenic fluid with mostly simple fluid density. No splenomegaly or splenic lesion. Adrenals/Urinary Tract: Left  adrenal gland 18 Hounsfield unit rounded 2.8 cm nodule. This is new since 2009. Right adrenal gland is normal. Nonobstructed kidneys. Small benign appearing renal cysts (no follow-up imaging recommended). No delayed excretory images are provided. Urinary bladder is within normal limits. Stomach/Bowel: Ascites with simple fluid density, but early thickening and enhancement of the adjacent peritoneal cavity (such as in the left lower quadrant series 3, image 65 and see coronal image 65. Smaller volume similar fluid with early peritoneal thickening and enhancement contralateral right lower quadrant. And fairly abundant ascites in the left upper quadrant. Mild retained stool in the rectosigmoid colon. Redundant sigmoid tracking into the left mid abdomen. Descending colon mild retained stool. Adjacent fluid in the left pericolic gutter. Decompressed transverse colon. Mild right colon and hepatic flexure retained gas and stool. No discrete large bowel inflammation. Evidence of diminutive and normal appendix on coronal images 64 and 59. Fluid containing but nondilated terminal ileum, nondilated small bowel loops in the lower abdomen and at the pelvic inlet. Upstream of that several fluid-filled small bowel loops are mildly dilated measuring up to 3 cm diameter. And there is a fairly abrupt transition point to the decompressed distal small bowel on series 3, image 66 in the mid abdomen just caudal to the umbilicus. Overlying postoperative changes to the ventral abdominal wall there with mild soft tissue thickening and stranding. Trace midline rectus muscle postoperative gas on image 68. Some confluent edema in the panniculus there (series 3, image 78), but no organized or drainable abdominal wall fluid collection. Upstream small bowel loops are fluid-filled but mostly decompressed. Postoperative changes along the greater curve of the stomach which is decompressed. Duodenum is decompressed. Low-density fluid in the bilateral  upper abdomen, but no pneumoperitoneum identified. Vascular/Lymphatic: Major arterial structures in the abdomen and pelvis are patent and enhancing. Normal caliber abdominal aorta. Portal venous system appears patent. No lymphadenopathy identified. Reproductive: Indistinct postoperative changes in the deep pelvis corresponding to the site of hysterectomy and salpingectomy. No discrete or measurable hematoma there. Other: No free fluid in the pelvis. Musculoskeletal: Interval lower lumbar and lumbosacral decompression and fusion. Posterior and interbody hardware in place. Severe adjacent segment disease. No acute or suspicious osseous lesion identified. IMPRESSION: 1. Postoperative changes to the lower abdominal wall, pelvis. Mild or early Small Bowel Obstruction with discrete transition point subjacent to the postoperative abdominal wall on series 3, image 66. Superimposed small to moderate volume free fluid in the abdomen and pelvis, mostly low fluid density. But areas of early adjacent peritoneal thickening and enhancement suggests the fluid is beginning to organize, developing abscess  not excluded. Indistinct postoperative changes in the deep pelvis with no discrete or measurable hematoma there. 2. Kidneys and ureters appear nonobstructed. Bladder within normal limits. 3. Lung base atelectasis, developing left lower lobe pneumonia not excluded. No pleural effusion. 4. Incidental Left adrenal gland 2.8 cm nodule appears new since 2009. Recommend follow-up Adrenal Protocol CT or MRI (without and with contrast). Electronically Signed   By: VEAR Hurst M.D.   On: 01/07/2024 05:12    Procedures   Medications Ordered in the ED  cefTRIAXone  (ROCEPHIN ) 2 g in sodium chloride  0.9 % 100 mL IVPB (2 g Intravenous New Bag/Given 01/07/24 0621)  azithromycin  (ZITHROMAX ) 500 mg in sodium chloride  0.9 % 250 mL IVPB (has no administration in time range)  ondansetron  (ZOFRAN ) injection 4 mg (4 mg Intravenous Given 01/07/24 0328)   sodium chloride  0.9 % bolus 1,000 mL (1,000 mLs Intravenous New Bag/Given 01/07/24 0328)  morphine  (PF) 4 MG/ML injection 4 mg (4 mg Intravenous Given 01/07/24 0329)  iohexol  (OMNIPAQUE ) 350 MG/ML injection 75 mL (75 mLs Intravenous Contrast Given 01/07/24 0348)  potassium chloride  SA (KLOR-CON  M) CR tablet 40 mEq (40 mEq Oral Given 01/07/24 0451)  HYDROmorphone  (DILAUDID ) injection 1 mg (1 mg Intravenous Given 01/07/24 0455)     Medical Decision Making Amount and/or Complexity of Data Reviewed Labs: ordered. Radiology: ordered.  Risk Prescription drug management. Decision regarding hospitalization.   This is a 43 year old female status post total abdominal hysterectomy, bilateral salpingectomy on 12/27/2023.  Patient presents to ED complaining of abdominal pain, fever, nausea, vomiting.  On exam, hemodynamically stable.  Afebrile, nontachycardic.  Lung sounds clear bilaterally, no hypoxia.  Abdomen with tenderness to lower abdominal fields about incision site.  Incision site appears clean dry intact, no obvious purulence or dehiscence.  Neuroexam at baseline.  Overall nontoxic.  Patient assessed utilizing CBC, CMP, lipase, urinalysis, lactic acid.  CT abdomen pelvis also obtained.  Patient given 4 mg of morphine , 4 of Zofran , 1 L of fluid.  CBC without leukocytosis, uptrending hemoglobin 10.5.  Metabolic panel potassium 3.2 repleted with 40 mEq oral potassium, no other electrolyte derangement, no elevated LFT, no gap 9.  Urinalysis shows small hemoglobin.  Lipase 32.  Lactic acid not elevated at 1.  CT abdomen pelvis shows postoperative changes to lower abdominal wall.  Mild early small bowel obstruction with discrete transition point subjacent to postoperative abdominal wall on series 3.  Superimposed small to moderate volume free fluid in the abdomen or pelvis mostly low fluid density.  Areas of early adjacent peritoneal thickening and enhancement suggest that the fluid is beginning to  organize, developing abscess not excluded.  Also indistinct postoperative changes in the pelvis with no discrete or measurable hematoma there.  Kidneys and ureters are nonobstructed.  Lung base atelectasis which is concerning for left lung base pneumonia.  Patient started on Rocephin , azithromycin  for cellulitis, CAP treatment.  Patient denies any new cough but does endorse fever at home.  Reports last bowel movement was yesterday, states it was diarrhea.  States decreased flatulence over the last day.  At this time patient will be admitted to hospital.  Have sent secure chat to on-call physician for [facility], Dr. Dannielle.  She has returned my secure chat, she will consult on patient.  At end of shift, patient not yet admitted to hospital.  Signed out to oncoming provider Prosperi PA-C pending admission.     Final diagnoses:  Post-operative wound abscess  Community acquired pneumonia of left  lower lobe of lung  Small bowel obstruction [facility])    ED Discharge Orders     None          Ruthell Lonni JULIANNA DEVONNA 01/07/24 0630    Raford Lenis, MD 01/07/24 [phone]

## 2024-01-07 NOTE — ED Notes (Addendum)
 Unit / floor updated by phone, pt pending arrival. Remains alert, NAD, calm, interactive. Pain med given, flagyl  started. VSS.

## 2024-01-07 NOTE — ED Provider Notes (Signed)
 Accepted handoff at shift change from [facility]. Please see prior provider note for more detail.   Briefly: Patient is 43 y.o.   DDX: concern for S/p hysterectomy and bso, on 19th -- worsening abd pain, discharge, pus at wound -- new SBO, abscess to incision site, maybe also new pneumonia. Reached out to OBGYN who will consult. Needs admission  Plan: Spoke with Dr. Alfornia who agrees to admit the patient for postop complication, SBO, surgical infection.  OB/GYN, Dr. Dannielle to consult.    Rosan Sherlean DEL, PA-C 01/07/24 0700    Yolande Lamar BROCKS, MD 01/10/24 [phone]

## 2024-01-07 NOTE — Consult Note (Addendum)
 Reason for Consult: SBO Referring Physician:  Dannielle Earing Ann [name] is an 43 y.o. female.  HPI:  We are consulted by Dr. Dannielle for assistance following readmission with pain, low-grade fever, and failure to thrive after total abdominal hysterectomy.  The patient underwent surgery on August 19 for menorrhagia and pelvic pain.  This required extensive lysis of adhesions and was an open operation.  Patient did require longer than usual postoperative course and is here for 5 days.  She had postoperative drift in her H&H and required 2 units transfusion. (Of note, patient does have von Willebrand's disease).   It sounds as though she never really thrived at home and came back with periumbilical pain, temperature of 100.5, intermittent nausea and vomiting, and difficulty having bowel movements.   Past Medical History:  Diagnosis Date   ADD (attention deficit disorder)    takes Adderall daily   Anemia    Anxiety    takes Xanax  daily as needed   Arthritis    Asthma    Mild. Only flares up when sick   Bipolar disorder (HCC)    Blood dyscrasia    Von Willebrand   Chronic lower back pain    Clotting disorder (HCC)    Depression    has meds prescribed but doesn't take them   Dysrhythmia    Sinus Arrythmia   Family history of adverse reaction to anesthesia    Mom gets PONV too   GERD (gastroesophageal reflux disease)    Headache    monthly (02/27/2015)   Hemorrhoids    History of esophagogastroduodenoscopy (EGD)    Hypertension    Migraine    none in the last 6 months (02/27/2015)   Multiple sclerosis (HCC)    Nocturia    Pneumonia several times   PONV (postoperative nausea and vomiting)    Von Willebrand disease (HCC)    Dr. Freddie    Past Surgical History:  Procedure Laterality Date   ABDOMINAL HYSTERECTOMY     BREAST CYST EXCISION Left 02/20/2018   Procedure: EXCISION OF LEFT BREAST SEBACEOUS CYST;  Surgeon: Ebbie Cough, MD;  Location: [facility];  Service: General;  Laterality: Left;   BREAST REDUCTION SURGERY Bilateral 02/27/2015   Procedure: BILATERAL BREAST REDUCTION WITH FREE NIPPLE GRAFT TECHNIQUE ON RIGHT BREAST;  Surgeon: Alm Sick, MD;  Location: [facility] OR;  Service: Plastics;  Laterality: Bilateral;   CESAREAN SECTION  2004; 2013   2004, 2013   CHOLECYSTECTOMY  03/02/2012   Procedure: LAPAROSCOPIC CHOLECYSTECTOMY WITH INTRAOPERATIVE CHOLANGIOGRAM;  Surgeon: Sherlean JINNY Laughter, MD;  Location: [facility] OR;  Service: General;  Laterality: N/A;   COLONOSCOPY     DILATION AND CURETTAGE OF UTERUS  ~ 1997; ~ 2005   DILITATION & CURRETTAGE/HYSTROSCOPY WITH THERMACHOICE ABLATION  04/28/2012   Procedure: DILATATION & CURETTAGE/HYSTEROSCOPY WITH THERMACHOICE ABLATION;  Surgeon: Rosaline LITTIE Cobble, MD;  Location: [facility];  Service: Gynecology;  Laterality: N/A;   ERCP  03/03/2012   Procedure: ENDOSCOPIC RETROGRADE CHOLANGIOPANCREATOGRAPHY (ERCP);  Surgeon: Belvie JONETTA Just, MD;  Location: [facility];  Service: Endoscopy;  Laterality: N/A;  dl/hung   HYSTERECTOMY ABDOMINAL WITH SALPINGECTOMY Bilateral 12/27/2023   Procedure: HYSTERECTOMY, TOTAL, ABDOMINAL, WITH SALPINGECTOMY;  Surgeon: Cobble Rosaline, MD;  Location: [facility] OR;  Service: Gynecology;  Laterality: Bilateral;   LAPAROSCOPIC GASTRIC SLEEVE RESECTION     with [facility]   LAPAROSCOPY  2011   LUMBAR DISC SURGERY  2003; 2008; 2009   MULTIPLE TOOTH EXTRACTIONS  scattered dates  POSTERIOR LUMBAR FUSION  2010   TUBAL LIGATION  2013   WISDOM TOOTH EXTRACTION  2001    Family History  Problem Relation Age of Onset   Colon polyps Mother    Stroke Mother    Arthritis Mother    Depression Mother    COPD Father    Kidney disease Paternal Uncle    Stroke Maternal Grandfather    Heart disease Paternal Grandfather    Multiple sclerosis Neg Hx    Colon cancer Neg Hx    Esophageal cancer Neg Hx    Rectal cancer Neg Hx    Stomach cancer Neg Hx     Social History:   reports that she quit smoking about 25 years ago. Her smoking use included cigarettes. She started smoking about 27 years ago. She has a 0.5 pack-year smoking history. She has never used smokeless tobacco. She reports current alcohol use. She reports that she does not use drugs.  Allergies:  Allergies  Allergen Reactions   Prochlorperazine  Other (See Comments)    Jaws locked up per pt   Codeine Itching    Able to take, but needs Benadryl    Darvocet [Propoxyphene N-Acetaminophen ] Nausea And Vomiting   Sulfa Antibiotics Rash   Sulfamethoxazole Rash    Medications:  albuterol  (VENTOLIN  HFA) 108 (90 Base) MCG/ACT inhaler ALPRAZolam  (XANAX ) 0.5 MG tablet amLODipine  (NORVASC ) 5 MG tablet amphetamine -dextroamphetamine  (ADDERALL) 10 MG tablet FLUoxetine  (PROZAC ) 20 MG capsule HYDROmorphone  (DILAUDID ) 2 MG tablet lisdexamfetamine (VYVANSE ) 60 MG capsule pantoprazole  (PROTONIX ) 40 MG tablet senna-docusate (SENOKOT-S) 8.6-50 MG tablet tiZANidine  (ZANAFLEX ) 4 MG tablet Ublituximab -xiiy (BRIUMVI  IV)  Results for orders placed or performed during the hospital encounter of 01/06/24 (from the past 48 hours)  Sedimentation rate     Status: Abnormal   Collection Time: 01/06/24 10:11 PM  Result Value Ref Range   Sed Rate 40 (H) 0 - 22 mm/hr    Comment: Performed at [facility], [address] [place], [place] [address]  C-reactive protein     Status: Abnormal   Collection Time: 01/06/24 10:11 PM  Result Value Ref Range   CRP 2.0 (H) <1.0 mg/dL    Comment: Performed at [facility], [address], [place], [place] [address]  Urinalysis, Routine w reflex microscopic -Urine, Clean Catch     Status: Abnormal   Collection Time: 01/06/24 11:02 PM  Result Value Ref Range   Color, Urine YELLOW YELLOW   APPearance CLEAR CLEAR   Specific Gravity, Urine 1.019 1.005 - 1.030   pH 6.0 5.0 - 8.0   Glucose, UA NEGATIVE NEGATIVE mg/dL   Hgb urine dipstick SMALL (A) NEGATIVE   Bilirubin  Urine NEGATIVE NEGATIVE   Ketones, ur NEGATIVE NEGATIVE mg/dL   Protein, ur NEGATIVE NEGATIVE mg/dL   Nitrite NEGATIVE NEGATIVE   Leukocytes,Ua NEGATIVE NEGATIVE   RBC / HPF 0-5 0 - 5 RBC/hpf   WBC, UA 6-10 0 - 5 WBC/hpf   Bacteria, UA RARE (A) NONE SEEN   Squamous Epithelial / HPF 0-5 0 - 5 /HPF   Mucus PRESENT     Comment: Performed at [facility], [address], [place], [place] [address]  Lipase, blood     Status: None   Collection Time: 01/06/24 11:11 PM  Result Value Ref Range   Lipase 32 11 - 51 U/L    Comment: Performed at [facility], [address] [place], [place] [address]  Comprehensive metabolic panel  Status: Abnormal   Collection Time: 01/06/24 11:11 PM  Result Value Ref Range   Sodium 141 135 - 145 mmol/L   Potassium 3.2 (L) 3.5 - 5.1 mmol/L    Comment: HEMOLYSIS AT THIS LEVEL MAY AFFECT RESULT   Chloride 106 98 - 111 mmol/L   CO2 26 22 - 32 mmol/L   Glucose, Bld 88 70 - 99 mg/dL    Comment: Glucose reference range applies only to samples taken after fasting for at least 8 hours.   BUN 8 6 - 20 mg/dL   Creatinine, Ser 9.24 0.44 - 1.00 mg/dL   Calcium 9.1 8.9 - 89.6 mg/dL   Total Protein 5.6 (L) 6.5 - 8.1 g/dL   Albumin 3.5 3.5 - 5.0 g/dL   AST 19 15 - 41 U/L    Comment: HEMOLYSIS AT THIS LEVEL MAY AFFECT RESULT   ALT 21 0 - 44 U/L    Comment: HEMOLYSIS AT THIS LEVEL MAY AFFECT RESULT   Alkaline Phosphatase 61 38 - 126 U/L   Total Bilirubin 0.4 0.0 - 1.2 mg/dL    Comment: HEMOLYSIS AT THIS LEVEL MAY AFFECT RESULT   GFR, Estimated >60 >60 mL/min    Comment: (NOTE) Calculated using the CKD-EPI Creatinine Equation (2021)    Anion gap 9 5 - 15    Comment: Performed at [facility], [address] [place], [place] [address]  CBC     Status: Abnormal   Collection Time: 01/06/24 11:11 PM  Result Value Ref Range   WBC 8.4 4.0 - 10.5 K/uL   RBC 3.35 (L) 3.87 - 5.11 MIL/uL   Hemoglobin 10.5 (L) 12.0 - 15.0 g/dL   HCT 67.0 (L) 63.9 -  46.0 %   MCV 98.2 80.0 - 100.0 fL   MCH 31.3 26.0 - 34.0 pg   MCHC 31.9 30.0 - 36.0 g/dL   RDW 83.9 (H) 88.4 - 84.4 %   Platelets 486 (H) 150 - 400 K/uL   nRBC 0.0 0.0 - 0.2 %    Comment: Performed at [facility], [address] [place], [place] [address]  I-Stat CG4 Lactic Acid     Status: None   Collection Time: 01/07/24  3:29 AM  Result Value Ref Range   Lactic Acid, Venous 0.7 0.5 - 1.9 mmol/L  Procalcitonin     Status: None   Collection Time: 01/07/24  5:45 AM  Result Value Ref Range   Procalcitonin <0.10 ng/mL    Comment:        Interpretation: PCT (Procalcitonin) <= 0.5 ng/mL: Systemic infection (sepsis) is not likely. Local bacterial infection is possible. (NOTE)       Sepsis PCT Algorithm           Lower Respiratory Tract                                      Infection PCT Algorithm    ----------------------------     ----------------------------         PCT < 0.25 ng/mL                PCT < 0.10 ng/mL          Strongly encourage             Strongly discourage   discontinuation of antibiotics    initiation of antibiotics    ----------------------------     -----------------------------  PCT 0.25 - 0.50 ng/mL            PCT 0.10 - 0.25 ng/mL               OR       >80% decrease in PCT            Discourage initiation of                                            antibiotics      Encourage discontinuation           of antibiotics    ----------------------------     -----------------------------         PCT >= 0.50 ng/mL              PCT 0.26 - 0.50 ng/mL               AND        <80% decrease in PCT             Encourage initiation of                                             antibiotics       Encourage continuation           of antibiotics    ----------------------------     -----------------------------        PCT >= 0.50 ng/mL                  PCT > 0.50 ng/mL               AND         increase in PCT                  Strongly encourage                                       initiation of antibiotics    Strongly encourage escalation           of antibiotics                                     -----------------------------                                           PCT <= 0.25 ng/mL                                                 OR                                        > 80% decrease in PCT  Discontinue / Do not initiate                                             antibiotics  Performed at [facility], [address], [place], [place] [address]   I-Stat CG4 Lactic Acid     Status: None   Collection Time: 01/07/24  5:49 AM  Result Value Ref Range   Lactic Acid, Venous 1.0 0.5 - 1.9 mmol/L    CT ABDOMEN PELVIS W CONTRAST Result Date: 01/07/2024 CLINICAL DATA:  43 year old female with abdominal pain on postoperative day 11 status post total abdominal hysterectomy, bilateral salpingectomy and lysis of adhesions. EXAM: CT ABDOMEN AND PELVIS WITH CONTRAST TECHNIQUE: Multidetector CT imaging of the abdomen and pelvis was performed using the standard protocol following bolus administration of intravenous contrast. RADIATION DOSE REDUCTION: This exam was performed according to the departmental dose-optimization program which includes automated exposure control, adjustment of the mA and/or kV according to patient size and/or use of iterative reconstruction technique. CONTRAST:  75mL OMNIPAQUE  IOHEXOL  350 MG/ML SOLN COMPARISON:  CT Abdomen and Pelvis 07/29/2007. FINDINGS: Lower chest: Chronic elevation of the left hemidiaphragm. Increased lingula and left lower lobe consolidation now. No pleural effusion. Platelike atelectasis in the right lower lobe and right middle lobe is less pronounced. Heart size remains normal. No pericardial effusion. Hepatobiliary: Cholecystectomy. Perihepatic fluid with mostly simple fluid density, small volume. No bile duct dilatation. Pancreas: Negative. Spleen: Perisplenic  fluid with mostly simple fluid density. No splenomegaly or splenic lesion. Adrenals/Urinary Tract: Left adrenal gland 18 Hounsfield unit rounded 2.8 cm nodule. This is new since 2009. Right adrenal gland is normal. Nonobstructed kidneys. Small benign appearing renal cysts (no follow-up imaging recommended). No delayed excretory images are provided. Urinary bladder is within normal limits. Stomach/Bowel: Ascites with simple fluid density, but early thickening and enhancement of the adjacent peritoneal cavity (such as in the left lower quadrant series 3, image 65 and see coronal image 65. Smaller volume similar fluid with early peritoneal thickening and enhancement contralateral right lower quadrant. And fairly abundant ascites in the left upper quadrant. Mild retained stool in the rectosigmoid colon. Redundant sigmoid tracking into the left mid abdomen. Descending colon mild retained stool. Adjacent fluid in the left pericolic gutter. Decompressed transverse colon. Mild right colon and hepatic flexure retained gas and stool. No discrete large bowel inflammation. Evidence of diminutive and normal appendix on coronal images 64 and 59. Fluid containing but nondilated terminal ileum, nondilated small bowel loops in the lower abdomen and at the pelvic inlet. Upstream of that several fluid-filled small bowel loops are mildly dilated measuring up to 3 cm diameter. And there is a fairly abrupt transition point to the decompressed distal small bowel on series 3, image 66 in the mid abdomen just caudal to the umbilicus. Overlying postoperative changes to the ventral abdominal wall there with mild soft tissue thickening and stranding. Trace midline rectus muscle postoperative gas on image 68. Some confluent edema in the panniculus there (series 3, image 78), but no organized or drainable abdominal wall fluid collection. Upstream small bowel loops are fluid-filled but mostly decompressed. Postoperative changes along the greater  curve of the stomach which is decompressed. Duodenum is decompressed. Low-density fluid in the bilateral upper abdomen, but no pneumoperitoneum identified. Vascular/Lymphatic: Major arterial structures in the abdomen and pelvis are patent and enhancing. Normal  caliber abdominal aorta. Portal venous system appears patent. No lymphadenopathy identified. Reproductive: Indistinct postoperative changes in the deep pelvis corresponding to the site of hysterectomy and salpingectomy. No discrete or measurable hematoma there. Other: No free fluid in the pelvis. Musculoskeletal: Interval lower lumbar and lumbosacral decompression and fusion. Posterior and interbody hardware in place. Severe adjacent segment disease. No acute or suspicious osseous lesion identified. IMPRESSION: 1. Postoperative changes to the lower abdominal wall, pelvis. Mild or early Small Bowel Obstruction with discrete transition point subjacent to the postoperative abdominal wall on series 3, image 66. Superimposed small to moderate volume free fluid in the abdomen and pelvis, mostly low fluid density. But areas of early adjacent peritoneal thickening and enhancement suggests the fluid is beginning to organize, developing abscess not excluded. Indistinct postoperative changes in the deep pelvis with no discrete or measurable hematoma there. 2. Kidneys and ureters appear nonobstructed. Bladder within normal limits. 3. Lung base atelectasis, developing left lower lobe pneumonia not excluded. No pleural effusion. 4. Incidental Left adrenal gland 2.8 cm nodule appears new since 2009. Recommend follow-up Adrenal Protocol CT or MRI (without and with contrast). Electronically Signed   By: VEAR Hurst M.D.   On: 01/07/2024 05:12    Review of Systems  Gastrointestinal:  Positive for abdominal distention, abdominal pain, nausea and vomiting.  All other systems reviewed and are negative.  Blood pressure 124/86, pulse 85, temperature 98 F (36.7 C), temperature  source Oral, resp. rate 15, last menstrual period 12/02/2023, SpO2 97%. Physical Exam Vitals reviewed.  Constitutional:      General: She is in acute distress (looks uncomfortable).     Appearance: She is obese. She is not ill-appearing.  HENT:     Head: Normocephalic and atraumatic.  Eyes:     General: No scleral icterus. Cardiovascular:     Rate and Rhythm: Normal rate and regular rhythm.     Heart sounds: Normal heart sounds. No murmur heard. Pulmonary:     Effort: Pulmonary effort is normal. No respiratory distress.     Breath sounds: Normal breath sounds.  Chest:     Chest wall: No tenderness.  Abdominal:     General: Abdomen is protuberant. Bowel sounds are decreased. There is distension.     Palpations: Abdomen is soft. There is no shifting dullness, fluid wave, hepatomegaly or splenomegaly.     Tenderness: There is abdominal tenderness (mild diffuse) in the periumbilical area. There is no guarding (voluntary) or rebound.     Hernia: No hernia is present. There is no hernia in the umbilical area or ventral area.  Skin:    General: Skin is warm and dry.     Capillary Refill: Capillary refill takes 2 to 3 seconds.     Coloration: Skin is not cyanotic, jaundiced, mottled or pale.  Neurological:     General: No focal deficit present.     Mental Status: She is alert and oriented to person, place, and time.  Psychiatric:        Mood and Affect: Mood normal. Mood is not anxious or depressed.        Behavior: Behavior normal.     Assessment/Plan: Post op state S/p TAH/BSO/LOA **Left adrenal nodule**  SBO vs ileus ? Developing abscess   NPO IV fluids  NGT Small bowel protocol.    Hopefully this will resolve with period of bowel rest and non operative tx.     Will need MR abd to eval adrenal nodule, but would wait until abdomen  is less uncomfortable   Jina LITTIE Nephew, MD, FACS, FSSO Surgical Oncology, General Surgery, Trauma and Critical [facility]  Surgery, [place] [phone] for weekday/non holidays Check [email or web address] for coverage night/weekend/holidays

## 2024-01-07 NOTE — ED Notes (Signed)
 Patient transported to CT

## 2024-01-07 NOTE — H&P (Signed)
 Ann [name] is an 43 y.o. female admitted with abdominal pain and fever POD11 s/p TAH, BS, extensive LOA for menorrhagia and pelvic pain; benign pathology.    Patient has h/o vWD and L/S gastric sleeve resection in 2022 at [facility].  Patient's postop course was complicated by difficulty with pain control, postop anemia and ileus.  She received 2u PRBCs postop and was discharged home on POD5.    Patient reports her incision is not the source of her her worst pain.  Since immediately postop, she has pain that comes and goes just to the right of her umbilicus.  She describes the pain as contraction like and severe.  She states yesterday, this pain worsened and was not controlled by po Dilaudid  she was discharge home with.  She also states she spike fever (Tm 100.5) last night before coming into ED.  Patient reports the pain episodes are so severe that she also has N/V; last emesis last night around 5 pm.  She has appetite but when she eats, she feels like the food will come back up.  Patient reports slow return of BMs but drank a Frapuccino 2 days ago and had large BM but watery.  Able to pass flatus but this is painful.  No vaginal bleeding.  In ED, patient is hemodynamically stable and AF.  HGB 10.5 and WBC 8.4.  Pertinent Gynecological History: Menses: n/a Bleeding: n/a Contraception: status post hysterectomy DES exposure: unknown Blood transfusions: 2u PRBCs 8/20 Sexually transmitted diseases: no past history Previous GYN Procedures: C/S x 2, endometrial ablation, D&C, TAH, BS  Last mammogram: normal Date: 12/15/23 Last pap: normal Date: 10/13/23 OB History: G3, P2012   Menstrual History: Menarche age: N/A Patient's last menstrual period was 12/02/2023 (approximate).    Past Medical History:  Diagnosis Date   ADD (attention deficit disorder)    takes Adderall daily   Anemia    Anxiety    takes Xanax  daily as needed   Arthritis    Asthma    Mild. Only flares up when sick    Bipolar disorder (HCC)    Blood dyscrasia    Von Willebrand   Chronic lower back pain    Clotting disorder (HCC)    Depression    has meds prescribed but doesn't take them   Dysrhythmia    Sinus Arrythmia   Family history of adverse reaction to anesthesia    Mom gets PONV too   GERD (gastroesophageal reflux disease)    Headache    monthly (02/27/2015)   Hemorrhoids    History of esophagogastroduodenoscopy (EGD)    Hypertension    Migraine    none in the last 6 months (02/27/2015)   Multiple sclerosis (HCC)    Nocturia    Pneumonia several times   PONV (postoperative nausea and vomiting)    Von Willebrand disease (HCC)    Dr. Freddie    Past Surgical History:  Procedure Laterality Date   ABDOMINAL HYSTERECTOMY     BREAST CYST EXCISION Left 02/20/2018   Procedure: EXCISION OF LEFT BREAST SEBACEOUS CYST;  Surgeon: Ebbie Cough, MD;  Location: [facility];  Service: General;  Laterality: Left;   BREAST REDUCTION SURGERY Bilateral 02/27/2015   Procedure: BILATERAL BREAST REDUCTION WITH FREE NIPPLE GRAFT TECHNIQUE ON RIGHT BREAST;  Surgeon: Alm Sick, MD;  Location: [facility] OR;  Service: Plastics;  Laterality: Bilateral;   CESAREAN SECTION  2004; 2013   2004, 2013   CHOLECYSTECTOMY  03/02/2012   Procedure:  LAPAROSCOPIC CHOLECYSTECTOMY WITH INTRAOPERATIVE CHOLANGIOGRAM;  Surgeon: Sherlean JINNY Laughter, MD;  Location: [facility] OR;  Service: General;  Laterality: N/A;   COLONOSCOPY     DILATION AND CURETTAGE OF UTERUS  ~ 1997; ~ 2005   DILITATION & CURRETTAGE/HYSTROSCOPY WITH THERMACHOICE ABLATION  04/28/2012   Procedure: DILATATION & CURETTAGE/HYSTEROSCOPY WITH THERMACHOICE ABLATION;  Surgeon: Rosaline LITTIE Cobble, MD;  Location: [facility] ORS;  Service: Gynecology;  Laterality: N/A;   ERCP  03/03/2012   Procedure: ENDOSCOPIC RETROGRADE CHOLANGIOPANCREATOGRAPHY (ERCP);  Surgeon: Belvie JONETTA Just, MD;  Location: [facility] ENDOSCOPY;  Service: Endoscopy;  Laterality: N/A;  dl/hung    HYSTERECTOMY ABDOMINAL WITH SALPINGECTOMY Bilateral 12/27/2023   Procedure: HYSTERECTOMY, TOTAL, ABDOMINAL, WITH SALPINGECTOMY;  Surgeon: Cobble Rosaline, MD;  Location: [facility] OR;  Service: Gynecology;  Laterality: Bilateral;   LAPAROSCOPIC GASTRIC SLEEVE RESECTION     with [facility]   LAPAROSCOPY  2011   LUMBAR DISC SURGERY  2003; 2008; 2009   MULTIPLE TOOTH EXTRACTIONS  scattered dates   POSTERIOR LUMBAR FUSION  2010   TUBAL LIGATION  2013   WISDOM TOOTH EXTRACTION  2001    Family History  Problem Relation Age of Onset   Colon polyps Mother    Stroke Mother    Arthritis Mother    Depression Mother    COPD Father    Kidney disease Paternal Uncle    Stroke Maternal Grandfather    Heart disease Paternal Grandfather    Multiple sclerosis Neg Hx    Colon cancer Neg Hx    Esophageal cancer Neg Hx    Rectal cancer Neg Hx    Stomach cancer Neg Hx     Social History:  reports that she quit smoking about 25 years ago. Her smoking use included cigarettes. She started smoking about 27 years ago. She has a 0.5 pack-year smoking history. She has never used smokeless tobacco. She reports current alcohol use. She reports that she does not use drugs.  Allergies:  Allergies  Allergen Reactions   Prochlorperazine  Other (See Comments)    Jaws locked up per pt   Codeine Itching    Able to take, but needs Benadryl    Darvocet [Propoxyphene N-Acetaminophen ] Nausea And Vomiting   Sulfa Antibiotics Rash   Sulfamethoxazole Rash    Medications Prior to Admission  Medication Sig Dispense Refill Last Dose/Taking   albuterol  (VENTOLIN  HFA) 108 (90 Base) MCG/ACT inhaler Inhale 1-2 puffs into the lungs every 6 (six) hours as needed for wheezing or shortness of breath. 18 g 0 Taking As Needed   ALPRAZolam  (XANAX ) 0.5 MG tablet Take 0.5 mg by mouth 2 (two) times daily as needed for anxiety.   01/05/2024   amLODipine  (NORVASC ) 5 MG tablet Take 5 mg by mouth in the morning.   01/01/2024    amphetamine -dextroamphetamine  (ADDERALL) 10 MG tablet Take 1 tablet (10 mg total) by mouth daily with breakfast. Take one pill  po  once or twice in the afternoon (Patient taking differently: Take 10 mg by mouth See admin instructions. Take 1 tablet (10 mg) by mouth scheduled in the morning, and may take 1-2 tablets (10 mg -20 mg) by mouth in the afternoon--if needed for attention/focus) 60 tablet 0 Past Week   FLUoxetine  (PROZAC ) 20 MG capsule Take 60 mg by mouth in the morning.   Past Week   HYDROmorphone  (DILAUDID ) 2 MG tablet Take 1 tablet (2 mg total) by mouth every 3 (three) hours as needed for severe pain (pain score 7-10) ((when tolerating fluids)). 30  tablet 0 01/06/2024 Evening   lisdexamfetamine (VYVANSE ) 60 MG capsule Take 1 capsule (60 mg total) by mouth every morning. 30 capsule 0 12/26/2023   pantoprazole  (PROTONIX ) 40 MG tablet Take 1 tablet (40 mg total) by mouth 2 (two) times daily. 60 tablet 3 01/06/2024 Evening   senna-docusate (SENOKOT-S) 8.6-50 MG tablet Take 1 tablet by mouth 2 (two) times daily as needed (constipation.).   01/01/2024   tiZANidine  (ZANAFLEX ) 4 MG tablet TAKE 1 TABLET(4 MG) BY MOUTH EVERY 8 HOURS AS NEEDED FOR MUSCLE SPASMS 90 tablet 1 01/05/2024   Ublituximab -xiiy (BRIUMVI  IV) Inject into the vein every 6 (six) months.   Unknown    Review of Systems  Blood pressure 135/82, pulse 92, temperature 98.2 F (36.8 C), temperature source Oral, resp. rate 15, last menstrual period 12/02/2023, SpO2 97%. Physical Exam Constitutional:      Appearance: She is well-developed.  HENT:     Head: Normocephalic and atraumatic.  Pulmonary:     Effort: Pulmonary effort is normal.  Abdominal:     General: Abdomen is flat.     Palpations: Abdomen is soft.     Tenderness: There is abdominal tenderness in the right upper quadrant and periumbilical area. There is no guarding or rebound.     Hernia: No hernia is present.     Comments: Pfannenstiel well healing with exception of  superficial dehiscence of right side of incision.  No erythema or discharge/drainage.  Musculoskeletal:        General: Normal range of motion.     Cervical back: Normal range of motion.  Skin:    General: Skin is warm and dry.  Neurological:     Mental Status: She is alert and oriented to person, place, and time.  Psychiatric:        Mood and Affect: Mood normal.        Behavior: Behavior normal.     Results for orders placed or performed during the hospital encounter of 01/06/24 (from the past 24 hours)  Urinalysis, Routine w reflex microscopic -Urine, Clean Catch     Status: Abnormal   Collection Time: 01/06/24 11:02 PM  Result Value Ref Range   Color, Urine YELLOW YELLOW   APPearance CLEAR CLEAR   Specific Gravity, Urine 1.019 1.005 - 1.030   pH 6.0 5.0 - 8.0   Glucose, UA NEGATIVE NEGATIVE mg/dL   Hgb urine dipstick SMALL (A) NEGATIVE   Bilirubin Urine NEGATIVE NEGATIVE   Ketones, ur NEGATIVE NEGATIVE mg/dL   Protein, ur NEGATIVE NEGATIVE mg/dL   Nitrite NEGATIVE NEGATIVE   Leukocytes,Ua NEGATIVE NEGATIVE   RBC / HPF 0-5 0 - 5 RBC/hpf   WBC, UA 6-10 0 - 5 WBC/hpf   Bacteria, UA RARE (A) NONE SEEN   Squamous Epithelial / HPF 0-5 0 - 5 /HPF   Mucus PRESENT   Lipase, blood     Status: None   Collection Time: 01/06/24 11:11 PM  Result Value Ref Range   Lipase 32 11 - 51 U/L  Comprehensive metabolic panel     Status: Abnormal   Collection Time: 01/06/24 11:11 PM  Result Value Ref Range   Sodium 141 135 - 145 mmol/L   Potassium 3.2 (L) 3.5 - 5.1 mmol/L   Chloride 106 98 - 111 mmol/L   CO2 26 22 - 32 mmol/L   Glucose, Bld 88 70 - 99 mg/dL   BUN 8 6 - 20 mg/dL   Creatinine, Ser 9.24 0.44 - 1.00 mg/dL  Calcium 9.1 8.9 - 10.3 mg/dL   Total Protein 5.6 (L) 6.5 - 8.1 g/dL   Albumin 3.5 3.5 - 5.0 g/dL   AST 19 15 - 41 U/L   ALT 21 0 - 44 U/L   Alkaline Phosphatase 61 38 - 126 U/L   Total Bilirubin 0.4 0.0 - 1.2 mg/dL   GFR, Estimated >39 >39 mL/min   Anion gap 9 5 -  15  CBC     Status: Abnormal   Collection Time: 01/06/24 11:11 PM  Result Value Ref Range   WBC 8.4 4.0 - 10.5 K/uL   RBC 3.35 (L) 3.87 - 5.11 MIL/uL   Hemoglobin 10.5 (L) 12.0 - 15.0 g/dL   HCT 67.0 (L) 63.9 - 53.9 %   MCV 98.2 80.0 - 100.0 fL   MCH 31.3 26.0 - 34.0 pg   MCHC 31.9 30.0 - 36.0 g/dL   RDW 83.9 (H) 88.4 - 84.4 %   Platelets 486 (H) 150 - 400 K/uL   nRBC 0.0 0.0 - 0.2 %  I-Stat CG4 Lactic Acid     Status: None   Collection Time: 01/07/24  3:29 AM  Result Value Ref Range   Lactic Acid, Venous 0.7 0.5 - 1.9 mmol/L  Procalcitonin     Status: None   Collection Time: 01/07/24  5:45 AM  Result Value Ref Range   Procalcitonin <0.10 ng/mL  I-Stat CG4 Lactic Acid     Status: None   Collection Time: 01/07/24  5:49 AM  Result Value Ref Range   Lactic Acid, Venous 1.0 0.5 - 1.9 mmol/L    CT ABDOMEN PELVIS W CONTRAST Result Date: 01/07/2024 CLINICAL DATA:  43 year old female with abdominal pain on postoperative day 11 status post total abdominal hysterectomy, bilateral salpingectomy and lysis of adhesions. EXAM: CT ABDOMEN AND PELVIS WITH CONTRAST TECHNIQUE: Multidetector CT imaging of the abdomen and pelvis was performed using the standard protocol following bolus administration of intravenous contrast. RADIATION DOSE REDUCTION: This exam was performed according to the departmental dose-optimization program which includes automated exposure control, adjustment of the mA and/or kV according to patient size and/or use of iterative reconstruction technique. CONTRAST:  75mL OMNIPAQUE  IOHEXOL  350 MG/ML SOLN COMPARISON:  CT Abdomen and Pelvis 07/29/2007. FINDINGS: Lower chest: Chronic elevation of the left hemidiaphragm. Increased lingula and left lower lobe consolidation now. No pleural effusion. Platelike atelectasis in the right lower lobe and right middle lobe is less pronounced. Heart size remains normal. No pericardial effusion. Hepatobiliary: Cholecystectomy. Perihepatic fluid with  mostly simple fluid density, small volume. No bile duct dilatation. Pancreas: Negative. Spleen: Perisplenic fluid with mostly simple fluid density. No splenomegaly or splenic lesion. Adrenals/Urinary Tract: Left adrenal gland 18 Hounsfield unit rounded 2.8 cm nodule. This is new since 2009. Right adrenal gland is normal. Nonobstructed kidneys. Small benign appearing renal cysts (no follow-up imaging recommended). No delayed excretory images are provided. Urinary bladder is within normal limits. Stomach/Bowel: Ascites with simple fluid density, but early thickening and enhancement of the adjacent peritoneal cavity (such as in the left lower quadrant series 3, image 65 and see coronal image 65. Smaller volume similar fluid with early peritoneal thickening and enhancement contralateral right lower quadrant. And fairly abundant ascites in the left upper quadrant. Mild retained stool in the rectosigmoid colon. Redundant sigmoid tracking into the left mid abdomen. Descending colon mild retained stool. Adjacent fluid in the left pericolic gutter. Decompressed transverse colon. Mild right colon and hepatic flexure retained gas and stool. No discrete  large bowel inflammation. Evidence of diminutive and normal appendix on coronal images 64 and 59. Fluid containing but nondilated terminal ileum, nondilated small bowel loops in the lower abdomen and at the pelvic inlet. Upstream of that several fluid-filled small bowel loops are mildly dilated measuring up to 3 cm diameter. And there is a fairly abrupt transition point to the decompressed distal small bowel on series 3, image 66 in the mid abdomen just caudal to the umbilicus. Overlying postoperative changes to the ventral abdominal wall there with mild soft tissue thickening and stranding. Trace midline rectus muscle postoperative gas on image 68. Some confluent edema in the panniculus there (series 3, image 78), but no organized or drainable abdominal wall fluid collection.  Upstream small bowel loops are fluid-filled but mostly decompressed. Postoperative changes along the greater curve of the stomach which is decompressed. Duodenum is decompressed. Low-density fluid in the bilateral upper abdomen, but no pneumoperitoneum identified. Vascular/Lymphatic: Major arterial structures in the abdomen and pelvis are patent and enhancing. Normal caliber abdominal aorta. Portal venous system appears patent. No lymphadenopathy identified. Reproductive: Indistinct postoperative changes in the deep pelvis corresponding to the site of hysterectomy and salpingectomy. No discrete or measurable hematoma there. Other: No free fluid in the pelvis. Musculoskeletal: Interval lower lumbar and lumbosacral decompression and fusion. Posterior and interbody hardware in place. Severe adjacent segment disease. No acute or suspicious osseous lesion identified. IMPRESSION: 1. Postoperative changes to the lower abdominal wall, pelvis. Mild or early Small Bowel Obstruction with discrete transition point subjacent to the postoperative abdominal wall on series 3, image 66. Superimposed small to moderate volume free fluid in the abdomen and pelvis, mostly low fluid density. But areas of early adjacent peritoneal thickening and enhancement suggests the fluid is beginning to organize, developing abscess not excluded. Indistinct postoperative changes in the deep pelvis with no discrete or measurable hematoma there. 2. Kidneys and ureters appear nonobstructed. Bladder within normal limits. 3. Lung base atelectasis, developing left lower lobe pneumonia not excluded. No pleural effusion. 4. Incidental Left adrenal gland 2.8 cm nodule appears new since 2009. Recommend follow-up Adrenal Protocol CT or MRI (without and with contrast). Electronically Signed   By: VEAR Hurst M.D.   On: 01/07/2024 05:12    Assessment/Plan: 43yo POD11 s/p TAH, BS, LOA with early pelvic abscess and possible SBO -NPO and IVF -Abx started in ED:  continue azith, ceftriaxone  and flagyl  -IV Dilaudid  prn pain -IV Phenergan  prn nausea/vomiting -Repeat CBC, CMP in am -Consult gen surg for assistance with management  Duwaine Blumenthal 01/07/2024, 12:59 PM

## 2024-01-07 NOTE — Progress Notes (Signed)
 Received call from hospitalist who states he does not understand why they would admit given postop gyn complication.  Admission was accepted by his night shift partner.  I will admit patient and consult hospitalist service prn.  Duwaine Blumenthal, DO

## 2024-01-07 NOTE — Consult Note (Addendum)
 Initial Consultation Note   Patient: Ann [name] FMW:[id number] DOB: 04-03-1981 PCP: Jolee Madelin Patch, MD DOA: 01/06/2024 DOS: the patient was seen and examined on 01/07/2024 Primary service: Claudene Maximino LABOR, MD  Referring physician:  Reason for consult: Medical management  Assessment/Plan: Assessment and Plan:  Small bowel obstruction Postoperative complication Possible abscess Patient just recently underwent total abdominal hysterectomy with bilateral salpingectomy and extensive lysis of adhesions on 8/19 by Dr. Mat.  She presents with complaints of lower abdominal pain.  CT concerning for small bowel obstruction with possible developing abscess.  Patient was placed on empiric antibiotics of Rocephin  and metronidazole  - N.p.o. - Check ESR and CRP - NGT to suction - Pain control - Would continue empiric antibiotics of Rocephin  and metronidazole  - Protonix  IV  Possible pneumonia CT scan noted bilateral lung atelectasis.  Patient denies having significant cough.  Low suspicion for pneumonia at this time, but is currently empirically covered with antibiotic regimen for postoperative wound infection. - Incentive spirometry - Check procalcitonin - Antibiotics as noted above  Hypokalemia Acute.  Initial potassium noted to be 3.3.  Patient had been given potassium chloride . - Continue to monitor and replace as needed  Postoperative anemia Hemoglobin 10.5 which appears improved from recent hospitalization.  Postoperatively patient's hemoglobin dropped down from 12.3 on 8/14->7.1 on 8/21, and patient was transfused 2 units of packed red blood cells. - Continue to monitor  Right adrenal nodule 2.8 cm right adrenal nodule noted on CT. - Will need MRI of the abdomen to further eval   TRH will continue to follow the patient.  HPI: Ann [name] is a 43 y.o. female with past medical history of von Willebrand disease, anxiety, depression, s/p gastric sleeve,  morbid obesity, fibroids, dysmenorrhea, and menorrhagia status post total abdominal hysterectomy with bilateral salpingectomy and extensive lysis of adhesions on 8/19 by Dr. Mat.  She presents to the hospital with complaints of abdominal pain.  Following the initial surgery on 8/19 it seems patient's postop course was complicated by ileus and anemia requiring 2 units of blood.  Patient had received daily Humate-P  in the morning of the surgery and postoperatively for her history of pulm Willebrand's disease.  She was discharged on January 01, 2024, despite not having a bowel movement and experiencing persistent pain.  Review of records note that she was able to pass flatus at that time.  Postoperatively, she experienced an absence of normal bowel movements. She had minimal oral intake due to a lack of appetite. Since discharge, she has had intermittent bowel movements, with the last one on Thursday, January 05, 2024.  She reports ongoing severe abdominal pain, described as 'like contractions,' localized to the lower abdomen around the surgical site. The pain is intense, lasting 3-4 minutes, and recurs every 5 to 20 minutes, significantly affecting her ability to breathe comfortably. She also experiences nausea and vomiting when attempting to eat, leading to reduced food intake and reliance on fluids.  On the night prior to this visit, she developed a fever of 100.2F, prompting her to seek medical attention. During a postoperative visit on Tuesday, January 03, 2024, it was noted that some of her surgical stitches had popped, and the area was glued. Despite this, she continued to experience significant pain.  In the ED patient was noted to be afebrile with mild tachypnea, blood pressures elevated up to 162/101, and all other vital signs maintained.  WBC 8.4, hemoglobin 10.5, platelets 486, potassium 3.2procalcitonin less than 10, and lactic acid  0.7.  CT scan of the abdomen pelvis noted postoperative changes  in the lower abdominal wall pelvis with mild or early small bowel obstruction with discrete transition point adjacent to postoperative abdominal wall, areas of early adjacent peritoneal thickening and enhancement, and lung base atelectasis/developing pneumonia not excluded, and incidental left adrenal gland 2.8 cm nodule new since 2009.  Review of Systems: As mentioned in the history of present illness. All other systems reviewed and are negative. Past Medical History:  Diagnosis Date   ADD (attention deficit disorder)    takes Adderall daily   Anemia    Anxiety    takes Xanax  daily as needed   Arthritis    Asthma    Mild. Only flares up when sick   Bipolar disorder (HCC)    Blood dyscrasia    Von Willebrand   Chronic lower back pain    Clotting disorder (HCC)    Depression    has meds prescribed but doesn't take them   Dysrhythmia    Sinus Arrythmia   Family history of adverse reaction to anesthesia    Mom gets PONV too   GERD (gastroesophageal reflux disease)    Headache    monthly (02/27/2015)   Hemorrhoids    History of esophagogastroduodenoscopy (EGD)    Hypertension    Migraine    none in the last 6 months (02/27/2015)   Multiple sclerosis (HCC)    Nocturia    Pneumonia several times   PONV (postoperative nausea and vomiting)    Von Willebrand disease (HCC)    Dr. Freddie   Past Surgical History:  Procedure Laterality Date   ABDOMINAL HYSTERECTOMY     BREAST CYST EXCISION Left 02/20/2018   Procedure: EXCISION OF LEFT BREAST SEBACEOUS CYST;  Surgeon: Ebbie Cough, MD;  Location: [facility];  Service: General;  Laterality: Left;   BREAST REDUCTION SURGERY Bilateral 02/27/2015   Procedure: BILATERAL BREAST REDUCTION WITH FREE NIPPLE GRAFT TECHNIQUE ON RIGHT BREAST;  Surgeon: Alm Sick, MD;  Location: [facility] OR;  Service: Plastics;  Laterality: Bilateral;   CESAREAN SECTION  2004; 2013   2004, 2013   CHOLECYSTECTOMY  03/02/2012    Procedure: LAPAROSCOPIC CHOLECYSTECTOMY WITH INTRAOPERATIVE CHOLANGIOGRAM;  Surgeon: Sherlean JINNY Laughter, MD;  Location: [facility] OR;  Service: General;  Laterality: N/A;   COLONOSCOPY     DILATION AND CURETTAGE OF UTERUS  ~ 1997; ~ 2005   DILITATION & CURRETTAGE/HYSTROSCOPY WITH THERMACHOICE ABLATION  04/28/2012   Procedure: DILATATION & CURETTAGE/HYSTEROSCOPY WITH THERMACHOICE ABLATION;  Surgeon: Rosaline LITTIE Cobble, MD;  Location: [facility];  Service: Gynecology;  Laterality: N/A;   ERCP  03/03/2012   Procedure: ENDOSCOPIC RETROGRADE CHOLANGIOPANCREATOGRAPHY (ERCP);  Surgeon: Belvie JONETTA Just, MD;  Location: [facility] END[facility]COPY;  Service: Endoscopy;  Laterality: N/A;  dl/hung   HYSTERECTOMY ABDOMINAL WITH SALPINGECTOMY Bilateral 12/27/2023   Procedure: HYSTERECTOMY, TOTAL, ABDOMINAL, WITH SALPINGECTOMY;  Surgeon: Cobble Rosaline, MD;  Location: [facility] OR;  Service: Gynecology;  Laterality: Bilateral;   LAPAROSCOPIC GASTRIC SLEEVE RESECTION     with [facility]   LAPAROSCOPY  2011   LUMBAR DISC SURGERY  2003; 2008; 2009   MULTIPLE TOOTH EXTRACTIONS  scattered dates   POSTERIOR LUMBAR FUSION  2010   TUBAL LIGATION  2013   WISDOM TOOTH EXTRACTION  2001   Social History:  reports that she quit smoking about 25 years ago. Her smoking use included cigarettes. She started smoking about 27 years ago. She has a 0.5 pack-year smoking history. She has never used  smokeless tobacco. She reports current alcohol use. She reports that she does not use drugs.  Allergies  Allergen Reactions   Prochlorperazine  Other (See Comments)    Jaws locked up per pt   Codeine Itching    Able to take, but needs Benadryl    Darvocet [Propoxyphene N-Acetaminophen ] Nausea And Vomiting   Sulfa Antibiotics Rash   Sulfamethoxazole Rash    Family History  Problem Relation Age of Onset   Colon polyps Mother    Stroke Mother    Arthritis Mother    Depression Mother    COPD Father    Kidney disease Paternal Uncle    Stroke Maternal  Grandfather    Heart disease Paternal Grandfather    Multiple sclerosis Neg Hx    Colon cancer Neg Hx    Esophageal cancer Neg Hx    Rectal cancer Neg Hx    Stomach cancer Neg Hx     Prior to Admission medications   Medication Sig Start Date End Date Taking? Authorizing Provider  acetaminophen  (TYLENOL ) 500 MG tablet Take 500 mg by mouth every 6 (six) hours as needed (pain.).   Yes [provider]  albuterol  (VENTOLIN  HFA) 108 (90 Base) MCG/ACT inhaler Inhale 1-2 puffs into the lungs every 6 (six) hours as needed for wheezing or shortness of breath. 06/12/23  Yes Raspet, Erin K, PA-C  ALPRAZolam  (XANAX ) 0.5 MG tablet Take 0.5 mg by mouth 2 (two) times daily as needed for anxiety. 09/27/22  Yes [provider]  amLODipine  (NORVASC ) 5 MG tablet Take 5 mg by mouth in the morning. 05/06/23  Yes [provider]  amphetamine -dextroamphetamine  (ADDERALL) 10 MG tablet Take 1 tablet (10 mg total) by mouth daily with breakfast. Take one pill  po  once or twice in the afternoon Patient taking differently: Take 10 mg by mouth See admin instructions. Take 1 tablet (10 mg) by mouth scheduled in the morning, and may take 1-2 tablets (10 mg -20 mg) by mouth in the afternoon--if needed for attention/focus 12/01/23  Yes Sater, Charlie LABOR, MD  diphenhydrAMINE -APAP, sleep, (GOODYS PM) 38-500 MG PACK Take 1 packet by mouth at bedtime as needed (sleep/pain.).   Yes [provider]  FLUoxetine  (PROZAC ) 20 MG capsule Take 60 mg by mouth in the morning. 12/14/19  Yes [provider]  HYDROmorphone  (DILAUDID ) 2 MG tablet Take 1 tablet (2 mg total) by mouth every 3 (three) hours as needed for severe pain (pain score 7-10) ((when tolerating fluids)). 01/01/24  Yes Mat Browning, MD  hydrOXYzine  (ATARAX ) 10 MG tablet Take 10 mg by mouth 3 (three) times daily as needed for itching.   Yes [provider]  lisdexamfetamine (VYVANSE ) 60 MG capsule Take 1 capsule (60 mg total) by  mouth every morning. 12/19/23  Yes Sater, Charlie LABOR, MD  pantoprazole  (PROTONIX ) 40 MG tablet Take 1 tablet (40 mg total) by mouth 2 (two) times daily. 01/01/24  Yes Mat Browning, MD  promethazine  (PHENERGAN ) 25 MG tablet Take 1 tablet (25 mg total) by mouth every 6 (six) hours as needed for nausea or vomiting. 01/01/24  Yes Mat Browning, MD  senna-docusate (SENOKOT-S) 8.6-50 MG tablet Take 1 tablet by mouth 2 (two) times daily as needed (constipation.). 11/07/20  Yes [provider]  tiZANidine  (ZANAFLEX ) 4 MG tablet TAKE 1 TABLET(4 MG) BY MOUTH EVERY 8 HOURS AS NEEDED FOR MUSCLE SPASMS 11/14/23  Yes Sater, Charlie LABOR, MD  Ublituximab -xiiy (BRIUMVI  IV) Inject into the vein every 6 (six) months.  Yes [provider]  Von Willebrand Factor , Recomb, (VONVENDI ) 650 units SOLR Vonvendi  1,300 (+/-) unit range intravenous solution   Yes [provider]  SEMAGLUTIDE-WEIGHT MANAGEMENT Pitman Inject 20 Units into the skin every Tuesday. 2.5 mg Patient not taking: Reported on 01/07/2024    [provider]  Spacer/Aero-Holding Chambers (AEROCHAMBER MV) inhaler Use as instructed 05/25/22   Van Knee, MD    Physical Exam: Vitals:   01/07/24 0645 01/07/24 0657 01/07/24 0700 01/07/24 0715  BP: (!) 151/93  (!) 130/101 (!) 144/87  Pulse: 85  87 87  Resp:    18  Temp:  99.4 F (37.4 C)  98.4 F (36.9 C)  TempSrc:    Oral  SpO2: 99%  97% 98%      Constitutional: Middle-aged female currently in no acute distress Eyes: PERRL, lids and conjunctivae normal ENMT: Mucous membranes are moist.   Neck: normal, supple  Respiratory: clear to auscultation bilaterally, no wheezing, no crackles. Normal respiratory effort.  Cardiovascular: Regular rate and rhythm, no murmurs / rubs / gallops. No extremity edema. 2+ pedal pulses. No carotid bruits.  Abdomen: Lower abdominal tenderness palpation.  mild dehiscence of the right side of the surgical incision  Musculoskeletal: no  clubbing / cyanosis. No joint deformity upper and lower extremities. Good ROM, no contractures. Normal muscle tone.  Skin: Surgical wounds noted above Neurologic: CN 2-12 grossly intact.  Strength 5/5 in all 4.  Psychiatric: Normal judgment and insight. Alert and oriented x 3. Normal mood.   Data Reviewed:   Reviewed labs, imaging, and pertinent records as documented   Family Communication:  Primary team communication:  Thank you very much for involving us  in the care of your patient.  Author: Maximino DELENA Sharps, MD 01/07/2024 8:21 AM  For on call review [email or web address].

## 2024-01-08 ENCOUNTER — Inpatient Hospital Stay (HOSPITAL_COMMUNITY)

## 2024-01-08 LAB — CBC
HCT: 31.6 % — ABNORMAL LOW (ref 36.0–46.0)
Hemoglobin: 10 g/dL — ABNORMAL LOW (ref 12.0–15.0)
MCH: 31.2 pg (ref 26.0–34.0)
MCHC: 31.6 g/dL (ref 30.0–36.0)
MCV: 98.4 fL (ref 80.0–100.0)
Platelets: 478 K/uL — ABNORMAL HIGH (ref 150–400)
RBC: 3.21 MIL/uL — ABNORMAL LOW (ref 3.87–5.11)
RDW: 15.6 % — ABNORMAL HIGH (ref 11.5–15.5)
WBC: 8.5 K/uL (ref 4.0–10.5)
nRBC: 0 % (ref 0.0–0.2)

## 2024-01-08 LAB — COMPREHENSIVE METABOLIC PANEL WITH GFR
ALT: 15 U/L (ref 0–44)
AST: 14 U/L — ABNORMAL LOW (ref 15–41)
Albumin: 3.1 g/dL — ABNORMAL LOW (ref 3.5–5.0)
Alkaline Phosphatase: 57 U/L (ref 38–126)
Anion gap: 7 (ref 5–15)
BUN: <5 mg/dL — ABNORMAL LOW (ref 6–20)
CO2: 28 mmol/L (ref 22–32)
Calcium: 9.1 mg/dL (ref 8.9–10.3)
Chloride: 104 mmol/L (ref 98–111)
Creatinine, Ser: 0.56 mg/dL (ref 0.44–1.00)
GFR, Estimated: >60 mL/min (ref >60)
Glucose, Bld: 88 mg/dL (ref 70–99)
Potassium: 3.6 mmol/L (ref 3.5–5.1)
Sodium: 139 mmol/L (ref 135–145)
Total Bilirubin: 0.2 mg/dL (ref 0.0–1.2)
Total Protein: 6 g/dL — ABNORMAL LOW (ref 6.5–8.1)

## 2024-01-08 MED ORDER — SODIUM CHLORIDE 0.9 % IV SOLN
2.0000 g | INTRAVENOUS | Status: DC
  Administered 2024-01-08 – 2024-01-13 (×6): 2 g via INTRAVENOUS
  Filled 2024-01-08 (×6): qty 20

## 2024-01-08 MED ORDER — DEXTROSE IN LACTATED RINGERS 5 % IV SOLN: INTRAVENOUS | Status: DC

## 2024-01-08 MED ORDER — AMLODIPINE BESYLATE 10 MG PO TABS
10.0000 mg | ORAL_TABLET | Freq: Every morning | ORAL | Status: DC
Start: 2024-01-08 — End: 2024-01-09
  Administered 2024-01-08: 10 mg via ORAL
  Filled 2024-01-08: qty 1

## 2024-01-08 MED ORDER — METRONIDAZOLE 500 MG/100ML IV SOLN
500.0000 mg | Freq: Two times a day (BID) | INTRAVENOUS | Status: DC
  Administered 2024-01-08 – 2024-01-13 (×10): 500 mg via INTRAVENOUS
  Filled 2024-01-08 (×10): qty 100

## 2024-01-08 NOTE — Progress Notes (Signed)
 Progress Note     Subjective: Patient reports generalized abdominal pain. She feels that it has improved since yesterday. Having some nausea. Denies vomiting. Denies BM or flatulence.  ROS  All negative with the exception of above.  Objective: Vital signs in last 24 hours: Temp:  [98 F (36.7 C)-98.2 F (36.8 C)] 98.1 F (36.7 C) (08/31 0811) Pulse Rate:  [82-98] 82 (08/31 1035) Resp:  [15-18] 17 (08/31 1035) BP: (124-159)/(82-100) 134/89 (08/31 1046) SpO2:  [95 %-100 %] 97 % (08/31 0811) Last BM Date : 12/29/23  Intake/Output from previous day: 08/30 0701 - 08/31 0700 In: 1816.8 [I.V.:390.9; IV Piggyback:1425.9] Out: 120 [Emesis/NG output:120] Intake/Output this shift: No intake/output data recorded.  PE: General: Pleasant female who is laying in bed in NAD. HEENT: Head is normocephalic, atraumatic.  Sclera are noninjected. Conjunctiva anicteric. EOMI. NGT noted at nare Lungs: Respiratory effort nonlabored Abd: Soft, ND. Generalized tenderness to deep palpation. No rebound tenderness or guarding. Psych: A&Ox3 with an appropriate affect.    Lab Results:  Recent Labs    01/06/24 2311 01/08/24 0505  WBC 8.4 8.5  HGB 10.5* 10.0*  HCT 32.9* 31.6*  PLT 486* 478*   BMET Recent Labs    01/06/24 2311 01/08/24 0505  NA 141 139  K 3.2* 3.6  CL 106 104  CO2 26 28  GLUCOSE 88 88  BUN 8 <5*  CREATININE 0.75 0.56  CALCIUM 9.1 9.1   PT/INR No results for input(s): LABPROT, INR in the last 72 hours. CMP     Component Value Date/Time   NA 139 01/08/2024 0505   NA 150 (H) 01/01/2019 1635   K 3.6 01/08/2024 0505   CL 104 01/08/2024 0505   CO2 28 01/08/2024 0505   GLUCOSE 88 01/08/2024 0505   BUN <5 (L) 01/08/2024 0505   BUN 7 01/01/2019 1635   CREATININE 0.56 01/08/2024 0505   CREATININE 0.78 03/25/2020 1135   CREATININE 0.59 09/07/2011 1801   CALCIUM 9.1 01/08/2024 0505   PROT 6.0 (L) 01/08/2024 0505   PROT 7.6 01/01/2019 1635   ALBUMIN 3.1 (L)  01/08/2024 0505   ALBUMIN 4.5 01/01/2019 1635   AST 14 (L) 01/08/2024 0505   AST 15 03/25/2020 1135   ALT 15 01/08/2024 0505   ALT 12 03/25/2020 1135   ALKPHOS 57 01/08/2024 0505   BILITOT 0.2 01/08/2024 0505   BILITOT 0.5 03/25/2020 1135   GFRNONAA >60 01/08/2024 0505   GFRNONAA >60 03/25/2020 1135   GFRAA >60 01/16/2019 1137   Lipase     Component Value Date/Time   LIPASE 32 01/06/2024 2311       Studies/Results: DG Abd Portable 1V-Small Bowel Obstruction Protocol-initial, 8 hr delay Result Date: 01/08/2024 EXAM: 1 VIEW XRAY OF THE ABDOMEN 01/08/2024 07:41:00 AM COMPARISON: Previous exam from today. CLINICAL HISTORY: Small bowel obstruction. FINDINGS: BOWEL: Oral contrast material progression to the nondilated right colon. SOFT TISSUES: Gastric tube remains in the decompressed stomach. Cholecystectomy clips. BONES: Lumbosacral fixation hardware. Bilateral hip degenerative joint disease. IMPRESSION: 1. No bowel obstruction. Electronically signed by: Katheleen Faes MD 01/08/2024 10:07 AM EDT RP Workstation: [id number]   DG Abd Portable 1V-Small Bowel Protocol-Position Verification Result Date: 01/07/2024 CLINICAL DATA:  Nasogastric tube placement. EXAM: PORTABLE ABDOMEN - 1 VIEW COMPARISON:  Abdominopelvic CT earlier today FINDINGS: Tip and side port of the enteric tube below the diaphragm in the stomach. Few prominent loops of air-filled small bowel in the upper abdomen. IMPRESSION: Tip and side port of the enteric  tube below the diaphragm in the stomach. Electronically Signed   By: Andrea Gasman M.D.   On: 01/07/2024 19:19   CT ABDOMEN PELVIS W CONTRAST Result Date: 01/07/2024 CLINICAL DATA:  43 year old female with abdominal pain on postoperative day 11 status post total abdominal hysterectomy, bilateral salpingectomy and lysis of adhesions. EXAM: CT ABDOMEN AND PELVIS WITH CONTRAST TECHNIQUE: Multidetector CT imaging of the abdomen and pelvis was performed using the standard  protocol following bolus administration of intravenous contrast. RADIATION DOSE REDUCTION: This exam was performed according to the departmental dose-optimization program which includes automated exposure control, adjustment of the mA and/or kV according to patient size and/or use of iterative reconstruction technique. CONTRAST:  75mL OMNIPAQUE  IOHEXOL  350 MG/ML SOLN COMPARISON:  CT Abdomen and Pelvis 07/29/2007. FINDINGS: Lower chest: Chronic elevation of the left hemidiaphragm. Increased lingula and left lower lobe consolidation now. No pleural effusion. Platelike atelectasis in the right lower lobe and right middle lobe is less pronounced. Heart size remains normal. No pericardial effusion. Hepatobiliary: Cholecystectomy. Perihepatic fluid with mostly simple fluid density, small volume. No bile duct dilatation. Pancreas: Negative. Spleen: Perisplenic fluid with mostly simple fluid density. No splenomegaly or splenic lesion. Adrenals/Urinary Tract: Left adrenal gland 18 Hounsfield unit rounded 2.8 cm nodule. This is new since 2009. Right adrenal gland is normal. Nonobstructed kidneys. Small benign appearing renal cysts (no follow-up imaging recommended). No delayed excretory images are provided. Urinary bladder is within normal limits. Stomach/Bowel: Ascites with simple fluid density, but early thickening and enhancement of the adjacent peritoneal cavity (such as in the left lower quadrant series 3, image 65 and see coronal image 65. Smaller volume similar fluid with early peritoneal thickening and enhancement contralateral right lower quadrant. And fairly abundant ascites in the left upper quadrant. Mild retained stool in the rectosigmoid colon. Redundant sigmoid tracking into the left mid abdomen. Descending colon mild retained stool. Adjacent fluid in the left pericolic gutter. Decompressed transverse colon. Mild right colon and hepatic flexure retained gas and stool. No discrete large bowel inflammation.  Evidence of diminutive and normal appendix on coronal images 64 and 59. Fluid containing but nondilated terminal ileum, nondilated small bowel loops in the lower abdomen and at the pelvic inlet. Upstream of that several fluid-filled small bowel loops are mildly dilated measuring up to 3 cm diameter. And there is a fairly abrupt transition point to the decompressed distal small bowel on series 3, image 66 in the mid abdomen just caudal to the umbilicus. Overlying postoperative changes to the ventral abdominal wall there with mild soft tissue thickening and stranding. Trace midline rectus muscle postoperative gas on image 68. Some confluent edema in the panniculus there (series 3, image 78), but no organized or drainable abdominal wall fluid collection. Upstream small bowel loops are fluid-filled but mostly decompressed. Postoperative changes along the greater curve of the stomach which is decompressed. Duodenum is decompressed. Low-density fluid in the bilateral upper abdomen, but no pneumoperitoneum identified. Vascular/Lymphatic: Major arterial structures in the abdomen and pelvis are patent and enhancing. Normal caliber abdominal aorta. Portal venous system appears patent. No lymphadenopathy identified. Reproductive: Indistinct postoperative changes in the deep pelvis corresponding to the site of hysterectomy and salpingectomy. No discrete or measurable hematoma there. Other: No free fluid in the pelvis. Musculoskeletal: Interval lower lumbar and lumbosacral decompression and fusion. Posterior and interbody hardware in place. Severe adjacent segment disease. No acute or suspicious osseous lesion identified. IMPRESSION: 1. Postoperative changes to the lower abdominal wall, pelvis. Mild or early  Small Bowel Obstruction with discrete transition point subjacent to the postoperative abdominal wall on series 3, image 66. Superimposed small to moderate volume free fluid in the abdomen and pelvis, mostly low fluid  density. But areas of early adjacent peritoneal thickening and enhancement suggests the fluid is beginning to organize, developing abscess not excluded. Indistinct postoperative changes in the deep pelvis with no discrete or measurable hematoma there. 2. Kidneys and ureters appear nonobstructed. Bladder within normal limits. 3. Lung base atelectasis, developing left lower lobe pneumonia not excluded. No pleural effusion. 4. Incidental Left adrenal gland 2.8 cm nodule appears new since 2009. Recommend follow-up Adrenal Protocol CT or MRI (without and with contrast). Electronically Signed   By: VEAR Hurst M.D.   On: 01/07/2024 05:12    Anti-infectives: Anti-infectives (From admission, onward)    Start     Dose/Rate Route Frequency Ordered Stop   01/07/24 0900  metroNIDAZOLE  (FLAGYL ) IVPB 500 mg        500 mg 100 mL/hr over 60 Minutes Intravenous Every 12 hours 01/07/24 0818     01/07/24 0530  cefTRIAXone  (ROCEPHIN ) 2 g in sodium chloride  0.9 % 100 mL IVPB        2 g 200 mL/hr over 30 Minutes Intravenous  Once 01/07/24 0518 01/07/24 0718   01/07/24 0530  azithromycin  (ZITHROMAX ) 500 mg in sodium chloride  0.9 % 250 mL IVPB        500 mg 250 mL/hr over 60 Minutes Intravenous  Once 01/07/24 0518 01/07/24 0836        Assessment/Plan Post op state: S/p TAH/BSO/LOA   Readmit for SBO vs ileus ? Developing abscess  -Vitals stable. Afebrile -WBC 8.5 from 8.4 -HGB 10.0 from 10.5 -DG Abd SBO protocol initial showed oral contrast material progression to the nondilated right colon. No bowel obstruction. -NGT with 100 mL in cannister during encounter. 120 total output recorded from 8/30-8/31. Still having pain and nausea. No flatulence or BM. Continue NPO. Will discuss with MD removal of NGT and when to advance diet. -Will continue to follow  Left adrenal nodule - Will need MR abd to eval adrenal nodule, but should wait until abdomen is less uncomfortable    FEN: NGT, NPO; IV fluids per primary  team VTE: SCDs ID: Metronidazole  currently     LOS: 1 day   I reviewed specialist notes, nursing notes, last 24 h vitals and pain scores, last 48 h intake and output, last 24 h labs and trends, and last 24 h imaging results.  This care required moderate level of medical decision making.    Marjorie Carlyon Favre, [facility] 01/08/2024, 11:04 AM Please see Amion for pager number during day hours 7:00am-4:30pm

## 2024-01-08 NOTE — Progress Notes (Addendum)
 Subjective: Patient reports decreased frequency of pain episodes; Dilaudid  helps.  Nausea continues when pain is present only.  No vomiting.  No BM.    Objective: I have reviewed patient's vital signs, intake and output, medications, and labs.     Latest Ref Rng & Units 01/08/2024    5:05 AM 01/06/2024   11:11 PM 01/01/2024    4:31 AM  CBC  WBC 4.0 - 10.5 K/uL 8.5  8.4  10.9   Hemoglobin 12.0 - 15.0 g/dL 89.9  89.4  9.0   Hematocrit 36.0 - 46.0 % 31.6  32.9  27.9   Platelets 150 - 400 K/uL 478  486  349       Latest Ref Rng & Units 01/08/2024    5:05 AM 01/06/2024   11:11 PM 12/22/2023    3:30 PM  CMP  Glucose 70 - 99 mg/dL 88  88  87   BUN 6 - 20 mg/dL 5  8  5    Creatinine 0.44 - 1.00 mg/dL 9.43  9.24  9.23   Sodium 135 - 145 mmol/L 139  141  138   Potassium 3.5 - 5.1 mmol/L 3.6  3.2  4.0   Chloride 98 - 111 mmol/L 104  106  105   CO2 22 - 32 mmol/L 28  26  24    Calcium 8.9 - 10.3 mg/dL 9.1  9.1  9.5   Total Protein 6.5 - 8.1 g/dL 6.0  5.6    Total Bilirubin 0.0 - 1.2 mg/dL 0.2  0.4    Alkaline Phos 38 - 126 U/L 57  61    AST 15 - 41 U/L 14  19    ALT 0 - 44 U/L 15  21     120 cc NGT output  General: alert, cooperative, and appears stated age Extremities: extremities normal, atraumatic, no cyanosis or edema Abd: soft, well healing Pfannenstiel with exception of right incisional superficial dehiscence.  Pain continues to be just to the right of umbilicus.   Assessment/Plan: 43yo POD12 s/p TAH, BS, LOA with early pelvic abscess and possible SBO -Appreciate Gen Surg involvement and recs -NGT, NPO and IVF -Abx D2: continue ceftriaxone  and flagyl  -IV Dilaudid  prn pain -IV Phenergan  prn nausea/vomiting -Continue daily CBC, CMP   LOS: 1 day    Duwaine Blumenthal, DO 01/08/2024, 11:02 AM

## 2024-01-08 NOTE — Plan of Care (Signed)
  Problem: Education: Goal: Knowledge of General Education information will improve Description: Including pain rating scale, medication(s)/side effects and non-pharmacologic comfort measures Outcome: Progressing   Problem: Clinical Measurements: Goal: Ability to maintain clinical measurements within normal limits will improve Outcome: Progressing   Problem: Activity: Goal: Risk for activity intolerance will decrease Outcome: Progressing   Problem: Nutrition: Goal: Adequate nutrition will be maintained Outcome: Progressing   Problem: Coping: Goal: Level of anxiety will decrease Outcome: Progressing   Problem: Pain Managment: Goal: General experience of comfort will improve and/or be controlled Outcome: Progressing

## 2024-01-08 NOTE — Consult Note (Signed)
   CONSULT FOLLOW UP NOTE  Ann [name]  FMW:[id number] DOB: 12/03/1980 DOA: 01/06/2024 PCP: Jolee Madelin Patch, MD    Brief Narrative:  43 year old with a history of von Willebrand's disease, anxiety/depression, morbid obesity status post gastric sleeve, and dysmenorrhea and menorrhagia with fibroids who underwent bilateral salpingectomy and total abdominal hysterectomy with extensive LOA 8/19 per Dr. Mat, with her postoperative course complicated by ileus as well as anemia requiring blood transfusion.  She was discharged from the hospital 01/01/2024, and returned to the ER 8/30 with complaints of persistent severe abdominal pain.  This was associated with nausea and vomiting anytime she attempted to eat.  CT abd/pelvis in the ER were suggestive of early SBO with a discrete transition point adjacent to the postoperative abdominal wall.  Incidentally noted was a 2.8 cm R adrenal nodule new since previous imaging 2009.  Goals of Care:   Code Status: Full Code   DVT prophylaxis: SCDs Start: 01/07/24 0917   Interim Hx: Afebrile since admission.  Blood pressure modestly elevated.  Oxygen saturation stable at 97%.  Electrolytes balanced.  Hemoglobin stable.  Assessment & Plan:  Small bowel obstruction v/s ileus with possible developing abscess - s/p total abdominal hysterectomy with bilateral salpingectomy and extensive LOA 12/27/2023 Care per General Surgery - NG in place - on empiric antibiotic  Bibasilar atelectasis Noted incidentally on CT abd/pelvis -no indication of acute infectious lung disease  Postoperative anemia - von Willebrand's disease Hemoglobin appears stable at this time  Incidentally noted 2.8 cm LEFT adrenal nodule New since prior imaging 2009 - will need eventual MRI abdomen (with and without contrast) for more complete evaluation once above issues have settled   Disposition:  per primary team (OB/GYN)   Objective: Blood pressure (!) 159/100, pulse 86,  temperature 98.1 F (36.7 C), temperature source Oral, resp. rate 16, last menstrual period 12/02/2023, SpO2 97%.  Intake/Output Summary (Last 24 hours) at 01/08/2024 0917 Last data filed at 01/08/2024 0443 Gross per 24 hour  Intake 466.8 ml  Output 120 ml  Net 346.8 ml   There were no vitals filed for this visit.  Examination: All active problems today being managed by primary service and General Surgery -no medical exam indicated today -will continue to follow along with you as a consultant to monitor her chronic medical problems  CBC: Recent Labs  Lab 01/06/24 2311 01/08/24 0505  WBC 8.4 8.5  HGB 10.5* 10.0*  HCT 32.9* 31.6*  MCV 98.2 98.4  PLT 486* 478*   Basic Metabolic Panel: Recent Labs  Lab 01/06/24 2311 01/08/24 0505  NA 141 139  K 3.2* 3.6  CL 106 104  CO2 26 28  GLUCOSE 88 88  BUN 8 <5*  CREATININE 0.75 0.56  CALCIUM 9.1 9.1   GFR: Estimated Creatinine Clearance: 89.3 mL/min (by C-G formula based on SCr of 0.56 mg/dL).   Scheduled Meds:  amLODipine   5 mg Oral q AM   FLUoxetine   60 mg Oral q AM   pantoprazole  (PROTONIX ) IV  40 mg Intravenous QHS   Continuous Infusions:  dextrose  5% lactated ringers  125 mL/hr at 01/08/24 0214   metronidazole  500 mg (01/08/24 0850)   promethazine  (PHENERGAN ) injection (IM or IVPB) 25 mg (01/08/24 0042)     LOS: 1 day   Reyes IVAR Moores, MD [facility] Office  [phone] Pager - Text Page per Tracey  If 7PM-7AM, please contact night-coverage per Amion 01/08/2024, 9:17 AM

## 2024-01-08 NOTE — Progress Notes (Signed)
 Gastrografin  dosage time was adjusted. NG tube placement had to be verified. The result was received after 2000. Then suction needed to run for at least 2 hours per instructions, before medication was given. Medication administration was started at 2343 and radiology was notified of the start time. Spoke with Adrien in [place].

## 2024-01-08 NOTE — Plan of Care (Signed)
  Problem: Education: Goal: Knowledge of General Education information will improve Description: Including pain rating scale, medication(s)/side effects and non-pharmacologic comfort measures Outcome: Progressing   Problem: Clinical Measurements: Goal: Will remain free from infection Outcome: Progressing   Problem: Activity: Goal: Risk for activity intolerance will decrease Outcome: Not Progressing   Problem: Nutrition: Goal: Adequate nutrition will be maintained Outcome: Not Progressing   Problem: Coping: Goal: Level of anxiety will decrease Outcome: Not Progressing

## 2024-01-08 NOTE — Progress Notes (Signed)
 Resumed low intermittent suction of NG tube at 0100.

## 2024-01-09 DIAGNOSIS — T8149XA Infection following a procedure, other surgical site, initial encounter: Secondary | ICD-10-CM | POA: Diagnosis not present

## 2024-01-09 LAB — COMPREHENSIVE METABOLIC PANEL WITH GFR
ALT: 17 U/L (ref 0–44)
AST: 15 U/L (ref 15–41)
Albumin: 3.6 g/dL (ref 3.5–5.0)
Alkaline Phosphatase: 60 U/L (ref 38–126)
Anion gap: 12 (ref 5–15)
BUN: <5 mg/dL — ABNORMAL LOW (ref 6–20)
CO2: 26 mmol/L (ref 22–32)
Calcium: 9.3 mg/dL (ref 8.9–10.3)
Chloride: 103 mmol/L (ref 98–111)
Creatinine, Ser: 0.66 mg/dL (ref 0.44–1.00)
GFR, Estimated: >60 mL/min (ref >60)
Glucose, Bld: 84 mg/dL (ref 70–99)
Potassium: 3.4 mmol/L — ABNORMAL LOW (ref 3.5–5.1)
Sodium: 141 mmol/L (ref 135–145)
Total Bilirubin: 0.6 mg/dL (ref 0.0–1.2)
Total Protein: 6.7 g/dL (ref 6.5–8.1)

## 2024-01-09 LAB — CBC
HCT: 33.6 % — ABNORMAL LOW (ref 36.0–46.0)
Hemoglobin: 10.9 g/dL — ABNORMAL LOW (ref 12.0–15.0)
MCH: 31.4 pg (ref 26.0–34.0)
MCHC: 32.4 g/dL (ref 30.0–36.0)
MCV: 96.8 fL (ref 80.0–100.0)
Platelets: 482 K/uL — ABNORMAL HIGH (ref 150–400)
RBC: 3.47 MIL/uL — ABNORMAL LOW (ref 3.87–5.11)
RDW: 15.4 % (ref 11.5–15.5)
WBC: 8.7 K/uL (ref 4.0–10.5)
nRBC: 0 % (ref 0.0–0.2)

## 2024-01-09 LAB — PHOSPHORUS: Phosphorus: 3.3 mg/dL (ref 2.5–4.6)

## 2024-01-09 LAB — MAGNESIUM: Magnesium: 2.2 mg/dL (ref 1.7–2.4)

## 2024-01-09 MED ORDER — AMLODIPINE BESYLATE 10 MG PO TABS
10.0000 mg | ORAL_TABLET | Freq: Every day | ORAL | Status: DC
  Administered 2024-01-10 – 2024-01-13 (×4): 10 mg via ORAL
  Filled 2024-01-09 (×4): qty 1

## 2024-01-09 MED ORDER — POTASSIUM CHLORIDE 10 MEQ/100ML IV SOLN
10.0000 meq | INTRAVENOUS | Status: AC
  Administered 2024-01-09 (×3): 10 meq via INTRAVENOUS
  Filled 2024-01-09 (×3): qty 100

## 2024-01-09 MED ORDER — KCL IN DEXTROSE-NACL 20-5-0.9 MEQ/L-%-% IV SOLN
INTRAVENOUS | Status: DC
  Filled 2024-01-09 (×2): qty 1000

## 2024-01-09 MED ORDER — POTASSIUM CHLORIDE 10 MEQ/100ML IV SOLN
10.0000 meq | Freq: Once | INTRAVENOUS | Status: AC
  Administered 2024-01-09: 10 meq via INTRAVENOUS
  Filled 2024-01-09: qty 100

## 2024-01-09 NOTE — Plan of Care (Signed)
°  Problem: Health Behavior/Discharge Planning: Goal: Ability to manage health-related needs will improve Outcome: Progressing   Problem: Clinical Measurements: Goal: Ability to maintain clinical measurements within normal limits will improve Outcome: Progressing   Problem: Activity: Goal: Risk for activity intolerance will decrease Outcome: Progressing   Problem: Nutrition: Goal: Adequate nutrition will be maintained Outcome: Progressing   Problem: Elimination: Goal: Will not experience complications related to bowel motility Outcome: Progressing

## 2024-01-09 NOTE — Progress Notes (Signed)
 Per shift report and per patient, she had one episode of vomiting today around noon. She stated that she felt it was because she had an upsetting incident right before that time with a family member. That caused her to start crying, etc and she feels that is what caused her to throw up. She was given phenergan  prn as soon as it was obtained from pharmacy, and reports at this time that she has had no further nausea or vomiting. Patient also reports that she has now had a bowel movement. (2345) Requested a xanax  to help her sleep which has been given and NG tube clamped at 2350

## 2024-01-09 NOTE — Progress Notes (Signed)
 NG tube placement verified, connected back to low intermittent suction.

## 2024-01-09 NOTE — Progress Notes (Signed)
 Patient NG tube pulled out some d/t vomiting. NG tube measuring at 30 cm, original placement at 58 cm. NG tube advanced back to original placement. MD made aware and X-ray order has been placed for placement verification. NG tube currently clamped awaiting verification.

## 2024-01-09 NOTE — Plan of Care (Signed)
  Problem: Activity: Goal: Risk for activity intolerance will decrease Outcome: Progressing   Problem: Coping: Goal: Level of anxiety will decrease Outcome: Progressing   Problem: Elimination: Goal: Will not experience complications related to bowel motility Outcome: Progressing   Problem: Pain Managment: Goal: General experience of comfort will improve and/or be controlled Outcome: Not Progressing

## 2024-01-09 NOTE — Consult Note (Signed)
 CONSULT FOLLOW UP NOTE  Ann [name]  FMW:[id number] DOB: November 05, 1980 DOA: 01/06/2024 PCP: Jolee Madelin Patch, MD    Brief Narrative:  43 year old with a history of von Willebrand's disease, anxiety/depression, morbid obesity status post gastric sleeve, and dysmenorrhea and menorrhagia with fibroids who underwent bilateral salpingectomy and total abdominal hysterectomy with extensive LOA 8/19 per Dr. Mat, with her postoperative course complicated by ileus as well as anemia requiring blood transfusion.  She was discharged from the hospital 01/01/2024, and returned to the ER 8/30 with complaints of persistent severe abdominal pain.  This was associated with nausea and vomiting anytime she attempted to eat.  CT abd/pelvis in the ER were suggestive of early SBO with a discrete transition point adjacent to the postoperative abdominal wall.  Incidentally noted was a 2.8 cm R adrenal nodule new since previous imaging 2009.  Goals of Care:   Code Status: Full Code   DVT prophylaxis: SCDs Start: 01/07/24 0917   Interim Hx: Afebrile.  Vital signs stable.  Potassium mildly low at 3.4.  Magnesium  normal.  Resting comfortably in bed.  No new complaints at the time of my visit today.  Is thus far tolerating NG clamping trial well.  Assessment & Plan:  Small bowel obstruction v/s ileus with possible developing abscess - s/p total abdominal hysterectomy with bilateral salpingectomy and extensive LOA 12/27/2023 Care per General Surgery - NG in place - on empiric antibiotic  Bibasilar atelectasis Noted incidentally on CT abd/pelvis -no indication of acute infectious lung disease -encourage use of IS   Postoperative anemia - von Willebrand's disease Hemoglobin stable at this time  Incidentally noted 2.8 cm LEFT adrenal nodule New since prior imaging 2009 - will need MRI abdomen (with and without contrast) for more complete evaluation once above issues have settled   Disposition:  per primary  team (OB/GYN)   Objective: Blood pressure (!) 149/90, pulse 87, temperature 98.3 F (36.8 C), temperature source Oral, resp. rate 18, last menstrual period 12/02/2023, SpO2 100%.  Intake/Output Summary (Last 24 hours) at 01/09/2024 0907 Last data filed at 01/09/2024 9346 Gross per 24 hour  Intake 1012.21 ml  Output 200 ml  Net 812.21 ml   There were no vitals filed for this visit.  Examination: General: No acute respiratory distress Lungs: Clear to auscultation bilaterally Cardiovascular: Regular rate and rhythm without murmur  Abdomen: Soft, bowel sounds hypoactive, no rebound Extremities: No significant edema bilateral lower extremities   CBC: Recent Labs  Lab 01/06/24 2311 01/08/24 0505 01/09/24 0740  WBC 8.4 8.5 8.7  HGB 10.5* 10.0* 10.9*  HCT 32.9* 31.6* 33.6*  MCV 98.2 98.4 96.8  PLT 486* 478* 482*   Basic Metabolic Panel: Recent Labs  Lab 01/06/24 2311 01/08/24 0505 01/09/24 0740  NA 141 139 141  K 3.2* 3.6 3.4*  CL 106 104 103  CO2 26 28 26   GLUCOSE 88 88 84  BUN 8 <5* <5*  CREATININE 0.75 0.56 0.66  CALCIUM 9.1 9.1 9.3  MG  --   --  2.2  PHOS  --   --  3.3   GFR: Estimated Creatinine Clearance: 89.3 mL/min (by C-G formula based on SCr of 0.66 mg/dL).   Scheduled Meds:  amLODipine   10 mg Oral q AM   FLUoxetine   60 mg Oral q AM   pantoprazole  (PROTONIX ) IV  40 mg Intravenous QHS   Continuous Infusions:  cefTRIAXone  (ROCEPHIN )  IV Stopped (01/08/24 1348)   dextrose  5% lactated ringers  75 mL/hr at 01/09/24  9346   metronidazole  Stopped (01/08/24 2259)   promethazine  (PHENERGAN ) injection (IM or IVPB) Stopped (01/08/24 2037)     LOS: 2 days   Reyes IVAR Moores, MD [facility] Office  [phone] Pager - Text Page per Amion  If 7PM-7AM, please contact night-coverage per Amion 01/09/2024, 9:07 AM

## 2024-01-09 NOTE — Progress Notes (Signed)
 Progress Note     Subjective: Patient reports that she got upset yesterday due to a family incident that caused crying. This event was during her clamp trial, and she did experience emesis. She has not experienced emesis since then.   She reports that her pain has improved. Still having some waxing and waning cramping. Reports bowel movement yesterday that was loose. Reports flatulence. Denies nausea.  ROS  All negative with the exception of above.  Objective: Vital signs in last 24 hours: Temp:  [98.3 F (36.8 C)-98.6 F (37 C)] 98.3 F (36.8 C) (09/01 0845) Pulse Rate:  [82-92] 87 (09/01 0845) Resp:  [15-18] 18 (09/01 0845) BP: (130-149)/(82-96) 149/90 (09/01 0845) SpO2:  [96 %-100 %] 100 % (09/01 0845) Last BM Date : 01/09/24  Intake/Output from previous day: 08/31 0701 - 09/01 0700 In: 1012.2 [I.V.:673; IV Piggyback:339.2] Out: 200 [Emesis/NG output:200] Intake/Output this shift: No intake/output data recorded.  PE: General: Pleasant female who is laying in bed in NAD. HEENT: Head is normocephalic, atraumatic.  Sclera are noninjected. Conjunctiva anicteric. EOMI. NGT noted at nare Lungs: Respiratory effort nonlabored Abd: Soft, ND. Generalized tenderness to lower abdomen only with deep palpation. No rebound tenderness or guarding. Psych: A&Ox3 with an appropriate affect.     Lab Results:  Recent Labs    01/08/24 0505 01/09/24 0740  WBC 8.5 8.7  HGB 10.0* 10.9*  HCT 31.6* 33.6*  PLT 478* 482*   BMET Recent Labs    01/08/24 0505 01/09/24 0740  NA 139 141  K 3.6 3.4*  CL 104 103  CO2 28 26  GLUCOSE 88 84  BUN <5* <5*  CREATININE 0.56 0.66  CALCIUM 9.1 9.3   PT/INR No results for input(s): LABPROT, INR in the last 72 hours. CMP     Component Value Date/Time   NA 141 01/09/2024 0740   NA 150 (H) 01/01/2019 1635   K 3.4 (L) 01/09/2024 0740   CL 103 01/09/2024 0740   CO2 26 01/09/2024 0740   GLUCOSE 84 01/09/2024 0740   BUN <5 (L)  01/09/2024 0740   BUN 7 01/01/2019 1635   CREATININE 0.66 01/09/2024 0740   CREATININE 0.78 03/25/2020 1135   CREATININE 0.59 09/07/2011 1801   CALCIUM 9.3 01/09/2024 0740   PROT 6.7 01/09/2024 0740   PROT 7.6 01/01/2019 1635   ALBUMIN 3.6 01/09/2024 0740   ALBUMIN 4.5 01/01/2019 1635   AST 15 01/09/2024 0740   AST 15 03/25/2020 1135   ALT 17 01/09/2024 0740   ALT 12 03/25/2020 1135   ALKPHOS 60 01/09/2024 0740   BILITOT 0.6 01/09/2024 0740   BILITOT 0.5 03/25/2020 1135   GFRNONAA >60 01/09/2024 0740   GFRNONAA >60 03/25/2020 1135   GFRAA >60 01/16/2019 1137   Lipase     Component Value Date/Time   LIPASE 32 01/06/2024 2311       Studies/Results: DG Abd Portable 1V Result Date: 01/08/2024 CLINICAL DATA:  Nasogastric tube placement. EXAM: PORTABLE ABDOMEN - 1 VIEW COMPARISON:  Radiograph earlier today FINDINGS: Tip and side port of the enteric tube below the diaphragm in the stomach. Enteric contrast within the ascending colon. There is residual contrast within small bowel. Mild gaseous distension of transverse colon. IMPRESSION: Tip and side port of the enteric tube below the diaphragm in the stomach. Electronically Signed   By: Andrea Gasman M.D.   On: 01/08/2024 13:15   DG Abd Portable 1V-Small Bowel Obstruction Protocol-initial, 8 hr delay Result Date: 01/08/2024 EXAM: 1  VIEW XRAY OF THE ABDOMEN 01/08/2024 07:41:00 AM COMPARISON: Previous exam from today. CLINICAL HISTORY: Small bowel obstruction. FINDINGS: BOWEL: Oral contrast material progression to the nondilated right colon. SOFT TISSUES: Gastric tube remains in the decompressed stomach. Cholecystectomy clips. BONES: Lumbosacral fixation hardware. Bilateral hip degenerative joint disease. IMPRESSION: 1. No bowel obstruction. Electronically signed by: Katheleen Faes MD 01/08/2024 10:07 AM EDT RP Workstation: [id number]   DG Abd Portable 1V-Small Bowel Protocol-Position Verification Result Date: 01/07/2024 CLINICAL DATA:   Nasogastric tube placement. EXAM: PORTABLE ABDOMEN - 1 VIEW COMPARISON:  Abdominopelvic CT earlier today FINDINGS: Tip and side port of the enteric tube below the diaphragm in the stomach. Few prominent loops of air-filled small bowel in the upper abdomen. IMPRESSION: Tip and side port of the enteric tube below the diaphragm in the stomach. Electronically Signed   By: Andrea Gasman M.D.   On: 01/07/2024 19:19    Anti-infectives: Anti-infectives (From admission, onward)    Start     Dose/Rate Route Frequency Ordered Stop   01/08/24 2100  metroNIDAZOLE  (FLAGYL ) IVPB 500 mg        500 mg 100 mL/hr over 60 Minutes Intravenous Every 12 hours 01/08/24 1200     01/08/24 1300  cefTRIAXone  (ROCEPHIN ) 2 g in sodium chloride  0.9 % 100 mL IVPB        2 g 200 mL/hr over 30 Minutes Intravenous Every 24 hours 01/08/24 1200     01/07/24 0900  metroNIDAZOLE  (FLAGYL ) IVPB 500 mg  Status:  Discontinued        500 mg 100 mL/hr over 60 Minutes Intravenous Every 12 hours 01/07/24 0818 01/08/24 1200   01/07/24 0530  cefTRIAXone  (ROCEPHIN ) 2 g in sodium chloride  0.9 % 100 mL IVPB        2 g 200 mL/hr over 30 Minutes Intravenous  Once 01/07/24 0518 01/07/24 0718   01/07/24 0530  azithromycin  (ZITHROMAX ) 500 mg in sodium chloride  0.9 % 250 mL IVPB        500 mg 250 mL/hr over 60 Minutes Intravenous  Once 01/07/24 0518 01/07/24 0836        Assessment/Plan Post op state: S/p TAH/BSO/LOA   Readmit for SBO vs ileus ? Developing abscess  -Mild hypertension. Afebrile. Hemodynamically stable -WBC 8.7 from 8.5 -HGB 10.9 from 10.0 -DG Abd SBO protocol initial showed oral contrast material progression to the nondilated right colon. No bowel obstruction. -NGT with minimal output in cannister during encounter. 200 total output recorded from 8/31-9/1. Pain improved. Denies nausea. Once episode of emesis during stressful event with family. Had flatulence and BM. Continue NPO. Will discuss with MD possible repeat of  clamp trial. -Will continue to follow   Left adrenal nodule - Will need MR abd to eval adrenal nodule, but should wait until abdomen is less uncomfortable     FEN: NGT, NPO; IV fluids per primary team VTE: SCDs ID: Metronidazole  currently    LOS: 2 days   I reviewed specialist notes, nursing notes, last 24 h vitals and pain scores, last 48 h intake and output, last 24 h labs and trends, and last 24 h imaging results.   Marjorie Carlyon Favre, [facility] Surgery 01/09/2024, 9:53 AM Please see Amion for pager number during day hours 7:00am-4:30pm

## 2024-01-09 NOTE — Progress Notes (Signed)
 NG tube unclamped, back on low intermittent suction

## 2024-01-09 NOTE — Progress Notes (Addendum)
 Subjective: Patient reports dramatically improved pain; continued pain episodes but less frequent and milder.  Single episode of emesis overnight around MN.  Patient states this followed a stressful phone call with a family member that upset her.  No more N/V.  Patient had BM around MN as well.  It was watery.    Objective: I have reviewed patient's vital signs, intake and output, medications, and labs.     Latest Ref Rng & Units 01/09/2024    7:40 AM 01/08/2024    5:05 AM 01/06/2024   11:11 PM  CBC  WBC 4.0 - 10.5 K/uL 8.7  8.5  8.4   Hemoglobin 12.0 - 15.0 g/dL 89.0  89.9  89.4   Hematocrit 36.0 - 46.0 % 33.6  31.6  32.9   Platelets 150 - 400 K/uL 482  478  486       Latest Ref Rng & Units 01/09/2024    7:40 AM 01/08/2024    5:05 AM 01/06/2024   11:11 PM  CMP  Glucose 70 - 99 mg/dL 84  88  88   BUN 6 - 20 mg/dL <5  <5  8   Creatinine 0.44 - 1.00 mg/dL 9.33  9.43  9.24   Sodium 135 - 145 mmol/L 141  139  141   Potassium 3.5 - 5.1 mmol/L 3.4  3.6  3.2   Chloride 98 - 111 mmol/L 103  104  106   CO2 22 - 32 mmol/L 26  28  26    Calcium 8.9 - 10.3 mg/dL 9.3  9.1  9.1   Total Protein 6.5 - 8.1 g/dL 6.7  6.0  5.6   Total Bilirubin 0.0 - 1.2 mg/dL 0.6  0.2  0.4   Alkaline Phos 38 - 126 U/L 60  57  61   AST 15 - 41 U/L 15  14  19    ALT 0 - 44 U/L 17  15  21       General: alert, cooperative, and appears stated age Extremities: extremities normal, atraumatic, no cyanosis or edema Abd: soft, tender periumbilically and to right of umbilicus.  Pfannestiel incision stable; no erythema or draining.  Superficial dehiscence on right side of incision   Assessment/Plan: 43yo POD13 s/p TAH, BS, LOA with early pelvic abscess and possible SBO -Appreciate Gen Surg involvement and recs -NGT to LIS and now intermittent clamping, NPO and IVF -Abx D3: continue ceftriaxone  and flagyl  -IV Dilaudid  prn pain -IV Phenergan  prn nausea/vomiting -Continue daily CBC, CMP -Incidental finding of left adrenal  gland 2.8 cm nodule on CT.  Plan f/u adrenal protocol CT or MRI (with and without contrast) once pain improved.  Will use imaging to reassess possible early abscess formation.   LOS: 2 days    Ann Blumenthal, Ann [name] 01/09/2024, 9:16 AM

## 2024-01-09 NOTE — Progress Notes (Addendum)
 MD order to clamp NG tube for 6 hours for trial. NG tube has been clamped. HOB at 30 degrees, patient made aware to keep HOB elevated, verbalized understanding.

## 2024-01-09 NOTE — Progress Notes (Signed)
 NG tube unclamped and connected back to low intermittent wall suction; per NG tube clamp trial orders. NG tube remained clamped for 6 hours. Tolerated well, no complaints of increased pain and no n/v during trial.

## 2024-01-10 LAB — BASIC METABOLIC PANEL WITH GFR
Anion gap: 13 (ref 5–15)
BUN: <5 mg/dL — ABNORMAL LOW (ref 6–20)
CO2: 24 mmol/L (ref 22–32)
Calcium: 9 mg/dL (ref 8.9–10.3)
Chloride: 105 mmol/L (ref 98–111)
Creatinine, Ser: 0.82 mg/dL (ref 0.44–1.00)
GFR, Estimated: >60 mL/min (ref >60)
Glucose, Bld: 91 mg/dL (ref 70–99)
Potassium: 3.9 mmol/L (ref 3.5–5.1)
Sodium: 142 mmol/L (ref 135–145)

## 2024-01-10 NOTE — Progress Notes (Signed)
 Patient is feeling better.  She is not reporting any nausea this am. She is feeling hungry. Pain is decreased.  BP 110/77 (BP Location: Left Arm)   Pulse 91   Temp 98.2 F (36.8 C) (Oral)   Resp 18   LMP 12/02/2023 (Approximate)   SpO2 98%  Results for orders placed or performed during the hospital encounter of 01/06/24 (from the past 24 hours)  Basic metabolic panel with GFR     Status: Abnormal   Collection Time: 01/10/24  6:28 AM  Result Value Ref Range   Sodium 142 135 - 145 mmol/L   Potassium 3.9 3.5 - 5.1 mmol/L   Chloride 105 98 - 111 mmol/L   CO2 24 22 - 32 mmol/L   Glucose, Bld 91 70 - 99 mg/dL   BUN <5 (L) 6 - 20 mg/dL   Creatinine, Ser 9.17 0.44 - 1.00 mg/dL   Calcium 9.0 8.9 - 89.6 mg/dL   GFR, Estimated >39 >39 mL/min   Anion gap 13 5 - 15   Prior to Admission medications   Medication Sig Start Date End Date Taking? Authorizing Provider  albuterol  (VENTOLIN  HFA) 108 (90 Base) MCG/ACT inhaler Inhale 1-2 puffs into the lungs every 6 (six) hours as needed for wheezing or shortness of breath. 06/12/23  Yes Raspet, Erin K, PA-C  ALPRAZolam  (XANAX ) 0.5 MG tablet Take 0.5 mg by mouth 2 (two) times daily as needed for anxiety. 09/27/22  Yes [provider]  amLODipine  (NORVASC ) 5 MG tablet Take 5 mg by mouth in the morning. 05/06/23  Yes [provider]  amphetamine -dextroamphetamine  (ADDERALL) 10 MG tablet Take 1 tablet (10 mg total) by mouth daily with breakfast. Take one pill  po  once or twice in the afternoon Patient taking differently: Take 10 mg by mouth See admin instructions. Take 1 tablet (10 mg) by mouth scheduled in the morning, and may take 1-2 tablets (10 mg -20 mg) by mouth in the afternoon--if needed for attention/focus 12/01/23  Yes Sater, Charlie LABOR, MD  FLUoxetine  (PROZAC ) 20 MG capsule Take 60 mg by mouth in the morning. 12/14/19  Yes [provider]  HYDROmorphone  (DILAUDID ) 2 MG tablet Take 1 tablet (2 mg total) by mouth every 3 (three)  hours as needed for severe pain (pain score 7-10) ((when tolerating fluids)). 01/01/24  Yes Mat Browning, MD  lisdexamfetamine (VYVANSE ) 60 MG capsule Take 1 capsule (60 mg total) by mouth every morning. 12/19/23  Yes Sater, Charlie LABOR, MD  pantoprazole  (PROTONIX ) 40 MG tablet Take 1 tablet (40 mg total) by mouth 2 (two) times daily. 01/01/24  Yes Mat Browning, MD  senna-docusate (SENOKOT-S) 8.6-50 MG tablet Take 1 tablet by mouth 2 (two) times daily as needed (constipation.). 11/07/20  Yes [provider]  tiZANidine  (ZANAFLEX ) 4 MG tablet TAKE 1 TABLET(4 MG) BY MOUTH EVERY 8 HOURS AS NEEDED FOR MUSCLE SPASMS 11/14/23  Yes Sater, Charlie LABOR, MD  Ublituximab -xiiy (BRIUMVI  IV) Inject into the vein every 6 (six) months.   Yes [provider]   Scheduled Meds:  amLODipine   10 mg Oral Daily   FLUoxetine   60 mg Oral q AM   pantoprazole  (PROTONIX ) IV  40 mg Intravenous QHS   Continuous Infusions:  cefTRIAXone  (ROCEPHIN )  IV 2 g (01/09/24 1320)   dextrose  5 % and 0.9 % NaCl with KCl 20 mEq/L Stopped (01/10/24 0853)   metronidazole  500 mg (01/10/24 0818)   promethazine  (PHENERGAN ) injection (IM or IVPB) Stopped (01/08/24 2037)   PRN  Meds:.albuterol , ALPRAZolam , diphenhydrAMINE , HYDROmorphone  (DILAUDID ) injection, HYDROmorphone , promethazine  **OR** promethazine  (PHENERGAN ) injection (IM or IVPB) **OR** promethazine , senna-docusate, tiZANidine  Results for orders placed or performed during the hospital encounter of 01/06/24 (from the past 24 hours)  Basic metabolic panel with GFR     Status: Abnormal   Collection Time: 01/10/24  6:28 AM  Result Value Ref Range   Sodium 142 135 - 145 mmol/L   Potassium 3.9 3.5 - 5.1 mmol/L   Chloride 105 98 - 111 mmol/L   CO2 24 22 - 32 mmol/L   Glucose, Bld 91 70 - 99 mg/dL   BUN <5 (L) 6 - 20 mg/dL   Creatinine, Ser 9.17 0.44 - 1.00 mg/dL   Calcium 9.0 8.9 - 89.6 mg/dL   GFR, Estimated >39 >39 mL/min   Anion gap 13 5 - 15   BP 110/77 (BP  Location: Left Arm)   Pulse 91   Temp 98.2 F (36.8 C) (Oral)   Resp 18   LMP 12/02/2023 (Approximate)   SpO2 98%  General alert and oriented Abdomen is soft and slightly  tender  Non distended  Incision is inspected - cleaned with peroxide  Both corners are slightly separated  Steri strips are placed across  IMPRESSION: Post op TAH Von Willebrand's  Readmission with ileus  Possible developing abscess  PLAN: Patient clinically improved  Appreciate General surgery co management - their team may be removing NG Tube today and slow refeed Continue IV Antibiotics

## 2024-01-10 NOTE — Progress Notes (Signed)
 Progress Note     Subjective: Continues to feels better.  Had a BM and flatus on Sunday.  NGT with minimal output.  Objective: Vital signs in last 24 hours: Temp:  [97.6 F (36.4 C)-98.4 F (36.9 C)] 98 F (36.7 C) (09/02 0916) Pulse Rate:  [84-91] 84 (09/02 0916) Resp:  [17-18] 18 (09/02 0916) BP: (110-142)/(77-98) 135/83 (09/02 0916) SpO2:  [97 %-99 %] 97 % (09/02 0916) Last BM Date : 01/09/24  Intake/Output from previous day: 09/01 0701 - 09/02 0700 In: 905.8 [I.V.:605.8; IV Piggyback:300] Out: -  Intake/Output this shift: Total I/O In: 352.3 [I.V.:352.3] Out: -   PE: General: Pleasant female who is laying in bed in NAD. Abd: Soft, ND. Generalized tenderness to lower abdomen only with deep palpation. No rebound tenderness or guarding.  NGT with minimal output, 200cc yesterday   Lab Results:  Recent Labs    01/08/24 0505 01/09/24 0740  WBC 8.5 8.7  HGB 10.0* 10.9*  HCT 31.6* 33.6*  PLT 478* 482*   BMET Recent Labs    01/09/24 0740 01/10/24 0628  NA 141 142  K 3.4* 3.9  CL 103 105  CO2 26 24  GLUCOSE 84 91  BUN <5* <5*  CREATININE 0.66 0.82  CALCIUM 9.3 9.0   PT/INR No results for input(s): LABPROT, INR in the last 72 hours. CMP     Component Value Date/Time   NA 142 01/10/2024 0628   NA 150 (H) 01/01/2019 1635   K 3.9 01/10/2024 0628   CL 105 01/10/2024 0628   CO2 24 01/10/2024 0628   GLUCOSE 91 01/10/2024 0628   BUN <5 (L) 01/10/2024 0628   BUN 7 01/01/2019 1635   CREATININE 0.82 01/10/2024 0628   CREATININE 0.78 03/25/2020 1135   CREATININE 0.59 09/07/2011 1801   CALCIUM 9.0 01/10/2024 0628   PROT 6.7 01/09/2024 0740   PROT 7.6 01/01/2019 1635   ALBUMIN 3.6 01/09/2024 0740   ALBUMIN 4.5 01/01/2019 1635   AST 15 01/09/2024 0740   AST 15 03/25/2020 1135   ALT 17 01/09/2024 0740   ALT 12 03/25/2020 1135   ALKPHOS 60 01/09/2024 0740   BILITOT 0.6 01/09/2024 0740   BILITOT 0.5 03/25/2020 1135   GFRNONAA >60 01/10/2024 0628    GFRNONAA >60 03/25/2020 1135   GFRAA >60 01/16/2019 1137   Lipase     Component Value Date/Time   LIPASE 32 01/06/2024 2311       Studies/Results: DG Abd Portable 1V Result Date: 01/08/2024 CLINICAL DATA:  Nasogastric tube placement. EXAM: PORTABLE ABDOMEN - 1 VIEW COMPARISON:  Radiograph earlier today FINDINGS: Tip and side port of the enteric tube below the diaphragm in the stomach. Enteric contrast within the ascending colon. There is residual contrast within small bowel. Mild gaseous distension of transverse colon. IMPRESSION: Tip and side port of the enteric tube below the diaphragm in the stomach. Electronically Signed   By: Melanie  Sanford M.D.   On: 01/08/2024 13:15    Anti-infectives: Anti-infectives (From admission, onward)    Start     Dose/Rate Route Frequency Ordered Stop   01/08/24 2100  metroNIDAZOLE (FLAGYL) IVPB 500 mg        500 mg 100 mL/hr over 60 Minutes Intravenous Every 12 hours 01/08/24 1200     08 /31/25 1300  cefTRIAXone  (ROCEPHIN ) 2 g in sodium chloride  0.9 % 100 mL IVPB        2 g 200 mL/hr over 30 Minutes Intravenous Every 24 hours 01/08/24 1200  01/07/24 0900  metroNIDAZOLE  (FLAGYL ) IVPB 500 mg  Status:  Discontinued        500 mg 100 mL/hr over 60 Minutes Intravenous Every 12 hours 01/07/24 0818 01/08/24 1200   01/07/24 0530  cefTRIAXone  (ROCEPHIN ) 2 g in sodium chloride  0.9 % 100 mL IVPB        2 g 200 mL/hr over 30 Minutes Intravenous  Once 01/07/24 0518 01/07/24 0718   01/07/24 0530  azithromycin  (ZITHROMAX ) 500 mg in sodium chloride  0.9 % 250 mL IVPB        500 mg 250 mL/hr over 60 Minutes Intravenous  Once 01/07/24 0518 01/07/24 0836        Assessment/Plan Post op state: S/p TAH/BSO/LOA   Readmit for SBO vs ileus ? Developing abscess  -AF, VSS -minimal NGT output.  Had a BM and flatus on Sunday. -will DC NGT and allow CLD and see how she does. -Will continue to follow   Left adrenal nodule - Will need MR abd to eval adrenal  nodule, but should wait until abdomen is less uncomfortable     FEN: CLD; IV fluids per primary team VTE: SCDs ID: Metronidazole  currently    LOS: 3 days   I reviewed specialist notes, nursing notes, last 24 h vitals and pain scores, last 48 h intake and output, last 24 h labs and trends, and last 24 h imaging results.   Burnard FORBES Banter, [facility] Surgery 01/10/2024, 10:10 AM Please see Amion for pager number during day hours 7:00am-4:30pm

## 2024-01-10 NOTE — Plan of Care (Signed)
  Problem: Clinical Measurements: Goal: Ability to maintain clinical measurements within normal limits will improve Outcome: Progressing Goal: Diagnostic test results will improve Outcome: Progressing   Problem: Activity: Goal: Risk for activity intolerance will decrease Outcome: Progressing   Problem: Nutrition: Goal: Adequate nutrition will be maintained Outcome: Progressing   Problem: Pain Managment: Goal: General experience of comfort will improve and/or be controlled Outcome: Progressing   Problem: Safety: Goal: Ability to remain free from injury will improve Outcome: Progressing

## 2024-01-10 NOTE — Consult Note (Signed)
 CONSULT FOLLOW UP NOTE  Ann [name]  FMW:[id number] DOB: [birth year] DOA: 01/06/2024 PCP: Jolee Madelin Patch, MD    Brief Narrative:  43 year old with a history of von Willebrand's disease, anxiety/depression, morbid obesity status post gastric sleeve, and dysmenorrhea and menorrhagia with fibroids who underwent bilateral salpingectomy and total abdominal hysterectomy with extensive LOA 8/19 per Dr. Mat, with her postoperative course complicated by ileus as well as anemia requiring blood transfusion.  She was discharged from the hospital 01/01/2024, and returned to the ER 8/30 with complaints of persistent severe abdominal pain.  This was associated with nausea and vomiting anytime she attempted to eat.  CT abd/pelvis in the ER were suggestive of early SBO with a discrete transition point adjacent to the postoperative abdominal wall.  Incidentally noted was a 2.8 cm R adrenal nodule new since previous imaging 2009.  Goals of Care:   Code Status: Full Code   DVT prophylaxis: SCDs Start: 01/07/24 0917   Interim Hx: No acute events reported overnight.  Has tolerated clamping of NG without difficulty.  Afebrile.  Vital signs stable. NG removed by Gen Surg today, with initiation of clear liquid diet. Medical issues remain stable.   Assessment & Plan:  Small bowel obstruction v/s ileus with possible developing abscess - s/p total abdominal hysterectomy with bilateral salpingectomy and extensive LOA 12/27/2023 Care per General Surgery - on empiric antibiotic  Bibasilar atelectasis Noted incidentally on CT abd/pelvis - no indication of acute infectious lung disease - encouraged use of IS   Postoperative anemia - von Willebrand's disease Hemoglobin stable/improving at this time  Incidentally noted 2.8 cm LEFT adrenal nodule New since prior imaging 2009 - will need MRI abdomen (with and without contrast) for more complete evaluation once above issues have settled (this could be  arranged as outpatient)    Disposition:  per primary team (OB/GYN)   Objective: Blood pressure 110/77, pulse 91, temperature 98.2 F (36.8 C), temperature source Oral, resp. rate 18, last menstrual period 12/02/2023, SpO2 98%.  Intake/Output Summary (Last 24 hours) at 01/10/2024 0849 Last data filed at 01/10/2024 0300 Gross per 24 hour  Intake 905.77 ml  Output --  Net 905.77 ml   There were no vitals filed for this visit.  Examination: No medical exam indicated today. [facility] will continue to follow along with you.    CBC: Recent Labs  Lab 01/06/24 2311 01/08/24 0505 01/09/24 0740  WBC 8.4 8.5 8.7  HGB 10.5* 10.0* 10.9*  HCT 32.9* 31.6* 33.6*  MCV 98.2 98.4 96.8  PLT 486* 478* 482*   Basic Metabolic Panel: Recent Labs  Lab 01/08/24 0505 01/09/24 0740 01/10/24 0628  NA 139 141 142  K 3.6 3.4* 3.9  CL 104 103 105  CO2 28 26 24   GLUCOSE 88 84 91  BUN <5* <5* <5*  CREATININE 0.56 0.66 0.82  CALCIUM 9.1 9.3 9.0  MG  --  2.2  --   PHOS  --  3.3  --    GFR: Estimated Creatinine Clearance: 87.1 mL/min (by C-G formula based on SCr of 0.82 mg/dL).   Scheduled Meds:  amLODipine   10 mg Oral Daily   FLUoxetine   60 mg Oral q AM   pantoprazole  (PROTONIX ) IV  40 mg Intravenous QHS   Continuous Infusions:  cefTRIAXone  (ROCEPHIN )  IV 2 g (01/09/24 1320)   dextrose  5 % and 0.9 % NaCl with KCl 20 mEq/L 60 mL/hr at 01/09/24 1654   metronidazole  500 mg (01/10/24 0818)   promethazine  (  PHENERGAN ) injection (IM or IVPB) Stopped (01/08/24 2037)     LOS: 3 days   Reyes IVAR Moores, MD [facility] Office  [phone] Pager - Text Page per Amion  If 7PM-7AM, please contact night-coverage per Amion 01/10/2024, 8:49 AM

## 2024-01-10 NOTE — Progress Notes (Signed)
   01/10/24 1533  TOC Brief Assessment  Insurance and Status Reviewed  Patient has primary care physician Yes  Home environment has been reviewed family  Prior level of function: independent  Prior/Current Home Services No current home services  Social Drivers of Health Review SDOH reviewed no interventions necessary  Readmission risk has been reviewed No  Transition of care needs no transition of care needs at this time    ileus, possible  abscess , IV ABX , DC NGT start CL    Transition of Care Department (TOC) has reviewed patient and no TOC needs have been identified at this time. We will continue to monitor patient advancement through interdisciplinary progression rounds. If new patient transition needs arise, please place a TOC consult.

## 2024-01-10 NOTE — Plan of Care (Signed)
  Problem: Pain Managment: Goal: General experience of comfort will improve and/or be controlled Outcome: Progressing   Problem: Safety: Goal: Ability to remain free from injury will improve Outcome: Progressing   Problem: Skin Integrity: Goal: Risk for impaired skin integrity will decrease Outcome: Progressing

## 2024-01-11 ENCOUNTER — Inpatient Hospital Stay (HOSPITAL_COMMUNITY)

## 2024-01-11 DIAGNOSIS — T8149XA Infection following a procedure, other surgical site, initial encounter: Secondary | ICD-10-CM | POA: Diagnosis not present

## 2024-01-11 MED ORDER — POLYETHYLENE GLYCOL 3350 17 G PO PACK
17.0000 g | PACK | Freq: Every day | ORAL | Status: DC
  Filled 2024-01-11: qty 1

## 2024-01-11 MED ORDER — OXYCODONE HCL 5 MG PO TABS
5.0000 mg | ORAL_TABLET | ORAL | Status: DC | PRN
  Filled 2024-01-11: qty 1

## 2024-01-11 MED ORDER — SIMETHICONE 80 MG PO CHEW
80.0000 mg | CHEWABLE_TABLET | Freq: Four times a day (QID) | ORAL | Status: DC | PRN
  Administered 2024-01-13: 80 mg via ORAL
  Filled 2024-01-11: qty 1

## 2024-01-11 NOTE — Progress Notes (Signed)
 Patient has tolerated clear liquids/jello without nausea. Had 3 watery BMs yesterday. Some gas pain.  BP 125/82 (BP Location: Left Arm)   Pulse 83   Temp 98 F (36.7 C) (Oral)   Resp 16   LMP 12/02/2023 (Approximate)   SpO2 99%  No results found for this or any previous visit (from the past 24 hours). Abdomen is flat non distended  Incision  Steri strips were loose - removed  Left corner closing more  Right corner still slightly separated  Cleaned and steri strips placed on both corners   IMPRESSION: Post TAH Post op ileus ?developing abscess Von Willebrand's disease   PLAN: Patient is clinically improving  Appreciate General surgery co management Continue IV antibiotics Advance diet per G Surgery

## 2024-01-11 NOTE — Progress Notes (Signed)
 Progress Note     Subjective: Overall feeling better. Denies any flatus in the last 3 days. Reports a couple of stools yesterday. Tolerating CLD. Denies belching, nausea, or vomiting. Reports increased bloating of her abdomen. Does say that she feels hungry. Has been walking in the halls.  Objective: Vital signs in last 24 hours: Temp:  [98 F (36.7 C)-99 F (37.2 C)] 98 F (36.7 C) (09/03 0817) Pulse Rate:  [83-93] 83 (09/03 0817) Resp:  [16-18] 16 (09/03 0817) BP: (113-125)/(69-82) 125/82 (09/03 0817) SpO2:  [97 %-99 %] 99 % (09/03 0817) Last BM Date : (P) 01/11/24  Intake/Output from previous day: 09/02 0701 - 09/03 0700 In: 708.3 [P.O.:356; I.V.:352.3] Out: -  Intake/Output this shift: No intake/output data recorded.  PE: General: Pleasant female who is laying in bed in NAD. Abd: Soft, mild distention. Minimal tenderness around incision, No rebound tenderness or guarding.    Lab Results:  Recent Labs    01/09/24 0740  WBC 8.7  HGB 10.9*  HCT 33.6*  PLT 482*   BMET Recent Labs    01/09/24 0740 01/10/24 0628  NA 141 142  K 3.4* 3.9  CL 103 105  CO2 26 24  GLUCOSE 84 91  BUN <5* <5*  CREATININE 0.66 0.82  CALCIUM 9.3 9.0   PT/INR No results for input(s): LABPROT, INR in the last 72 hours. CMP     Component Value Date/Time   NA 142 01/10/2024 0628   NA 150 (H) 01/01/2019 1635   K 3.9 01/10/2024 0628   CL 105 01/10/2024 0628   CO2 24 01/10/2024 0628   GLUCOSE 91 01/10/2024 0628   BUN <5 (L) 01/10/2024 0628   BUN 7 01/01/2019 1635   CREATININE 0.82 01/10/2024 0628   CREATININE 0.78 03/25/2020 1135   CREATININE 0.59 09/07/2011 1801   CALCIUM 9.0 01/10/2024 0628   PROT 6.7 01/09/2024 0740   PROT 7.6 01/01/2019 1635   ALBUMIN 3.6 01/09/2024 0740   ALBUMIN 4.5 01/01/2019 1635   AST 15 01/09/2024 0740   AST 15 03/25/2020 1135   ALT 17 01/09/2024 0740   ALT 12 03/25/2020 1135   ALKPHOS 60 01/09/2024 0740   BILITOT 0.6 01/09/2024 0740    BILITOT 0.5 03/25/2020 1135   GFRNONAA >60 01/10/2024 0628   GFRNONAA >60 03/25/2020 1135   GFRAA >60 01/16/2019 1137   Lipase     Component Value Date/Time   LIPASE 32 01/06/2024 2311       Studies/Results: No results found.   Anti-infectives: Anti-infectives (From admission, onward)    Start     Dose/Rate Route Frequency Ordered Stop   01/08/24 2100  metroNIDAZOLE  (FLAGYL ) IVPB 500 mg        500 mg 100 mL/hr over 60 Minutes Intravenous Every 12 hours 01/08/24 1200     01/08/24 1300  cefTRIAXone  (ROCEPHIN ) 2 g in sodium chloride  0.9 % 100 mL IVPB        2 g 200 mL/hr over 30 Minutes Intravenous Every 24 hours 01/08/24 1200     01/07/24 0900  metroNIDAZOLE  (FLAGYL ) IVPB 500 mg  Status:  Discontinued        500 mg 100 mL/hr over 60 Minutes Intravenous Every 12 hours 01/07/24 0818 01/08/24 1200   01/07/24 0530  cefTRIAXone  (ROCEPHIN ) 2 g in sodium chloride  0.9 % 100 mL IVPB        2 g 200 mL/hr over 30 Minutes Intravenous  Once 01/07/24 0518 01/07/24 0718   01/07/24 0530  azithromycin  (ZITHROMAX ) 500 mg in sodium chloride  0.9 % 250 mL IVPB        500 mg 250 mL/hr over 60 Minutes Intravenous  Once 01/07/24 0518 01/07/24 0836        Assessment/Plan Post op state: S/p TAH/BSO/LOA   Readmit for SBO vs ileus ? Developing abscess - pelvic free fluid; WBC normalized 9/1, afebrile. TMAX 99 -AF, VSS - NGT removed 9/2, having BMs but minimal flatus, endorses bloating - advance to FLD, check KUB prior to further diet advancement.  - failure to improve, or evidence of infection like fever, chills, or leukocytosis would consider repeat CT scan to follow up on pelvic fluid.    Left adrenal nodule - Will need MR abd to eval adrenal nodule, but should wait until abdomen is less uncomfortable     FEN: CLD; IV fluids per primary team VTE: SCDs ID: Metronidazole  currently    LOS: 4 days   I reviewed specialist notes, nursing notes, last 24 h vitals and pain scores, last 48 h  intake and output, last 24 h labs and trends, and last 24 h imaging results.   Ann [name], [facility] Surgery 01/11/2024, 11:29 AM Please see Amion for pager number during day hours 7:00am-4:30pm

## 2024-01-11 NOTE — Hospital Course (Signed)
 43 year old with a history of von Willebrand's disease, anxiety/depression, morbid obesity status post gastric sleeve, and dysmenorrhea and menorrhagia with fibroids who underwent bilateral salpingectomy and total abdominal hysterectomy with extensive LOA 8/19 per Dr. Mat, with her postoperative course complicated by ileus as well as anemia requiring blood transfusion. She was discharged from the hospital 01/01/2024, and returned to the ER 8/30 with complaints of persistent severe abdominal pain. This was associated with nausea and vomiting anytime she attempted to eat. CT abd/pelvis in the ER were suggestive of early SBO with a discrete transition point adjacent to the postoperative abdominal wall. Incidentally noted was a 2.8 cm R adrenal nodule new since previous imaging 2009.   [facility] has been consulted for medical management.  Assessment and Plan: Small bowel obstruction versus ileus Possible developing abscess. Patient underwent total abdominal hysterectomy with bilateral salpingectomy and extensive lysis of adhesions on 8/19 by Dr. Mat. She presents with complaints of lower abdominal pain. CT concerning for small bowel obstruction with possible developing abscess. Started on ceftriaxone  and Flagyl .  On 8/29. General Surgery consulted by primary team. Management per surgery.   Possible pneumonia versus atelectasis-both ruled out. CT scan noted bilateral lung atelectasis. Currently does not have any symptoms of pneumonia or respiratory illness. Patient will take deep breath. On exam no crackles heard. Monitor clinically.  Hypokalemia Corrected.  Maintain K more than 4.   Postoperative anemia History of von Willebrand's disease. Baseline hemoglobin around 12. Postop dropped around 7.1. For last 3 checks stable around 10. SP 2 PRBC transfusion on 8/21.  Right adrenal nodule 2.8 cm right adrenal nodule noted on CT. Will need MRI of the abdomen to further  eval, recommend outpatient study once she recovers from current illness.. Currently asymptomatic.

## 2024-01-11 NOTE — Progress Notes (Signed)
 [facility] Consultation Progress Note  Patient: Ann [name] FMW:[id number]   PCP: Jolee Madelin Patch, MD DOB: 07/01/1980   DOA: 01/06/2024   DOS: 01/11/2024   Date of Service: the patient was seen and examined on 01/11/2024 Primary service: Dannielle Bouchard, DO   Brief Hospital Course: 43 year old with a history of von Willebrand's disease, anxiety/depression, morbid obesity status post gastric sleeve, and dysmenorrhea and menorrhagia with fibroids who underwent bilateral salpingectomy and total abdominal hysterectomy with extensive LOA 8/19 per Dr. Mat, with her postoperative course complicated by ileus as well as anemia requiring blood transfusion. She was discharged from the hospital 01/01/2024, and returned to the ER 8/30 with complaints of persistent severe abdominal pain. This was associated with nausea and vomiting anytime she attempted to eat. CT abd/pelvis in the ER were suggestive of early SBO with a discrete transition point adjacent to the postoperative abdominal wall. Incidentally noted was a 2.8 cm R adrenal nodule new since previous imaging 2009.   [facility] has been consulted for medical management.  Assessment and Plan: Small bowel obstruction versus ileus Possible developing abscess. Patient underwent total abdominal hysterectomy with bilateral salpingectomy and extensive lysis of adhesions on 8/19 by Dr. Mat. She presents with complaints of lower abdominal pain. CT concerning for small bowel obstruction with possible developing abscess. Started on ceftriaxone  and Flagyl .  On 8/29. General Surgery consulted by primary team. Management per surgery.   Possible pneumonia versus atelectasis-both ruled out. CT scan noted bilateral lung atelectasis. Currently does not have any symptoms of pneumonia or respiratory illness. Patient will take deep breath. On exam no crackles heard. Monitor clinically.  Hypokalemia Corrected.  Maintain K more than  4.   Postoperative anemia History of von Willebrand's disease. Baseline hemoglobin around 12. Postop dropped around 7.1. For last 3 checks stable around 10. SP 2 PRBC transfusion on 8/21.  Right adrenal nodule 2.8 cm right adrenal nodule noted on CT. Will need MRI of the abdomen to further eval, recommend outpatient study once she recovers from current illness.. Currently asymptomatic.   We will continue to follow the patient.   Subjective: Continues to have some abdominal discomfort.  No nausea no vomiting.  Passing gas.  No BM today.  Objective: Vitals:   01/10/24 1611 01/10/24 2226 01/11/24 0503 01/11/24 0817  BP: 115/76 113/80 123/69 125/82  Pulse: 93 86 83 83  Resp: 18 18 18 16   Temp: 98.2 F (36.8 C) 99 F (37.2 C) 98.4 F (36.9 C) 98 F (36.7 C)  TempSrc: Oral Oral Oral Oral  SpO2: 99% 98% 97% 99%    Bowels are not present. S1-S2 present. Abdomen diffusely tender. Clear to auscultation.  No crackles or rhonchi heard. No edema. Family Communication: No one at bedside  Data Reviewed: Since last encounter, pertinent lab results reviewed CBC and BMP CT abdomen as well as x-ray abdomen   .   Author: Yetta Blanch, MD  [facility] 01/11/2024  1:12 PM  To reach On-call, Look up on care teams to locate the [facility] team or provider name and reach out to them via secure chat or [email or web address] Between 7PM-7AM, please contact night-coverage. If you still have difficulty reaching the attending provider, please page the [facility] (Director on Call) for [facility] on amion for assistance.

## 2024-01-11 NOTE — Plan of Care (Signed)
   Problem: Education: Goal: Knowledge of General Education information will improve Description: Including pain rating scale, medication(s)/side effects and non-pharmacologic comfort measures Outcome: Progressing   Problem: Clinical Measurements: Goal: Ability to maintain clinical measurements within normal limits will improve Outcome: Progressing Goal: Diagnostic test results will improve Outcome: Progressing Goal: Respiratory complications will improve Outcome: Progressing   Problem: Activity: Goal: Risk for activity intolerance will decrease Outcome: Progressing   Problem: Nutrition: Goal: Adequate nutrition will be maintained Outcome: Progressing

## 2024-01-12 DIAGNOSIS — T8149XA Infection following a procedure, other surgical site, initial encounter: Secondary | ICD-10-CM | POA: Diagnosis not present

## 2024-01-12 LAB — CBC
HCT: 34.6 % — ABNORMAL LOW (ref 36.0–46.0)
Hemoglobin: 11.1 g/dL — ABNORMAL LOW (ref 12.0–15.0)
MCH: 31.5 pg (ref 26.0–34.0)
MCHC: 32.1 g/dL (ref 30.0–36.0)
MCV: 98.3 fL (ref 80.0–100.0)
Platelets: 476 K/uL — ABNORMAL HIGH (ref 150–400)
RBC: 3.52 MIL/uL — ABNORMAL LOW (ref 3.87–5.11)
RDW: 15 % (ref 11.5–15.5)
WBC: 7.6 K/uL (ref 4.0–10.5)
nRBC: 0 % (ref 0.0–0.2)

## 2024-01-12 LAB — BASIC METABOLIC PANEL WITH GFR
Anion gap: 9 (ref 5–15)
BUN: <5 mg/dL — ABNORMAL LOW (ref 6–20)
CO2: 25 mmol/L (ref 22–32)
Calcium: 8.9 mg/dL (ref 8.9–10.3)
Chloride: 103 mmol/L (ref 98–111)
Creatinine, Ser: 0.87 mg/dL (ref 0.44–1.00)
GFR, Estimated: >60 mL/min (ref >60)
Glucose, Bld: 91 mg/dL (ref 70–99)
Potassium: 3.9 mmol/L (ref 3.5–5.1)
Sodium: 137 mmol/L (ref 135–145)

## 2024-01-12 MED ORDER — PANTOPRAZOLE SODIUM 40 MG PO TBEC
40.0000 mg | DELAYED_RELEASE_TABLET | Freq: Every day | ORAL | Status: DC
  Administered 2024-01-12: 40 mg via ORAL
  Filled 2024-01-12: qty 1

## 2024-01-12 NOTE — Plan of Care (Signed)
 Patient has been given PRN pain and itching meds, had been educated about her wounds and importance of walking. Problem: Education: Goal: Knowledge of General Education information will improve Description: Including pain rating scale, medication(s)/side effects and non-pharmacologic comfort measures Outcome: Progressing   Problem: Health Behavior/Discharge Planning: Goal: Ability to manage health-related needs will improve Outcome: Progressing   Problem: Clinical Measurements: Goal: Ability to maintain clinical measurements within normal limits will improve Outcome: Progressing Goal: Will remain free from infection Outcome: Progressing Goal: Diagnostic test results will improve Outcome: Progressing Goal: Respiratory complications will improve Outcome: Progressing Goal: Cardiovascular complication will be avoided Outcome: Progressing   Problem: Activity: Goal: Risk for activity intolerance will decrease Outcome: Progressing   Problem: Nutrition: Goal: Adequate nutrition will be maintained Outcome: Progressing   Problem: Coping: Goal: Level of anxiety will decrease Outcome: Progressing   Problem: Elimination: Goal: Will not experience complications related to bowel motility Outcome: Progressing Goal: Will not experience complications related to urinary retention Outcome: Progressing   Problem: Pain Managment: Goal: General experience of comfort will improve and/or be controlled Outcome: Progressing   Problem: Safety: Goal: Ability to remain free from injury will improve Outcome: Progressing   Problem: Skin Integrity: Goal: Risk for impaired skin integrity will decrease Outcome: Progressing   Problem: Education: Goal: Knowledge of the prescribed therapeutic regimen will improve Outcome: Progressing Goal: Understanding of sexual limitations or changes related to disease process or condition will improve Outcome: Progressing Goal: Individualized Educational  Video(s) Outcome: Progressing   Problem: Self-Concept: Goal: Communication of feelings regarding changes in body function or appearance will improve Outcome: Progressing   Problem: Skin Integrity: Goal: Demonstration of wound healing without infection will improve Outcome: Progressing

## 2024-01-12 NOTE — Progress Notes (Signed)
 S:  Patient reports eating some pudding last night and then had large amount of flatus. She is feeling better. Hungry.  O: BP 111/68 (BP Location: Left Arm)   Pulse 80   Temp 98 F (36.7 C) (Oral)   Resp 16   LMP 12/02/2023 (Approximate)   SpO2 98%  Results for orders placed or performed during the hospital encounter of 01/06/24 (from the past 24 hours)  CBC     Status: Abnormal   Collection Time: 01/12/24  3:01 AM  Result Value Ref Range   WBC 7.6 4.0 - 10.5 K/uL   RBC 3.52 (L) 3.87 - 5.11 MIL/uL   Hemoglobin 11.1 (L) 12.0 - 15.0 g/dL   HCT 65.3 (L) 63.9 - 53.9 %   MCV 98.3 80.0 - 100.0 fL   MCH 31.5 26.0 - 34.0 pg   MCHC 32.1 30.0 - 36.0 g/dL   RDW 84.9 88.4 - 84.4 %   Platelets 476 (H) 150 - 400 K/uL   nRBC 0.0 0.0 - 0.2 %  Basic metabolic panel     Status: Abnormal   Collection Time: 01/12/24  3:01 AM  Result Value Ref Range   Sodium 137 135 - 145 mmol/L   Potassium 3.9 3.5 - 5.1 mmol/L   Chloride 103 98 - 111 mmol/L   CO2 25 22 - 32 mmol/L   Glucose, Bld 91 70 - 99 mg/dL   BUN <5 (L) 6 - 20 mg/dL   Creatinine, Ser 9.12 0.44 - 1.00 mg/dL   Calcium 8.9 8.9 - 89.6 mg/dL   GFR, Estimated >39 >39 mL/min   Anion gap 9 5 - 15   Abdomen is soft and flat and non tender Incision steri strips are changed  Right corner still slightly separated  Left corner almost completely closed   IMPRESSION: Post op TAH/BS Post op ileus Von Willebrands  PLAN: Improved a lot  Per G Surgery - maybe try regular diet today  Discharge question - ?antibiotics to go home on Will defer to G Surgery about when ready for discharge

## 2024-01-12 NOTE — Progress Notes (Signed)
 Progress Note     Subjective: Passing some flatus.  Some pain with passing flatus in her rectum, but improved today.  Bloating and crampiness seems improved.  Tolerating full liquids  Objective: Vital signs in last 24 hours: Temp:  [98 F (36.7 C)-99.2 F (37.3 C)] 98 F (36.7 C) (09/04 0808) Pulse Rate:  [80-100] 80 (09/04 0808) Resp:  [16-18] 16 (09/04 0808) BP: (101-129)/(58-80) 111/68 (09/04 0808) SpO2:  [97 %-100 %] 98 % (09/04 0808) Last BM Date : 01/11/24  Intake/Output from previous day: No intake/output data recorded. Intake/Output this shift: No intake/output data recorded.  PE: General: Pleasant female who is laying in bed in NAD. Abd: Soft. Minimal tenderness around incision, No rebound tenderness or guarding.    Lab Results:  Recent Labs    01/12/24 0301  WBC 7.6  HGB 11.1*  HCT 34.6*  PLT 476*   BMET Recent Labs    01/10/24 0628 01/12/24 0301  NA 142 137  K 3.9 3.9  CL 105 103  CO2 24 25  GLUCOSE 91 91  BUN <5* <5*  CREATININE 0.82 0.87  CALCIUM 9.0 8.9   PT/INR No results for input(s): LABPROT, INR in the last 72 hours. CMP     Component Value Date/Time   NA 137 01/12/2024 0301   NA 150 (H) 01/01/2019 1635   K 3.9 01/12/2024 0301   CL 103 01/12/2024 0301   CO2 25 01/12/2024 0301   GLUCOSE 91 01/12/2024 0301   BUN <5 (L) 01/12/2024 0301   BUN 7 01/01/2019 1635   CREATININE 0.87 01/12/2024 0301   CREATININE 0.78 03/25/2020 1135   CREATININE 0.59 09/07/2011 1801   CALCIUM 8.9 01/12/2024 0301   PROT 6.7 01/09/2024 0740   PROT 7.6 01/01/2019 1635   ALBUMIN 3.6 01/09/2024 0740   ALBUMIN 4.5 01/01/2019 1635   AST 15 01/09/2024 0740   AST 15 03/25/2020 1135   ALT 17 01/09/2024 0740   ALT 12 03/25/2020 1135   ALKPHOS 60 01/09/2024 0740   BILITOT 0.6 01/09/2024 0740   BILITOT 0.5 03/25/2020 1135   GFRNONAA >60 01/12/2024 0301   GFRNONAA >60 03/25/2020 1135   GFRAA >60 01/16/2019 1137   Lipase     Component Value  Date/Time   LIPASE 32 01/06/2024 2311       Studies/Results: DG Abd Portable 1V Result Date: 01/11/2024 CLINICAL DATA:  13914 Abdominal distention 13914 EXAM: PORTABLE ABDOMEN - 1 VIEW COMPARISON:  January 08, 2024 FINDINGS: Interval removal of the esophagogastric tube. Nonobstructive bowel gas pattern. Gaseous distension of the colon with scattered contrast in the distal colon rectum. No pneumoperitoneum. No organomegaly or radiopaque calculi. Cholecystectomy clips. The lung bases are clear.Lumbosacral fusion hardware again noted. IMPRESSION: Nonobstructive bowel gas pattern. Electronically Signed   By: Rogelia Myers M.D.   On: 01/11/2024 14:44     Anti-infectives: Anti-infectives (From admission, onward)    Start     Dose/Rate Route Frequency Ordered Stop   01/08/24 2100  metroNIDAZOLE  (FLAGYL ) IVPB 500 mg        500 mg 100 mL/hr over 60 Minutes Intravenous Every 12 hours 01/08/24 1200     01/08/24 1300  cefTRIAXone  (ROCEPHIN ) 2 g in sodium chloride  0.9 % 100 mL IVPB        2 g 200 mL/hr over 30 Minutes Intravenous Every 24 hours 01/08/24 1200     01/07/24 0900  metroNIDAZOLE  (FLAGYL ) IVPB 500 mg  Status:  Discontinued  500 mg 100 mL/hr over 60 Minutes Intravenous Every 12 hours 01/07/24 0818 01/08/24 1200   01/07/24 0530  cefTRIAXone  (ROCEPHIN ) 2 g in sodium chloride  0.9 % 100 mL IVPB        2 g 200 mL/hr over 30 Minutes Intravenous  Once 01/07/24 0518 01/07/24 0718   01/07/24 0530  azithromycin  (ZITHROMAX ) 500 mg in sodium chloride  0.9 % 250 mL IVPB        500 mg 250 mL/hr over 60 Minutes Intravenous  Once 01/07/24 0518 01/07/24 0836        Assessment/Plan Post op state: S/p TAH/BSO/LOA   Readmit for SBO vs ileus ? Developing abscess - pelvic free fluid; WBC normalized 9/1, afebrile. TMAX 99 -AF, VSS - tolerating FLD diet -some pain with passing flatus in her pelvis/rectum.  Will monitor this as can be a sign of developing pelvic abscess, but she's AF and WBC is  normal. -adv to soft diet today.  If tolerates and continues to do well, may be ready to DC tomorrow   Left adrenal nodule - Will need MR abd to eval adrenal nodule, but should wait until abdomen is less uncomfortable     FEN: soft; IV fluids per primary team VTE: SCDs ID: Metronidazole  currently    LOS: 5 days   I reviewed specialist notes, nursing notes, last 24 h vitals and pain scores, last 48 h intake and output, last 24 h labs and trends, and last 24 h imaging results.   Burnard FORBES Banter, [facility] 01/12/2024, 9:11 AM Please see Amion for pager number during day hours 7:00am-4:30pm

## 2024-01-12 NOTE — Progress Notes (Signed)
 [facility] Progress Note Patient: Ann [name] FMW:[id number] DOB: 1980-10-24  DOA: 01/06/2024 DOS: the patient was seen and examined on 01/12/2024  Brief Hospital Course: 43 year old with a history of von Willebrand's disease, anxiety/depression, morbid obesity status post gastric sleeve, and dysmenorrhea and menorrhagia with fibroids who underwent bilateral salpingectomy and total abdominal hysterectomy with extensive LOA 8/19 per Dr. Mat, with her postoperative course complicated by ileus as well as anemia requiring blood transfusion. She was discharged from the hospital 01/01/2024, and returned to the ER 8/30 with complaints of persistent severe abdominal pain. This was associated with nausea and vomiting anytime she attempted to eat. CT abd/pelvis in the ER were suggestive of early SBO with a discrete transition point adjacent to the postoperative abdominal wall. Incidentally noted was a 2.8 cm R adrenal nodule new since previous imaging 2009.   [facility] has been consulted for medical management.  Assessment and Plan: Small bowel obstruction versus ileus Possible developing abscess. Patient underwent total abdominal hysterectomy with bilateral salpingectomy and extensive lysis of adhesions on 8/19 by Dr. Mat. She presents with complaints of lower abdominal pain. CT concerning for small bowel obstruction with possible developing abscess. Started on ceftriaxone  and Flagyl .  On 8/29. General Surgery consulted by primary team. Management per surgery.   Possible pneumonia versus atelectasis-both ruled out. CT scan noted bilateral lung atelectasis. Currently does not have any symptoms of pneumonia or respiratory illness. Patient is able to take deep breath. On exam no crackles heard. Monitor clinically.  Hypokalemia Corrected.  Maintain K more than 4.   Postoperative anemia History of von Willebrand's disease. Baseline hemoglobin around 12. Postop  dropped around 7.1. For last 3 checks stable around 10. SP 2 PRBC transfusion on 8/21.  Right adrenal nodule 2.8 cm right adrenal nodule noted on CT. Will need MRI of the abdomen to further eval, recommend outpatient study once she recovers from current illness.. Currently asymptomatic.  Therefore no indication in the hospital.   Subjective: Had a BM.  No nausea, vomiting and tolerating oral diet.  Physical Exam: Bowel sound present. No edema.  Data Reviewed: I have Reviewed nursing notes, Vitals, and Lab results. Reviewed CBC and BMP. Discussed with general surgery and gynecology.  Given her medical stability, we will sign off today.  Do not hesitate to call us  back.  Vitals:   01/11/24 2030 01/12/24 0510 01/12/24 0808 01/12/24 1540  BP: 111/80 129/66 111/68 112/73  Pulse: 89 80 80 93  Resp: 18 18 16 18   Temp: 98.4 F (36.9 C) 99.2 F (37.3 C) 98 F (36.7 C) 98.1 F (36.7 C)  TempSrc: Oral Axillary Oral Oral  SpO2: 98% 100% 98% 99%     Author: Yetta Blanch, MD 01/12/2024 6:19 PM  Please look on [email or web address] to find out who is on call.

## 2024-01-13 ENCOUNTER — Inpatient Hospital Stay (HOSPITAL_COMMUNITY)

## 2024-01-13 MED ORDER — DOCUSATE SODIUM 100 MG PO CAPS
100.0000 mg | ORAL_CAPSULE | Freq: Two times a day (BID) | ORAL | 0 refills | Status: AC
Start: 2024-01-13 — End: ?

## 2024-01-13 MED ORDER — AMOXICILLIN-POT CLAVULANATE 875-125 MG PO TABS: 1.0000 | ORAL_TABLET | Freq: Two times a day (BID) | ORAL | 0 refills | Status: DC

## 2024-01-13 MED ORDER — METRONIDAZOLE 375 MG PO CAPS: 375.0000 mg | ORAL_CAPSULE | Freq: Two times a day (BID) | ORAL | 0 refills | Status: DC

## 2024-01-13 MED ORDER — IOHEXOL 350 MG/ML SOLN
75.0000 mL | Freq: Once | INTRAVENOUS | Status: AC | PRN
  Administered 2024-01-13: 75 mL via INTRAVENOUS

## 2024-01-13 NOTE — Discharge Summary (Signed)
 Date of Admission: January 07, 2024  Date of Discharge: January 13, 2024  Admission Diagnosis: Post op TAH Post op ileus,  Rule out Small bowel obstruction  Discharge Diagnosis: Post op TAH Ileus  Hospital Course: 43 year old female admitted after TAH/BS. She presented with abdominal pain, nausea, and low grade fever. CT on admission concerning for SBO/Ileus and maybe developing abscess. She was given IV antibiotics on admission and then given NG tube and placed NPO.  She never spiked a fever during hospital stay and WBC remained WNL. Hemoglobin was stable as well. After SBO ruled out, patient was slowly advanced on her diet after Flatus returned and ileus resolved.   On Day prior to discharge, she was tolerating a regular soft diet and had BMs and had flatus. CT scan on day of discharge revealed resolved fluid collections and no abscess formation. Patient followed and co managed with general surgery  She was transitioned to oral flagyl  and augmentin  on day of discharge for an additional week. She will followup in office next week with Dr Mat She was given precautions to call if fever or pain or nausea or vomiting.  BP 129/71 (BP Location: Left Arm)   Pulse 93   Temp 98.3 F (36.8 C) (Oral)   Resp 16   LMP 12/02/2023 (Approximate)   SpO2 100%  No results found for this or any previous visit (from the past 24 hours). Abdomen soft and non tender and non distended

## 2024-01-13 NOTE — Plan of Care (Signed)

## 2024-01-13 NOTE — Progress Notes (Signed)
 Patient tolerating soft diet. BM yesterday after breakfast. No flatus after that. Some pain with BM. No nausea  Overall feeling much better.  BP 121/71 (BP Location: Left Arm)   Pulse 86   Temp 98.4 F (36.9 C) (Oral)   Resp 16   LMP 12/02/2023 (Approximate)   SpO2 99%  No results found for this or any previous visit (from the past 24 hours). Abdomen is soft and non distended  Incision left corner is closed now Right corner still slightly separated and steri strips are replaced    IMPRESSION: Post op TAH Ileus  PLAN: Patient clinically improved Remained afebrile with normal WBC After discussion with General surgery team will repeat CT scan today to follow up fluid collection and make sure there is not an organized abscess If CT is clear then anticipate discharge home later today  Plan reviewed with patient and all questions are answered

## 2024-01-13 NOTE — Plan of Care (Signed)

## 2024-01-13 NOTE — Progress Notes (Signed)
 Progress Note     Subjective: Had a BM yesterday after breakfast and still had significant pain passing this.  Hasn't passed flatus or BM since that time.  Got a little bloated after lunch, but otherwise tolerating her solid diet well.  Objective: Vital signs in last 24 hours: Temp:  [98.1 F (36.7 C)-98.7 F (37.1 C)] 98.3 F (36.8 C) (09/05 0821) Pulse Rate:  [86-93] 93 (09/05 0821) Resp:  [16-18] 16 (09/05 0821) BP: (112-129)/(71-84) 129/71 (09/05 0821) SpO2:  [99 %-100 %] 100 % (09/05 0821) Last BM Date : 01/12/24  Intake/Output from previous day: 09/04 0701 - 09/05 0700 In: 708 [P.O.:708] Out: -  Intake/Output this shift: No intake/output data recorded.  PE: General: Pleasant female who is laying in bed in NAD. Abd: Soft. Minimal tenderness around incision, No rebound tenderness or guarding.    Lab Results:  Recent Labs    01/12/24 0301  WBC 7.6  HGB 11.1*  HCT 34.6*  PLT 476*   BMET Recent Labs    01/12/24 0301  NA 137  K 3.9  CL 103  CO2 25  GLUCOSE 91  BUN <5*  CREATININE 0.87  CALCIUM 8.9   PT/INR No results for input(s): LABPROT, INR in the last 72 hours. CMP     Component Value Date/Time   NA 137 01/12/2024 0301   NA 150 (H) 01/01/2019 1635   K 3.9 01/12/2024 0301   CL 103 01/12/2024 0301   CO2 25 01/12/2024 0301   GLUCOSE 91 01/12/2024 0301   BUN <5 (L) 01/12/2024 0301   BUN 7 01/01/2019 1635   CREATININE 0.87 01/12/2024 0301   CREATININE 0.78 03/25/2020 1135   CREATININE 0.59 09/07/2011 1801   CALCIUM 8.9 01/12/2024 0301   PROT 6.7 01/09/2024 0740   PROT 7.6 01/01/2019 1635   ALBUMIN 3.6 01/09/2024 0740   ALBUMIN 4.5 01/01/2019 1635   AST 15 01/09/2024 0740   AST 15 03/25/2020 1135   ALT 17 01/09/2024 0740   ALT 12 03/25/2020 1135   ALKPHOS 60 01/09/2024 0740   BILITOT 0.6 01/09/2024 0740   BILITOT 0.5 03/25/2020 1135   GFRNONAA >60 01/12/2024 0301   GFRNONAA >60 03/25/2020 1135   GFRAA >60 01/16/2019 1137    Lipase     Component Value Date/Time   LIPASE 32 01/06/2024 2311       Studies/Results: DG Abd Portable 1V Result Date: 01/11/2024 CLINICAL DATA:  13914 Abdominal distention 13914 EXAM: PORTABLE ABDOMEN - 1 VIEW COMPARISON:  January 08, 2024 FINDINGS: Interval removal of the esophagogastric tube. Nonobstructive bowel gas pattern. Gaseous distension of the colon with scattered contrast in the distal colon rectum. No pneumoperitoneum. No organomegaly or radiopaque calculi. Cholecystectomy clips. The lung bases are clear.Lumbosacral fusion hardware again noted. IMPRESSION: Nonobstructive bowel gas pattern. Electronically Signed   By: Rogelia Myers M.D.   On: 01/11/2024 14:44     Anti-infectives: Anti-infectives (From admission, onward)    Start     Dose/Rate Route Frequency Ordered Stop   01/08/24 2100  metroNIDAZOLE  (FLAGYL ) IVPB 500 mg        500 mg 100 mL/hr over 60 Minutes Intravenous Every 12 hours 01/08/24 1200     01/08/24 1300  cefTRIAXone  (ROCEPHIN ) 2 g in sodium chloride  0.9 % 100 mL IVPB        2 g 200 mL/hr over 30 Minutes Intravenous Every 24 hours 01/08/24 1200     01/07/24 0900  metroNIDAZOLE  (FLAGYL ) IVPB 500 mg  Status:  Discontinued        500 mg 100 mL/hr over 60 Minutes Intravenous Every 12 hours 01/07/24 0818 01/08/24 1200   01/07/24 0530  cefTRIAXone  (ROCEPHIN ) 2 g in sodium chloride  0.9 % 100 mL IVPB        2 g 200 mL/hr over 30 Minutes Intravenous  Once 01/07/24 0518 01/07/24 0718   01/07/24 0530  azithromycin  (ZITHROMAX ) 500 mg in sodium chloride  0.9 % 250 mL IVPB        500 mg 250 mL/hr over 60 Minutes Intravenous  Once 01/07/24 0518 01/07/24 0836        Assessment/Plan Post op state: S/p TAH/BSO/LOA   Readmit for SBO vs ileus ? Developing abscess - pelvic free fluid; WBC normalized 9/1, afebrile. TMAX 99 -AF, VSS - tolerating soft diet -some pain with passing flatus/BM in her pelvis/rectum.can be an indication for a pelvic fluid collection  (infected vs sterile).  Will order a CT scan today to assess for this.  If looks good, can DC home later today.  If fluid collection present and drainable, will d/w IR. -D/W Dr. Mat at the bedside  Left adrenal nodule - Will need MR abd to eval adrenal nodule, but should wait until abdomen is less uncomfortable     FEN: soft; IV fluids per primary team VTE: SCDs ID: Rocephin /Metronidazole      LOS: 6 days   I reviewed specialist notes, nursing notes, last 24 h vitals and pain scores, last 48 h intake and output, last 24 h labs and trends, and last 24 h imaging results.   Burnard FORBES Banter, [facility] 01/13/2024, 8:24 AM Please see Amion for pager number during day hours 7:00am-4:30pm

## 2024-01-17 NOTE — Progress Notes (Signed)
 Transitional Care Management- Unable to Reach patient   Unable to reach patient after 1 telephone attempt(s).   Per chart review pt is following up with another PCP elsewhere. I updated her chart with the changes.   Is this visit eligible for TCM? No

## 2024-01-18 ENCOUNTER — Emergency Department (HOSPITAL_COMMUNITY): Admission: EM | Admit: 2024-01-18 | Discharge: 2024-01-18 | Disposition: A | Discharge status: Home / Self Care

## 2024-01-18 ENCOUNTER — Encounter (HOSPITAL_COMMUNITY): Payer: Self-pay | Admitting: Emergency Medicine

## 2024-01-18 ENCOUNTER — Emergency Department (HOSPITAL_COMMUNITY)

## 2024-01-18 ENCOUNTER — Other Ambulatory Visit: Payer: Self-pay

## 2024-01-18 DIAGNOSIS — J45909 Unspecified asthma, uncomplicated: Secondary | ICD-10-CM | POA: Insufficient documentation

## 2024-01-18 DIAGNOSIS — G8918 Other acute postprocedural pain: Secondary | ICD-10-CM | POA: Diagnosis not present

## 2024-01-18 DIAGNOSIS — R1033 Periumbilical pain: Secondary | ICD-10-CM | POA: Diagnosis present

## 2024-01-18 LAB — CBC WITH DIFFERENTIAL/PLATELET
Abs Immature Granulocytes: 0.02 K/uL (ref 0.00–0.07)
Basophils Absolute: 0 K/uL (ref 0.0–0.1)
Basophils Relative: 1 %
Eosinophils Absolute: 0.1 K/uL (ref 0.0–0.5)
Eosinophils Relative: 2 %
HCT: 36.6 % (ref 36.0–46.0)
Hemoglobin: 11.6 g/dL — ABNORMAL LOW (ref 12.0–15.0)
Immature Granulocytes: 0 %
Lymphocytes Relative: 25 %
Lymphs Abs: 2.1 K/uL (ref 0.7–4.0)
MCH: 30.8 pg (ref 26.0–34.0)
MCHC: 31.7 g/dL (ref 30.0–36.0)
MCV: 97.1 fL (ref 80.0–100.0)
Monocytes Absolute: 0.5 K/uL (ref 0.1–1.0)
Monocytes Relative: 6 %
Neutro Abs: 5.5 K/uL (ref 1.7–7.7)
Neutrophils Relative %: 66 %
Platelets: 464 K/uL — ABNORMAL HIGH (ref 150–400)
RBC: 3.77 MIL/uL — ABNORMAL LOW (ref 3.87–5.11)
RDW: 14.4 % (ref 11.5–15.5)
WBC: 8.2 K/uL (ref 4.0–10.5)
nRBC: 0 % (ref 0.0–0.2)

## 2024-01-18 LAB — COMPREHENSIVE METABOLIC PANEL WITH GFR
ALT: 17 U/L (ref 0–44)
AST: 22 U/L (ref 15–41)
Albumin: 3.8 g/dL (ref 3.5–5.0)
Alkaline Phosphatase: 60 U/L (ref 38–126)
Anion gap: 6 (ref 5–15)
BUN: 6 mg/dL (ref 6–20)
CO2: 25 mmol/L (ref 22–32)
Calcium: 9.2 mg/dL (ref 8.9–10.3)
Chloride: 104 mmol/L (ref 98–111)
Creatinine, Ser: 0.68 mg/dL (ref 0.44–1.00)
GFR, Estimated: >60 mL/min (ref >60)
Glucose, Bld: 85 mg/dL (ref 70–99)
Potassium: 4.2 mmol/L (ref 3.5–5.1)
Sodium: 135 mmol/L (ref 135–145)
Total Bilirubin: 0.5 mg/dL (ref 0.0–1.2)
Total Protein: 6.7 g/dL (ref 6.5–8.1)

## 2024-01-18 LAB — URINALYSIS, ROUTINE W REFLEX MICROSCOPIC
Bacteria, UA: NONE SEEN
Bilirubin Urine: NEGATIVE
Glucose, UA: NEGATIVE mg/dL
Hgb urine dipstick: NEGATIVE
Ketones, ur: NEGATIVE mg/dL
Nitrite: NEGATIVE
Protein, ur: NEGATIVE mg/dL
Specific Gravity, Urine: 1.017 (ref 1.005–1.030)
pH: 6 (ref 5.0–8.0)

## 2024-01-18 LAB — LIPASE, BLOOD: Lipase: 25 U/L (ref 11–51)

## 2024-01-18 MED ORDER — IOHEXOL 350 MG/ML SOLN
100.0000 mL | Freq: Once | INTRAVENOUS | Status: AC | PRN
  Administered 2024-01-18: 100 mL via INTRAVENOUS

## 2024-01-18 MED ORDER — HYDROMORPHONE HCL 1 MG/ML IJ SOLN
1.0000 mg | Freq: Once | INTRAMUSCULAR | Status: AC
  Administered 2024-01-18: 1 mg via INTRAVENOUS
  Filled 2024-01-18: qty 1

## 2024-01-18 MED ORDER — PROMETHAZINE HCL 25 MG RE SUPP: 25.0000 mg | Freq: Four times a day (QID) | RECTAL | 0 refills | Status: DC | PRN

## 2024-01-18 MED ORDER — KETOROLAC TROMETHAMINE 15 MG/ML IJ SOLN
15.0000 mg | Freq: Once | INTRAMUSCULAR | Status: AC
  Administered 2024-01-18: 15 mg via INTRAVENOUS
  Filled 2024-01-18: qty 1

## 2024-01-18 MED ORDER — PROMETHAZINE HCL 25 MG PO TABS: 25.0000 mg | ORAL_TABLET | Freq: Four times a day (QID) | ORAL | 0 refills | Status: DC | PRN

## 2024-01-18 MED ORDER — ONDANSETRON HCL 4 MG/2ML IJ SOLN
4.0000 mg | Freq: Once | INTRAMUSCULAR | Status: AC
  Administered 2024-01-18: 4 mg via INTRAVENOUS
  Filled 2024-01-18: qty 2

## 2024-01-18 NOTE — ED Triage Notes (Signed)
 Pt reports hysterectomy 8/19 with readmission due to abscess and SBO. Pt had post-op appt today and reports severe pain. Sent from pain control and possible MRI. Pt on dilaudid  with no relief.

## 2024-01-18 NOTE — Discharge Instructions (Signed)
 Labs and CT scan today did not show any acute findings. Take the prescribed medication as directed-- oral first line, suppository for back up. Make sure to continue cradling your stomach when up and about, sometimes support of pillow may help as well. Follow-up with your OB-GYN. Return to the ED for new or worsening symptoms.

## 2024-01-18 NOTE — ED Provider Notes (Signed)
 [facility] AT [facility] Provider Note   CSN: [id number] Arrival date & time: 01/18/24  1318     Patient presents with: No chief complaint on file.   Ann [name] is a 43 y.o. female.   The history is provided by the patient and medical records.   43 y.o. F with history of asthma, gait disturbance, MS, von Willebrand's disease, recent TAH 12/27/23 with Dr. Mat complicated by abscess and SBO requiring readmission on 01/06/2024, presenting to the ED for abdominal pain.  States she had been doing well over the past few days post discharge but pain returned yesterday.  States a lot of pain/pressure when up and moving about.  Still having a lot of trouble eating as it makes her vomit afterwards.  She is having BM's with use of stool softener but only passing flatus with the BM.  Denies fever/chills.  States she saw Dr. Mat in office today, felt like she was ok from GYN standpoint.  She tried to get her into the general surgery clinic today but after talking with on call provider   Prior to Admission medications   Medication Sig Start Date End Date Taking? Authorizing Provider  albuterol  (VENTOLIN  HFA) 108 (90 Base) MCG/ACT inhaler Inhale 1-2 puffs into the lungs every 6 (six) hours as needed for wheezing or shortness of breath. 06/12/23   Raspet, Erin K, PA-C  ALPRAZolam  (XANAX ) 0.5 MG tablet Take 0.5 mg by mouth 2 (two) times daily as needed for anxiety. 09/27/22   [provider]  amLODipine  (NORVASC ) 5 MG tablet Take 5 mg by mouth in the morning. 05/06/23   [provider]  amoxicillin -clavulanate (AUGMENTIN ) 875-125 MG tablet Take 1 tablet by mouth 2 (two) times daily. 01/13/24   Mat Browning, MD  amphetamine -dextroamphetamine  (ADDERALL) 10 MG tablet Take 1 tablet (10 mg total) by mouth daily with breakfast. Take one pill  po  once or twice in the afternoon Patient taking differently: Take 10 mg by mouth See admin instructions.  Take 1 tablet (10 mg) by mouth scheduled in the morning, and may take 1-2 tablets (10 mg -20 mg) by mouth in the afternoon--if needed for attention/focus 12/01/23   Sater, Charlie LABOR, MD  docusate sodium  (COLACE) 100 MG capsule Take 1 capsule (100 mg total) by mouth 2 (two) times daily. 01/13/24   Mat Browning, MD  FLUoxetine  (PROZAC ) 20 MG capsule Take 60 mg by mouth in the morning. 12/14/19   [provider]  HYDROmorphone  (DILAUDID ) 2 MG tablet Take 1 tablet (2 mg total) by mouth every 3 (three) hours as needed for severe pain (pain score 7-10) ((when tolerating fluids)). 01/01/24   Mat Browning, MD  lisdexamfetamine (VYVANSE ) 60 MG capsule Take 1 capsule (60 mg total) by mouth every morning. 12/19/23   Sater, Charlie LABOR, MD  metronidazole  (FLAGYL ) 375 MG capsule Take 1 capsule (375 mg total) by mouth 2 (two) times daily. 01/13/24   Mat Browning, MD  pantoprazole  (PROTONIX ) 40 MG tablet Take 1 tablet (40 mg total) by mouth 2 (two) times daily. 01/01/24   Mat Browning, MD  senna-docusate (SENOKOT-S) 8.6-50 MG tablet Take 1 tablet by mouth 2 (two) times daily as needed (constipation.). 11/07/20   [provider]  tiZANidine  (ZANAFLEX ) 4 MG tablet TAKE 1 TABLET(4 MG) BY MOUTH EVERY 8 HOURS AS NEEDED FOR MUSCLE SPASMS 11/14/23   Sater, Charlie LABOR, MD  Ublituximab -xiiy (BRIUMVI  IV) Inject into the vein every 6 (six) months.  [provider]    Allergies: Prochlorperazine , Codeine, Darvocet [propoxyphene n-acetaminophen ], Sulfa antibiotics, and Sulfamethoxazole    Review of Systems  Gastrointestinal:  Positive for abdominal pain, nausea and vomiting.  All other systems reviewed and are negative.   Updated Vital Signs BP (!) 129/95   Pulse 92   Temp 98.6 F (37 C)   Resp 20   Ht 5' 2 (1.575 m)   Wt 84 kg   LMP 12/02/2023 (Approximate)   SpO2 100%   BMI 33.87 kg/m   Physical Exam Vitals and nursing note reviewed.  Constitutional:      Appearance: She is  well-developed.  HENT:     Head: Normocephalic and atraumatic.  Eyes:     Conjunctiva/sclera: Conjunctivae normal.     Pupils: Pupils are equal, round, and reactive to light.  Cardiovascular:     Rate and Rhythm: Normal rate and regular rhythm.     Heart sounds: Normal heart sounds.  Pulmonary:     Effort: Pulmonary effort is normal.     Breath sounds: Normal breath sounds.  Abdominal:     General: Bowel sounds are normal.     Palpations: Abdomen is soft.     Tenderness: There is abdominal tenderness in the periumbilical area.  Musculoskeletal:        General: Normal range of motion.     Cervical back: Normal range of motion.  Skin:    General: Skin is warm and dry.  Neurological:     Mental Status: She is alert and oriented to person, place, and time.     (all labs ordered are listed, but only abnormal results are displayed) Labs Reviewed  CBC WITH DIFFERENTIAL/PLATELET - Abnormal; Notable for the following components:      Result Value   RBC 3.77 (*)    Hemoglobin 11.6 (*)    Platelets 464 (*)    All other components within normal limits  URINALYSIS, ROUTINE W REFLEX MICROSCOPIC - Abnormal; Notable for the following components:   Leukocytes,Ua SMALL (*)    All other components within normal limits  COMPREHENSIVE METABOLIC PANEL WITH GFR  LIPASE, BLOOD    EKG: None  Radiology: CT ABDOMEN PELVIS W CONTRAST Result Date: 01/18/2024 EXAM: CT ABDOMEN AND PELVIS WITH CONTRAST 01/18/2024 04:05:20 PM TECHNIQUE: CT of the abdomen and pelvis was performed with the administration of intravenous contrast. Multiplanar reformatted images are provided for review. Automated exposure control, iterative reconstruction, and/or weight-based adjustment of the mA/kV was utilized to reduce the radiation dose to as low as reasonably achievable. COMPARISON: CT of the abdomen and pelvis 01/13/2024. CLINICAL HISTORY: Abdominal pain, recent hospitalization following hysterectomy  complications/SBO/ileus. FINDINGS: LOWER CHEST: Minimal atelectasis is present at both lung bases. LIVER: Mild fatty infiltration of the liver is present without a discrete lesion. GALLBLADDER AND BILE DUCTS: Cholecystectomy. SPLEEN: No acute abnormality. PANCREAS: No acute abnormality. ADRENAL GLANDS: A 2.3 cm left adrenal adenoma is stable. KIDNEYS, URETERS AND BLADDER: Simple cysts in the left kidney are stable measuring 12 and 13 mm respectively. No stones in the kidneys or ureters. No hydronephrosis. No perinephric or periureteral stranding. Urinary bladder is contracted and somewhat thick walled. GI AND BOWEL: Partial gastrectomy once again noted. There is no bowel obstruction. PERITONEUM AND RETROPERITONEUM: Small amount of free fluid is again seen within the anatomic pelvis. No free air. VASCULATURE: Aorta is normal in caliber. LYMPH NODES: No lymphadenopathy. REPRODUCTIVE ORGANS: Hysterectomy and bilateral oophorectomy is again noted. Vaginal cuff wall is thickened, similar to  the prior study. No discrete fluid collection or abscess is present. BONES AND SOFT TISSUES: Postoperative changes are again noted in the lower lumbar spine with adjacent level disease at L3-4. No acute osseous abnormality. No focal soft tissue abnormality. IMPRESSION: 1. Thickened vaginal cuff wall, similar to the prior study, without discrete fluid collection or abscess. 2. No evidence of bowel obstruction. 3. Stable 2.3 cm left adrenal adenoma. 4. Stable simple cysts in the left kidney. Electronically signed by: Lonni Necessary MD 01/18/2024 04:25 PM EDT RP Workstation: [id number]     Procedures   Medications Ordered in the ED  ketorolac  (TORADOL ) 15 MG/ML injection 15 mg (15 mg Intravenous Given 01/18/24 1423)  ondansetron  (ZOFRAN ) injection 4 mg (4 mg Intravenous Given 01/18/24 1424)  iohexol  (OMNIPAQUE ) 350 MG/ML injection 100 mL (100 mLs Intravenous Contrast Given 01/18/24 1601)  HYDROmorphone  (DILAUDID ) injection 1  mg (1 mg Intravenous Given 01/18/24 2254)  ondansetron  (ZOFRAN ) injection 4 mg (4 mg Intravenous Given 01/18/24 2254)                                    Medical Decision Making Amount and/or Complexity of Data Reviewed Radiology: ordered and independent interpretation performed. ECG/medicine tests: ordered and independent interpretation performed.  Risk Prescription drug management.   43 y.o. F here with continued abdominal pain s/p TAH 12/27/23 with Dr. Mat complicated by abscess and SBO, just discharged 5 days ago.  Continued pain, particularly when up and about.  Some continued nausea/vomiting.  Afebrile, non-toxic in appearance here.  Tenderness around periumbilical area.  Not peritoneal.  Labs as above without leukocytosis or electrolyte derangement.  CT without any acute findings.  Results discussed with patient.  She was cleared by GYN today, from their standpoint everything was okay but has recommended general surgery evaluation.  Will discuss with on-call provider.  10:55 PM Spoke with on call general surgery, Dr. Ann-- has reviewed CT scan and agrees no acute findings.  Usually would not recommend MRI for intraabdominal findings and does not feel it will provide us  any additional information at this time.    Discussed with patient, comfortable with discharge.  Had pain meds refilled today from GYN today, will refill nausea meds.  Advised to continue bland diet for now, continued cradling abdomen when up and about.  Encouraged close follow-up with GYN.  Return here for new concerns.  Final diagnoses:  Post-operative pain    ED Discharge Orders          Ordered    promethazine  (PHENERGAN ) 25 MG suppository  Every 6 hours PRN        01/18/24 2311    promethazine  (PHENERGAN ) 25 MG tablet  Every 6 hours PRN        01/18/24 2311               Jarold Olam HERO, PA-C 01/18/24 2318    Kammerer, Megan L, DO 01/19/24 1535

## 2024-01-18 NOTE — ED Provider Triage Note (Signed)
 Emergency Medicine Provider Triage Evaluation Note  Ann [name] , a 43 y.o. female  was evaluated in triage.  Pt complains of abdominal pain.  Patient underwent hysterectomy on 8/19, she was subsequently rehospitalized on 8/29 for postoperative complications including SBO/ileus.  She was discharged on p.o. antibiotics, Augmentin  and Flagyl , which she continues to take.  She was on Dilaudid  at home but reports that this is not helping her pain.  Describes lower abdominal pain that is worse with movement, she has been able to tolerate cereal but any other foods make her vomit. No fever at home, no diarrhea, regular BM's but no flatulence.  Review of Systems  Positive: As above Negative: As above  Physical Exam  BP 136/89   Pulse 99   Temp 97.6 F (36.4 C)   Resp 16   Ht 5' 2 (1.575 m)   Wt 84 kg   LMP 12/02/2023 (Approximate)   SpO2 97%   BMI 33.87 kg/m  Gen:   Awake, no distress   Resp:  Normal effort  MSK:   Moves extremities without difficulty  Other:  Abdomen is diffusely tender in lower quadrants, no rigidity  Medical Decision Making  Medically screening exam initiated at 2:13 PM.  Appropriate orders placed.  Ann [name] was informed that the remainder of the evaluation will be completed by another provider, this initial triage assessment does not replace that evaluation, and the importance of remaining in the ED until their evaluation is complete.     Glendia Rocky SAILOR, [place] 01/18/24 1414

## 2024-01-19 ENCOUNTER — Ambulatory Visit (INDEPENDENT_AMBULATORY_CARE_PROVIDER_SITE_OTHER): Admitting: Neurology

## 2024-01-19 ENCOUNTER — Encounter: Payer: Self-pay | Admitting: Neurology

## 2024-01-19 VITALS — BP 120/83 | HR 95 | Ht 62.0 in | Wt 189.0 lb

## 2024-01-19 DIAGNOSIS — G35D Multiple sclerosis, unspecified: Secondary | ICD-10-CM

## 2024-01-19 DIAGNOSIS — G35 Multiple sclerosis: Secondary | ICD-10-CM | POA: Diagnosis not present

## 2024-01-19 DIAGNOSIS — R269 Unspecified abnormalities of gait and mobility: Secondary | ICD-10-CM | POA: Diagnosis not present

## 2024-01-19 DIAGNOSIS — F988 Other specified behavioral and emotional disorders with onset usually occurring in childhood and adolescence: Secondary | ICD-10-CM

## 2024-01-19 DIAGNOSIS — R5383 Other fatigue: Secondary | ICD-10-CM

## 2024-01-19 DIAGNOSIS — F419 Anxiety disorder, unspecified: Secondary | ICD-10-CM

## 2024-01-19 DIAGNOSIS — Z79899 Other long term (current) drug therapy: Secondary | ICD-10-CM

## 2024-01-19 DIAGNOSIS — G35A Relapsing-remitting multiple sclerosis: Secondary | ICD-10-CM

## 2024-01-19 MED ORDER — HYDROXYZINE HCL 10 MG PO TABS: 10.0000 mg | ORAL_TABLET | Freq: Three times a day (TID) | ORAL | 1 refills | Status: AC | PRN

## 2024-01-19 MED ORDER — LISDEXAMFETAMINE DIMESYLATE 60 MG PO CAPS: 60.0000 mg | ORAL_CAPSULE | Freq: Every morning | ORAL | 0 refills | Status: DC

## 2024-01-19 NOTE — Progress Notes (Addendum)
 [facility]  PATIENT: Ann [name] DOB: 1980-08-19  REFERRING DOCTOR OR PCP: Vernell Pass (PCP); Aisha Seals (neuro hospitalist) SOURCE: Patient, notes from recent hospital admission, lab reports, imaging reports, MRI images personally reviewed  _________________________________   HISTORICAL  CHIEF COMPLAINT:  Chief Complaint  Patient presents with   RM10/MS    Pt is here Alone. Pt states that she has been stable since her last appointment.     HISTORY OF PRESENT ILLNESS:  Ann [name] is a 43 y.o. woman with relapsing remitting MS diagnosed 12/2018.     Update 01/19/2024 She is on Briumvi  infusion and tolerates it well.   She feels good - next infusion is 03/2024.   Before Briumvi , she was on Ocrevus and tolerated it okay though did have some mild infusion reactions.  She also felt a little worse the last month of each cycle.  Due to that, she opted to go on Briumvi  instead of Ocrevus.  MRI has been stable and no exaceration.  Her last brain MRI was 11/04/2022 showing no new lesions.  Gait is ok - currently affected by recent surgery.   Sometimes balance is mildly off   She fell last week due to balance.  She uses the bannister on stairs.   Her baseline is that she can walk > 1 mile (now less with recent surgery).    Parestheias have improved  Denies weakness or spasticity.  Bladder is doing better with less hesitancy.      She had a hysterectomy in August but had bleeding post-op requiring a transfusion and also had a bowel obstruction.  She was also placed on antibiotics for a suspected infection.   Currently on Augmentin  and Flagyl .    Vision is stable.    She reports depression and is on fluoxetine .   More stress going through a divorce,  Fatigue bothers her more this year.  She has more stress and feels she does slightly worse neurologically when more stressed.   She was sleeping well with tizanidine  but with more stress doing worse.   Therefore, she also takes a xanax  many nights  She is on Vyvanse  with benefit for attention and fatigue.  Adding the om adderall has helped.  Besides fatigue and reduced attention, she has some word finding difficulty at times.    MS History: She had the onset of slurred speech 12/10/2018.   When symptoms persisted, she went to the ED a few days later.   Her Ct scan was normal.   The MRi was consistent with MS.    One focus enhanced helping to confirm the diagnosis.   MRI cervical spine did not show any MS plaque according to the official interpretation.  However, by my interpretation there are 2 foci within the spinal cord, one medially slightly to the left adjacent to C3 and another focus adjacent to T3-T4 noted on the sagittal images but not covered on the axial views..    She had 5 days of IV Solumedrol and her speech was much better by the time she was discharged.       She actually had noted some clumsiness with her gait in early July 2020.  She still feels a little clumsy though she is better than she was last month.  Of note, she has had dysesthesias in the chest since this event.  Also, she had right visual blurriness that started 5-6 years ago.   She doesn't recall an actual diagnosis but she did  have intra-ocular steroid injections.   Imaging The MRI of the brain 12/12/2020 shows multiple T2/flair hyperintense foci in the periventricular, juxtacortical deep white matter consistent with demyelinating plaque.  One focus in the left deep/periventricular white matter enhanced after contrast consistent with an acute demyelinating plaque.  This plaque was also hyperintense on diffusion-weighted images.  The brainstem was fine.  MRI of the cervical spine 12/13/2018 shows 2 plaques, one small focus ventromedially towards the left adjacent to C3 and another focus adjacent to T3-T4 (not covered on the axial views)  MRI brain 12/03/2021 showed no new MS lesions.   MRI Brain 11/04/2022 showed T2/FLAIR  hyperintense foci in the cerebral hemispheres in a pattern consistent with chronic demyelinating plaque associated with multiple sclerosis. None of the foci enhanced or appear to be acute. Compared to the MRI from 12/03/2021, there are no new lesions.   REVIEW OF SYSTEMS: Constitutional: No fevers, chills, sweats, or change in appetite.  She has fatigue. Eyes: No visual changes, double vision, eye pain Ear, nose and throat: No hearing loss, ear pain, nasal congestion, sore throat Cardiovascular: No chest pain, palpitations Respiratory:  No shortness of breath at rest or with exertion.   No wheezes GastrointestinaI: No nausea, vomiting, diarrhea, abdominal pain, fecal incontinence Genitourinary:  No dysuria, urinary retention or frequency.  No nocturia. Musculoskeletal:  multiple joint pain Integumentary: No rash, pruritus, skin lesions Neurological: as above Psychiatric: No depression at this time.  No anxiety Endocrine: No palpitations, diaphoresis, change in appetite, change in weigh or increased thirst Hematologic/Lymphatic:  No anemia, purpura, petechiae.  She has von Willebrand's disease not requiring any treatment Allergic/Immunologic: No itchy/runny eyes, nasal congestion, recent allergic reactions, rashes  ALLERGIES: Allergies  Allergen Reactions   Prochlorperazine  Other (See Comments)    Jaws locked up per pt   Codeine Itching    Able to take, but needs Benadryl    Darvocet [Propoxyphene N-Acetaminophen ] Nausea And Vomiting   Sulfa Antibiotics Rash   Sulfamethoxazole Rash    HOME MEDICATIONS:  Current Outpatient Medications:    albuterol  (VENTOLIN  HFA) 108 (90 Base) MCG/ACT inhaler, Inhale 1-2 puffs into the lungs every 6 (six) hours as needed for wheezing or shortness of breath., Disp: 18 g, Rfl: 0   ALPRAZolam  (XANAX ) 0.5 MG tablet, Take 0.5 mg by mouth 2 (two) times daily as needed for anxiety., Disp: , Rfl:    amLODipine  (NORVASC ) 5 MG tablet, Take 5 mg by mouth in the  morning., Disp: , Rfl:    amoxicillin -clavulanate (AUGMENTIN ) 875-125 MG tablet, Take 1 tablet by mouth 2 (two) times daily., Disp: 14 tablet, Rfl: 0   amphetamine -dextroamphetamine  (ADDERALL) 10 MG tablet, Take 1 tablet (10 mg total) by mouth daily with breakfast. Take one pill  po  once or twice in the afternoon (Patient taking differently: Take 10 mg by mouth See admin instructions. Take 1 tablet (10 mg) by mouth scheduled in the morning, and may take 1-2 tablets (10 mg -20 mg) by mouth in the afternoon--if needed for attention/focus), Disp: 60 tablet, Rfl: 0   docusate sodium  (COLACE) 100 MG capsule, Take 1 capsule (100 mg total) by mouth 2 (two) times daily., Disp: 10 capsule, Rfl: 0   FLUoxetine  (PROZAC ) 20 MG capsule, Take 60 mg by mouth in the morning., Disp: , Rfl:    HYDROmorphone  (DILAUDID ) 2 MG tablet, Take 1 tablet (2 mg total) by mouth every 3 (three) hours as needed for severe pain (pain score 7-10) ((when tolerating fluids))., Disp: 30 tablet, Rfl:  0   metronidazole  (FLAGYL ) 375 MG capsule, Take 1 capsule (375 mg total) by mouth 2 (two) times daily., Disp: 14 capsule, Rfl: 0   pantoprazole  (PROTONIX ) 40 MG tablet, Take 1 tablet (40 mg total) by mouth 2 (two) times daily., Disp: 60 tablet, Rfl: 3   promethazine  (PHENERGAN ) 25 MG suppository, Place 1 suppository (25 mg total) rectally every 6 (six) hours as needed for nausea or vomiting., Disp: 12 suppository, Rfl: 0   senna-docusate (SENOKOT-S) 8.6-50 MG tablet, Take 1 tablet by mouth 2 (two) times daily as needed (constipation.)., Disp: , Rfl:    tiZANidine  (ZANAFLEX ) 4 MG tablet, TAKE 1 TABLET(4 MG) BY MOUTH EVERY 8 HOURS AS NEEDED FOR MUSCLE SPASMS, Disp: 90 tablet, Rfl: 1   Ublituximab -xiiy (BRIUMVI  IV), Inject into the vein every 6 (six) months., Disp: , Rfl:    hydrOXYzine  (ATARAX ) 10 MG tablet, Take 1 tablet (10 mg total) by mouth 3 (three) times daily as needed for itching., Disp: 90 tablet, Rfl: 1   lisdexamfetamine (VYVANSE ) 60  MG capsule, Take 1 capsule (60 mg total) by mouth every morning., Disp: 30 capsule, Rfl: 0   promethazine  (PHENERGAN ) 25 MG tablet, Take 1 tablet (25 mg total) by mouth every 6 (six) hours as needed for nausea or vomiting. (Patient not taking: Reported on 01/19/2024), Disp: 20 tablet, Rfl: 0  PAST MEDICAL HISTORY: Past Medical History:  Diagnosis Date   ADD (attention deficit disorder)    takes Adderall daily   Anemia    Anxiety    takes Xanax  daily as needed   Arthritis    Asthma    Mild. Only flares up when sick   Bipolar disorder (HCC)    Blood dyscrasia    Von Willebrand   Chronic lower back pain    Clotting disorder    Depression    has meds prescribed but doesn't take them   Dysrhythmia    Sinus Arrythmia   Family history of adverse reaction to anesthesia    Mom gets PONV too   GERD (gastroesophageal reflux disease)    Headache    monthly (02/27/2015)   Hemorrhoids    History of esophagogastroduodenoscopy (EGD)    Hypertension    Migraine    none in the last 6 months (02/27/2015)   Multiple sclerosis    Nocturia    Pneumonia several times   PONV (postoperative nausea and vomiting)    Von Willebrand disease (HCC)    Dr. Freddie    PAST SURGICAL HISTORY: Past Surgical History:  Procedure Laterality Date   ABDOMINAL HYSTERECTOMY     BREAST CYST EXCISION Left 02/20/2018   Procedure: EXCISION OF LEFT BREAST SEBACEOUS CYST;  Surgeon: Ebbie Cough, MD;  Location: [facility];  Service: General;  Laterality: Left;   BREAST REDUCTION SURGERY Bilateral 02/27/2015   Procedure: BILATERAL BREAST REDUCTION WITH FREE NIPPLE GRAFT TECHNIQUE ON RIGHT BREAST;  Surgeon: Alm Sick, MD;  Location: [facility] OR;  Service: Plastics;  Laterality: Bilateral;   CESAREAN SECTION  2004; 2013   2004, 2013   CHOLECYSTECTOMY  03/02/2012   Procedure: LAPAROSCOPIC CHOLECYSTECTOMY WITH INTRAOPERATIVE CHOLANGIOGRAM;  Surgeon: Sherlean JINNY Laughter, MD;  Location: [facility] OR;   Service: General;  Laterality: N/A;   COLONOSCOPY     DILATION AND CURETTAGE OF UTERUS  ~ 1997; ~ 2005   DILITATION & CURRETTAGE/HYSTROSCOPY WITH THERMACHOICE ABLATION  04/28/2012   Procedure: DILATATION & CURETTAGE/HYSTEROSCOPY WITH THERMACHOICE ABLATION;  Surgeon: Rosaline LITTIE Cobble, MD;  Location: [facility];  Service: Gynecology;  Laterality: N/A;   ERCP  03/03/2012   Procedure: ENDOSCOPIC RETROGRADE CHOLANGIOPANCREATOGRAPHY (ERCP);  Surgeon: Belvie JONETTA Just, MD;  Location: [facility] ENDOSCOPY;  Service: Endoscopy;  Laterality: N/A;  dl/hung   HYSTERECTOMY ABDOMINAL WITH SALPINGECTOMY Bilateral 12/27/2023   Procedure: HYSTERECTOMY, TOTAL, ABDOMINAL, WITH SALPINGECTOMY;  Surgeon: Mat Browning, MD;  Location: [facility] OR;  Service: Gynecology;  Laterality: Bilateral;   LAPAROSCOPIC GASTRIC SLEEVE RESECTION     with [facility]   LAPAROSCOPY  2011   LUMBAR DISC SURGERY  2003; 2008; 2009   MULTIPLE TOOTH EXTRACTIONS  scattered dates   POSTERIOR LUMBAR FUSION  2010   TUBAL LIGATION  2013   WISDOM TOOTH EXTRACTION  2001    FAMILY HISTORY: Family History  Problem Relation Age of Onset   Colon polyps Mother    Stroke Mother    Arthritis Mother    Depression Mother    COPD Father    Kidney disease Paternal Uncle    Stroke Maternal Grandfather    Heart disease Paternal Grandfather    Multiple sclerosis Neg Hx    Colon cancer Neg Hx    Esophageal cancer Neg Hx    Rectal cancer Neg Hx    Stomach cancer Neg Hx     SOCIAL HISTORY:  Social History   Socioeconomic History   Marital status: Married    Spouse name: Not on file   Number of children: Not on file   Years of education: Not on file   Highest education level: Not on file  Occupational History   Occupation: regulatory affairs  Tobacco Use   Smoking status: Former    Current packs/day: 0.00    Average packs/day: 0.3 packs/day for 2.0 years (0.5 ttl pk-yrs)    Types: Cigarettes    Start date: 61    Quit date: 2000    Years  since quitting: 25.7   Smokeless tobacco: Never  Vaping Use   Vaping status: Never Used  Substance and Sexual Activity   Alcohol use: Yes    Alcohol/week: 0.0 standard drinks of alcohol    Comment: social   Drug use: Never   Sexual activity: Yes    Birth control/protection: None    Comment: BTL  Other Topics Concern   Not on file  Social History Narrative   Lives with husband and kids   Caffeine use:    16 oz per day   Right handed   Social Drivers of Health   Financial Resource Strain: Not on file  Food Insecurity: No Food Insecurity (01/07/2024)   Hunger Vital Sign    Worried About Running Out of Food in the Last Year: Never true    Ran Out of Food in the Last Year: Never true  Transportation Needs: No Transportation Needs (01/07/2024)   PRAPARE - Administrator, Civil Service (Medical): No    Lack of Transportation (Non-Medical): No  Physical Activity: Not on file  Stress: Not on file  Social Connections: Not on file  Intimate Partner Violence: Not At Risk (01/07/2024)   Humiliation, Afraid, Rape, and Kick questionnaire    Fear of Current or Ex-Partner: No    Emotionally Abused: No    Physically Abused: No    Sexually Abused: No     PHYSICAL EXAM  Vitals:   01/19/24 0826  BP: 120/83  Pulse: 95  SpO2: 99%  Weight: 189 lb (85.7 kg)  Height: 5' 2 (1.575 m)      Body mass  index is 34.57 kg/m.   General: The patient is well-developed and well-nourished and in no acute distress  HEENT:  Head is [place]/AT.  Sclera are anicteric.     Skin: Extremities are without rash or  edema.   Neurologic Exam  Mental status: The patient is alert and oriented x 3 at the time of the examination. The patient has apparent normal recent and remote memory, with an apparently normal attention span and concentration ability.   Speech is normal.  Cranial nerves: Extraocular movements are full. Pupils are equal, round, and reactive to light and accomodation. Mildly  reduced color vision OD.  Facial symmetry is present. There is good facial sensation to soft touch bilaterally.Facial strength is normal.  Trapezius and sternocleidomastoid strength is normal. No dysarthria is noted.   No obvious hearing deficits are noted.  Motor:  Muscle bulk is normal.  Muscle tone is normal.  Strength is 5/5.SABRA   Sensory: Sensory testing is intact to pinprick, soft touch and vibration sensation in all 4 extremities.  Coordination: Cerebellar testing reveals good finger-nose-finger and heel-to-shin bilaterally.  Gait and station: Station is normal.   The gait is normal.  Tandem gait is mildly wide. Romberg is negative   Reflexes: Deep tendon reflexes are symmetric and normal bilaterally.      DIAGNOSTIC DATA (LABS, IMAGING, TESTING) - I reviewed patient records, labs, notes, testing and imaging myself where available.  Lab Results  Component Value Date   WBC 8.2 01/18/2024   HGB 11.6 (L) 01/18/2024   HCT 36.6 01/18/2024   MCV 97.1 01/18/2024   PLT 464 (H) 01/18/2024      Component Value Date/Time   NA 135 01/18/2024 1437   NA 150 (H) 01/01/2019 1635   K 4.2 01/18/2024 1437   CL 104 01/18/2024 1437   CO2 25 01/18/2024 1437   GLUCOSE 85 01/18/2024 1437   BUN 6 01/18/2024 1437   BUN 7 01/01/2019 1635   CREATININE 0.68 01/18/2024 1437   CREATININE 0.78 03/25/2020 1135   CREATININE 0.59 09/07/2011 1801   CALCIUM 9.2 01/18/2024 1437   PROT 6.7 01/18/2024 1437   PROT 7.6 01/01/2019 1635   ALBUMIN 3.8 01/18/2024 1437   ALBUMIN 4.5 01/01/2019 1635   AST 22 01/18/2024 1437   AST 15 03/25/2020 1135   ALT 17 01/18/2024 1437   ALT 12 03/25/2020 1135   ALKPHOS 60 01/18/2024 1437   BILITOT 0.5 01/18/2024 1437   BILITOT 0.5 03/25/2020 1135   GFRNONAA >60 01/18/2024 1437   GFRNONAA >60 03/25/2020 1135   GFRAA >60 01/16/2019 1137   Lab Results  Component Value Date   CHOL 185 12/14/2018   HDL 52 12/14/2018   LDLCALC 122 (H) 12/14/2018   TRIG 56 12/14/2018    CHOLHDL 3.6 12/14/2018   Lab Results  Component Value Date   HGBA1C 5.1 12/14/2018   Lab Results  Component Value Date   VITAMINB12 331 01/16/2019   Lab Results  Component Value Date   TSH 0.979 12/13/2018       ASSESSMENT AND PLAN  1. Multiple sclerosis, relapsing-remitting   2. Multiple sclerosis   3. High risk medication use   4. Gait disturbance   5. Anxiety   6. Other fatigue   7. Attention deficit disorder, unspecified type      1.  Continue Briumvi .   No exacerbations.  Check IgG/IgM.  Next year we will check another MRI to determine if there is any subclinical progression. 2.  Bladder is  stable.  However, if it worsens again, consider re-adding Rapaflo .   3.   Vyvanse  for MS related ADD.    Continue extra 10 mg Adderall after lunch.  Renew hydroxyzine  as needed for itching 4.   She will return to see us  in 6-7 months or sooner if there are new or worsening neurologic symptoms.     Nimisha Rathel A. Vear, MD, PhD, FAAN Certified in Neurology, Clinical Neurophysiology, Sleep Medicine, Pain Medicine and Neuroimaging Director, [facility] at [facility]  [facility] [address] [place], [place] [address] [phone]

## 2024-01-20 ENCOUNTER — Ambulatory Visit: Payer: Self-pay | Admitting: Neurology

## 2024-01-20 LAB — IGG, IGA, IGM
IgA/Immunoglobulin A, Serum: 272 mg/dL (ref 87–352)
IgG (Immunoglobin G), Serum: 943 mg/dL (ref 586–1602)
IgM (Immunoglobulin M), Srm: 45 mg/dL (ref 26–217)

## 2024-02-01 ENCOUNTER — Encounter: Payer: Self-pay | Admitting: Neurology

## 2024-02-02 DIAGNOSIS — Z0289 Encounter for other administrative examinations: Secondary | ICD-10-CM

## 2024-02-13 ENCOUNTER — Telehealth: Payer: Self-pay

## 2024-02-13 NOTE — Telephone Encounter (Signed)
 Completed pt's paperwork and got MD to sign. Giving paperwork to Medical Records 02/13/24

## 2024-02-14 ENCOUNTER — Telehealth: Payer: Self-pay | Admitting: *Deleted

## 2024-02-14 NOTE — Telephone Encounter (Signed)
 Pt form faxed [phone]

## 2024-02-17 ENCOUNTER — Other Ambulatory Visit: Payer: Self-pay | Admitting: Obstetrics and Gynecology

## 2024-02-17 DIAGNOSIS — E279 Disorder of adrenal gland, unspecified: Secondary | ICD-10-CM

## 2024-02-21 ENCOUNTER — Encounter: Payer: Self-pay | Admitting: Neurology

## 2024-02-22 ENCOUNTER — Encounter: Payer: Self-pay | Admitting: *Deleted

## 2024-03-01 ENCOUNTER — Ambulatory Visit
Admission: RE | Admit: 2024-03-01 | Discharge: 2024-03-01 | Disposition: A | Source: Ambulatory Visit | Attending: Obstetrics and Gynecology | Admitting: Obstetrics and Gynecology

## 2024-03-01 DIAGNOSIS — E279 Disorder of adrenal gland, unspecified: Secondary | ICD-10-CM

## 2024-03-01 MED ORDER — GADOPICLENOL 0.5 MMOL/ML IV SOLN
9.0000 mL | Freq: Once | INTRAVENOUS | Status: AC | PRN
  Administered 2024-03-01: 9 mL via INTRAVENOUS

## 2024-03-02 ENCOUNTER — Encounter: Payer: Self-pay | Admitting: Neurology

## 2024-03-02 ENCOUNTER — Other Ambulatory Visit: Payer: Self-pay

## 2024-03-02 MED ORDER — LISDEXAMFETAMINE DIMESYLATE 60 MG PO CAPS: 60.0000 mg | ORAL_CAPSULE | Freq: Every morning | ORAL | 0 refills | Status: DC

## 2024-03-02 NOTE — Telephone Encounter (Signed)
 Pt Last Seen 01/19/24 Upcoming Appointment 01/24/2025  Vyvanse  Last filled 01/20/24

## 2024-03-20 ENCOUNTER — Telehealth: Payer: Self-pay | Admitting: Neurology

## 2024-03-20 NOTE — Telephone Encounter (Signed)
 New MS ICD 10 code: G35.A/RRMS

## 2024-03-20 NOTE — Telephone Encounter (Signed)
 Stacy with BRIUMVI called to speak to Nurse about Pt medication  need updates Glade states  she need  missing ICD  10 coded on medication order for Pt. As of 10-1- 25  all  ICD 10  has changes for MS ,  [phone]

## 2024-03-20 NOTE — Telephone Encounter (Signed)
 Spoke w/ Stacy/[facility]. She will fax form for us  to complete today and we will fax back.

## 2024-03-26 ENCOUNTER — Telehealth: Payer: Self-pay | Admitting: Neurology

## 2024-03-26 ENCOUNTER — Other Ambulatory Visit: Payer: Self-pay | Admitting: Neurology

## 2024-03-26 ENCOUNTER — Encounter: Payer: Self-pay | Admitting: Neurology

## 2024-03-26 ENCOUNTER — Ambulatory Visit: Attending: Neurology

## 2024-03-26 DIAGNOSIS — R55 Syncope and collapse: Secondary | ICD-10-CM

## 2024-03-26 NOTE — Telephone Encounter (Signed)
 Pt made request to make an appointment due to a 2nd episode of passing out, 1st being 10-6 and again on 11-14.  Pt connected to billing dept to address bad debt bal.

## 2024-03-26 NOTE — Progress Notes (Unsigned)
 Enrolled for [employer] to mail a ZIO XT long term holter monitor to the patients address on file.   EP to read

## 2024-03-26 NOTE — Telephone Encounter (Signed)
 Call to patient, she reports having 2 syncope episodes on 10/6 and last Friday 11/14. She has stayed hydrated and denies any heart palpitations. She reports feeling dizzy and then just collapsing. She did report hitting her forehead on the 11/14 fall and her chin on the 10/6 fall. She did not go and get evaluated. Advised she may need a new referral but I would send to Dr. Vear to review. Last Briumvi  infusion was 2 weeks ago per patient.

## 2024-03-26 NOTE — Telephone Encounter (Signed)
 Pt has taken care of bad debt bal. She has scheduled a f/u to be seen about passing out on 10-6 and 11-14, she would like to discuss these episodes with RN, please call, pt also on wait list.

## 2024-03-26 NOTE — Telephone Encounter (Signed)
 Call to patient, advised of Dr. Vear advice/recommendations. Patient verbalzied understanding and agreeable to 12/15 visit

## 2024-04-03 ENCOUNTER — Ambulatory Visit: Admitting: Neurology

## 2024-04-05 ENCOUNTER — Other Ambulatory Visit: Payer: Self-pay | Admitting: Neurology

## 2024-04-06 ENCOUNTER — Other Ambulatory Visit: Payer: Self-pay

## 2024-04-16 ENCOUNTER — Encounter: Payer: Self-pay | Admitting: Neurology

## 2024-04-16 MED ORDER — LISDEXAMFETAMINE DIMESYLATE 60 MG PO CAPS: 60.0000 mg | ORAL_CAPSULE | Freq: Every morning | ORAL | 0 refills | Status: DC

## 2024-04-16 NOTE — Telephone Encounter (Signed)
 Requested Prescriptions   Pending Prescriptions Disp Refills   lisdexamfetamine (VYVANSE ) 60 MG capsule 30 capsule 0    Sig: Take 1 capsule (60 mg total) by mouth every morning.   Last seen 01/19/24 Next appt 04/23/24  Dispenses   Dispensed Days Supply Quantity Provider Pharmacy  LISDEXAMFETAMINE 60MG  CAP 03/02/2024 30 30 each Sater, Charlie LABOR, MD [facility] [phone] ...  LISDEXAMFETAMINE 60MG  CAP 01/20/2024 30 30 each Sater, Charlie LABOR, MD [facility] [phone] ...  LISDEXAMFETAMINE 60MG  CAP 12/19/2023 30 30 each Sater, Charlie LABOR, MD [facility] [phone] ...  LISDEXAMFETAMINE 60MG  CAP 11/16/2023 30 30 each Sater, Charlie LABOR, MD [facility] [phone] ...  LISDEXAMFETAMINE 60MG  CAP 10/09/2023 30 30 each Sater, Charlie LABOR, MD [facility] [phone] ...  LISDEXAMFETAMINE 60MG  CAP 08/25/2023 30 30 each Sater, Charlie LABOR, MD [facility] [phone] ...  LISDEXAMFETAMINE 60MG  CAP 07/22/2023 30 30 each Sater, Charlie LABOR, MD [facility] [phone] ...  LISDEXAMFETAMINE 60MG  CAP 06/15/2023 30 30 each Sater, Charlie LABOR, MD [facility] [phone] ...  LISDEXAMFETAMINE 60MG  CAP 05/05/2023 30 30 each Buck Saucer, MD [facility] [phone] .SABRASABRA

## 2024-04-23 ENCOUNTER — Ambulatory Visit: Admitting: Neurology

## 2024-04-23 ENCOUNTER — Encounter: Payer: Self-pay | Admitting: Neurology

## 2024-04-23 VITALS — BP 124/88 | HR 87 | Ht 62.0 in | Wt 171.5 lb

## 2024-04-23 DIAGNOSIS — G35A Relapsing-remitting multiple sclerosis: Secondary | ICD-10-CM

## 2024-04-23 DIAGNOSIS — I951 Orthostatic hypotension: Secondary | ICD-10-CM | POA: Diagnosis not present

## 2024-04-23 DIAGNOSIS — F988 Other specified behavioral and emotional disorders with onset usually occurring in childhood and adolescence: Secondary | ICD-10-CM | POA: Diagnosis not present

## 2024-04-23 DIAGNOSIS — R5383 Other fatigue: Secondary | ICD-10-CM

## 2024-04-23 DIAGNOSIS — Z79899 Other long term (current) drug therapy: Secondary | ICD-10-CM

## 2024-04-23 DIAGNOSIS — R269 Unspecified abnormalities of gait and mobility: Secondary | ICD-10-CM | POA: Diagnosis not present

## 2024-04-23 NOTE — Progress Notes (Signed)
 [facility]  PATIENT: Ann [name] DOB: Nov 17, 1980  REFERRING DOCTOR OR PCP: Vernell Pass (PCP); Aisha Seals (neuro hospitalist) SOURCE: Patient, notes from recent hospital admission, lab reports, imaging reports, MRI images personally reviewed  _________________________________   HISTORICAL  CHIEF COMPLAINT:  Chief Complaint  Patient presents with   Follow-up    Follow up, alone, MS follow up- pt reported taking semaglutide (unsure of dose she will send mychart message later with dose)    HISTORY OF PRESENT ILLNESS:  Ann [name] is a 43 y.o. woman with relapsing remitting MS diagnosed 12/2018.     Update 04/23/2024 She passed out twice in last 3 months.  Both episodes were near identical.   She was sitting on bed and got up to use the bathroom, felt dizzy, sat back down and got up a second time and after a few steps passed out.   She has a couple pre-syncope episodes that occurred after standing up.    In August 2025, amlodpine was switched from 5 mg to 10 mg.   She did have a similar event in 2024.    She is on Briumvi  infusion and tolerates it well.   She feels good - last infusion was 03/2024.   Before Briumvi , she was on Ocrevus and tolerated it okay though did have some mild infusion reactions.  She also felt a little worse the last month of each cycle.  Due to that, she opted to go on Briumvi  instead of Ocrevus.  MRI has been stable and no exaceration.  Her last brain MRI was 11/04/2022 showing no new lesions.  Gait is stable.   Sometimes balance is mildly off   She fell last week due to balance.  She uses the bannister on stairs.   Her baseline is that she can walk > 1 mile (now less with recent surgery).    Parestheias have improved  Denies weakness or spasticity.    Bladder is doing better with less hesitancy.    Vision is stable.    She reports depression and is on fluoxetine .   More stress going through a divorce,  She has fatigue.    She sleeps better with tizanidine .  SABRA  Therefore, she also takes a xanax  many nights  She is on Vyvanse  which improves her attention and fatigue.   Besides reduced attention, she has some word finding difficulty at times.      MS History: She had the onset of slurred speech 12/10/2018.   When symptoms persisted, she went to the ED a few days later.   Her Ct scan was normal.   The MRi was consistent with MS.    One focus enhanced helping to confirm the diagnosis.   MRI cervical spine did not show any MS plaque according to the official interpretation.  However, by my interpretation there are 2 foci within the spinal cord, one medially slightly to the left adjacent to C3 and another focus adjacent to T3-T4 noted on the sagittal images but not covered on the axial views..    She had 5 days of IV Solumedrol and her speech was much better by the time she was discharged.       She actually had noted some clumsiness with her gait in early July 2020.  She still feels a little clumsy though she is better than she was last month.  Of note, she has had dysesthesias in the chest since this event.  Also, she had right visual  blurriness that started 5-6 years ago.   She doesn't recall an actual diagnosis but she did have intra-ocular steroid injections.   Imaging The MRI of the brain 12/12/2020 shows multiple T2/flair hyperintense foci in the periventricular, juxtacortical deep white matter consistent with demyelinating plaque.  One focus in the left deep/periventricular white matter enhanced after contrast consistent with an acute demyelinating plaque.  This plaque was also hyperintense on diffusion-weighted images.  The brainstem was fine.  MRI of the cervical spine 12/13/2018 shows 2 plaques, one small focus ventromedially towards the left adjacent to C3 and another focus adjacent to T3-T4 (not covered on the axial views)  MRI brain 12/03/2021 showed no new MS lesions.   MRI Brain 11/04/2022 showed T2/FLAIR  hyperintense foci in the cerebral hemispheres in a pattern consistent with chronic demyelinating plaque associated with multiple sclerosis. None of the foci enhanced or appear to be acute. Compared to the MRI from 12/03/2021, there are no new lesions.   REVIEW OF SYSTEMS: Constitutional: No fevers, chills, sweats, or change in appetite.  She has fatigue. Eyes: No visual changes, double vision, eye pain Ear, nose and throat: No hearing loss, ear pain, nasal congestion, sore throat Cardiovascular: No chest pain, palpitations Respiratory:  No shortness of breath at rest or with exertion.   No wheezes GastrointestinaI: No nausea, vomiting, diarrhea, abdominal pain, fecal incontinence Genitourinary:  No dysuria, urinary retention or frequency.  No nocturia. Musculoskeletal:  multiple joint pain Integumentary: No rash, pruritus, skin lesions Neurological: as above Psychiatric: No depression at this time.  No anxiety Endocrine: No palpitations, diaphoresis, change in appetite, change in weigh or increased thirst Hematologic/Lymphatic:  No anemia, purpura, petechiae.  She has von Willebrand's disease not requiring any treatment Allergic/Immunologic: No itchy/runny eyes, nasal congestion, recent allergic reactions, rashes  ALLERGIES: Allergies  Allergen Reactions   Prochlorperazine  Other (See Comments)    Jaws locked up per pt   Codeine Itching    Able to take, but needs Benadryl    Darvocet [Propoxyphene N-Acetaminophen ] Nausea And Vomiting   Sulfa Antibiotics Rash   Sulfamethoxazole Rash    HOME MEDICATIONS:  Current Outpatient Medications:    albuterol  (VENTOLIN  HFA) 108 (90 Base) MCG/ACT inhaler, Inhale 1-2 puffs into the lungs every 6 (six) hours as needed for wheezing or shortness of breath., Disp: 18 g, Rfl: 0   ALPRAZolam  (XANAX ) 0.5 MG tablet, Take 0.5 mg by mouth 2 (two) times daily as needed for anxiety., Disp: , Rfl:    amLODipine  (NORVASC ) 5 MG tablet, Take 5 mg by mouth in the  morning., Disp: , Rfl:    amphetamine -dextroamphetamine  (ADDERALL) 10 MG tablet, Take 1 tablet (10 mg total) by mouth daily with breakfast. Take one pill  po  once or twice in the afternoon (Patient taking differently: Take 10 mg by mouth See admin instructions. Take 1 tablet (10 mg) by mouth scheduled in the morning, and may take 1-2 tablets (10 mg -20 mg) by mouth in the afternoon--if needed for attention/focus), Disp: 60 tablet, Rfl: 0   docusate sodium  (COLACE) 100 MG capsule, Take 1 capsule (100 mg total) by mouth 2 (two) times daily., Disp: 10 capsule, Rfl: 0   FLUoxetine  (PROZAC ) 20 MG capsule, Take 60 mg by mouth in the morning., Disp: , Rfl:    HYDROmorphone  (DILAUDID ) 2 MG tablet, Take 1 tablet (2 mg total) by mouth every 3 (three) hours as needed for severe pain (pain score 7-10) ((when tolerating fluids))., Disp: 30 tablet, Rfl: 0   hydrOXYzine  (  ATARAX ) 10 MG tablet, Take 1 tablet (10 mg total) by mouth 3 (three) times daily as needed for itching., Disp: 90 tablet, Rfl: 1   lisdexamfetamine  (VYVANSE ) 60 MG capsule, Take 1 capsule (60 mg total) by mouth every morning., Disp: 30 capsule, Rfl: 0   tiZANidine  (ZANAFLEX ) 4 MG tablet, TAKE 1 TABLET(4 MG) BY MOUTH EVERY 8 HOURS AS NEEDED FOR MUSCLE SPASMS, Disp: 90 tablet, Rfl: 0   Ublituximab -xiiy (BRIUMVI  IV), Inject into the vein every 6 (six) months., Disp: , Rfl:   PAST MEDICAL HISTORY: Past Medical History:  Diagnosis Date   ADD (attention deficit disorder)    takes Adderall daily   Anemia    Anxiety    takes Xanax  daily as needed   Arthritis    Asthma    Mild. Only flares up when sick   Bipolar disorder (HCC)    Blood dyscrasia    Von Willebrand   Chronic lower back pain    Clotting disorder    Depression    has meds prescribed but doesn't take them   Dysrhythmia    Sinus Arrythmia   Family history of adverse reaction to anesthesia    Mom gets PONV too   GERD (gastroesophageal reflux disease)    Headache    monthly  (02/27/2015)   Hemorrhoids    History of esophagogastroduodenoscopy (EGD)    Hypertension    Migraine    none in the last 6 months (02/27/2015)   Multiple sclerosis    Nocturia    Pneumonia several times   PONV (postoperative nausea and vomiting)    Von Willebrand disease (HCC)    Dr. Freddie    PAST SURGICAL HISTORY: Past Surgical History:  Procedure Laterality Date   ABDOMINAL HYSTERECTOMY     BREAST CYST EXCISION Left 02/20/2018   Procedure: EXCISION OF LEFT BREAST SEBACEOUS CYST;  Surgeon: Ebbie Cough, MD;  Location: [facility];  Service: General;  Laterality: Left;   BREAST REDUCTION SURGERY Bilateral 02/27/2015   Procedure: BILATERAL BREAST REDUCTION WITH FREE NIPPLE GRAFT TECHNIQUE ON RIGHT BREAST;  Surgeon: Alm Sick, MD;  Location: [facility] OR;  Service: Plastics;  Laterality: Bilateral;   CESAREAN SECTION  2004; 2013   2004, 2013   CHOLECYSTECTOMY  03/02/2012   Procedure: LAPAROSCOPIC CHOLECYSTECTOMY WITH INTRAOPERATIVE CHOLANGIOGRAM;  Surgeon: Sherlean JINNY Laughter, MD;  Location: [facility] OR;  Service: General;  Laterality: N/A;   COLONOSCOPY     DILATION AND CURETTAGE OF UTERUS  ~ 1997; ~ 2005   DILITATION & CURRETTAGE/HYSTROSCOPY WITH THERMACHOICE ABLATION  04/28/2012   Procedure: DILATATION & CURETTAGE/HYSTEROSCOPY WITH THERMACHOICE ABLATION;  Surgeon: Rosaline LITTIE Cobble, MD;  Location: [facility];  Service: Gynecology;  Laterality: N/A;   ERCP  03/03/2012   Procedure: ENDOSCOPIC RETROGRADE CHOLANGIOPANCREATOGRAPHY (ERCP);  Surgeon: Belvie JONETTA Just, MD;  Location: [facility] ENDOSCOPY;  Service: Endoscopy;  Laterality: N/A;  dl/hung   HYSTERECTOMY ABDOMINAL WITH SALPINGECTOMY Bilateral 12/27/2023   Procedure: HYSTERECTOMY, TOTAL, ABDOMINAL, WITH SALPINGECTOMY;  Surgeon: Cobble Rosaline, MD;  Location[facility] OR;  Service: Gynecology;  Laterality: Bilateral;   LAPAROSCOPIC GASTRIC SLEEVE RESECTION     with [facility]   LAPAROSCOPY  2011   LUMBAR DISC SURGERY   2003; 2008; 2009   MULTIPLE TOOTH EXTRACTIONS  scattered dates   POSTERIOR LUMBAR FUSION  2010   TUBAL LIGATION  2013   WISDOM TOOTH EXTRACTION  2001    FAMILY HISTORY: Family History  Problem Relation Age of Onset   Colon polyps Mother  Stroke Mother    Arthritis Mother    Depression Mother    COPD Father    Kidney disease Paternal Uncle    Stroke Maternal Grandfather    Heart disease Paternal Grandfather    Multiple sclerosis Neg Hx    Colon cancer Neg Hx    Esophageal cancer Neg Hx    Rectal cancer Neg Hx    Stomach cancer Neg Hx     SOCIAL HISTORY:  Social History   Socioeconomic History   Marital status: Married    Spouse name: Not on file   Number of children: Not on file   Years of education: Not on file   Highest education level: Not on file  Occupational History   Occupation: regulatory affairs  Tobacco Use   Smoking status: Former    Current packs/day: 0.00    Average packs/day: 0.3 packs/day for 2.0 years (0.5 ttl pk-yrs)    Types: Cigarettes    Start date: 55    Quit date: 2000    Years since quitting: 25.9   Smokeless tobacco: Never  Vaping Use   Vaping status: Never Used  Substance and Sexual Activity   Alcohol use: Yes    Alcohol/week: 0.0 standard drinks of alcohol    Comment: social   Drug use: Never   Sexual activity: Yes    Birth control/protection: None    Comment: BTL  Other Topics Concern   Not on file  Social History Narrative   Lives with husband and kids   Caffeine use:    16 oz per day   Right handed   Social Drivers of Health   Tobacco Use: Medium Risk (04/23/2024)   Patient History    Smoking Tobacco Use: Former    Smokeless Tobacco Use: Never    Passive Exposure: Not on Actuary Strain: Not on file  Food Insecurity: No Food Insecurity (01/07/2024)   Epic    Worried About Programme Researcher, Broadcasting/film/video in the Last Year: Never true    Ran Out of Food in the Last Year: Never true  Transportation Needs: No  Transportation Needs (01/07/2024)   Epic    Lack of Transportation (Medical): No    Lack of Transportation (Non-Medical): No  Physical Activity: Not on file  Stress: Not on file  Social Connections: Not on file  Intimate Partner Violence: Not At Risk (01/07/2024)   Epic    Fear of Current or Ex-Partner: No    Emotionally Abused: No    Physically Abused: No    Sexually Abused: No  Depression (PHQ2-9): Not on file  Alcohol Screen: Not on file  Housing: Low Risk (01/07/2024)   Epic    Unable to Pay for Housing in the Last Year: No    Number of Times Moved in the Last Year: 0    Homeless in the Last Year: No  Utilities: Not At Risk (01/07/2024)   Epic    Threatened with loss of utilities: No  Health Literacy: Not on file     PHYSICAL EXAM  Vitals:   04/23/24 1439  BP: 124/88  Pulse: 87  SpO2: 98%  Weight: 171 lb 8 oz (77.8 kg)  Height: 5' 2 (1.575 m)   Orthostatic VS: 120/84 80 120/80  92 118/80 96   Body mass index is 31.37 kg/m.   General: The patient is well-developed and well-nourished and in no acute distress  HEENT:  Head is [place]/AT.  Sclera are anicteric.  Skin: Extremities are without rash or  edema.   Neurologic Exam  Mental status: The patient is alert and oriented x 3 at the time of the examination. The patient has apparent normal recent and remote memory, with an apparently normal attention span and concentration ability.   Speech is normal.  Cranial nerves: Extraocular movements are full. Pupils are equal, round, and reactive to light and accomodation. Mildly reduced color vision OD.  Facial symmetry is present. There is good facial sensation to soft touch bilaterally.Facial strength is normal.  Trapezius and sternocleidomastoid strength is normal. No dysarthria is noted.   No obvious hearing deficits are noted.  Motor:  Muscle bulk is normal.  Muscle tone is normal.  Strength is 5/5.SABRA   Sensory: Sensory testing is intact to pinprick, soft touch and  vibration sensation in all 4 extremities.  Coordination: Cerebellar testing reveals good finger-nose-finger and heel-to-shin bilaterally.  Gait and station: Station is normal.   The gait is normal.  Tandem gait is mildly wide. Romberg is negative   Reflexes: Deep tendon reflexes are symmetric and normal bilaterally.      DIAGNOSTIC DATA (LABS, IMAGING, TESTING) - I reviewed patient records, labs, notes, testing and imaging myself where available.  Lab Results  Component Value Date   WBC 8.2 01/18/2024   HGB 11.6 (L) 01/18/2024   HCT 36.6 01/18/2024   MCV 97.1 01/18/2024   PLT 464 (H) 01/18/2024      Component Value Date/Time   NA 135 01/18/2024 1437   NA 150 (H) 01/01/2019 1635   K 4.2 01/18/2024 1437   CL 104 01/18/2024 1437   CO2 25 01/18/2024 1437   GLUCOSE 85 01/18/2024 1437   BUN 6 01/18/2024 1437   BUN 7 01/01/2019 1635   CREATININE 0.68 01/18/2024 1437   CREATININE 0.78 03/25/2020 1135   CREATININE 0.59 09/07/2011 1801   CALCIUM 9.2 01/18/2024 1437   PROT 6.7 01/18/2024 1437   PROT 7.6 01/01/2019 1635   ALBUMIN 3.8 01/18/2024 1437   ALBUMIN 4.5 01/01/2019 1635   AST 22 01/18/2024 1437   AST 15 03/25/2020 1135   ALT 17 01/18/2024 1437   ALT 12 03/25/2020 1135   ALKPHOS 60 01/18/2024 1437   BILITOT 0.5 01/18/2024 1437   BILITOT 0.5 03/25/2020 1135   GFRNONAA >60 01/18/2024 1437   GFRNONAA >60 03/25/2020 1135   GFRAA >60 01/16/2019 1137   Lab Results  Component Value Date   CHOL 185 12/14/2018   HDL 52 12/14/2018   LDLCALC 122 (H) 12/14/2018   TRIG 56 12/14/2018   CHOLHDL 3.6 12/14/2018   Lab Results  Component Value Date   HGBA1C 5.1 12/14/2018   Lab Results  Component Value Date   VITAMINB12 331 01/16/2019   Lab Results  Component Value Date   TSH 0.979 12/13/2018       ASSESSMENT AND PLAN  1. Multiple sclerosis, relapsing-remitting   2. Gait disturbance   3. High risk medication use   4. Other fatigue   5. Attention deficit  disorder, unspecified type     1.  Continue Briumvi .   No exacerbations.  Check IgG/IgM.  Next year we will check another MRI to determine if there is any subclinical progression. 2.  She has had 2 recent episodes of syncope.  Both occurred upon standing and she has had other episodes of presyncope upon standing.  Today, she did not have orthostatic hypotension.  However, that is most likely her diagnosis.  If symptoms worsen and  she has several more episodes.  Consider a medication such as pyridostigmine or fludrocortisone.  We also discussed conservative treatment such as compression stockings 3.   Vyvanse  for MS related ADD.    Continue extra 10 mg Adderall after lunch.  Renew hydroxyzine  as needed for itching 4.    She will return to see us  in 6-7 months or sooner if there are new or worsening neurologic symptoms.     Bawi Lakins A. Vear, MD, PhD, FAAN Certified in Neurology, Clinical Neurophysiology, Sleep Medicine, Pain Medicine and Neuroimaging Director, [facility] at [facility]  [facility] [address] [place], [place] [address] [phone]

## 2024-05-01 DIAGNOSIS — R55 Syncope and collapse: Secondary | ICD-10-CM

## 2024-05-12 ENCOUNTER — Ambulatory Visit: Payer: Self-pay | Admitting: Neurology

## 2024-05-23 ENCOUNTER — Encounter: Payer: Self-pay | Admitting: Neurology

## 2024-05-23 MED ORDER — LISDEXAMFETAMINE DIMESYLATE 60 MG PO CAPS: 60.0000 mg | ORAL_CAPSULE | Freq: Every morning | ORAL | 0 refills | Status: AC

## 2024-05-23 NOTE — Telephone Encounter (Signed)
 Requested Prescriptions   Pending Prescriptions Disp Refills   lisdexamfetamine  (VYVANSE ) 60 MG capsule 30 capsule 0    Sig: Take 1 capsule (60 mg total) by mouth every morning.   Last seen 04/23/24 Next appt 10/25/24 Dispenses   Dispensed Days Supply Quantity Provider Pharmacy  LISDEXAMFETAMINE  60MG  CAP 04/16/2024 30 30 each Sater, Charlie LABOR, MD [facility] [phone] ...  LISDEXAMFETAMINE  60MG  CAP 03/02/2024 30 30 each Sater, Charlie LABOR, MD [facility] [phone] ...  LISDEXAMFETAMINE  60MG  CAP 01/20/2024 30 30 each Sater, Charlie LABOR, MD [facility] [phone] ...  LISDEXAMFETAMINE  60MG  CAP 12/19/2023 30 30 each Sater, Charlie LABOR, MD [facility] [phone] ...  LISDEXAMFETAMINE  60MG  CAP 11/16/2023 30 30 each Sater, Charlie LABOR, MD [facility] [phone] ...  LISDEXAMFETAMINE  60MG  CAP 10/09/2023 30 30 each Sater, Charlie LABOR, MD [facility] [phone] ...  LISDEXAMFETAMINE  60MG  CAP 08/25/2023 30 30 each Sater, Charlie LABOR, MD [facility] [phone] ...  LISDEXAMFETAMINE  60MG  CAP 07/22/2023 30 30 each Sater, Charlie LABOR, MD [facility] [phone] ...  LISDEXAMFETAMINE  60MG  CAP 06/15/2023 30 30 each Sater, Charlie LABOR, MD [facility] [phone] .SABRASABRA

## 2024-10-25 ENCOUNTER — Ambulatory Visit: Admitting: Neurology

## 2025-01-24 ENCOUNTER — Ambulatory Visit: Admitting: Neurology
